# Patient Record
Sex: Female | Born: 1940
Health system: Southern US, Community
[De-identification: ages and names within clinical notes are randomized; demographics above are authoritative.]

## PROBLEM LIST (undated history)

## (undated) DIAGNOSIS — K219 Gastro-esophageal reflux disease without esophagitis: Secondary | ICD-10-CM

## (undated) DIAGNOSIS — E785 Hyperlipidemia, unspecified: Secondary | ICD-10-CM

## (undated) DIAGNOSIS — R7611 Nonspecific reaction to tuberculin skin test without active tuberculosis: Secondary | ICD-10-CM

## (undated) DIAGNOSIS — I10 Essential (primary) hypertension: Secondary | ICD-10-CM

## (undated) DIAGNOSIS — M1990 Unspecified osteoarthritis, unspecified site: Secondary | ICD-10-CM

## (undated) DIAGNOSIS — M7989 Other specified soft tissue disorders: Secondary | ICD-10-CM

## (undated) DIAGNOSIS — E7800 Pure hypercholesterolemia, unspecified: Secondary | ICD-10-CM

## (undated) DIAGNOSIS — H269 Unspecified cataract: Secondary | ICD-10-CM

## (undated) DIAGNOSIS — Z87442 Personal history of urinary calculi: Secondary | ICD-10-CM

## (undated) DIAGNOSIS — Z9289 Personal history of other medical treatment: Secondary | ICD-10-CM

## (undated) DIAGNOSIS — J449 Chronic obstructive pulmonary disease, unspecified: Secondary | ICD-10-CM

## (undated) DIAGNOSIS — J4489 Other specified chronic obstructive pulmonary disease: Secondary | ICD-10-CM

## (undated) DIAGNOSIS — N189 Chronic kidney disease, unspecified: Secondary | ICD-10-CM

## (undated) DIAGNOSIS — T7840XA Allergy, unspecified, initial encounter: Secondary | ICD-10-CM

## (undated) DIAGNOSIS — R7301 Impaired fasting glucose: Secondary | ICD-10-CM

## (undated) DIAGNOSIS — K529 Noninfective gastroenteritis and colitis, unspecified: Secondary | ICD-10-CM

## (undated) DIAGNOSIS — K2270 Barrett's esophagus without dysplasia: Secondary | ICD-10-CM

## (undated) DIAGNOSIS — K635 Polyp of colon: Secondary | ICD-10-CM

## (undated) DIAGNOSIS — M549 Dorsalgia, unspecified: Secondary | ICD-10-CM

## (undated) DIAGNOSIS — Z8619 Personal history of other infectious and parasitic diseases: Secondary | ICD-10-CM

## (undated) HISTORY — DX: Dorsalgia, unspecified: M54.9

## (undated) HISTORY — DX: Personal history of other infectious and parasitic diseases: Z86.19

## (undated) HISTORY — DX: Gastro-esophageal reflux disease without esophagitis: K21.9

## (undated) HISTORY — DX: Other specified soft tissue disorders: M79.89

## (undated) HISTORY — DX: Chronic kidney disease, unspecified: N18.9

## (undated) HISTORY — DX: Polyp of colon: K63.5

## (undated) HISTORY — DX: Essential (primary) hypertension: I10

## (undated) HISTORY — DX: Impaired fasting glucose: R73.01

## (undated) HISTORY — DX: Nonspecific reaction to tuberculin skin test without active tuberculosis: R76.11

## (undated) HISTORY — DX: Personal history of urinary calculi: Z87.442

## (undated) HISTORY — DX: Unspecified osteoarthritis, unspecified site: M19.90

## (undated) HISTORY — DX: Unspecified cataract: H26.9

## (undated) HISTORY — DX: Hyperlipidemia, unspecified: E78.5

## (undated) HISTORY — DX: Allergy, unspecified, initial encounter: T78.40XA

## (undated) HISTORY — PX: FRACTURE SURGERY: SHX138

## (undated) HISTORY — DX: Other specified chronic obstructive pulmonary disease: J44.89

## (undated) HISTORY — PX: CHOLECYSTECTOMY: SHX55

## (undated) HISTORY — DX: Barrett's esophagus without dysplasia: K22.70

## (undated) HISTORY — DX: Personal history of other medical treatment: Z92.89

## (undated) HISTORY — DX: Chronic obstructive pulmonary disease, unspecified: J44.9

## (undated) HISTORY — PX: JOINT REPLACEMENT: SHX530

## (undated) HISTORY — PX: APPENDECTOMY: SHX54

## (undated) HISTORY — PX: EYE SURGERY: SHX253

## (undated) HISTORY — DX: Pure hypercholesterolemia, unspecified: E78.00

---

## 1968-05-14 HISTORY — PX: ABDOMINAL HYSTERECTOMY: SHX81

## 1998-05-10 ENCOUNTER — Other Ambulatory Visit: Admission: RE | Admit: 1998-05-10 | Discharge: 1998-05-10 | Payer: Self-pay | Admitting: Gastroenterology

## 2001-11-11 ENCOUNTER — Ambulatory Visit (HOSPITAL_COMMUNITY): Admission: RE | Admit: 2001-11-11 | Discharge: 2001-11-11 | Payer: Self-pay | Admitting: Family Medicine

## 2001-11-11 ENCOUNTER — Encounter: Payer: Self-pay | Admitting: Family Medicine

## 2004-03-24 ENCOUNTER — Ambulatory Visit: Payer: Self-pay | Admitting: Family Medicine

## 2004-04-28 ENCOUNTER — Encounter (INDEPENDENT_AMBULATORY_CARE_PROVIDER_SITE_OTHER): Payer: Self-pay | Admitting: *Deleted

## 2005-02-19 ENCOUNTER — Ambulatory Visit: Payer: Self-pay | Admitting: Family Medicine

## 2005-06-08 ENCOUNTER — Ambulatory Visit: Payer: Self-pay | Admitting: Family Medicine

## 2005-07-05 ENCOUNTER — Ambulatory Visit: Payer: Self-pay | Admitting: Family Medicine

## 2005-07-26 ENCOUNTER — Ambulatory Visit: Payer: Self-pay | Admitting: Family Medicine

## 2005-08-01 ENCOUNTER — Ambulatory Visit: Payer: Self-pay

## 2005-08-01 ENCOUNTER — Encounter (INDEPENDENT_AMBULATORY_CARE_PROVIDER_SITE_OTHER): Payer: Self-pay | Admitting: *Deleted

## 2005-08-06 ENCOUNTER — Ambulatory Visit: Payer: Self-pay | Admitting: Family Medicine

## 2005-09-18 ENCOUNTER — Ambulatory Visit: Payer: Self-pay | Admitting: Family Medicine

## 2006-01-21 ENCOUNTER — Ambulatory Visit: Payer: Self-pay | Admitting: Family Medicine

## 2006-02-05 ENCOUNTER — Ambulatory Visit: Payer: Self-pay | Admitting: Family Medicine

## 2006-02-06 ENCOUNTER — Ambulatory Visit: Payer: Self-pay | Admitting: Family Medicine

## 2006-02-19 ENCOUNTER — Ambulatory Visit: Payer: Self-pay | Admitting: Family Medicine

## 2006-03-07 ENCOUNTER — Ambulatory Visit: Payer: Self-pay | Admitting: Family Medicine

## 2006-03-19 ENCOUNTER — Ambulatory Visit: Payer: Self-pay | Admitting: Family Medicine

## 2006-04-10 LAB — CONVERTED CEMR LAB: Pap Smear: NORMAL

## 2006-04-13 HISTORY — PX: TOTAL KNEE ARTHROPLASTY: SHX125

## 2006-04-17 ENCOUNTER — Inpatient Hospital Stay (HOSPITAL_COMMUNITY): Admission: RE | Admit: 2006-04-17 | Discharge: 2006-04-20 | Payer: Self-pay | Admitting: Orthopedic Surgery

## 2006-05-10 ENCOUNTER — Ambulatory Visit: Payer: Self-pay | Admitting: Internal Medicine

## 2006-05-14 HISTORY — PX: LEG SURGERY: SHX1003

## 2006-05-31 DIAGNOSIS — Z860100 Personal history of colon polyps, unspecified: Secondary | ICD-10-CM | POA: Insufficient documentation

## 2006-05-31 DIAGNOSIS — M810 Age-related osteoporosis without current pathological fracture: Secondary | ICD-10-CM | POA: Insufficient documentation

## 2006-05-31 DIAGNOSIS — Z87442 Personal history of urinary calculi: Secondary | ICD-10-CM | POA: Insufficient documentation

## 2006-07-04 ENCOUNTER — Ambulatory Visit: Payer: Self-pay | Admitting: Family Medicine

## 2006-07-12 ENCOUNTER — Ambulatory Visit: Payer: Self-pay | Admitting: Family Medicine

## 2006-07-20 ENCOUNTER — Inpatient Hospital Stay (HOSPITAL_COMMUNITY): Admission: EM | Admit: 2006-07-20 | Discharge: 2006-07-24 | Payer: Self-pay | Admitting: Emergency Medicine

## 2006-07-25 ENCOUNTER — Inpatient Hospital Stay (HOSPITAL_COMMUNITY): Admission: EM | Admit: 2006-07-25 | Discharge: 2006-07-29 | Payer: Self-pay | Admitting: Emergency Medicine

## 2006-07-26 ENCOUNTER — Ambulatory Visit: Payer: Self-pay | Admitting: Physical Medicine & Rehabilitation

## 2006-07-29 ENCOUNTER — Inpatient Hospital Stay (HOSPITAL_COMMUNITY)
Admission: RE | Admit: 2006-07-29 | Discharge: 2006-08-07 | Payer: Self-pay | Admitting: Physical Medicine & Rehabilitation

## 2006-08-15 ENCOUNTER — Ambulatory Visit: Payer: Self-pay | Admitting: Family Medicine

## 2006-10-08 ENCOUNTER — Encounter: Payer: Self-pay | Admitting: Family Medicine

## 2006-11-21 ENCOUNTER — Ambulatory Visit: Payer: Self-pay | Admitting: Family Medicine

## 2006-12-10 ENCOUNTER — Inpatient Hospital Stay (HOSPITAL_COMMUNITY): Admission: RE | Admit: 2006-12-10 | Discharge: 2006-12-13 | Payer: Self-pay | Admitting: Orthopedic Surgery

## 2007-01-03 ENCOUNTER — Ambulatory Visit: Payer: Self-pay | Admitting: Family Medicine

## 2007-01-03 LAB — CONVERTED CEMR LAB
Bilirubin Urine: NEGATIVE
Bilirubin Urine: NEGATIVE
Ketones, ur: NEGATIVE mg/dL
Nitrite: NEGATIVE
Urine Glucose: NEGATIVE mg/dL
pH: 6 (ref 5.0–8.0)

## 2007-01-04 ENCOUNTER — Encounter (INDEPENDENT_AMBULATORY_CARE_PROVIDER_SITE_OTHER): Payer: Self-pay | Admitting: Family Medicine

## 2007-03-06 ENCOUNTER — Ambulatory Visit: Payer: Self-pay | Admitting: Internal Medicine

## 2007-03-21 ENCOUNTER — Ambulatory Visit: Payer: Self-pay | Admitting: Internal Medicine

## 2007-03-21 ENCOUNTER — Encounter: Payer: Self-pay | Admitting: Family Medicine

## 2007-04-01 ENCOUNTER — Other Ambulatory Visit: Admission: RE | Admit: 2007-04-01 | Discharge: 2007-04-01 | Payer: Self-pay | Admitting: Family Medicine

## 2007-04-01 ENCOUNTER — Ambulatory Visit: Payer: Self-pay | Admitting: Family Medicine

## 2007-04-01 ENCOUNTER — Encounter: Payer: Self-pay | Admitting: Family Medicine

## 2007-04-01 DIAGNOSIS — I10 Essential (primary) hypertension: Secondary | ICD-10-CM | POA: Insufficient documentation

## 2007-04-07 ENCOUNTER — Encounter (INDEPENDENT_AMBULATORY_CARE_PROVIDER_SITE_OTHER): Payer: Self-pay | Admitting: *Deleted

## 2007-04-14 ENCOUNTER — Telehealth (INDEPENDENT_AMBULATORY_CARE_PROVIDER_SITE_OTHER): Payer: Self-pay | Admitting: *Deleted

## 2007-04-17 ENCOUNTER — Encounter: Payer: Self-pay | Admitting: Family Medicine

## 2007-05-05 ENCOUNTER — Encounter (INDEPENDENT_AMBULATORY_CARE_PROVIDER_SITE_OTHER): Payer: Self-pay | Admitting: *Deleted

## 2007-06-12 ENCOUNTER — Telehealth (INDEPENDENT_AMBULATORY_CARE_PROVIDER_SITE_OTHER): Payer: Self-pay | Admitting: *Deleted

## 2007-06-12 ENCOUNTER — Ambulatory Visit: Payer: Self-pay | Admitting: Family Medicine

## 2007-07-09 ENCOUNTER — Ambulatory Visit: Payer: Self-pay | Admitting: Family Medicine

## 2007-07-22 ENCOUNTER — Telehealth (INDEPENDENT_AMBULATORY_CARE_PROVIDER_SITE_OTHER): Payer: Self-pay | Admitting: *Deleted

## 2007-07-22 LAB — CONVERTED CEMR LAB
AST: 22 units/L (ref 0–37)
Albumin: 3.8 g/dL (ref 3.5–5.2)
Direct LDL: 151.4 mg/dL
HDL: 46.8 mg/dL (ref >39.0)
Total Protein: 6.6 g/dL (ref 6.0–8.3)

## 2007-08-25 ENCOUNTER — Telehealth (INDEPENDENT_AMBULATORY_CARE_PROVIDER_SITE_OTHER): Payer: Self-pay | Admitting: *Deleted

## 2007-08-26 ENCOUNTER — Ambulatory Visit: Payer: Self-pay | Admitting: Family Medicine

## 2007-08-26 LAB — CONVERTED CEMR LAB: Rapid Strep: NEGATIVE

## 2007-08-27 ENCOUNTER — Encounter: Payer: Self-pay | Admitting: Family Medicine

## 2007-09-01 ENCOUNTER — Encounter (INDEPENDENT_AMBULATORY_CARE_PROVIDER_SITE_OTHER): Payer: Self-pay | Admitting: *Deleted

## 2007-09-24 ENCOUNTER — Ambulatory Visit: Payer: Self-pay | Admitting: Internal Medicine

## 2007-09-24 ENCOUNTER — Telehealth (INDEPENDENT_AMBULATORY_CARE_PROVIDER_SITE_OTHER): Payer: Self-pay | Admitting: *Deleted

## 2007-09-24 DIAGNOSIS — K219 Gastro-esophageal reflux disease without esophagitis: Secondary | ICD-10-CM | POA: Insufficient documentation

## 2007-10-09 ENCOUNTER — Ambulatory Visit: Payer: Self-pay | Admitting: Internal Medicine

## 2007-10-09 ENCOUNTER — Encounter: Payer: Self-pay | Admitting: Internal Medicine

## 2007-10-13 ENCOUNTER — Encounter: Payer: Self-pay | Admitting: Internal Medicine

## 2007-10-22 ENCOUNTER — Encounter (INDEPENDENT_AMBULATORY_CARE_PROVIDER_SITE_OTHER): Payer: Self-pay | Admitting: *Deleted

## 2007-10-22 ENCOUNTER — Ambulatory Visit: Payer: Self-pay | Admitting: Family Medicine

## 2007-10-23 ENCOUNTER — Encounter: Payer: Self-pay | Admitting: Family Medicine

## 2007-10-31 ENCOUNTER — Encounter (INDEPENDENT_AMBULATORY_CARE_PROVIDER_SITE_OTHER): Payer: Self-pay | Admitting: *Deleted

## 2008-02-02 ENCOUNTER — Ambulatory Visit: Payer: Self-pay | Admitting: Family Medicine

## 2008-02-05 ENCOUNTER — Telehealth (INDEPENDENT_AMBULATORY_CARE_PROVIDER_SITE_OTHER): Payer: Self-pay | Admitting: *Deleted

## 2008-04-01 ENCOUNTER — Ambulatory Visit: Payer: Self-pay | Admitting: *Deleted

## 2008-04-01 ENCOUNTER — Ambulatory Visit (HOSPITAL_BASED_OUTPATIENT_CLINIC_OR_DEPARTMENT_OTHER): Admission: RE | Admit: 2008-04-01 | Discharge: 2008-04-01 | Payer: Self-pay | Admitting: *Deleted

## 2008-04-01 DIAGNOSIS — E7800 Pure hypercholesterolemia, unspecified: Secondary | ICD-10-CM | POA: Insufficient documentation

## 2008-04-01 DIAGNOSIS — Z87891 Personal history of nicotine dependence: Secondary | ICD-10-CM | POA: Insufficient documentation

## 2008-04-01 DIAGNOSIS — E782 Mixed hyperlipidemia: Secondary | ICD-10-CM | POA: Insufficient documentation

## 2008-04-01 DIAGNOSIS — R32 Unspecified urinary incontinence: Secondary | ICD-10-CM | POA: Insufficient documentation

## 2008-04-01 DIAGNOSIS — K2270 Barrett's esophagus without dysplasia: Secondary | ICD-10-CM | POA: Insufficient documentation

## 2008-04-01 DIAGNOSIS — M1990 Unspecified osteoarthritis, unspecified site: Secondary | ICD-10-CM | POA: Insufficient documentation

## 2008-04-15 ENCOUNTER — Ambulatory Visit: Payer: Self-pay | Admitting: *Deleted

## 2008-04-19 ENCOUNTER — Ambulatory Visit: Payer: Self-pay | Admitting: *Deleted

## 2008-05-06 ENCOUNTER — Ambulatory Visit: Payer: Self-pay | Admitting: Internal Medicine

## 2008-05-19 ENCOUNTER — Ambulatory Visit: Payer: Self-pay | Admitting: *Deleted

## 2008-05-20 LAB — CONVERTED CEMR LAB
BUN: 12 mg/dL (ref 6–23)
CO2: 28 meq/L (ref 19–32)
Calcium: 9.6 mg/dL (ref 8.4–10.5)
Creatinine, Ser: 0.9 mg/dL (ref 0.4–1.2)
GFR calc non Af Amer: 66 mL/min
Glucose, Bld: 114 mg/dL — ABNORMAL HIGH (ref 70–99)
Sodium: 138 meq/L (ref 135–145)

## 2008-05-27 ENCOUNTER — Ambulatory Visit: Payer: Self-pay | Admitting: Critical Care Medicine

## 2008-05-27 ENCOUNTER — Encounter: Payer: Self-pay | Admitting: Critical Care Medicine

## 2008-06-10 ENCOUNTER — Ambulatory Visit (HOSPITAL_BASED_OUTPATIENT_CLINIC_OR_DEPARTMENT_OTHER): Admission: RE | Admit: 2008-06-10 | Discharge: 2008-06-10 | Payer: Self-pay | Admitting: *Deleted

## 2008-06-10 ENCOUNTER — Ambulatory Visit: Payer: Self-pay | Admitting: *Deleted

## 2008-06-10 ENCOUNTER — Ambulatory Visit: Payer: Self-pay | Admitting: Diagnostic Radiology

## 2008-06-10 LAB — CONVERTED CEMR LAB
AST: 32 units/L (ref 0–37)
BUN: 15 mg/dL (ref 6–23)
Basophils Absolute: 0.1 10*3/uL (ref 0.0–0.1)
Basophils Relative: 0.7 % (ref 0.0–3.0)
Calcium: 9.7 mg/dL (ref 8.4–10.5)
Eosinophils Absolute: 0.4 10*3/uL (ref 0.0–0.7)
GFR calc Af Amer: 64 mL/min
HDL: 60.3 mg/dL (ref >39.0)
Hemoglobin: 12.1 g/dL (ref 12.0–15.0)
MCHC: 33.4 g/dL (ref 30.0–36.0)
MCV: 86.4 fL (ref 78.0–100.0)
Monocytes Absolute: 0.6 10*3/uL (ref 0.1–1.0)
Neutro Abs: 5 10*3/uL (ref 1.4–7.7)
Neutrophils Relative %: 58.1 % (ref 43.0–77.0)
Potassium: 4.4 meq/L (ref 3.5–5.1)
Sodium: 139 meq/L (ref 135–145)
TSH: 2.25 microintl units/mL (ref 0.35–5.50)
Total Bilirubin: 0.6 mg/dL (ref 0.3–1.2)
VLDL: 11 mg/dL (ref 0–40)

## 2008-06-24 ENCOUNTER — Ambulatory Visit: Payer: Self-pay | Admitting: Critical Care Medicine

## 2008-08-05 ENCOUNTER — Ambulatory Visit: Payer: Self-pay | Admitting: Critical Care Medicine

## 2008-09-14 ENCOUNTER — Ambulatory Visit: Payer: Self-pay | Admitting: Family Medicine

## 2008-10-28 ENCOUNTER — Ambulatory Visit: Payer: Self-pay | Admitting: Critical Care Medicine

## 2008-12-14 ENCOUNTER — Ambulatory Visit: Payer: Self-pay | Admitting: Family Medicine

## 2008-12-15 ENCOUNTER — Encounter: Payer: Self-pay | Admitting: Family Medicine

## 2009-03-28 ENCOUNTER — Ambulatory Visit: Payer: Self-pay | Admitting: Family

## 2009-03-30 ENCOUNTER — Telehealth: Payer: Self-pay | Admitting: Family Medicine

## 2009-03-31 ENCOUNTER — Encounter (INDEPENDENT_AMBULATORY_CARE_PROVIDER_SITE_OTHER): Payer: Self-pay | Admitting: *Deleted

## 2009-06-21 ENCOUNTER — Ambulatory Visit: Payer: Self-pay | Admitting: Family

## 2009-06-21 ENCOUNTER — Ambulatory Visit (HOSPITAL_BASED_OUTPATIENT_CLINIC_OR_DEPARTMENT_OTHER): Admission: RE | Admit: 2009-06-21 | Discharge: 2009-06-21 | Payer: Self-pay | Admitting: Internal Medicine

## 2009-06-21 ENCOUNTER — Ambulatory Visit: Payer: Self-pay | Admitting: Diagnostic Radiology

## 2009-06-22 ENCOUNTER — Telehealth (INDEPENDENT_AMBULATORY_CARE_PROVIDER_SITE_OTHER): Payer: Self-pay | Admitting: *Deleted

## 2009-06-23 ENCOUNTER — Ambulatory Visit: Payer: Self-pay | Admitting: Family

## 2009-06-23 LAB — CONVERTED CEMR LAB
Basophils Relative: 0 % (ref 0–1)
CO2: 25 meq/L (ref 19–32)
Eosinophils Relative: 7 % — ABNORMAL HIGH (ref 0–5)
Glucose, Bld: 103 mg/dL — ABNORMAL HIGH (ref 70–99)
Hgb A1c MFr Bld: 6.8 % — ABNORMAL HIGH (ref 4.6–6.1)
LDL Cholesterol: 201 mg/dL — ABNORMAL HIGH (ref 0–99)
Lymphs Abs: 3 10*3/uL (ref 0.7–4.0)
MCV: 83.5 fL (ref 78.0–100.0)
Monocytes Relative: 7 % (ref 3–12)
Neutro Abs: 6.4 10*3/uL (ref 1.7–7.7)
Neutrophils Relative %: 59 % (ref 43–77)
Platelets: 542 10*3/uL — ABNORMAL HIGH (ref 150–400)
Potassium: 4.4 meq/L (ref 3.5–5.3)
RDW: 14.1 % (ref 11.5–15.5)
Sodium: 138 meq/L (ref 135–145)
Total CHOL/HDL Ratio: 5.7
VLDL: 33 mg/dL (ref 0–40)
WBC: 10.9 10*3/uL — ABNORMAL HIGH (ref 4.0–10.5)

## 2009-06-25 ENCOUNTER — Encounter: Payer: Self-pay | Admitting: Family

## 2009-07-05 ENCOUNTER — Ambulatory Visit: Payer: Self-pay | Admitting: Family

## 2009-07-07 ENCOUNTER — Ambulatory Visit (HOSPITAL_BASED_OUTPATIENT_CLINIC_OR_DEPARTMENT_OTHER): Admission: RE | Admit: 2009-07-07 | Discharge: 2009-07-07 | Payer: Self-pay | Admitting: Internal Medicine

## 2009-07-07 ENCOUNTER — Ambulatory Visit: Payer: Self-pay | Admitting: Diagnostic Radiology

## 2009-07-07 ENCOUNTER — Telehealth: Payer: Self-pay | Admitting: Family

## 2009-07-18 ENCOUNTER — Encounter: Payer: Self-pay | Admitting: Internal Medicine

## 2009-07-18 ENCOUNTER — Ambulatory Visit: Payer: Self-pay | Admitting: Internal Medicine

## 2009-08-25 ENCOUNTER — Telehealth (INDEPENDENT_AMBULATORY_CARE_PROVIDER_SITE_OTHER): Payer: Self-pay | Admitting: *Deleted

## 2009-10-17 ENCOUNTER — Ambulatory Visit: Payer: Self-pay | Admitting: Family

## 2009-10-18 ENCOUNTER — Encounter: Payer: Self-pay | Admitting: Family

## 2009-10-18 LAB — CONVERTED CEMR LAB
CO2: 23 meq/L (ref 19–32)
Calcium: 9 mg/dL (ref 8.4–10.5)
Creatinine, Ser: 0.74 mg/dL (ref 0.40–1.20)
Glucose, Bld: 101 mg/dL — ABNORMAL HIGH (ref 70–99)
Sodium: 136 meq/L (ref 135–145)
Triglycerides: 188 mg/dL — ABNORMAL HIGH (ref <150)

## 2009-10-19 ENCOUNTER — Telehealth: Payer: Self-pay | Admitting: Family

## 2009-10-20 ENCOUNTER — Telehealth: Payer: Self-pay | Admitting: Family

## 2010-02-28 ENCOUNTER — Encounter: Payer: Self-pay | Admitting: Family

## 2010-03-27 ENCOUNTER — Ambulatory Visit: Payer: Self-pay | Admitting: Family

## 2010-03-27 LAB — CONVERTED CEMR LAB
BUN: 10 mg/dL (ref 6–23)
Sodium: 135 meq/L (ref 135–145)

## 2010-03-28 ENCOUNTER — Encounter: Payer: Self-pay | Admitting: Family

## 2010-04-05 ENCOUNTER — Telehealth: Payer: Self-pay | Admitting: Family

## 2010-04-05 LAB — CONVERTED CEMR LAB
Cholesterol: 331 mg/dL — ABNORMAL HIGH (ref 0–200)
HDL: 62 mg/dL (ref >39)

## 2010-04-10 ENCOUNTER — Telehealth: Payer: Self-pay | Admitting: Family

## 2010-05-16 ENCOUNTER — Telehealth (INDEPENDENT_AMBULATORY_CARE_PROVIDER_SITE_OTHER): Payer: Self-pay | Admitting: *Deleted

## 2010-05-23 ENCOUNTER — Encounter: Payer: Self-pay | Admitting: Internal Medicine

## 2010-05-31 ENCOUNTER — Telehealth: Payer: Self-pay | Admitting: Family

## 2010-06-02 ENCOUNTER — Encounter: Payer: Self-pay | Admitting: Family

## 2010-06-02 ENCOUNTER — Encounter: Payer: Self-pay | Admitting: Internal Medicine

## 2010-06-11 LAB — CONVERTED CEMR LAB
ALT: 18 units/L (ref 0–35)
Albumin: 3.9 g/dL (ref 3.5–5.2)
Albumin: 4.2 g/dL (ref 3.5–5.2)
Alkaline Phosphatase: 75 units/L (ref 39–117)
BUN: 7 mg/dL (ref 6–23)
Basophils Absolute: 0.1 10*3/uL (ref 0.0–0.1)
Bilirubin Urine: NEGATIVE
Blood in Urine, dipstick: NEGATIVE
CO2: 27 meq/L (ref 19–32)
Calcium: 9.3 mg/dL (ref 8.4–10.5)
Chloride: 100 meq/L (ref 96–112)
Direct LDL: 225.7 mg/dL
Eosinophils Absolute: 0.2 10*3/uL (ref 0.0–0.6)
Eosinophils Relative: 3.1 % (ref 0.0–5.0)
GFR calc Af Amer: 129 mL/min
Glucose, Bld: 102 mg/dL — ABNORMAL HIGH (ref 70–99)
HDL: 56.5 mg/dL (ref >39.0)
Lymphocytes Relative: 31.8 % (ref 12.0–46.0)
MCHC: 34 g/dL (ref 30.0–36.0)
Monocytes Absolute: 0.4 10*3/uL (ref 0.2–0.7)
Neutrophils Relative %: 59.3 % (ref 43.0–77.0)
Protein, U semiquant: NEGATIVE
RBC: 4.12 M/uL (ref 3.87–5.11)
Total Bilirubin: 0.6 mg/dL (ref 0.3–1.2)
Total Bilirubin: 0.6 mg/dL (ref 0.3–1.2)
WBC: 7.6 10*3/uL (ref 4.5–10.5)

## 2010-06-13 NOTE — Assessment & Plan Note (Signed)
Summary: HURT FOOT/MHF   Vital Signs:  Patient profile:   70 year old female Weight:      172 pounds BMI:     31.57 O2 Sat:      97 % on Room air Temp:     98.0 degrees F oral Pulse rate:   102 / minute Pulse rhythm:   regular Resp:     18 per minute BP sitting:   122 / 80  (right arm) Cuff size:   large  Vitals Entered By: Glendell Docker CMA (June 21, 2009 1:32 PM)  O2 Flow:  Room air  Primary Care Provider:  Seymour Bars DO  CC:  Right Foot Pain.  History of Present Illness: c/o right foot pain for the past 2 weeks, she tripped and fell in her garage. She notes that she stubbed her toe during this fall but denies knowingly injuring her right foot,.She states it is a sharp pain when she walks, and when pressure is relieved there is a throbbing sensation.  Notes that pain has worsened each day.  She notes that her foot swelled over the weekend, but not now.  Also has used some redness on the top of her foot.  She used ibuprofen which has helped her pain.  Contraindications/Deferment of Procedures/Staging:    Test/Procedure: PAP Smear    Reason for deferment: hysterectomy   Allergies: 1)  ! Crestor (Rosuvastatin Calcium) 2)  ! Lipitor (Atorvastatin Calcium) 3)  ! * Tb Test 4)  ! Arthrotec 50 (Diclofenac-Misoprostol) 5)  ! Sulfa 6)  ! * Statins 7)  ! Tricor (Fenofibrate) 8)  Augmentin (Amoxicillin-Pot Clavulanate) 9)  Penicillin G Potassium (Penicillin G Potassium)  Physical Exam  General:  Well-developed,well-nourished,in no acute distress; alert,appropriate and cooperative throughout examination Extremities:  right foot tender to the touch at base of 2nd and 3rd toes.  mild associated erythema, no significant swelling noted.  No increased pain with rom of ankle or right great toe   Impression & Recommendations:  Problem # 1:  FOOT PAIN, RIGHT (ICD-729.5) x-ray negative for acute fracture, but dose note old 4th metatarsal fracture,  plan NSAIDS and will give  empiric trial of colchicine for possible gout.   Left message on patient's machine with her permission RE: x-ray results and colchicine plans. Orders: T-Foot Right ([id number]) Prescription Created Electronically [phone])  Complete Medication List: 1)  Quinapril-hydrochlorothiazide 20-25 Mg Tabs (Quinapril-hydrochlorothiazide) .... Take 1 tablet by mouth once a day 2)  Potassium Chloride Cr 10 Meq Cpcr (Potassium chloride) .... Take 1 tablet by mouth once a day 3)  Omeprazole 20 Mg Tbec (Omeprazole) .[name].. 1 by mouth two times a day 4)  Bayer Aspirin 325 Mg Tabs (Aspirin) .[name].. 1 by mouth once daily 5)  Ibuprofen 200 Mg Caps (Ibuprofen) .... Take 1 tab as needed for pain 6)  Vitamin E 400 Unit Caps (Vitamin e) .... Take 1 capsule by mouth once a day 7)  Calcium 600 Mg Tabs (Calcium) .... Take 1 tablet by mouth two times a day 8)  Multivitamins Tabs (Multiple vitamin) .... Take 1 tablet by mouth once a day 9)  Ra Beta Carotene 15 Mg Caps (Beta carotene) .... Take 1 capsule by mouth once a day 10)  Vitamin C 1000 Mg Tabs (Ascorbic acid) .... Take 1 tablet by mouth once a day 11)  Co Q-10 200 Mg Caps (Coenzyme q10) .... Take 1 tablet by mouth once a day 12)  Fish Oil Oil (Fish oil) .... Take  1 tablet by mouth once a day 13)  Calcitonin (salmon) 200 Unit/act Soln (Calcitonin (salmon)) .[name].. 1 spray once daily - alternate nostrils 14)  Fluticasone Propionate 50 Mcg/act Susp (Fluticasone propionate) .... Two sprays each nostril once daily 15)  Colchicine 0.6 Mg Tabs (Colchicine) .... One tab by mouth two times a day for 1-2 weeks or until pain is improved  Patient Instructions: 1)  Complete your x-ray downstairs today 2)  Take 400-600mg  of Ibuprofen (Advil, Motrin) with food every 4-6 hours as needed for relief of pain or comfort of fever. 3)  Please make an appointment for a complete physical. 4)  Please return fasting for the following labs prior to your physical: 5)  CBC v70 6)  BMET 790.21 7)  a1c  790.21 8)  FLP 272 Prescriptions: COLCHICINE 0.6 MG TABS (COLCHICINE) one tab by mouth two times a day for 1-2 weeks or until pain is improved  #24 x 0   Entered and Authorized by:   Lemont Fillers FNP   Signed by:   Lemont Fillers FNP on 06/21/2009   Method used:   Electronically to        [facility] [phone]* (retail)       [address]       [place], [place]  [address]       Ph: [phone]       Fax: [phone]   RxID:   [id number]   Current Allergies (reviewed today): ! CRESTOR (ROSUVASTATIN CALCIUM) ! LIPITOR (ATORVASTATIN CALCIUM) ! * TB TEST ! ARTHROTEC 50 (DICLOFENAC-MISOPROSTOL) ! SULFA ! * STATINS ! TRICOR (FENOFIBRATE) AUGMENTIN (AMOXICILLIN-POT CLAVULANATE) PENICILLIN G POTASSIUM (PENICILLIN G POTASSIUM)   Immunization History:  Influenza Immunization History:    Influenza:  historical (01/31/2009)    Preventive Care Screening  Pap Smear:    Date:  06/21/2009    Results:  Declined  Last Flu Shot:    Date:  01/31/2009    Results:  Historical

## 2010-06-13 NOTE — Letter (Signed)
   [place] at [facility] [address] [place], [place]  [address]  [place] Phone: [phone]      June 25, 2009   [facility] [address] [place], [place] [address]  RE:  LAB RESULTS  Dear  Ms. Hudon,  The following is an interpretation of your most recent lab tests.  Please take note of any instructions provided or changes to medications that have resulted from your lab work.  ELECTROLYTES:  Good - no changes needed  KIDNEY FUNCTION TESTS:  Good - no changes needed  LIVER FUNCTION TESTS:  Good - no changes needed  LIPID PANEL:  Abnormal - schedule a follow-up appointment Triglyceride: 166   Cholesterol: 284   LDL: 201   HDL: 50   Chol/HDL%:  5.7 Ratio  THYROID STUDIES:  Thyroid studies normal TSH: 2.25     DIABETIC STUDIES:  Fair - schedule a follow-up appointment Blood Glucose: 103   HgbA1C: 6.8     CBC:  Fair - review at your next visit  Please keep your upcoming appointment- we can discuss the above results at your visit.   Sincerely Yours,    Lemont Fillers FNP

## 2010-06-13 NOTE — Letter (Signed)
   [place] at [facility] [address] [place], [place]  [address]  [place] Phone: [phone]      March 28, 2010   [facility] [address] [place], [place] [address]  RE:  LAB RESULTS  Dear  Ms. Barnette,  The following is an interpretation of your most recent lab tests.  Please take note of any instructions provided or changes to medications that have resulted from your lab work.  ELECTROLYTES:  Good - no changes needed  KIDNEY FUNCTION TESTS:  Good - no changes needed    DIABETIC STUDIES:  Good - no changes needed Blood Glucose: 99   HgbA1C: 6.1      Sincerely Yours,    Lemont Fillers FNP  Appended Document:  Mailed.

## 2010-06-13 NOTE — Progress Notes (Signed)
Summary: Niacin  Phone Note Outgoing Call   Summary of Call: Pls let patient know that I have sent Rx to pharmacy. Initial call taken by: Lemont Fillers FNP,  October 20, 2009 8:06 AM  Follow-up for Phone Call        Left detailed message on home phone re: rx per HIPPA auth. Advised pt. to call if any questions.  Nicki Guadalajara Fergerson CMA  October 20, 2009 10:21 AM     New/Updated Medications: NIACIN 500 MG TABS (NIACIN) one tablet by mouth at bedtime x 1 week, then 2 tabs at bedtime second week, then 3 tabs at bedtime starting on third week. Prescriptions: NIACIN 500 MG TABS (NIACIN) one tablet by mouth at bedtime x 1 week, then 2 tabs at bedtime second week, then 3 tabs at bedtime starting on third week.  #90 x 1   Entered and Authorized by:   Lemont Fillers FNP   Signed by:   Lemont Fillers FNP on 10/20/2009   Method used:   [facility]ronically to        [facility] [phone]* (retail)       [address]       [place], [place]  [address]       Ph: [phone]       Fax: [phone]   RxID:   [id number]

## 2010-06-13 NOTE — Miscellaneous (Signed)
Summary: BONE DENSITY  Clinical Lists Changes  Orders: Added new Test order of T-Bone Densitometry (77080) - Signed Added new Test order of T-Lumbar Vertebral Assessment (77082) - Signed 

## 2010-06-13 NOTE — Progress Notes (Signed)
  Phone Note Other Incoming   Caller: Patient Details for Reason: Return call. Summary of Call: Spoke with patient, reviewed elevated cholesterol.  She reports that she is now taking second dose of welchol and continues niacin.  Will give trial of zetia.  Pt in donut hole, will leave samples for pt to pick up.  She is aware. lot [id number] exp 06/13 Initial call taken by: Lemont Fillers FNP,  April 10, 2010 3:42 PM    New/Updated Medications: ZETIA 10 MG TABS (EZETIMIBE) one tablet by mouth once daily Prescriptions: ZETIA 10 MG TABS (EZETIMIBE) one tablet by mouth once daily  #42 x 0   Entered and Authorized by:   Lemont Fillers FNP   Signed by:   Lemont Fillers FNP on 04/10/2010   Method used:   Samples Given   RxID:   [id number]

## 2010-06-13 NOTE — Assessment & Plan Note (Signed)
Summary: 3 MONTH FU RX REFILLS/DT--rm  5   Vital Signs:  Patient profile:   70 year old female Height:      62 inches Weight:      167.50 pounds BMI:     30.75 Temp:     98.1 degrees F oral Pulse rate:   90 / minute Pulse rhythm:   regular Resp:     16 per minute BP sitting:   142 / 84  (right arm) Cuff size:   regular  Vitals Entered By: Mervin Kung CMA (October 17, 2009 1:24 PM) CC: room 5  3 month follow up. Pt states she only takes Omeprazole once a day. Needs refills sent to mail order for Fluticasone, Quinapril-hctz, Potassium and Welchol.  Would like local rx for Calcitonin. Is Patient Diabetic? No   Primary Care Provider:  Seymour Bars DO  CC:  room 5  3 month follow up. Pt states she only takes Omeprazole once a day. Needs refills sent to mail order for Fluticasone, Quinapril-hctz, and Potassium and Welchol.  Would like local rx for Calcitonin.[name]  History of Present Illness: [name] is a 70 year old female who presents today for follow up.  She has no specific complaints although  she does note some + weight gain.    Hyperlipidemia- she reports that she is tolerating Wellbutrin without difficulty.  HTN- reports + compliance with meds.  She denies swelling  Allergies: 1)  ! Crestor (Rosuvastatin Calcium) 2)  ! Lipitor (Atorvastatin Calcium) 3)  ! * Tb Test 4)  ! Arthrotec 50 (Diclofenac-Misoprostol) 5)  ! Sulfa 6)  ! * Statins 7)  ! Tricor (Fenofibrate) 8)  ! * Pcn 9)  Augmentin (Amoxicillin-Pot Clavulanate) 10)  Penicillin G Potassium (Penicillin G Potassium)  Past History:  Past Medical History: Last updated: 09/14/2008 Colonic polyps, hx of Nephrolithiasis, hx of Osteoporosis - patient cannot tolerate fosamax Hypertension (03/2003) Hyperlipidemia (03/2003) chicken pox postivie TB skin test   -exposure to 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  Pos PPD only  neg CXR blood transfusion Urinary incontinence Osteoarthritis barret's esophagus with GERD chronic  obstructive bronchitis (Dr Delford Field) IFG  Past Surgical History: Last updated: 05/06/2008 Hysterectomy (1970) Appendectomy Cholecystectomy Total left knee replacement 04/2006 right leg 2008   Family History: Last updated: 05/27/2008 Family History of CAD Female 1st degree relative <60 Family History of CAD Female 1st degree relative <50 Family History High cholesterol Family History Hypertension Family History of  arthritis Family History Asthma grandmother and mother Family History Tuberculosis grandmother only   Social History: Last updated: 05/06/2008 Retired--[employer] widow Former Smoker quit in 1969 Alcohol use-no Drug use-no Regular exercise-yes   Risk Factors: Caffeine Use: 0 (04/01/2007) Exercise: yes (04/01/2007)  Risk Factors: Smoking Status: quit (07/05/2009) Passive Smoke Exposure: yes (05/27/2008)  Physical Exam  General:  Well-developed,well-nourished,in no acute distress; alert,appropriate and cooperative throughout examination Head:  Normocephalic and atraumatic without obvious abnormalities. No apparent alopecia or balding. Lungs:  Normal respiratory effort, chest expands symmetrically. Lungs are clear to auscultation, no crackles or wheezes. Heart:  Normal rate and regular rhythm. S1 and S2 normal without gallop, murmur, click, rub or other extra sounds. Extremities:  No clubbing, cyanosis, edema, or deformity noted with normal full range of motion of all joints.     Impression & Recommendations:  Problem # 1:  IMPAIRED FASTING GLUCOSE (ICD-790.21) Assessment Comment Only Will check A1C to r/o DM Future Orders: T- Hemoglobin A1C ([id number]) ... 10/18/2009  Problem # 2:  OSTEOPOROSIS (ICD-733.00) Assessment: Improved Patient's Dexa noted only osteopenia- which according to patient is improved.  Old records are unavailable for review.  Plan to continue calcitonin and will also check a vitamin D level. Her updated medication list  for this problem includes:    Calcitonin (salmon) 200 Unit/act Soln (Calcitonin (salmon)) .[name]... 1 spray once daily - alternate nostrils  Orders: T-Assay of Vitamin D ([id number])  Problem # 3:  HYPERCHOLESTEROLEMIA (ICD-272.0) Patient to return fasting for FLP Her updated medication list for this problem includes:    Welchol 625 Mg Tabs (Colesevelam hcl) .Marland KitchenMarland KitchenMarland KitchenMarland Kitchen 3 tabs by mouth two times a day  Future Orders: T-Lipid Profile ([id number]) ... 10/18/2009  Complete Medication List: 1)  Quinapril-hydrochlorothiazide 20-25 Mg Tabs (Quinapril-hydrochlorothiazide) .... Take 1 tablet by mouth once a day 2)  Potassium Chloride Cr 10 Meq Cpcr (Potassium chloride) .... Take 1 tablet by mouth once a day 3)  Omeprazole 20 Mg Tbec (Omeprazole) .[name].. 1 by mouth two times a day 4)  Bayer Aspirin 325 Mg Tabs (Aspirin) .[name].. 1 by mouth once daily 5)  Ibuprofen 200 Mg Caps (Ibuprofen) .... Take 1 tab as needed for pain 6)  Vitamin E 400 Unit Caps (Vitamin e) .... Take 1 capsule by mouth once a day 7)  Calcium 600 Mg Tabs (Calcium) .... Take 1 tablet by mouth two times a day 8)  Multivitamins Tabs (Multiple vitamin) .... Take 1 tablet by mouth once a day 9)  Ra Beta Carotene 15 Mg Caps (Beta carotene) .... Take 1 capsule by mouth once a day 10)  Vitamin C 1000 Mg Tabs (Ascorbic acid) .... Take 1 tablet by mouth once a day 11)  Co Q-10 200 Mg Caps (Coenzyme q10) .... Take 1 tablet by mouth once a day 12)  Fish Oil Oil (Fish oil) .... Take 1 tablet by mouth once a day 13)  Calcitonin (salmon) 200 Unit/act Soln (Calcitonin (salmon)) .[name].. 1 spray once daily - alternate nostrils 14)  Fluticasone Propionate 50 Mcg/act Susp (Fluticasone propionate) .... Two sprays each nostril once daily 15)  Welchol 625 Mg Tabs (Colesevelam hcl) .... 3 tabs by mouth two times a day 16)  Magnesium 500 Mg Tabs (Magnesium) .... One tablet by mouth daily 17)  Align Caps (Probiotic product) .... One cap by mouth daily  Other  Orders: Future Orders: T-Basic Metabolic Panel [id number]) ... 10/18/2009  Patient Instructions: 1)  Please return fasting for the following labs: BMET (401.9),  Fasting lipid profile (272.0) and A1C (790.21).   2)  Follow up in 3 months. 3)  Have a nice Summer! Prescriptions: CALCITONIN (SALMON) 200 UNIT/ACT SOLN (CALCITONIN (SALMON)) 1 spray once daily - alternate nostrils  #1 x 5   Entered and Authorized by:   Lemont Fillers FNP   Signed by:   Lemont Fillers FNP on 10/17/2009   Method used:   [facility] to        [facility] [phone]* (retail)       [address]       [place], [place]  [address]       Ph: [phone]       Fax: [phone]   RxID:   [id number] WELCHOL 625 MG TABS (COLESEVELAM HCL) 3 tabs by mouth two times a day  #180 Tablet x 1   Entered and Authorized by:   Lemont Fillers FNP   Signed by:   Lemont Fillers FNP on 10/17/2009  Method used:   Print then Give to Patient   RxID:   [id number] FLUTICASONE PROPIONATE 50 MCG/ACT SUSP (FLUTICASONE PROPIONATE) two sprays each nostril once daily  #3 x 1   Entered and Authorized by:   Lemont Fillers FNP   Signed by:   Lemont Fillers FNP on 10/17/2009   Method used:   Print then Give to Patient   RxID:   [id number] OMEPRAZOLE 20 MG  TBEC (OMEPRAZOLE) 1 by mouth two times a day  #180 x 1   Entered and Authorized by:   Lemont Fillers FNP   Signed by:   Lemont Fillers FNP on 10/17/2009   Method used:   Print then Give to Patient   RxID:   [id number] POTASSIUM CHLORIDE CR 10 MEQ  CPCR (POTASSIUM CHLORIDE) Take 1 tablet by mouth once a day  #90 x 1   Entered and Authorized by:   Lemont Fillers FNP   Signed by:   Lemont Fillers FNP on 10/17/2009   Method used:   Print then Give to Patient   RxID:   [id number] QUINAPRIL-HYDROCHLOROTHIAZIDE 20-25 MG TABS (QUINAPRIL-HYDROCHLOROTHIAZIDE) Take 1 tablet by  mouth once a day  #90 x 1   Entered and Authorized by:   Lemont Fillers FNP   Signed by:   Lemont Fillers FNP on 10/17/2009   Method used:   Print then Give to Patient   RxID:   [id number]   Current Allergies (reviewed today): ! CRESTOR (ROSUVASTATIN CALCIUM) ! LIPITOR (ATORVASTATIN CALCIUM) ! * TB TEST ! ARTHROTEC 50 (DICLOFENAC-MISOPROSTOL) ! SULFA ! * STATINS ! TRICOR (FENOFIBRATE) ! * PCN AUGMENTIN (AMOXICILLIN-POT CLAVULANATE) PENICILLIN G POTASSIUM (PENICILLIN G POTASSIUM)

## 2010-06-13 NOTE — Progress Notes (Signed)
Summary: HEDIS records request  Phone Note Other Incoming   Request: Send information Summary of Call: Request for records recieved from [facility]. Request forwarded to [employer].    Received HEDIS request. Processed and mailed 08/31/09. Rene Kocher Flowers  August 31, 2009 11:05 AM

## 2010-06-13 NOTE — Letter (Signed)
Summary: Generic Letter  [place] at [facility]  [address]   [place], [place] [address]   Phone: [phone]  Fax: [phone]    02/28/2010  [facility] [address] [place], [place]  [address]  Dear Ms. Morad,   I recently reviewed your chart and see that you are due for a follow up appointment.  Please call our office at your earliest convenience to schedule this appointment.  I hope that you are well.     Sincerely,   Sandford Craze FNP  Appended Document: Generic Letter Mailed.

## 2010-06-13 NOTE — Assessment & Plan Note (Signed)
Summary: cpx/mhf   Vital Signs:  Patient profile:   70 year old female Weight:      176 pounds BMI:     32.31 Temp:     97.8 degrees F oral Pulse rate:   108 / minute Pulse rhythm:   regular Resp:     16 per minute BP sitting:   134 / 78  (right arm) Cuff size:   large  Vitals Entered By: Pearletha Furl CMA (July 05, 2009 3:00 PM) CC: room 4 Annual physical Is Patient Diabetic? No   Primary Care Provider:  Seymour Bars DO  CC:  room 4 Annual physical.  History of Present Illness: [name] is a 70 year old female who presents today for her yearly exam.  Notes since her last visit here she has seen orthopedics for her right foot and was diagnosed with a stress fracture  Preventatitve- notes last dexa was two years ago.  declines Pap, due for mammogram.  Notes she exercises regulary does resistance training.  walks. Offered zostavax- declines.  HTN- does not watch sodium very carefully.    Osteoporosis- new stress fracture, due for dexa.     GERD/Barrett's esophagus- notes that her symptoms are diet related, feels like weight gain worsenes her symptoms.      Preventive Screening-Counseling & Management  Alcohol-Tobacco     Smoking Status: quit  Allergies: 1)  ! Crestor (Rosuvastatin Calcium) 2)  ! Lipitor (Atorvastatin Calcium) 3)  ! * Tb Test 4)  ! Arthrotec 50 (Diclofenac-Misoprostol) 5)  ! Sulfa 6)  ! * Statins 7)  ! Tricor (Fenofibrate) 8)  ! * Pcn 9)  Augmentin (Amoxicillin-Pot Clavulanate) 10)  Penicillin G Potassium (Penicillin G Potassium)  Past History:  Past Medical History: Last updated: 09/14/2008 Colonic polyps, hx of Nephrolithiasis, hx of Osteoporosis - patient cannot tolerate fosamax Hypertension (03/2003) Hyperlipidemia (03/2003) chicken pox postivie TB skin test   -exposure to 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  Pos PPD only  neg CXR blood transfusion Urinary incontinence Osteoarthritis barret's esophagus with GERD chronic obstructive  bronchitis (Dr Delford Field) [facility]  Past Surgical History: Last updated: 05/06/2008 Hysterectomy (1970) Appendectomy Cholecystectomy Total left knee replacement 04/2006 right leg 2008   Family History: Last updated: 05/27/2008 Family History of CAD Female 1st degree relative <60 Family History of CAD Female 1st degree relative <50 Family History High cholesterol Family History Hypertension Family History of  arthritis Family History Asthma grandmother and mother Family History Tuberculosis grandmother only   Social History: Last updated: 05/06/2008 Retired--[employer] widow Former Smoker quit in 1969 Alcohol use-no Drug use-no Regular exercise-yes   Risk Factors: Caffeine Use: 0 (04/01/2007) Exercise: yes (04/01/2007)  Risk Factors: Smoking Status: quit (07/05/2009) Passive Smoke Exposure: yes (05/27/2008)  Review of Systems       Constitutional: Denies Fever ENT:  Denies nasal congestion or sore throat. Resp: Denies cough CV:  Denies Chest Pain or SOB GI:  Denies nausea or vomitting GU: Denies dysuria Lymphatic: Denies lymphadenopathy Musculoskeletal:  Denies muscle/joint pain, + right foot pain Skin:  Denies Rashes Psychiatric: Denies depression Neuro: Denies numbness     Physical Exam  General:  Well-developed,well-nourished,in no acute distress; alert,appropriate and cooperative throughout examination Head:  Normocephalic and atraumatic without obvious abnormalities. No apparent alopecia or balding. Eyes:  Perrla Ears:  External ear exam shows no significant lesions or deformities.  Otoscopic examination reveals clear canals, tympanic membranes are intact bilaterally without bulging, retraction, inflammation or discharge. Hearing is grossly normal bilaterally. Mouth:  Oral mucosa and oropharynx without lesions or exudates.  Teeth in good repair. Neck:  No deformities, masses, or tenderness noted. Breasts:  declined Lungs:  Normal respiratory  effort, chest expands symmetrically. Lungs are clear to auscultation, no crackles or wheezes. Heart:  Normal rate and regular rhythm. S1 and S2 normal without gallop, murmur, click, rub or other extra sounds. Abdomen:  Bowel sounds positive,abdomen soft and non-tender without masses, organomegaly or hernias noted. Genitalia:  declined Msk:  No deformity or scoliosis noted of thoracic or lumbar spine.   Neurologic:  alert & oriented X3 and strength normal in all extremities.   Skin:  small area of hyperpigmentation on left thigh Cervical Nodes:  No lymphadenopathy noted Psych:  Cognition and judgment appear intact. Alert and cooperative with normal attention span and concentration. No apparent delusions, illusions, hallucinations    Impression & Recommendations:  Problem # 1:  IMPAIRED FASTING GLUCOSE (ICD-790.21) glucose slightly elevated,  declines A1C.  Patient advised to work hard on diet and exercise.    Problem # 2:  HYPERCHOLESTEROLEMIA (ICD-272.0) Assessment: Comment Only  intolerant to statins and fibrates.  Will try Welchol  Her updated medication list for this problem includes:    Welchol 625 Mg Tabs (Colesevelam hcl) .Marland KitchenMarland KitchenMarland KitchenMarland Kitchen 3 tabs by mouth two times a day  Problem # 3:  BARRETTS ESOPHAGUS (ICD-530.85) Assessment: Unchanged Stable continue PPI  Problem # 4:  OSTEOPOROSIS (ICD-733.00) Assessment: Comment Only Plan f/u dexa.  Patient had recent stress fracture in her right foot.   Her updated medication list for this problem includes:    Calcitonin (salmon) 200 Unit/act Soln (Calcitonin (salmon)) .[name]... 1 spray once daily - alternate nostrils  Complete Medication List: 1)  Quinapril-hydrochlorothiazide 20-25 Mg Tabs (Quinapril-hydrochlorothiazide) .... Take 1 tablet by mouth once a day 2)  Potassium Chloride Cr 10 Meq Cpcr (Potassium chloride) .... Take 1 tablet by mouth once a day 3)  Omeprazole 20 Mg Tbec (Omeprazole) .[name].. 1 by mouth two times a day 4)  Bayer Aspirin  325 Mg Tabs (Aspirin) .[name].. 1 by mouth once daily 5)  Ibuprofen 200 Mg Caps (Ibuprofen) .... Take 1 tab as needed for pain 6)  Vitamin E 400 Unit Caps (Vitamin e) .... Take 1 capsule by mouth once a day 7)  Calcium 600 Mg Tabs (Calcium) .... Take 1 tablet by mouth two times a day 8)  Multivitamins Tabs (Multiple vitamin) .... Take 1 tablet by mouth once a day 9)  Ra Beta Carotene 15 Mg Caps (Beta carotene) .... Take 1 capsule by mouth once a day 10)  Vitamin C 1000 Mg Tabs (Ascorbic acid) .... Take 1 tablet by mouth once a day 11)  Co Q-10 200 Mg Caps (Coenzyme q10) .... Take 1 tablet by mouth once a day 12)  Fish Oil Oil (Fish oil) .... Take 1 tablet by mouth once a day 13)  Calcitonin (salmon) 200 Unit/act Soln (Calcitonin (salmon)) .[name].. 1 spray once daily - alternate nostrils 14)  Fluticasone Propionate 50 Mcg/act Susp (Fluticasone propionate) .... Two sprays each nostril once daily 15)  Welchol 625 Mg Tabs (Colesevelam hcl) .... 3 tabs by mouth two times a day  Other Orders: Mammogram (Screening) (Mammo)  Patient Instructions: 1)  Please schedule your bone scan prior to leaving-(ICD-9 733.00) 2)  It is important that you exercise regularly at least 20 minutes 5 times a week. If you develop chest pain, have severe difficulty breathing, or feel very tired , stop exercising immediately and seek medical attention.  3)  You need to lose weight. Consider a lower calorie diet and regular exercise.  4)  Please follow up in 6 months. 5)  Complete your mammogram. Prescriptions: WELCHOL 625 MG TABS (COLESEVELAM HCL) 3 tabs by mouth two times a day  #180 x 2   Entered and Authorized by:   Lemont Fillers FNP   Signed by:   Lemont Fillers FNP on 07/07/2009   Method used:   [facility]        [facility] [phone][facility] (retail)       [address]       [place], [place]  [address]       Ph: [phone]       Fax: [phone]   RxID:    [id number] CALCITONIN (SALMON) 200 UNIT/ACT SOLN (CALCITONIN (SALMON)) 1 spray once daily - alternate nostrils  #1 x 5   Entered and Authorized by:   Lemont Fillers FNP   Signed by:   Lemont Fillers FNP on 07/05/2009   Method used:   [facility]ronically to        [facility] [phone]* (retail)       [address]       [place], [place]  [address]       Ph: [phone]       Fax: [phone]   RxID:   [id number] OMEPRAZOLE 20 MG  TBEC (OMEPRAZOLE) 1 by mouth two times a day  #180 x 1   Entered and Authorized by:   Lemont Fillers FNP   Signed by:   Lemont Fillers FNP on 07/05/2009   Method used:   Faxed to ...       [facility] (mail-order)       [address]       [place], [place]  [id number]       Ph: [phone]       Fax: [phone]   RxID:   [id number] POTASSIUM CHLORIDE CR 10 MEQ  CPCR (POTASSIUM CHLORIDE) Take 1 tablet by mouth once a day  #90 x 1   Entered and Authorized by:   Lemont Fillers FNP   Signed by:   Lemont Fillers FNP on 07/05/2009   Method used:   Faxed to ...       [facility] (mail-order)       [address]       [place], [place]  [id number]       Ph: [phone]       Fax: [phone]   RxID:   [id number] QUINAPRIL-HYDROCHLOROTHIAZIDE 20-25 MG TABS (QUINAPRIL-HYDROCHLOROTHIAZIDE) Take 1 tablet by mouth once a day  #90 x 1   Entered and Authorized by:   Lemont Fillers FNP   Signed by:   Lemont Fillers FNP on 07/05/2009   Method used:   Faxed to ...       [facility] (mail-order)       [address]       [place], [place]  [id number]       Ph: [phone]       Fax: [phone]   RxID:   [id number]   Current Allergies (reviewed today): ! CRESTOR (ROSUVASTATIN CALCIUM) ! LIPITOR (ATORVASTATIN CALCIUM) ! * TB TEST ! ARTHROTEC 50 (DICLOFENAC-MISOPROSTOL) ! SULFA ! * STATINS ! TRICOR (FENOFIBRATE) ! *  PCN AUGMENTIN (  AMOXICILLIN-POT CLAVULANATE) PENICILLIN G POTASSIUM (PENICILLIN G POTASSIUM)

## 2010-06-13 NOTE — Progress Notes (Signed)
Summary: xray results-lmom  Phone Note Outgoing Call   Summary of Call: Could you please have Ms Reder return for a uric acid level.  Also please let her know that I would like to refer her to orthopedics so that they can review her x-ray which shows old fracture and let us know if they feel that this is contributing to her pain in any way.  Thanks Initial call taken by: Lemont Fillers FNP,  June 22, 2009 5:28 PM  Follow-up for Phone Call        will call lab to have test added on.Marland KitchenMarland KitchenMarland KitchenDoristine Devoid  June 23, 2009 1:23 PM   left message on machine ..........Marland KitchenDoristine Devoid  June 23, 2009 1:24 PM

## 2010-06-13 NOTE — Progress Notes (Signed)
Summary: Lab Results  Phone Note Outgoing Call   Summary of Call: Please call patient and let her know that her cholesterol is still very high.  I would like her to start niacin- I have sent this to her pharmacy. She should return in 6-8 weeks for LFT's please diagnosis (hyperlipidemia) Initial call taken by: Lemont Fillers FNP,  October 19, 2009 8:19 AM  Follow-up for Phone Call        patient advised per [facility] instructions. Patient was instructed to return the last weel in July for blood work Follow-up by: Glendell Docker CMA,  October 19, 2009 1:38 PM    New/Updated Medications: NIACIN 500 MG TABS (NIACIN) half tab by mouth at bedtime x 1 week, then1/2 tab twice daily x 1 week, then 1 tab AM 1/2 tab PM x 1week, then 1tab twice daily x 1 week, then 1.5 twice daily Prescriptions: NIACIN 500 MG TABS (NIACIN) half tab by mouth at bedtime x 1 week, then1/2 tab twice daily x 1 week, then 1 tab AM 1/2 tab PM x 1week, then 1tab twice daily x 1 week, then 1.5 twice daily  #90 x 1   Entered and Authorized by:   Lemont Fillers FNP   Signed by:   Lemont Fillers FNP on 10/19/2009   Method used:   Printed then faxed to ...       [facility] [phone]* (retail)       [address]       [place], [place]  [address]       Ph: [phone]       Fax: [phone]   RxID:   [id number]

## 2010-06-13 NOTE — Progress Notes (Signed)
Summary: ABN--LM 11/23,11/28  Phone Note Other Incoming   Caller: Katrina @ [facility] Ext [phone] Summary of Call: Received call from Katrina stating she has an ABN for the lipid panel as test has been determined as too frequent. Notified Sandford Craze, NP and test has been cancelled. Will call pt and let her know that test will be rechecked at her f/u in 3 months (needs to schedule).  Nicki Guadalajara Fergerson CMA Duncan Dull)  April 05, 2010 10:34 AM   Follow-up for Phone Call        Left message on machine to return my call. Nicki Guadalajara Fergerson CMA Duncan Dull)  April 05, 2010 2:24 PM  Spoke to [employer]. She remembers cancelling the test and states to let pt know that when she gets a bill for the lab test to take it to her and they will take care of it. Left messag on machine for pt to return my call. Nicki Guadalajara Fergerson CMA Duncan Dull)  April 10, 2010 12:23 PM    Additional Follow-up for Phone Call Additional follow up Details #1::        Pt notified. Nicki Guadalajara Fergerson CMA Duncan Dull)  April 10, 2010 3:37 PM

## 2010-06-13 NOTE — Assessment & Plan Note (Signed)
Summary: fu meds/dt   Vital Signs:  Patient profile:   70 year old female Height:      62 inches Weight:      171 pounds BMI:     31.39 O2 Sat:      99 % on Room air Temp:     98.1 degrees F oral Pulse rate:   93 / minute Resp:     18 per minute BP sitting:   140 / 80  (right arm) Cuff size:   large  Vitals Entered By: Glendell Docker CMA (March 27, 2010 3:16 PM)  O2 Flow:  Room air CC: follow-up visit Is Patient Diabetic? No Pain Assessment Patient in pain? no      Comments medication follow up for cholesterol, did not have blood work needs [facility]   Primary Care Provider:  Lemont Fillers FNP  CC:  follow-up visit.  History of Present Illness: The patient is a 70 year old female, who presents today for followup.  1) HTN-she reports that she has not been compliant with a low sodium or low-cholesterol diet. Tolerating BP med without  complaints.  Denies edema.  2)Hyperlipidemia- not taking PM dose  of welchol.  Able to tolerate niacin.  Intolerant of statins.  Preventive Screening-Counseling & Management  Alcohol-Tobacco     Smoking Status: quit  Allergies: 1)  ! Crestor (Rosuvastatin Calcium) 2)  ! Lipitor (Atorvastatin Calcium) 3)  ! * Tb Test 4)  ! Arthrotec 50 (Diclofenac-Misoprostol) 5)  ! Sulfa 6)  ! * Statins 7)  ! Tricor (Fenofibrate) 8)  ! * Pcn 9)  Augmentin (Amoxicillin-Pot Clavulanate) 10)  Penicillin G Potassium (Penicillin G Potassium)  Past History:  Past Medical History: Reviewed history from 09/14/2008 and no changes required. Colonic polyps, hx of Nephrolithiasis, hx of Osteoporosis - patient cannot tolerate fosamax Hypertension (03/2003) Hyperlipidemia (03/2003) chicken pox postivie TB skin test   -exposure to 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  Pos PPD only  neg CXR blood transfusion Urinary incontinence Osteoarthritis barret's esophagus with GERD chronic obstructive bronchitis (Dr Delford Field) [facility]  Family  History: Reviewed history from 05/27/2008 and no changes required. Family History of CAD Female 1st degree relative <60 Family History of CAD Female 1st degree relative <50 Family History High cholesterol Family History Hypertension Family History of  arthritis Family History Asthma grandmother and mother Family History Tuberculosis grandmother only   Social History: Reviewed history from 05/06/2008 and no changes required. Retired--Mrs [employer] widow Former Smoker quit in 1969 Alcohol use-no Drug use-no Regular exercise-yes   Review of Systems       see HPI  Physical Exam  General:  Well-developed,well-nourished,in no acute distress; alert,appropriate and cooperative throughout examination Lungs:  Normal respiratory effort, chest expands symmetrically. Lungs are clear to auscultation, no crackles or wheezes. Heart:  Normal rate and regular rhythm. S1 and S2 normal without gallop, murmur, click, rub or other extra sounds. Extremities:  No clubbing, cyanosis, edema, or deformity noted  Psych:  Cognition and judgment appear intact. Alert and cooperative with normal attention span and concentration. No apparent delusions, illusions, hallucinations   Impression & Recommendations:  Problem # 1:  HYPERCHOLESTEROLEMIA (ICD-272.0) Assessment Comment Only patient was instructed to return  for fasting lipid profile Also instructed patient to take the p.m. dose of WelChol, and to work on a low-cholesterol diet,  exercise, and weight loss    Welchol 625 Mg Tabs (Colesevelam hcl) .Marland KitchenMarland KitchenMarland KitchenMarland Kitchen 3 tabs by mouth two times a day  Niacin 500 Mg Tabs (Niacin) ..... One tablet by mouth at bedtime x 1 week, then 2 tabs at bedtime second week, then 3 tabs at bedtime starting on third week.  Future Orders: T-Lipid Profile [id number]) ... 03/29/2010  Complete Medication List: 1)  Quinapril-hydrochlorothiazide 20-25 Mg Tabs (Quinapril-hydrochlorothiazide) .... Take 1 tablet by mouth once a  day 2)  Potassium Chloride Cr 10 Meq Cpcr (Potassium chloride) .... Take 1 tablet by mouth once a day 3)  Omeprazole 20 Mg Tbec (Omeprazole) .[name].. 1 by mouth two times a day 4)  Bayer Aspirin 325 Mg Tabs (Aspirin) .[name].. 1 by mouth once daily 5)  Ibuprofen 200 Mg Caps (Ibuprofen) .... Take 1 tab as needed for pain 6)  Vitamin E 400 Unit Caps (Vitamin e) .... Take 1 capsule by mouth once a day 7)  Calcium 600 Mg Tabs (Calcium) .... Take 1 tablet by mouth two times a day 8)  Multivitamins Tabs (Multiple vitamin) .... Take 1 tablet by mouth once a day 9)  Ra Beta Carotene 15 Mg Caps (Beta carotene) .... Take 1 capsule by mouth once a day 10)  Vitamin C 1000 Mg Tabs (Ascorbic acid) .... Take 1 tablet by mouth once a day 11)  Co Q-10 200 Mg Caps (Coenzyme q10) .... Take 1 tablet by mouth once a day 12)  Fish Oil Oil (Fish oil) .... Take 1 tablet by mouth once a day 13)  Calcitonin (salmon) 200 Unit/act Soln (Calcitonin (salmon)) .[name].. 1 spray once daily - alternate nostrils 14)  Fluticasone Propionate 50 Mcg/act Susp (Fluticasone propionate) .... Two sprays each nostril once daily 15)  Welchol 625 Mg Tabs (Colesevelam hcl) .... 3 tabs by mouth two times a day 16)  Magnesium 500 Mg Tabs (Magnesium) .... One tablet by mouth daily 17)  Align Caps (Probiotic product) .... One cap by mouth daily 18)  Niacin 500 Mg Tabs (Niacin) .... One tablet by mouth at bedtime x 1 week, then 2 tabs at bedtime second week, then 3 tabs at bedtime starting on third week.  Other Orders: TLB-BMP (Basic Metabolic Panel-BMET) (80048-METABOL) T-Hgb A1C ([id number])  Patient Instructions: 1)  Please complete your lab work on the first floor today. 2)  Work hard on diet, exercise, weight loss. 3)  Return fasting for a fasting cholesterol at your earliest convenience.  4)  Follow up in 3 months. Prescriptions: QUINAPRIL-HYDROCHLOROTHIAZIDE 20-25 MG TABS (QUINAPRIL-HYDROCHLOROTHIAZIDE) Take 1 tablet by mouth once a day  #90 x  1   Entered and Authorized by:   Lemont Fillers FNP   Signed by:   Lemont Fillers FNP on 03/27/2010   Method used:   Faxed to ...       [facility] (mail-order)       [address]       [place], [place]  [id number]       Ph: [phone]       Fax: [phone]   RxID:   [id number] POTASSIUM CHLORIDE CR 10 MEQ  CPCR (POTASSIUM CHLORIDE) Take 1 tablet by mouth once a day  #90 x 1   Entered and Authorized by:   Lemont Fillers FNP   Signed by:   Lemont Fillers FNP on 03/27/2010   Method used:   [facility]        [facility] [phone][facility] (retail)       [address]       [place]  [place], [place]  [address]       Ph: [phone]       Fax: [phone]   RxID:   [id number] QUINAPRIL-HYDROCHLOROTHIAZIDE 20-25 MG TABS (QUINAPRIL-HYDROCHLOROTHIAZIDE) Take 1 tablet by mouth once a day  #90 x 1   Entered and Authorized by:   Lemont Fillers FNP   Signed by:   Lemont Fillers FNP on 03/27/2010   Method used:   [facility]ronically to        [facility] [phone]* (retail)       [address]       [place], [place]  [address]       Ph: [phone]       Fax: [phone]   RxID:   [id number]    Orders Added: 1)  TLB-BMP (Basic Metabolic Panel-BMET) [80048-METABOL] 2)  T-Hgb A1C [83036-23375] 3)  T-Lipid Profile [80061-22930] 4)  Est. Patient Level III [[address]01]   Immunization History:  Influenza Immunization History:    Influenza:  historical (02/20/2010)   Immunization History:  Influenza Immunization History:    Influenza:  Historical (02/20/2010)

## 2010-06-13 NOTE — Progress Notes (Signed)
Summary: cholesterol med--appts  Phone Note Outgoing Call   Summary of Call: Please call patient and let her know that I have sent a prescription to her pharmacy for a new cholesterol medicine that won't cause muscle pain.  She should follow up in 3 months and complete a fasting lipid panel 1 week prior to apt.  (ICD-9 272) Initial call taken by: Lemont Fillers FNP,  July 07, 2009 9:46 PM  Follow-up for Phone Call        left message on machine to return call. Follow-up by: Mervin Kung CMA,  July 11, 2009 9:35 AM  Additional Follow-up for Phone Call Additional follow up Details #1::        Notified pt. of med change. Scheduled f/u with Kieran Arreguin for 10/07/09 @ 3:15. Order faxed to [facility] for fasting lipid panel one week prior to f/u appt. Additional Follow-up by: Mervin Kung CMA,  July 11, 2009 4:24 PM

## 2010-06-15 NOTE — Medication Information (Signed)
Summary: Prior Authorization for Zetia/[employer]  Prior Authorization for Zetia[employer]   Imported By: Lanelle Bal 05/30/2010 10:39:03  _____________________________________________________________________  External Attachment:    Type:   Image     Comment:   External Document

## 2010-06-15 NOTE — Progress Notes (Signed)
Summary: PA approval-- zetia  Phone Note Call from Patient Call back at Home Phone [phone]   Caller: Patient Call For: Lemont Fillers FNP Summary of Call: Pt left voice message requesting the status on Zetia Prior Auth. Attempted to reach pt to confirm cholesterol meds she has tried in the past. Will need to add info to the PA form and refax as we have not received a determination from ins. co. yet.  Left message on machine to return my call.  Nicki Guadalajara Fergerson CMA Duncan Dull)  May 31, 2010 8:48 AM   Follow-up for Phone Call        she has tried lipitor made her hurt crestor (it did not work well for her) All the statins make her hurt  Roselle Locus  June 01, 2010 2:18 PM  Additional Follow-up for Phone Call Additional follow up Details #1::        Form completed and faxed to [phone] at 10am today. Awaiting approval/denial. Left msg. for pt to return my call. We have samples if she needs them until approval can be obtained.  Nicki Guadalajara Fergerson CMA Duncan Dull)  June 02, 2010 10:26 AM     Additional Follow-up for Phone Call Additional follow up Details #2::    Pt is currently out of med. Will come by and pick up samples until decision is made by ins. company. Nicki Guadalajara Fergerson CMA Duncan Dull)  June 02, 2010 3:13 PM   Additional Follow-up for Phone Call Additional follow up Details #3:: Details for Additional Follow-up Action Taken: Received approval  from [facility] for Zetia.  Notified Joni Reining at [facility] and she states med shipped out yesterday. Left message for pt to return my call. Nicki Guadalajara Fergerson CMA Duncan Dull)  June 05, 2010 9:23 AM  Pt notified. Nicki Guadalajara Fergerson CMA Duncan Dull)  June 05, 2010 3:58 PM    Prescriptions: ZETIA 10 MG TABS (EZETIMIBE) one tablet by mouth once daily  #21 x 0   Entered by:   Mervin Kung CMA (AAMA)   Authorized by:   Lemont Fillers FNP   Signed by:   Mervin Kung CMA (AAMA) on 06/02/2010   Method used:   Samples Given  RxID:   [id number]  Lot# [id number]    Exp 10/2012.  Nicki Guadalajara Fergerson CMA Duncan Dull)  June 02, 2010 3:15 PM

## 2010-06-15 NOTE — Progress Notes (Signed)
Summary: refill--Zetia  Phone Note Call from Patient Call back at Home Phone [phone]   Caller: Patient Call For: Lemont Fillers FNP Summary of Call: Received message from pt stating she would like Refill for Zetia sent to [facility] mail order. States that she has not had any side effects from the samples that she tried. Requested samples if available.  Attempted to reach pt to let her know that we are currently out of samples; will send Rx. Pt needs to schedule f/u in February. Nicki Guadalajara Fergerson CMA Duncan Dull)  May 16, 2010 2:46 PM   Follow-up for Phone Call        Pt notified rx will be sent to [facility]. F/u scheduled for 07/04/09 @ 3:30pm. Mervin Kung CMA (AAMA)  May 16, 2010 4:57 PM     Prescriptions: ZETIA 10 MG TABS (EZETIMIBE) one tablet by mouth once daily  #90 x 0   Entered by:   Mervin Kung CMA (AAMA)   Authorized by:   Lemont Fillers FNP   Signed by:   Mervin Kung CMA (AAMA) on 05/16/2010   Method used:   Faxed to ...       [facility] (mail-order)       [address]       [place], [place]  [id number]       Ph: [phone]       Fax: [phone]   RxID:   [id number]

## 2010-06-21 NOTE — Medication Information (Signed)
Summary: Refaxed Prior autho for Zetia/[employer]  Refaxed Prior autho for Zetia/[employer]   Imported By: Sherian Rein 06/13/2010 12:45:56  _____________________________________________________________________  External Attachment:    Type:   Image     Comment:   External Document

## 2010-06-29 NOTE — Medication Information (Signed)
Summary: Approval for Zetia/[employer]  Approval for Zetia/[employer]   Imported By: Lanelle Bal 06/20/2010 14:03:55  _____________________________________________________________________  External Attachment:    Type:   Image     Comment:   External Document

## 2010-06-29 NOTE — Medication Information (Signed)
Summary: Revised Prior Authorization for Zetia/[employer]  Revised Prior Authorization for Zetia/[employer]   Imported By: Lanelle Bal 06/20/2010 12:41:10  _____________________________________________________________________  External Attachment:    Type:   Image     Comment:   External Document

## 2010-07-04 ENCOUNTER — Ambulatory Visit (INDEPENDENT_AMBULATORY_CARE_PROVIDER_SITE_OTHER): Payer: Medicare PPO | Admitting: Family

## 2010-07-04 ENCOUNTER — Encounter: Payer: Self-pay | Admitting: Family

## 2010-07-04 DIAGNOSIS — E7800 Pure hypercholesterolemia, unspecified: Secondary | ICD-10-CM

## 2010-07-04 DIAGNOSIS — I10 Essential (primary) hypertension: Secondary | ICD-10-CM

## 2010-07-05 ENCOUNTER — Encounter: Payer: Self-pay | Admitting: Family

## 2010-07-11 NOTE — Miscellaneous (Signed)
Summary: lab order  Clinical Lists Changes  Orders: Added new Test order of T-Basic Metabolic Panel [phone]) - Signed Added new Test order of T-Lipid Profile [phone]) - Signed

## 2010-07-11 NOTE — Assessment & Plan Note (Signed)
Summary: fu of htn, lipid mgmt/tf,cma--rm 4   Vital Signs:  Patient profile:   70 year old female Height:      62 inches Weight:      176.25 pounds BMI:     32.35 Temp:     98.3 degrees F oral Pulse rate:   102 / minute Pulse rhythm:   regular Resp:     18 per minute BP sitting:   140 / 84  (right arm) Cuff size:   large  Vitals Entered By: Mervin Kung CMA Duncan Dull) (July 04, 2010 3:32 PM) CC: Pt here for follow up of hypertension and lipid management Is Patient Diabetic? No Comments Pt needs refills on Quinapril and Potassium sent to [facility]. Nicki Guadalajara Fergerson CMA Duncan Dull)  July 04, 2010 3:39 PM    Primary Care Provider:  Lemont Fillers FNP  CC:  Pt here for follow up of hypertension and lipid management.  History of Present Illness: [name] is a 70 year old female who presents today for follow up.  1. Hyperlipidemia- tolerating Zetia.  Was unable to tolerate statins previously due to muscle pain and weakness.  She reports that she stopped Welchol because she did not realize she was to take together with zetia.  Continues fish oil.  Not being compliant with diet.    2.   Allergies: 1)  ! Crestor (Rosuvastatin Calcium) 2)  ! Lipitor (Atorvastatin Calcium) 3)  ! * Tb Test 4)  ! Arthrotec 50 (Diclofenac-Misoprostol) 5)  ! Sulfa 6)  ! * Statins 7)  ! Tricor (Fenofibrate) 8)  ! * Pcn 9)  Augmentin (Amoxicillin-Pot Clavulanate) 10)  Penicillin G Potassium (Penicillin G Potassium)  Past History:  Past Medical History: Last updated: 09/14/2008 Colonic polyps, hx of Nephrolithiasis, hx of Osteoporosis - patient cannot tolerate fosamax Hypertension (03/2003) Hyperlipidemia (03/2003) chicken pox postivie TB skin test   -exposure to 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  Pos PPD only  neg CXR blood transfusion Urinary incontinence Osteoarthritis barret's esophagus with GERD chronic obstructive bronchitis (Dr Delford Field) IFG  Past Surgical History: Last updated:  05/06/2008 Hysterectomy (1970) Appendectomy Cholecystectomy Total left knee replacement 04/2006 right leg 2008   Review of Systems       see HPI  Physical Exam  General:  Well-developed,well-nourished,in no acute distress; alert,appropriate and cooperative throughout examination Head:  Normocephalic and atraumatic without obvious abnormalities. No apparent alopecia or balding. Lungs:  Normal respiratory effort, chest expands symmetrically. Lungs are clear to auscultation, no crackles or wheezes. Heart:  Normal rate and regular rhythm. S1 and S2 normal without gallop, murmur, click, rub or other extra sounds. Psych:  Cognition and judgment appear intact. Alert and cooperative with normal attention span and concentration. No apparent delusions, illusions, hallucinations   Impression & Recommendations:  Problem # 1:  HYPERCHOLESTEROLEMIA (ICD-272.0) Assessment Comment Only Pt instructed to continue current regimen.  She was only taking 1000mg  of fish oil.  Will increase to 4000mg  daily.  Pt to resume welchol.  Counseled on diet.  Plan to repeat FLP in 3 months. Her updated medication list for this problem includes:    Welchol 625 Mg Tabs (Colesevelam hcl) .Marland KitchenMarland KitchenMarland KitchenMarland Kitchen 3 tabs by mouth two times a day    Niacin 500 Mg Tabs (Niacin) ..... One tablet by mouth at bedtime x 1 week, then 2 tabs at bedtime second week, then 3 tabs at bedtime starting on third week.    Zetia 10 Mg Tabs (Ezetimibe) ..... One tablet by mouth once daily  Problem # 2:  HYPERTENSION (ICD-401.9) Assessment: Unchanged Given her history of borderline DM,  she is not at goal.  Will add norvasc.  F/u in 1 month. Her updated medication list for this problem includes:    Quinapril-hydrochlorothiazide 20-25 Mg Tabs (Quinapril-hydrochlorothiazide) .[name]... Take 1 tablet by mouth once a day    Norvasc 5 Mg Tabs (Amlodipine besylate) ..... One tablet by mouth daily  BP today: 140/84 Prior BP: 140/80 (03/27/2010)  Labs  Reviewed: K+: 4.1 (03/27/2010) Creat: : 0.73 (03/27/2010)   Chol: 331 (04/05/2010)   HDL: 62 (04/05/2010)   LDL: 232 (04/05/2010)   TG: 187 (04/05/2010)  Complete Medication List: 1)  Quinapril-hydrochlorothiazide 20-25 Mg Tabs (Quinapril-hydrochlorothiazide) .... Take 1 tablet by mouth once a day 2)  Potassium Chloride Cr 10 Meq Cpcr (Potassium chloride) .... Take 1 tablet by mouth once a day 3)  Omeprazole 20 Mg Tbec (Omeprazole) .[name].. 1 by mouth two times a day 4)  Bayer Aspirin 325 Mg Tabs (Aspirin) .[name].. 1 by mouth once daily 5)  Ibuprofen 200 Mg Caps (Ibuprofen) .... Take 1 tab as needed for pain 6)  Vitamin E 400 Unit Caps (Vitamin e) .... Take 1 capsule by mouth once a day 7)  Calcium 600 Mg Tabs (Calcium) .... Take 1 tablet by mouth two times a day 8)  Multivitamins Tabs (Multiple vitamin) .... Take 1 tablet by mouth once a day 9)  Ra Beta Carotene 15 Mg Caps (Beta carotene) .... Take 1 capsule by mouth once a day 10)  Vitamin C 1000 Mg Tabs (Ascorbic acid) .... Take 1 tablet by mouth once a day 11)  Co Q-10 200 Mg Caps (Coenzyme q10) .... Take 1 tablet by mouth once a day 12)  Fish Oil 1000 Mg Caps (Omega-3 fatty acids) .... 2 caps by mouth two times a day 13)  Calcitonin (salmon) 200 Unit/act Soln (Calcitonin (salmon)) .[name].. 1 spray once daily - alternate nostrils 14)  Fluticasone Propionate 50 Mcg/act Susp (Fluticasone propionate) .... Two sprays each nostril once daily 15)  Welchol 625 Mg Tabs (Colesevelam hcl) .... 3 tabs by mouth two times a day 16)  Magnesium 500 Mg Tabs (Magnesium) .... One tablet by mouth daily 17)  Align Caps (Probiotic product) .... One cap by mouth daily 18)  Niacin 500 Mg Tabs (Niacin) .... One tablet by mouth at bedtime x 1 week, then 2 tabs at bedtime second week, then 3 tabs at bedtime starting on third week. 19)  Zetia 10 Mg Tabs (Ezetimibe) .... One tablet by mouth once daily 20)  Norvasc 5 Mg Tabs (Amlodipine besylate) .... One tablet by mouth  daily  Patient Instructions: 1)  Please work hard on diet, exercise and weight loss. 2)  Follow up in 1 month for a BP check. Prescriptions: POTASSIUM CHLORIDE CR 10 MEQ  CPCR (POTASSIUM CHLORIDE) Take 1 tablet by mouth once a day  #90 x 1   Entered and Authorized by:   Lemont Fillers FNP   Signed by:   Lemont Fillers FNP on 07/04/2010   Method used:   Print then Give to Patient   RxID:   [id number] QUINAPRIL-HYDROCHLOROTHIAZIDE 20-25 MG TABS (QUINAPRIL-HYDROCHLOROTHIAZIDE) Take 1 tablet by mouth once a day  #90 x 1   Entered and Authorized by:   Lemont Fillers FNP   Signed by:   Lemont Fillers FNP on 07/04/2010   Method used:   Print then Give to Patient   RxID:   [id number] NORVASC 5  MG TABS (AMLODIPINE BESYLATE) one tablet by mouth daily  #30 x 0   Entered and Authorized by:   Lemont Fillers FNP   Signed by:   Lemont Fillers FNP on 07/04/2010   Method used:   Electronically to        [facility] [phone]* (retail)       [address]       [place], [place]  [address]       Ph: [phone]       Fax: [phone]   RxID:   [id number]    Orders Added: 1)  Est. Patient Level III [[address]]     Current Allergies (reviewed today): ! CRESTOR (ROSUVASTATIN CALCIUM) ! LIPITOR (ATORVASTATIN CALCIUM) ! * TB TEST ! ARTHROTEC 50 (DICLOFENAC-MISOPROSTOL) ! SULFA ! * STATINS ! TRICOR (FENOFIBRATE) ! * PCN AUGMENTIN (AMOXICILLIN-POT CLAVULANATE) PENICILLIN G POTASSIUM (PENICILLIN G POTASSIUM)

## 2010-07-14 ENCOUNTER — Telehealth: Payer: Self-pay | Admitting: Family

## 2010-07-17 ENCOUNTER — Encounter: Payer: Self-pay | Admitting: Family

## 2010-07-25 NOTE — Progress Notes (Signed)
Summary: quinapril, potassium refill status  Phone Note Call from Patient Call back at Home Phone [phone]   Caller: Patient Call For: Lemont Fillers FNP Summary of Call: Received message from pt stating that she needed refills on Quinapril and Potassium to go to [facility]. Called [facility] and Kathlene November stated that pt still had active refills remaining on these prescriptions. Pt will need to contact them re: payment method and he can ship them out. Attempted to reach pt. Left message on voicemail re: active refills and to call me back with status of written rxs given to her at last office visit. Nicki Guadalajara Fergerson CMA Duncan Dull)  July 14, 2010 4:36 PM   Follow-up for Phone Call        Pt called and left voicemail stating the name of the meds that she needs filled which is quinapril and potassium. Called pt back and got voicemail. Left detailed message that she needs to call [facility] for refills before they can ship meds to her. Also requested pt call me back re: status of printed RXs from 07/04/10. Nicki Guadalajara Fergerson CMA Duncan Dull)  July 17, 2010 2:17 PM   Additional Follow-up for Phone Call Additional follow up Details #1::        Pt returned call and left voice message that she did not get written rxs the day she was in the office. Nicki Guadalajara Fergerson CMA (AAMA)  July 19, 2010 10:08 AM

## 2010-08-01 ENCOUNTER — Encounter: Payer: Self-pay | Admitting: Family

## 2010-08-01 ENCOUNTER — Ambulatory Visit (INDEPENDENT_AMBULATORY_CARE_PROVIDER_SITE_OTHER): Payer: Medicare PPO | Admitting: Family

## 2010-08-01 DIAGNOSIS — I10 Essential (primary) hypertension: Secondary | ICD-10-CM

## 2010-08-01 DIAGNOSIS — R7309 Other abnormal glucose: Secondary | ICD-10-CM

## 2010-08-01 DIAGNOSIS — E785 Hyperlipidemia, unspecified: Secondary | ICD-10-CM

## 2010-08-01 DIAGNOSIS — R7303 Prediabetes: Secondary | ICD-10-CM

## 2010-08-01 MED ORDER — AMLODIPINE BESYLATE 5 MG PO TABS: 5.0000 mg | ORAL_TABLET | Freq: Every day | ORAL | Status: DC

## 2010-08-01 NOTE — Assessment & Plan Note (Signed)
>>  ASSESSMENT AND PLAN FOR ESSENTIAL HYPERTENSION WRITTEN ON 08/01/2010  4:53 PM BY O'SULLIVAN, Katerin Negrete, NP  BP is improved today. Pt will check her BP as listed in sign out sheet and will plan to resume her amlodipine if her BP rises above 140/90.

## 2010-08-01 NOTE — Assessment & Plan Note (Signed)
>>  ASSESSMENT AND PLAN FOR HYPERCHOLESTEROLEMIA WRITTEN ON 08/01/2010  4:47 PM BY O'SULLIVAN, Remee Charley, NP  Intolerant of statins and welchol.  She is taking Zetia  and has lost 10 pounds. She plans to continue her weight loss.  She will follow up in May for a fasting lipid panel.

## 2010-08-01 NOTE — Assessment & Plan Note (Signed)
BP is improved today. Pt will check her BP as listed in sign out sheet and will plan to resume her amlodipine if her BP rises above 140/90.

## 2010-08-01 NOTE — Patient Instructions (Signed)
Please check blood pressure once daily for the next 1 week.  Resume amlodipine (norvasc) if blood pressure goes above 140/90. Keep up the good work with the diet and exercise. Follow up after May 23rd. Complete your fasting blood work 1 week prior.

## 2010-08-01 NOTE — Progress Notes (Signed)
  Subjective:    Patient ID: [name], female    DOB: [birth year], 70 y.o.   MRN: [id number]  HPI  Ms.  [name] presents today for routine follow up.  HTN- ran out of norvasc 2 days ago - tolerated without difficulty.    Hyperlipidemia- stopped welchol due to nausea.  She continues niacin and fish oil.  Overweight- Pt reports that she working hard on her diet and exercise.  She lost 10 pounds since her last visit.   Review of Systems Denies LE edma, MS- Denies muscle pain    Objective:   Physical Exam  Gen: awake, alert, NAD Neck: supple, no JVD, no thyroid enlargement. CV: s1/s2, RRR, no LE Edema Resp: BS CTA bilaterally, no wheezes, rales or rhonchi.  Psych: A and O x 3 pleasant affect.    Assessment & Plan:

## 2010-08-01 NOTE — Assessment & Plan Note (Signed)
Intolerant of statins and welchol.  She is taking Zetia and has lost 10 pounds. She plans to continue her weight loss.  She will follow up in May for a fasting lipid panel.

## 2010-09-26 NOTE — Discharge Summary (Signed)
NAMEJACQUEL, REDDITT              ACCOUNT NO.:  [id number]   MEDICAL RECORD NO.:  [id number]          PATIENT TYPE:  INP   LOCATION:  1528                         [facility]ACILITY[facility]  [facility]   PHYSICIAN:  Georges Lynch. Gioffre, M.D.DATE OF BIRTH:  March 24, 1941   DATE OF ADMISSION:  12/10/2006  DATE OF DISCHARGE:  12/13/2006                               DISCHARGE SUMMARY   ADMISSION DIAGNOSES:  1. 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  2. Status post intramedullary nailing of subtrochanteric,      intertrochanteric fracture, with grafting and Dall-Miles cables.  3. Hypertension.  4. History of hiatal hernia.  5. History of kidney stones.  6. History of hypercholesterolemia.  7. History of osteoporosis.  8. Positive tuberculosis test but negative disease.   DISCHARGE DIAGNOSIS:  1. Revision of femoral neck screw for intertrochanteric,      subtrochanteric fracture with intramedullary nailing with repeat      bone-grafting of delayed union.  2. Postoperative blood loss anemia, allowed to self-correct with p.o.      supplements, no transfusions.  3. Hypertension.  4. History of hiatal hernia.  5. History of kidney stones.  6. History of cholesterolemia.  7. History of osteoporosis.  8. History of positive for tuberculosis test with negative disease.   HISTORY OF PRESENT ILLNESS:  Ms. Poynor is a 70 year old female well-  known to Dr. Darrelyn Hillock, who had a subtrochanteric, intertrochanteric  fracture earlier this year.  It was repaired with an IM nail, Dall-Miles  cables and grafting.  The patient has noted over her postoperative  course that the screw has been backing out and finally significantly  progressed where it is completely loose and migrated out of the femoral  neck and causing increased pain.  There were also significant signs of  delayed union.  Dr. Darrelyn Hillock and the patient discussed revising the screw  and evaluating nonunion with possible grafting.  The patient  was  admitted to the hospital for this procedure.   MEDICATIONS ON ADMISSION:  1. Potassium 10 mEq a day.  2. Lisinopril 25 mg a day.  3. Hydrochlorothiazide 25 mg a day.  4. Percocet one to two every 4-6 hours.  5. Prilosec 20 mg a day.   SURGICAL PROCEDURES:  On December 10, 2006, the patient was taken the OR by  Dr. Worthy Rancher, assisted by Oneida Alar, PA-C.  Under general anesthesia,  underwent a revision of the  migrating femoral neck screw from the  previous intertrochanteric IM nailing system.  This screw was removed.  It was re-drilled.  The screw was replaced with excellent fixation and  positioning.  The fracture site was then reevaluated and found to be a  probable nonunion.  It was debrided back to bleeding cortical bone and  then repacked with bone graft at the fracture site.  The patient  tolerated the procedure well.  There were no complications.  Estimated  blood loss was 300 mL.  The patient was transferred to the recovery room  then to the orthopedic floor in good condition.   CONSULTS:  The following routine  consults were requested:  Physical  therapy, case management.   HOSPITAL COURSE:  On December 10, 2006, the patient was admitted to [facility] under the care of Dr. Worthy Rancher.  The patient was taken  to the OR, where the revision of her femoral neck screw and her right  hip was performed with bone-grafting of the nonunion fracture site.  The  patient tolerated this procedure well.  There were no complications.  The patient was transferred to the recovery room and then to the  orthopedic floor in good condition, where the patient then spent 3 days'  postoperative course without any significant untoward events.  The  patient's wound remained benign for any evidence of infection.  The leg  remained neuromotor and vascularly intact.  Her pain was well-controlled  with initially IV pain medicines.  She was able to transition to p.o.  medications well without  any issues.  The patient did develop some  postoperative blood loss anemia.  At admission her hemoglobin was 12.5.  It dropped to 9.0 but she tolerated it well, did not require  transfusion, and was allowed to self-correct with just p.o. supplements.  The patient was also placed on Coumadin for DVT prophylaxis.  The  patient was discharged to home in good condition with routine follow-up.   LABS:  CBC on admission found WBC at 9.7, hemoglobin 12.5, hematocrit  36.1, platelets 458.  Date of discharge, her hemoglobin was 9.0 with a  hematocrit of 25.7.  INR was at 1.6.  date of discharge, routine  chemistries found sodium of 141, potassium of 4.1, glucose 118, BUN 2,  creatinine 0.51, with an estimated GFR of greater than 60.   DISCHARGE INSTRUCTIONS:  1. Activity:  The patient is to be partial weightbearing on her right      lower extremity with use of a walker.  2. The patient may resume a regular diet.  3. Wound care:  The patient should change her dressing on her right      hip daily.  4. Home health physical therapy through [facility] with Coumadin      monitoring.   MEDICATIONS:  1. Coumadin 5 mg a day as directed by Genevieve Norlander pharmacist.  2. Percocet 10/650 mg one tablet every 4-6 hours for pain if needed.  3. Potassium 10 mEq a day.  4. Lisinopril 25 mg a day.  5. Hydrochlorothiazide 25 mg a day.  6. Prilosec 20 mg a day.   The patient's condition upon discharge to home is listed improved and  good.      Jamelle Rushing, P.A.    ______________________________  Georges Lynch Darrelyn Hillock, M.D.    RWK/MEDQ  D:  12/25/2006  T:  12/26/2006  Job:  [id number]

## 2010-09-26 NOTE — Op Note (Signed)
[name], Miranda              ACCOUNT NO.:  [id number]   MEDICAL RECORD NO.:  [id number]          PATIENT TYPE:  INP   LOCATION:  1528                         FACILITY:  [facility]   PHYSICIAN:  Georges Lynch. Gioffre, M.D.DATE OF BIRTH:  02/20/1941   DATE OF PROCEDURE:  12/10/2006  DATE OF DISCHARGE:                               OPERATIVE REPORT   SURGEON:  Georges Lynch. Darrelyn Hillock, M.D.   ASSISTANT:  Arlyn Leak, PA   PREOPERATIVE DIAGNOSES:  1. 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  2. Migrating compression screw right hip.   POSTOPERATIVE DIAGNOSES:  1. 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  2. Migrating compression screw right hip.   OPERATION:  1. Removal of a migrating compression screw right hip and replacement      with another compression hip screw.  2. Curetting and bone grafting of the delayed union site of the      intertrochanteric fracture, subtrochanteric fracture combined.   PROCEDURE:  Under general anesthesia the patient on the fracture table.  A sterile prep and draping carried out.  The C-arm was brought in to  locate the migrating hip nail.  An incision was made over the nail.  A  guide pin was inserted up through the nail into the femoral head up into  the acetabulum to lock the femoral head in place.  Following that I then  removed the migrating nail, drilled the femoral neck up into the  subchondral bone, tapped the area and then inserted the compression  screw 100 mm the length across the fracture site up into the femoral  head.  We had a solid purchase.  Following that we brought the C-arm in  again to inspect the fracture site.  We curetted out a large amount of  fibrous tissue.  There was a partial union.  We curetted out the scar  tissue, then replaced that with bone graft material utilizing  croutons  from the bone bank.  We then closed the wound layers usual fashion.  Sterile dressings were applied.  She had clindamycin preop.  Postop she  will be maintained on Coumadin and clindamycin.           ______________________________  Georges Lynch. Darrelyn Hillock, M.D.     RAG/MEDQ  D:  12/10/2006  T:  12/11/2006  Job:  [id number]

## 2010-09-26 NOTE — H&P (Signed)
NAMEITZIA, CUNLIFFE              ACCOUNT NO.:  [id number]   MEDICAL RECORD NO.:  [id number]          PATIENT TYPE:  INP   LOCATION:  NA                           FACILITY:  [facility]   PHYSICIAN:  Georges Lynch. Gioffre, M.D.DATE OF BIRTH:  10-04-1940   DATE OF ADMISSION:  DATE OF DISCHARGE:                              HISTORY & PHYSICAL   CHIEF COMPLAINT:  Painful right hip.   HISTORY OF PRESENT ILLNESS:  Ms. Lai is a 70 year old female, well  known to Dr. Darrelyn Hillock and myself with increased right hip pain.  She had  a previous subtrochanteric-intertrochanteric fracture earlier this year,  was repaired with an antegrade trochanteric nail with femoral neck screw  with Dall-Miles wires and grafting.  She did very well up until recently  when she noticed significant increased discomfort and prominence over  the lateral aspect of her right hip.  X-rays revealed that the femoral  neck screw has backed out completely and is prominent laterally and  there is questionable healing around the fracture site.  Dr. Darrelyn Hillock  would like to readmit her, take her back to the OR to remove the  previous screw, redrill, and refixate with a new screw of possible  methylmethacrylate and possible grafting around the fracture site.   CURRENT MEDICATIONS:  1. Potassium 10 mEq a day.  2. Lisinopril 25 mg a day.  3. Hydrochlorothiazide 25 mg a day.  4. Percocet one or two tablets every 4-6 hours for pain.  5. Prilosec 20 mg once a day.   PAST MEDICAL HISTORY:  1. Well-controlled hypertension.  2. History of hiatal hernia.  3. History of kidney stones.  4. History of positive testing for tuberculosis but negative sun      films.  5. Osteoporosis.  6. Hypercholesterolemia.   PAST SURGICAL HISTORY:  1. Left total knee arthroplasty.  2. Hysterectomy.  3. Cholecystectomy.  4. Right hip intertrochanteric-subtrochanteric fracture repair with IM      nail.   REVIEW OF SYSTEMS:  Negative at this time for  any cardiac, respiratory,  GI, GU, hematologic symptoms not related to her right hip pain.   FAMILY HISTORY:  Mother is deceased from heart disease.  Father has  cardiac disease but no other issues.   SOCIAL HISTORY:  The patient is a widow, lives alone.  She previously  worked as a Naval architect.  She is totally disabled at this point.  She does not smoke or drink.  She has got two grown children.  She is  assisted frequently by her daughter.   PHYSICAL EXAMINATION:  VITAL SIGNS:  Height 5 feet 2 inches, weight is  170 pounds, blood pressure is 132/60, pulse is 72 and regular,  respirations 12.  The patient is afebrile.  GENERAL:  This is a healthy-appearing female, conscious, alert,  appropriate, appears to be in no extreme distress.  She is currently  following our recommendations, nonweight-bearing, and she is in a  wheelchair today.  HEENT:  Head is normocephalic.  Pupils equal, round, and reactive.  Gross hearing is intact.  NECK:  Good range of motion, no  pain.  CHEST:  Lung sounds clear and equal.  HEART:  Regular rate and rhythm.  ABDOMEN:  Soft, nontender.  EXTREMITIES:  Upper extremities with good range of motion without any  deformities.  Right hip:  Surgical incision is healed nicely.  She does have a  moderate amount of swelling in that area with ecchymosis just inferior  to the incision.  She has got good motion of the hip.  She has got good  range of motion of her right knee.  Her right calf is soft and  nontender.  Left leg has good motion of the hip.  Her left total knee  arthroplasty incision is healed nicely.  No limitations.  Ankles are  symmetrical with good dorsiplantar flexion.   Peripheral vascular:  She has got no peripheral vascular compromise at  this time.  No lower extremity lesions.  NEUROLOGIC:  The patient was conscious, alert, and appropriate.  She  appears to be a good historian.  No neurologic defects.   IMPRESSION:  1. Failed right hip  subtrochanteric-intertrochanteric fracture repair      with backing out of femoral neck screw, possible delayed union.  2. Well-controlled hypertension.  3. History of hiatal hernia.  4. History of kidney stones.  5. History of hypercholesterolemia.  6. History of osteoporosis.  7. Positive tuberculosis with negative disease.   PLAN:  At this time Dr. Darrelyn Hillock would like to admit Ms. Landfair on the  date of surgical procedure for a removal of previous femoral neck screw  with possible replacement of the screw with either redrilling or  methylmethacrylate with possible grafting around the previous fracture  site.  The patient will undergo all routine labs and tests prior to the  surgical procedure.      Jamelle Rushing, P.A.    ______________________________  Georges Lynch Darrelyn Hillock, M.D.    RWK/MEDQ  D:  12/09/2006  T:  12/10/2006  Job:  [id number]   cc:   Windy Fast A. Darrelyn Hillock, M.D.  Fax: [phone]

## 2010-09-29 NOTE — Discharge Summary (Signed)
[name], [name]              ACCOUNT NO.:  [id number]   MEDICAL RECORD NO.:  [id number]          PATIENT TYPE:  INP   LOCATION:  1620                         FACILITY:  [facility]   PHYSICIAN:  [name]. [name], M.D.DATE OF BIRTH:  11-02-1940   DATE OF ADMISSION:  07/25/2006  DATE OF DISCHARGE:  07/29/2006                               DISCHARGE SUMMARY   ADMISSION DIAGNOSES:  1. Recent right hip subtrochaneteric fracture repaired with      intramedullary nailing cabling.  2. Poor pain control.  3. Hypertension.  4. Coumadin for DVT prophylaxis.  5. Gastroesophageal reflux disease.   DISCHARGE DIAGNOSES:  1. Right hip ORIF with intramedullary nailing cabling  2. Improved pain control.  3. Hypertension.  4. Coumadin dosing for DVT prophylaxis.  5. Gastroesophageal reflux disease.[name]   HISTORY OF PRESENT ILLNESS:  The patient is a 70 year old female here  today, was readmitted back to the hospital after 6 days previously  undergoing a major hip reconstruction with intramedullary nailing cables  due to a subtroch intertrochanteric fracture.  The patient was  discharged to [facility].  She had poor pain control.  She was not  comfortable with the skilled nursing facilities.  The patient signed  herself out and took herself to the emergency room and we are admitting  her today for better pain control and reevaluation and consideration of  other skilled nursing versus rehab with other facilities.[name]   MEDICATIONS ON THIS ADMISSION:  1. Benadryl  2. Potassium chloride  3. Lisinopril  4. Vitamin C  5. Multivitamins.  6. Robaxin  7. Hydrochlorothiazide.  8. Colace.  9. Coumadin.  10.Duragesic patch  11.OxyContin.  12.Prilosec  13.Reglan.   SURGICAL PROCEDURES:  None. .   CONSULTS:  Physical therapy, case management.   HOSPITAL COURSE:  On July 25, 2006 the patient was readmitted back into  [facility] for pain control issues.  The patient was placed  back on  IV pain medicines with PCA,  continued with p.o. supplements.  The patient's hip was reevaluated with x-rays.  No changes were noted in  the fracture alignment with the repair.  The patient's vital signs  remained stable.  Her INR was  2.8 on Coumadin.  The patient spent  several days on the orthopedic floor.  She was gradually able to  transition from the IV pain medicines onto p.o. medicines for better  pain control.  She continued to work with therapy for transitioning from  bed to chair.  She was maintained on nonweightbearing status on the  right lower extremity.  Her wound remained for any signs of infection.  On 07/29/2006 it was felt that the patient was stable.  Her pain was  well-controlled on p.o. pain meds.  Her leg was neuromotor and  vascularly intact.  Her wound was benign for any signs of infection.  The patient was accepted on rehab services.  The patient was transferred  to rehab services in good condition.   LABORATORY DATA:  The patient's INR on date of discharge to rehab  services was 1.2.  No other labs were done.  DISCHARGE INSTRUCTIONS:  The patient is to continue meds as dispensed on  orthopedic floor and will be dictated below.  The patient is to maintain nonweightbearing status of her right lower  extremity.  The patient is to have this staples removed postop day #14.  Today is day #10.  Steri-Strips will be applied, otherwise the wound is  to be checked on a daily basis for any signs of infection.   The patient is to continue with a routine normal no restriction diet.   FOLLOW UP:  The patient needs a follow-up appointment with Dr. Darrelyn [name] 1  week after discharge from skilled or rehab services.   MEDICATIONS:  1. Benadryl 25 mg a day p.r.n.  2. Potassium chloride 10 mEq once a day.  3. Lisinopril 25 mg once a day.  4. Vitamin C 500 units once a day.  5. Multivitamins 1 tablet once a day.  6. Robaxin 500 mg q.6 h p.r.n.  7. Hydrochlorothiazide 25 mg  once a day.  8. Colace 100 mg twice a day.  9. Coumadin 5 mg once a day.  10.Duragesic 25 mcg patch once every 3 days.  11.OxyContin 10 mg tablet every 12 hours.  12.Percocet 5 mg one or 2 tablets every 4-6 hours p.r.n. pain.  13.Prilosec 20 mg 1 tablet a day.  14.Reglan 10 mg every 8 hours p.r.n.   The patient's condition upon discharge to rehab services is listed  improved.      [name], P.A.    ______________________________  [name] [name], M.D.    RWK/MEDQ  D:  08/20/2006  T:  08/20/2006  Job:  [id number]

## 2010-09-29 NOTE — H&P (Signed)
[name], [name]              ACCOUNT NO.:  [id number]   MEDICAL RECORD NO.:  [id number]          PATIENT TYPE:  INP   LOCATION:  1620                         FACILITY:  [facility]   PHYSICIAN:  Georges Lynch. Gioffre, M.D.DATE OF BIRTH:  1940-11-21   DATE OF ADMISSION:  07/25/2006  DATE OF DISCHARGE:                              HISTORY & PHYSICAL   [name] underwent major hip reconstruction by me approximately 6  days ago.  She had a complex intertrochanteric subtrochanteric fracture  that required an intramedullary rod and wire fixation and bone grafting.  Because of the nature of her fracture, she was kept at bedrest.  She was  finally transferred to [facility] rehab section on July 24, 2006.  I  received a call from her son this morning and he was quite upset because  he did not feel that his mother was getting the proper care, and also  they did not have the appropriate automatic bed for her to elevate and  let her head of her bed down.  I spoke to the hospital administrator  there and also spoke to Dr. Jacky Kindle, who is in charge.  Following that I  was in the operating room today and I received a call from the emergency  room that the patient was in the emergency room because the family  really did not want her in the facility.  I went down to the emergency  room after surgery and discussed the case with the son and his mother.  Basically her problem is this:  She had a significant fracture of her  hip.  She was in significant amount of pain and the family felt that  they could not get the proper care there that she received in the  hospital.  The patient had no new injuries.   PAST HISTORY:  Basically is the same as it was here at the time of her  discharge.  She had, under surgery, a previous total knee arthroplasty  by me on the left side in the past.  The allergies to medications were  PENICILLIN, SULFA and MORPHINE.  She had a history of  hypercholesterolemia,  osteoporosis, hiatal hernia, kidney stone, and  hypertension.   PHYSICAL EXAMINATION:  VITAL SIGNS:  Her blood pressure was 138/77,  pulse 114.  Respirations were 20.  HEAD AND NECK:  Negative.  LUNGS:  Clear.  HEART:  Normal sinus rhythm with no murmur.  ABDOMEN:  Negative.  EXTREMITIES:  Left lower extremity:  She had a healed incision over the  anterior aspect of the left knee with good knee motion.  Right hip:  Her  right thigh was quite swollen and that was because of the blood loss she  had prior to and during surgery.  Wound site was fine.  There were no  signs of any infection.  Pain:  She had painful motion of her right hip  which was really limited.   X-ray of her hip was taken by me in the emergency room, showed the rod  in good position.  The Dall-Miles wires were in good position.  But I  think what happened is she may have slightly collapsed the fracture site  around the compression screw and I think that is probably why she had  the pain, but she certainly does not need any further surgery at this  point.   So she is going to be admitted for pain control and also observation of  that fracture, and we will see if we cannot find another facility for  her.           ______________________________  Georges Lynch. Darrelyn Hillock, M.D.     RAG/MEDQ  D:  07/25/2006  T:  07/27/2006  Job:  [id number]

## 2010-09-29 NOTE — H&P (Signed)
NAMEHOLLYANN, PABLO              ACCOUNT NO.:  [id number]   MEDICAL RECORD NO.:  [id number]          PATIENT TYPE:  IPS   LOCATION:  4033                         FACILITY:  [facility]   PHYSICIAN:  Ranelle Oyster, M.D.DATE OF BIRTH:  20-Nov-1940   DATE OF ADMISSION:  07/29/2006  DATE OF DISCHARGE:                              HISTORY & PHYSICAL   CHIEF COMPLAINT:  Right hip pain.   HISTORY OF PRESENT ILLNESS:  This is a pleasant 70 year old white female  with a history of left total knee replacement in December 2007, with a  recent intertrochanteric/subtrochanteric hip fracture on the right, who  underwent an ORIF with IM nailing on July 20, 2006.  The patient was  placed on Coumadin for DVT prophylaxis and was made nonweightbearing.  She went to [facility] on July 24, 2006.  She was  readmitted on July 25, 2006 with increased hip pain.  Workup was  negative for new fracture or malalignment of hardware.  No further  intervention was recommended.  She remained nonweightbearing right lower  extremity.  Duragesic patch was placed for pain control and increased  ultimately to 50 mcg daily.  The patient continues to struggle with  mobility and self care and thus was brought to the rehab unit today.   REVIEW OF SYSTEMS:  Positive for reflux, joint ,swelling and hip pain.  She has had improving sleep overall.  Denies any shortness of breath.  She has had some gas and abdominal distention, but has been moving her  bowels.  Foley catheter is in place.   PAST MEDICAL HISTORY:  1. Hypertension.  2. Reflux disease.  3. Kidney stones.  4. Hyperlipidemia.  5. Left total knee replacement in December 2007.  6. Right intertrochanteric/subtrochanteric hip fracture on July 20, 2006.  7. Hysterectomy.  8. Cholecystectomy.  9. Appendectomy.  10.Remote tobacco history.  11.Negative alcohol history.   FAMILY HISTORY:  Positive for coronary artery disease.   SOCIAL  HISTORY:  The patient lives in [place] alone.  She has a  daughter in the area who can assist her.  She has a one-level house with  one step to enter.   FUNCTIONAL HISTORY:  Notable for the patient being independent prior to  July 20, 2006.  Currently, she needs moderate assist for basic mobility  and self care.   ALLERGIES:  1. PENICILLIN.  2. SULFA.  3. MORPHINE.  4. LIPITOR.   MEDICATIONS AT HOME:  1. Coumadin 5 mg a day.  2. Robaxin 500 mg q.6 h.  3. Benadryl p.r.n.  4. Vitamin C 500 mg daily.  5. Roxicet 5/325 p.r.n.  6. Hydrochlorothiazide 25 mg daily.  7. KCl 20 mEq daily.  8. Prilosec 20 mg daily.  9. Multivitamin daily.  10.Duragesic patch 25 mcg q.72 h.  11.Oxycodone IR p.r.n.  12.Reglan p.r.n.  13.Colace b.i.d.  14.Lisinopril 20 mg daily.   LABORATORIES:  Hemoglobin 10.7, hematocrit 31.6.  Sodium 136, potassium  3.5, BUN and creatinine 4 and 0.5.   PHYSICAL EXAMINATION:  VITAL SIGNS:  Blood pressure 164/68, pulse 102,  respiratory rate 18, temperature 97.3.  GENERAL:  Pleasant, in no acute stress.  She is alert and oriented x3.  HEENT:  Her nose and throat exam is unremarkable, with good dentition.  She did have some sores which were not visible behind her dentures.  NECK:  Supple, without JVD or lymphadenopathy.  CHEST: Clear to auscultation bilaterally, without wheezes, rales, or  rhonchi.  HEART:  Regular rate and rhythm, without murmurs, rubs, gallops.  EXTREMITIES:  Showed no clubbing, cyanosis, or edema.  ABDOMEN: Soft, nontender.  Bowel sounds are positive.  MUSCULOSKELETAL: Notable left knee replacement wound.  The patient has  some limitations in left knee flexion.  Right hip wound is clean and  intact with staples.  Wound was well proximal well-approximated.  No  drainage was seen.  She did have some areas associated with tape burns  and one denuded area posteriorly.  NEUROLOGIC:  Cranial nerves II-XII  are intact.  Reflexes are 2+.  Sensation  is normal.  Judgment,  orientation, memory, nd mood were all within normal limits.   ASSESSMENT AND PLAN:  1. Functional deficit secondary to right      intertrochanteric/subtrochanteric hip fracture, status post open      reduction and internal fixation and intramedullary nail on July 20, 2006.  Begin comprehensive inpatient rehab with PT to assess for      range of motion both at the left knee and right hip as well as      strengthening, pre-gait training, and short distance gait training.      OT will assess for range of motion, upper extremity use, and ADLs.      Rehab nurse will follow on a 24-hour basis for skin care, pain, and      bowel and bladder issues.  Discontinued Foley catheter in the      morning.  Case management/social work will assess for psychosocial      needs and discharge planning.  Estimated length of stay is 7-10      days.  Prognosis is good.  Goals:  Modified independent      supervision.  2. Postoperative anemia.  Follow up CBC.  Add iron supplement.  3. Left total knee replacement, with tightness.  Add CPM.  4. Hypertension.  Continue hydrochlorothiazide and lisinopril.  5. Pain management.  Continue Duragesic patch at 50 mcg q.72 h.      Oxycodone will be used for breakthrough pain.  Also utilize ice and      heat.  6. Reflux disease.  Protonix.  7. Deep venous thrombosis prophylaxis.  Continue subcutaneous Lovenox      until INR is greater than 2, at which time she will be on Coumadin      alone.      Ranelle Oyster, M.D.  Electronically Signed     ZTS/MEDQ  D:  07/29/2006  T:  07/30/2006  Job:  [id number]

## 2010-09-29 NOTE — Op Note (Signed)
NAMESYBOL, MORRE              ACCOUNT NO.:  [id number]   MEDICAL RECORD NO.:  [id number]          PATIENT TYPE:  INP   LOCATION:  1621                         FACILITY:  [facility]   PHYSICIAN:  Georges Lynch. Gioffre, M.D.DATE OF BIRTH:  Jan 11, 1941   DATE OF PROCEDURE:  07/20/2006  DATE OF DISCHARGE:                               OPERATIVE REPORT   SURGEON:  Georges Lynch. Darrelyn Hillock, M.D.   ASSISTANT:  Jamelle Rushing, P.A.   PREOP DIAGNOSIS:  1. A complicated intertrochanteric-subtrochanteric-type fracture right      hip.  2. Previous left total knee arthroplasty.   POSTOPERATIVE DIAGNOSES:  1. A complicated intertrochanteric-subtrochanteric-type fracture right      hip.  2. Previous left total knee arthroplasty.   OPERATION:  1. Reduction in trochanteric nail of the right hip.  2. Two Dall-Miles wire fixation right hip.  3. Bone graft right hip.  We utilized Grafton and cancellus bone      chips.   PROCEDURE:  Under general anesthesia, a gentle reduction was attempted  on the fracture table by way of use of the C-arm.  Note she had a very  complicated fracture.  She had a comminuted intertrochanteric-  subtrochanteric-type fracture.  This was noted mainly when the fracture  site was exposed.  Her proximal femur was flexed.  We initially made a  small incision over the greater trochanter and utilized a guide pin and  an awl and made a small entrance hole; and then utilized our reamer for  a further entrance hole to the greater trochanter.  We then inserted a  guide pin and noted that we could not get the hip fracture reduced  because of the acute flexion, so I elected to open the fracture  distally.  Even after opening the fracture it was very difficult,  because the fracture site was buttonholed through the soft tissue; and  it was quite tense and flexed.  I then let the traction off, reduced the  fracture as best I could, and then put a [place] claw clamp around the  fracture and  held it in position.   We then proceeded with our reaming.  We reamed up to a size 11 nail with  the femur.  We inserted our trochanteric nail in the usual fashion, but  when we did we noticed that the unrecognized fracture on x-ray,  literally, was split.  It was split longitudinally.  So we did have a  splint; and so we elected to put two dull Miles wires around the  fracture site for fixation and we inserted our compression screw in the  usual fashion through the trochanteric nail, up into the femoral head.  We then inserted a screw distally for distal fixation.   Note, this was a very complicated fracture, that is why we elected to  utilize two dull Miles wires and bone grafting in the proximal femoral  fracture site.  She may eventually need a reconstructive-type total hip  prosthesis in the future.  We thoroughly irrigated out the area.  We  utilized some thrombin spray, as well as thrombin-soaked  Gelfoam, closed  the wound in layers in the usual fashion over a Hemovac drain.           ______________________________  Georges Lynch. Darrelyn Hillock, M.D.     RAG/MEDQ  D:  07/20/2006  T:  07/21/2006  Job:  [id number]

## 2010-09-29 NOTE — Letter (Signed)
March 19, 2006    Windy Fast A. Darrelyn Hillock, M.D.  [facility]  [address], [address]  [place], [place] [address]   RE:  MARLOWE, CINQUEMANI  MRN:  [id number]  /  DOB:  12-28-1940   Dear Dr. Darrelyn Hillock:   Ms. Sweeten came in to see me on March 19, 2006, for surgical clearance  for a left knee replacement.  As you know, she is a 70 year old white female  with a past medical history of hypertension, hyperlipidemia and  osteoporosis.  She has a past surgical history of a hysterectomy in 1970,  gallbladder surgery and appendectomy, and states she has never had  perioperative complications.  She has no personal history or family history  for pulmonary embolus or other clotting disorders.   SOCIAL HISTORY:  She is a former smoker that quit in 1967.   She has had a stress test in March of 2007 which was negative.   PHYSICAL EXAMINATION:  Her blood pressure was slightly elevated today at  148/92.  The patient states she is in pain.  Her pulse was 88, weight was  160.6.  Physical exam was essentially normal with the exception of slightly elevated  blood pressure secondary to pain, and pain in her left knee with  weightbearing and palpation.   EKG was done, showed no changes from previous EKGs.  Labs have been done  previous to this appointment.   Ms. Morabito is a 70 year old with history of hypertension, hyperlipidemia,  who is a reasonable risk for surgery, okay to proceed with a total knee  replacement of the left knee.  If any help is needed while the patient is in  the hospital, please feel free to contact our Hospital Service.    Sincerely,      Lelon Perla, DO  Electronically Signed    Shawnie Dapper  DD: 03/20/2006  DT: 03/20/2006  Job #: [id number]

## 2010-09-29 NOTE — H&P (Signed)
[name], [name]              ACCOUNT NO.:  [id number]   MEDICAL RECORD NO.:  [id number]           PATIENT TYPE:   LOCATION:                                 FACILITY:   PHYSICIAN:  Georges Lynch. Gioffre, M.D.DATE OF BIRTH:  06-Jan-1941   DATE OF ADMISSION:  04/17/2006  DATE OF DISCHARGE:                                HISTORY & PHYSICAL   CHIEF COMPLAINT:  Painful left knee.   HISTORY OF PRESENT ILLNESS:  The patient is a 70 year old female here today  for her prehospital evaluation for the surgery.  The patient has been having  some issues for quite awhile in her left knee with painful range of motion.  She initially had a large meniscus tear with arthritic changes and  arthroscopy was performed.  She did have some very minimal improvement but  her knee pain progressively worsened over time and is now present constantly  with difficulty with range of motion.  X-rays reveal complete bone on bone  with cystic changes of the medial compartment of her left knee.  The patient  would like to proceed with a total knee replacement.   ALLERGIES:  SULFA and PENICILLIN.   CURRENT MEDICATIONS:  1. Vicodin 5 mg 1 or 2 tablets every 4-6 hours p.r.n.  2. Crestor 2.5 mg every other day.  3. Apotex capsule 20 mg once a day.  4. Lisinopril/hydrochlorothiazide 20/25 mg once a day.  5. Potassium chloride ER tablets 10 mg once a day.  6. One A Day multivitamins and vitamin C 1000 mg time release capsules      once a day.  7. Multivitamin Senior capsules once a day.  8. Fosamax 70 mg once a week.   PAST MEDICAL HISTORY:  1. Includes well-controlled hypertension.  2. Small hiatal hernia.  3. History of a kidney stone in 1960.  4. She has been tested positive for tuberculosis but is negative.  Contact      was her grandmother.  5. Osteoporosis.  6. Hypercholesterolemia.   PAST SURGICAL HISTORY:  Includes hysterectomy in 1970, gallbladder surgery  in 1984 without any complications.   REVIEW  OF SYSTEMS:  Is negative for any cardiac, respiratory, GI, GU,  hematologic issues at this time.   FAMILY MEDICAL HISTORY:  Mother is deceased from complications of heart  disease.  Father has known some cardiac disease but no other significant  issues.   SOCIAL HISTORY:  The patient is a widow.  She works as a Naval architect.  She does not smoke or drink.  She has two grown children. She will be taken  care of by her daughter after surgery. She lives in a [place] house.   PHYSICAL EXAMINATION:  VITALS:  Height is 5 feet 2 inches, weight is 160  pounds, blood pressure is 132/88, pulse of 76, respirations 12, patient is  afebrile.  GENERAL: This is a healthy-appearing, well-developed female conscious,  alert, and appropriate.  She does have obvious varus deformity of her right  knee with ambulation and she does walk with a limp and she is able to get  herself  on and off the exam table.  HEENT: Head was normocephalic.  Pupils equal, round and reactive.  Extraocular movements intact.  Sclerae anicteric.  Oral buccal mucosa is  pink and moist.  Gross hearing is intact.  NECK:  Supple.  No palpable lymphadenopathy.  Thyroid region was nontender.  She had excellent range of motion.  CHEST:  Lung sounds were clear and equal bilaterally.  No wheezes, rales,  rhonchi.  HEART: Regular rate and rhythm.  No murmurs, rubs or gallops.  EXTREMITIES: Upper extremities were symmetrically sized and shape.  She had  good range of motion of shoulders, elbows and wrists.  Motor strength 5/5.  LOWER EXTREMITIES:  Bilateral hips have full extension and flexion to 130  with 20 degrees internal-external rotation without any difficulty. The lower  bilateral knees were round, boggy appearing, no signs of erythema or  ecchymosis. Left knee was lacking about 5 degrees extension, flexion only  back to 100 degrees. Right knee had full extension, flexion back to 115  degrees. The left knee did have some  valgus varus laxity. Right knee was  stable. Calves were soft and nontender.  Ankles were symmetrical with good  dorsi plantar flexion.  PERIPHERAL VASCULAR:  Carotid pulses were 2+ no bruits.  Radial pulses were  2+, dorsalis pedis pulses were 1+. She had no lower extremity edema.  NEURO: The patient was conscious, alert and appropriate, good historian.  No  gross neurologic defects noted.  RECTAL/GU: Exams were deferred at this time.   IMPRESSION:  1. End-stage osteoarthritis, bone on bone medial compartment with varus      deformity.  2. Hypertension.  3. History of hiatal hernia.  4. History of kidney stone.  5. Hypercholesterolemia.  6. Osteoporosis.  7. History of positive TB test due to contact, negative disease.   PLAN:  The patient will undergo all routine labs and tests prior to having a  left total knee arthroplasty by Georges Lynch. Darrelyn Hillock, M.D. at [facility] on December 5.  The patient has been to her primary care physician  and ultimately did have a cardiac stress test which was normal with an  estimated ejection fraction at 76%.  The patient has been cleared by Dr.  Myrtis Ser for this upcoming surgical procedure.      Jamelle Rushing, P.A.    ______________________________  Georges Lynch Darrelyn Hillock, M.D.    RWK/MEDQ  D:  04/01/2006  T:  04/01/2006  Job:  [id number]

## 2010-09-29 NOTE — Discharge Summary (Signed)
NAMEASMARA, BACKS              ACCOUNT NO.:  [id number]   MEDICAL RECORD NO.:  [id number]          PATIENT TYPE:  INP   LOCATION:  1621                         FACILITY:  [facility]   PHYSICIAN:  Georges Lynch. Gioffre, M.D.DATE OF BIRTH:  1940/05/29   DATE OF ADMISSION:  07/20/2006  DATE OF DISCHARGE:  07/24/2006                               DISCHARGE SUMMARY   ADMISSION DIAGNOSES:  1. Complex fracture, intertrochanteric/subtrochanteric, right hip.  2. Recent left total knee arthroplasty.  3. Hypertension.  4. History of hiatal hernia.  5. History of kidney stones.  6. History of positive testing and tuberculosis with negative      symptoms.  7. Osteoporosis.  8. Hypercholesterolemia.   DISCHARGE DIAGNOSES:  1. Open reduction/internal fixation with intramedullary nailing,      abdominal caval grafting with cancellous bone of right      intertrochanteric/subtrochanteric fracture.  2. Acute postoperative blood loss anemia.  Hemoglobin went from 12.1      to 6.5 intraoperatively.  Recovered with 4 units of packed red      blood cells, tolerating well without any complications.  3. Hypertension.  4. Hypercholesterolemia.  5. Osteoporosis.  6. History of hiatal hernia.  7. History of kidney stones.  8. History of positive tuberculosis test with negative symptoms.   HISTORY OF PRESENT ILLNESS:  The patient is a 70 year old female  familiar to the practice, recently had a left total knee arthroplasty by  Dr. Darrelyn Hillock.  Been having some right-sided hip pain over the last  several months, grossly worsened over the last weeks.  Initial  evaluations in the office were negative for any obvious fractures or  problems.  The patient was out ambulating on the date of admission when  she stepped off the curb and twisted her leg.  She felt a pop before she  hit the ground.  She had severe pain with deformity of the hip.  Was  unable to ambulate and was brought to the emergency room, found to  have  a complicated fracture of her right intertrochanteric/subtrochanteric  region.  The patient was taken to the OR for an ORIF.   MEDICATIONS ON ADMISSION:  1. Lisinopril/hydrochlorothiazide 20 mg/25 mg daily.  2. Celebrex 200 a day.  3. Darvocet-N 100 p.r.n.  4. Potassium 10 mEq p.o. daily.  5. __________ 1 tablet a day.  6. Multivitamins 1 tablet a day.  7. Vitamin C 1 tablet a day.   ALLERGIES:  PENICILLIN, SULFA, MORPHINE.   SURGICAL PROCEDURE:  On July 20, 2006, patient was taken to the OR by  Dr. Worthy Rancher, assisted by Oneida Alar, PA-C.  Under general anesthesia,  the patient underwent an open reduction/intertrochanteric nail of the  right hip with repair of trochanteric fracture with two Dall-Miles wire  fixation cables with supplementing fracture regions with bone graft and  cancellous bone chips.  Patient tolerated the procedure well.  She had  an estimated 900 ml of blood loss.  Was transferred to the recovery room  in good condition and then to the orthopedic floor for postop care.   CONSULTS:  The  following routine consults were requested:  Physical  therapy and case management.   HOSPITAL COURSE:  On July 20, 2006, patient was evaluated in the  emergency room at [facility].  Admitted to Dr. Jeannetta Ellis  service.  Patient was found to have a complicated fracture on the  sub/subtroch on the right hip.  She was taken to the OR, where ORIF was  performed with an intramedullary trochanteric nail, Dall-Miles wires  were grafted in bone graft, and cancellous bone.  The patient tolerated  the procedure well.  She had an estimated 800-900 cc of blood loss.  Preoperative, her blood hemoglobin was 12.1.  Postoperatively in the  recovery room, she was 6.5.  She was slightly tachycardic with a little  bit of low pressure, so she was typed, crossed, and transfused, and she  received a total of 4 units of packed red blood cells with her  hemoglobin improving to 11.4.   Over the next couple of days, it drifted  down to 10.9 at a low point, then with fluid balancing, she stabilized  and was discharged at 11.3.  Patient's vital signs improved.  She  stabilized.   Patient was placed on DVT prophylaxis with Coumadin.  Her INR on the  date of discharge is 2.5 with a dose of 5 mg a day of Coumadin.  The  patient's right hip is moderately swollen, which was to be expected.  No  signs of infection.  Her wound was well approximated with staples.  She  had some chronic serosanguineous discharge.  The patient was maintained  on a total nonweightbearing status on her right lower extremity.  The  majority of the time, she was bed-bound and with positioning for comfort  and then slowly gradually will be increasing her to bed-to-chair, still  nonweightbearing.   On postop day #5, she was felt to be orthopedically and medically  stable, ready for discharge.  We feel that she is going to need a  significant amount of medical assistance while she goes through the  recovery phase, while we let the bone start healing prior to increasing  ambulation.  So arrangements were made for skilled nursing facility.  She was accepted on this date, and she will be discharged on that  facility in good condition.   LABS:  CBC on admission:  WBC 9.8, hemoglobin 12.1, hematocrit 36.2,  platelets 364.  Postoperative, her hemoglobin dropped to 6.5.  After 4  units of packed red blood cells, her H&H improved to 11.4 and 33.  Her  H&H on the date of discharge was 11.3/32.9.  Her INR is 2.5.  Urinalysis  on discharge was normal.  She received a total of 4 units of packed red  blood cells.   EKG on March 8th at 6:00 p.m. found a normal sinus rhythm at 100 beats  per minute.   DISCHARGE INSTRUCTIONS:  1. Diet:  No restrictions.  2. Wound care:  Patient should have wound checked daily.  Dressing     will be changed several times a day due to serosanguineous      discharge.  Staples are to  be left in place until our followup in      the office in two weeks.  3. Activity:  TOTAL NONWEIGHTBEARING of right lower extremity.  No      physical therapy activities of the right hip other than to support      the hip when transferring from bed to chair  or positioning when in      bed.  Patient must have 2-3 people assisting during this process to      support the lower extremity.  4. Followup:  Patient needs a follow-up appointment in Dr. Jeannetta Ellis      office two weeks from the date of the surgical procedure, so that      will be on or about March 24th.  Please call [phone] for this      appointment.   MEDICATIONS:  1. Duragesic patch 25 mg an hour, 1 patch q.72h.  2. OxyIR 5 mg tablets 1-3 tablets p.o. q.4-6h. p.r.n. pain.  3. Robaxin 500 mg p.o. q.6h. p.r.n.  4. Coumadin 5 mg daily to maintain an INR of 2 to 2.5 for a total of      30 days or until Dr. Darrelyn Hillock DC's.  5. Trinsicon 1 capsule p.o. t.i.d.  6. Lisinopril 20 mg a day.  7. Hydrochlorothiazide 25 mg a day.  8. Potassium chloride 10 mEq p.o. daily.  9. Protonix 40 mg p.o. daily.  10.Multivitamins with mineral 1 tablet daily.  11.Ascorbic acid 500 mg p.o. daily.  12.Reglan 10 mg p.o. q.8h. p.r.n.  13.Phenergan 25 mg p.o. q.6h. p.r.n.  14.Benadryl 25 mg p.o. q.6h. p.r.n.  15.Tylenol 650 mg p.o. q.4h. p.r.n.  16.Milk of magnesia p.r.n.   Foley catheter may be left in as long as close monitoring for any  infection due to patient's current immobile state.   Patient's condition upon discharge to skilled nursing facility is listed  as improved and good.      Jamelle Rushing, P.A.    ______________________________  Georges Lynch Darrelyn Hillock, M.D.    RWK/MEDQ  D:  07/24/2006  T:  07/25/2006  Job:  [id number]

## 2010-09-29 NOTE — Discharge Summary (Signed)
[name], [name]              ACCOUNT NO.:  [id number]   MEDICAL RECORD NO.:  [id number]          PATIENT TYPE:  INP   LOCATION:  1509                         FACILITY:  [facility]   PHYSICIAN:  Georges Lynch. Gioffre, M.D.DATE OF BIRTH:  [birth year]   DATE OF ADMISSION:  04/17/2006  DATE OF DISCHARGE:  04/20/2006                               DISCHARGE SUMMARY   ADMISSION DIAGNOSIS:  1. End-stage osteoarthritis left knee.  2. Hypertension.  3. History of hiatal hernia.  4. History of kidney stones.  5. History of a positive test for tuberculosis, asymptomatic.  6. History of osteoporosis.  7. History of hypercholesterolemia.   DISCHARGE DIAGNOSIS:  1. Left total knee arthroplasty.  2. Postoperative hypokalemia.  3. History of hypertension.  4. History of hiatal hernia.  5. History of kidney stones.  6. History of positive tuberculosis test with negative symptoms.  7. History of osteoporosis.  8. History of hypercholesterolemia.   HISTORY OF PRESENT ILLNESS:  The patient is a 70 year old female with a  history of painful left knee with an evaluation with an arthroscopy  which __________ arthritic changes, meniscal  tear, __________.  The  patient did not significantly improve; her pain progressively worsened,  some worsening loss of joint space and became bone on bone with some  changes on the medial compartment on x-rays.  The patient's pain was  under control with several treatments so she would like to proceed with  a total knee arthroplasty.   ALLERGIES TO MEDICATIONS:  Sulfa and penicillin.   MEDICATIONS ON ADMISSION:  1. Vicodin p.r.n.  2. Crestor 2.5 milligrams every other day.  3. Apotex capsule 20 mg a day.  4. Lisinopril/hydrochlorothiazide 20/25 milligrams a day.  5. Potassium 10 milliequivalents a day.  6. Once a day multivitamins with vitamin C.  7. Senior multivitamins once a day.  8. Fosamax 70 mg a week.   SURGICAL PROCEDURE:  On April 17, 2006, the  patient was taken to the  operating room by Dr. Georges Lynch. Gioffre, assisted by __________PAC,  under general anesthesia.  The patient underwent a left total knee  arthroplasty __________.  The patient tolerated the procedure well.  There were no complications.  The patient was transferred to the  recovery room, orthopedic floor, in good condition.  The patient had the  following components implanted:  A size 2.5 left femoral component, a  size 38 millimeter 3 peg patella, size 2.5 medial  tibial tray, a size  2.5, 12.5 millimeter __________.  Components were implanted with  polymethyl methacrylate  with vancomycin impregnation.   CONSULTATIONS:  The following routine consults requested:  1. Physical therapy.  2. Case management.  3. Pharmacy for Coumadin dosing.   HOSPITAL COURSE:  On April 17, 2006, the patient was admitted to  [facility] in the care of Dr. Ranee Gosselin.  The patient was  taken to the operating room where a left total knee arthroplasty was  performed.  The patient tolerated the procedure well and was transferred  to the recovery room and then to the orthopedic floor for a routine  total knee protocol on intravenous antibiotics, pain medications, and  Coumadin and heparin for deep venous thrombosis prophylaxis.  The  patient had a total of 3 days of postoperative care which the patient  did develop some slight hypokalemia.  This was corrected with per mouth  replacements.  The patient tolerated routine postoperative blood loss  without any significant complications and no transfusions required.  The  patient's vital signs otherwise remained stable.  The patient remained  afebrile.  The patient's wounds remained benign for any signs of  infection.  Her leg remained neuromotor and vascularly intact.  The  patient worked well with physical therapy and was seen again and it was  felt on postoperative day #3, she was orthopedically stable and  medically  stable and ready for discharge home ___________ physical  therapy protocol, and she was discharged in good condition.   LABORATORY:  H&H on December 8:  Hemoglobin 9.3, hematocrit 26.7.  INR  on December 8 was 1.8.  Routine chemistries on December 8 found sodium  136; potassium 3.6 with low of 3.2 the day previous with per mouth  replacement; glucose 11, BUN 5; creatinine 0.5.  Routine urinalysis on  admission was normal.   MEDICATIONS ON DISCHARGE:  Remarkable for:  1. Heparin 5000 units subcutaneous every 12 hours.  2. Ferrous sulfate 325 milligrams by mouth three times a day.  3. Colace 100 mg p.o. b.i.d.  4. Percocet 1 or 2 tablets every 4-6 hours as needed.  5. Reglan 10 milligrams by mouth every 8 hours as needed.  6. __________ 25 milligrams by mouth every 6 hours as needed.  7. Robaxin 500 milligrams by mouth every 6 hours as needed.  8. __________ 40 milligrams by mouth daily.  9. Lisinopril 20 milligrams a day.  10.Hydrochlorothiazide 25 milligrams a day.  11.Potassium chloride 10 milliequivalents a day with an extra 20      milliequivalents while in the hospital to bump up potassium.  It      was resumed to 10 milliequivalents a day upon discharge.  12.Crestor 2.5 milligrams a day.   DISCHARGE INSTRUCTIONS:  1. Diet:  No restrictions.  2. Activity:  Patient can walk with assistance of a walker.  3. Wound care:  The patient is to change dressings daily.  4. Medications:  The patient is to resume home medications with the      addition of Percocet 1 or 2 tablets every 4-6 hours for pain.  5. Robaxin 500 milligrams 1 tablet every 6 hours for muscle spasms.  6. Coumadin 5 milligrams a day until changed by pharmacy.  7. The patient is to follow up with Dr. Darrelyn Hillock in 2 weeks from date      of surgery.  The patient needs to call for an appointment [phone].  8. _________ health care is by [facility], physical therapy, and     registered nurse for Coumadin monitoring and  management.   CONDITION ON DISCHARGE:  The patient's condition on discharge _________.      Jamelle Rushing, P.A.    ______________________________  Georges Lynch Darrelyn Hillock, M.D.    RWK/MEDQ  D:  05/15/2006  T:  05/15/2006  Job:  [id number]   cc:   Windy Fast A. Darrelyn Hillock, M.D.  Fax: [phone]

## 2010-09-29 NOTE — Discharge Summary (Signed)
[name], [name]              ACCOUNT NO.:  [id number]   MEDICAL RECORD NO.:  [id number]          PATIENT TYPE:  IPS   LOCATION:  4033                         FACILITY:  [name]   PHYSICIAN:  [name], M.D.   DATE OF BIRTH:  08/09/1940   DATE OF ADMISSION:  07/29/2006  DATE OF DISCHARGE:  08/07/2006                               DISCHARGE SUMMARY   DISCHARGE DIAGNOSES:  1. Right intertrochanteric/subtrochanteric hip fracture, open      reduction and internal fixation with intramedullary nail, July 20, 2006.  2. Pain control.  3. Postoperative anemia.  4. Left total knee arthroplasty 04/2006.  5. Hypertension.  6. Gastroesophageal reflux disease.  7. Coumadin for deep vein thrombosis prophylaxis.   This is a 70 year old female with history of a left total knee  replacement 04/2006.  Noted recent intertrochanteric subtrochanteric  right hip fracture.  Underwent open reduction and internal fixation with  intramedullary nailing on July 20, 2006.  Placed on Coumadin for deep  vein thrombosis prophylaxis nonweightbearing.  She was [facility] to  [name] 07/24/2006 and readmitted 03/13 with  increased right hip pain.  Followup x-rays 3 views with good position  and alignment.  No further surgical intervention required.  She remain  nonweightbearing.   PAST MEDICAL HISTORY:  See discharge diagnoses.  No alcohol.  Remote  smoker.   ALLERGIES:  PENICILLIN, SULFA, MORPHINE AND LIPITOR.   SOCIAL HISTORY:  Lives alone in [name].  Daughter in area can  assist.  One-level home, one step to entry.   MEDICATIONS PRIOR TO ADMISSION:  1. Coumadin 5 mg daily.  2. Robaxin 500 mg every 6 hours.  3. Benadryl as needed.  4. Vitamin C daily.  5. Roxicet 5/535 as needed.  6. Hydrochlorothiazide 25 mg daily.  7. Potassium chloride 10 mEq daily.  8. Prilosec 20 mg daily.  9. Multivitamin daily.  10.Duragesic patch 25 mcg, change every 72 hours.  11.Oxycodone as needed for breakthrough pain.  12.Colace twice daily.  13.Lisinopril 20 mg daily.   REHABILITATION HOSPITAL COURSE:  The patient was admitted to inpatient  rehab services with therapies initiated on a 3-hour daily basis  consisting of physical therapy, occupational therapy and rehabilitation  nursing.  The following issues were addressed during the patient's  rehabilitation stay.   Pertaining to [name]'s right intertrochanteric/subtrochanteric hip  fracture, surgical site healing nicely.  She remained nonweightbearing.  Recent followup x-rays showed good position and alignment.  She would  follow up with [name].  Pain control ongoing with the use of a  Duragesic patch which had been increased to 50 mcg and recently  decreased back to 25 as prior to admission.  She was also using  oxycodone for breakthrough pain.  She was weaned off sustained-release  OxyContin.  She remained on Coumadin for deep vein thrombosis  prophylaxis with latest INR of 2.4.  No bleeding episodes.  She will  complete Coumadin protocol.  Postoperative anemia stable with hemoglobin  12.1.  Her iron supplement was discontinued.   Blood pressures monitored with no  orthostatic changes.  She remained on  hydrochlorothiazide and lisinopril and would follow up with medical  management per her primary MD, [name].  She had a recent left total  knee arthroplasty December 2007.  It was at her request that a CPM  machine was added.  Again she would follow up with orthopedic services.  Overall for her functional status she was ambulating minimal assist with  a rolling walker, nonweightbearing to the right lower extremity, minimal  assist for transfers, supervision for bathing and dressing, toileting  and shower transfers.  Overall her strength and endurance had greatly  improved.  She was encouraged with her overall progress and discharged  to home.   Latest labs showed an INR of 2.4,  hemoglobin 12.1, hematocrit 35.3,  platelet 707,000.  Sodium 137, potassium 3.4, BUN 90, creatinine 0.6.   DISCHARGE MEDICATIONS AT THE TIME OF DICTATION:  1. Coumadin 5 mg daily.  2. Duragesic patch 25 mcg, change every 72 hours.  3. Hydrochlorothiazide 25 mg daily.  4. Lisinopril 20 mg daily.  5. Prilosec 20 mg daily.  6. Potassium chloride 10 mEq daily.  7. OxyContin sustained release 10 mg every 12 hours times 1 week then      stop.  8. Robaxin 500 mg every 6 hours as needed for spasms.  9. Oxycodone immediate release 5 mg one or two tablets every 4 hours      as needed for pain.  10.Vitamin C 500 mg daily.  11.Multivitamin daily.   Non-weightbearing right lower extremity.  Followup with [name] of  orthopedic service, call for appointment, and [name].      [name], P.A.    ______________________________  [name], M.D.    DA/MEDQ  D:  08/06/2006  T:  08/06/2006  Job:  [id number]   cc:   [name], M.D.  Miranda Perla, DO  [name], M.D.

## 2010-09-29 NOTE — Op Note (Signed)
NAMEBRIANN, [name]              ACCOUNT NO.:  [id number]   MEDICAL RECORD NO.:  [id number]          PATIENT TYPE:  INP   LOCATION:  0005                         FACILITY:  [facility]   PHYSICIAN:  Georges Lynch. Gioffre, M.D.DATE OF BIRTH:  1940-12-24   DATE OF PROCEDURE:  04/17/2006  DATE OF DISCHARGE:                               OPERATIVE REPORT   SURGEON:  Georges Lynch. Darrelyn Hillock, M.D.   ASSISTANT:  Arlyn Leak, PA   PREOPERATIVE DIAGNOSIS:  Severe degenerative arthritis of the left knee  with a genu varus deformity.   POSTOPERATIVE DIAGNOSIS:  Severe degenerative arthritis of the left knee  with a genu varus deformity.   OPERATION:  Left total knee arthroplasty utilizing the DePuy system.  All three components were cemented.  I utilized the rotating platform  tibial insert.  Sizes used was femur size 2.5 left posterior cruciate  sacrificing femoral component.  The tibial tray was a size 2.5 tibial  tray.  The patella was a size 38 mm patella.  The tibial insert was a  2.5 mm 12 mm thickness.  Vancomycin was used in the cement.   PROCEDURE:  Under general anesthesia, routine orthopedic prep and  draping of the left lower extremity carried out.  Leg exsanguinated with  Esmarch, tourniquet was elevated 350 mmHg.  At this time the knee was  flexed.  An incision was made over the anterior aspect of left knee.  Preop the patient had 1 gram of vancomycin IV since she is allergic to  penicillin.  Two flaps were created.  Self-retaining retractors were  inserted.  Knee was flexed.  I then carried out a median parapatellar  incision reflecting patella laterally and flexed the knee, did medial  and lateral meniscectomies and excised the anterior posterior cruciate  ligament and did a thorough synovectomy.  Initial drill holes made the  intercondylar notch. Intramedullary guide rod was inserted and we  removed 10 mm thickness off the distal femur.  Following that the #2 jig  was inserted and we  then took the appropriate measurements and decided  to use a 2.5 mm femoral cut.  The next jig was inserted.  We carried out  anterior posterior chamfer cuts for a size 2.5 mm distal femur.  Following that we then prepared the tibia.  We measured the tibial  plateau to be a size 2.5.  We then made the appropriate drill holes in  the tibia plateau.  We utilized the intramedullary guide and removed 6  mm thickness off the affected medial side.  We did cut our keel cut as  well in the tibial plateau.  Following this we and inserted our trial  components after we made our notch cut out of the distal femur in the  usual fashion.  We had excellent fit with a 12.5 mm thickness tibial  insert, the 10 mm thickness insert was a little loose, so we went to  12.5.  We did utilize a rotating platform prosthesis.  Following that we  took the appropriate measurements of the patella for a size 38 mm  patella.  We  did a routine resurfacing procedure on the patella.  Three  drill holes were made in the patella.  Thoroughly irrigated the knee  after all trial components removed, dried the knee out made sure we had  good posterior debridement as well of the knee.  There were no other  loose fragments noted.  We then cemented all three components in  simultaneously utilizing vancomycin and cement.  Following that we  removed all loose pieces of cement.  We removed our trial 10 mm  thickness insert and then decided to use a 12.5 mm thickness insert.  We  made sure we had no other loose pieces of cement present.  We thoroughly  irrigated out the knee, once again inspected for loose pieces cement.  We then inserted our permanent 12-mm thickness rotating platform size  2.5.  We reduced the knee, took the knee through motion had excellent  stability.  Patella tracked well in the midline as well.  We had good  medial and lateral stability, good flexion and extension.  Thoroughly  irrigated out the knee again and  prior to inserting our permanent tibial  insert, we did inject the knee with 30 mL after Marcaine epinephrine 30  mg of  Toradol.  After that we then utilized a thrombin spray to decrease  bleeding.  A drain was inserted and the knee was closed in layers usual  fashion.  Sterile Neosporin dressing was applied.  The patient left the  operative satisfactory position.           ______________________________  Georges Lynch Darrelyn Hillock, M.D.     RAG/MEDQ  D:  04/17/2006  T:  04/17/2006  Job:  [id number]   cc:   Dr. Myrtis Ser

## 2010-10-04 ENCOUNTER — Other Ambulatory Visit: Payer: Self-pay | Admitting: Family

## 2010-10-05 ENCOUNTER — Encounter: Payer: Self-pay | Admitting: Family

## 2010-10-05 LAB — BASIC METABOLIC PANEL
CO2: 23 mEq/L (ref 19–32)
Calcium: 9.3 mg/dL (ref 8.4–10.5)
Chloride: 100 mEq/L (ref 96–112)
Sodium: 133 mEq/L — ABNORMAL LOW (ref 135–145)

## 2010-10-05 LAB — LIPID PANEL
Cholesterol: 203 mg/dL — ABNORMAL HIGH (ref 0–200)
HDL: 52 mg/dL (ref >39)
LDL Cholesterol: 131 mg/dL — ABNORMAL HIGH (ref 0–99)
Total CHOL/HDL Ratio: 3.9 Ratio
Triglycerides: 98 mg/dL (ref <150)
VLDL: 20 mg/dL (ref 0–40)

## 2010-10-10 ENCOUNTER — Encounter: Payer: Self-pay | Admitting: Family

## 2010-10-10 ENCOUNTER — Ambulatory Visit (INDEPENDENT_AMBULATORY_CARE_PROVIDER_SITE_OTHER): Payer: Medicare PPO | Admitting: Family

## 2010-10-10 DIAGNOSIS — R7301 Impaired fasting glucose: Secondary | ICD-10-CM

## 2010-10-10 DIAGNOSIS — I10 Essential (primary) hypertension: Secondary | ICD-10-CM

## 2010-10-10 DIAGNOSIS — E7800 Pure hypercholesterolemia, unspecified: Secondary | ICD-10-CM

## 2010-10-10 MED ORDER — POTASSIUM CHLORIDE 10 MEQ PO TBCR: 10.0000 meq | EXTENDED_RELEASE_TABLET | Freq: Every day | ORAL | Status: DC

## 2010-10-10 MED ORDER — CALCITONIN (SALMON) 200 UNIT/ACT NA SOLN: 1.0000 | Freq: Every day | NASAL | Status: DC

## 2010-10-10 MED ORDER — AMLODIPINE BESYLATE 5 MG PO TABS: 5.0000 mg | ORAL_TABLET | Freq: Every day | ORAL | Status: DC

## 2010-10-10 MED ORDER — EZETIMIBE 10 MG PO TABS: 10.0000 mg | ORAL_TABLET | Freq: Every day | ORAL | Status: DC

## 2010-10-10 MED ORDER — QUINAPRIL-HYDROCHLOROTHIAZIDE 20-25 MG PO TABS: 1.0000 | ORAL_TABLET | Freq: Every day | ORAL | Status: DC

## 2010-10-10 NOTE — Patient Instructions (Signed)
Keep up the good work    Follow up in 6 months

## 2010-10-10 NOTE — Assessment & Plan Note (Signed)
A1C is improved at 6.0.  Continue diet/exercise.

## 2010-10-10 NOTE — Assessment & Plan Note (Signed)
>>  ASSESSMENT AND PLAN FOR HYPERCHOLESTEROLEMIA WRITTEN ON 10/10/2010  8:54 AM BY O'SULLIVAN, Genea Rheaume, NP  Cholesterol looks fantastic. Continue zetia , and dietary modification.

## 2010-10-10 NOTE — Assessment & Plan Note (Signed)
>>  ASSESSMENT AND PLAN FOR ESSENTIAL HYPERTENSION WRITTEN ON 10/10/2010  8:53 AM BY O'SULLIVAN, Kush Farabee, NP  BP Readings from Last 3 Encounters:  10/10/10 128/80  08/01/10 136/76  07/04/10 140/84   BP looks great, continue current meds.

## 2010-10-10 NOTE — Assessment & Plan Note (Signed)
Cholesterol looks fantastic. Continue zetia, and dietary modification.

## 2010-10-10 NOTE — Assessment & Plan Note (Addendum)
BP Readings from Last 3 Encounters:  10/10/10 128/80  08/01/10 136/76  07/04/10 140/84   BP looks great, continue current meds.

## 2010-10-10 NOTE — Progress Notes (Signed)
Subjective:    Patient ID: [name], female    DOB: 1941/02/22, 70 y.o.   MRN: [id number]  HPI  [name] is a 70 yr old female who presents today for follow up.  1. HTN- reports + compliance with a low sodium diet.   2. DM2 (borderline)- has been working hard at cutting back on her carbs/sugars  3. Overweight-  Has lost a total of 30 lbs with diet, and exercise.    Review of Systems  Respiratory: Negative for shortness of breath.   Cardiovascular: Negative for chest pain and leg swelling.      see hpi  Past Medical History  Diagnosis Date  . Hyperlipidemia   . Hypertension   . Osteoporosis     pt cannot tolerate fosamax  . Arthritis     osteoarthritis  . Barrett's esophagus   . GERD (gastroesophageal reflux disease)   . Chronic obstructive bronchitis     Dr Delford Field  . IFG (impaired fasting glucose)   . Colon polyps   . History of nephrolithiasis   . History of chicken pox   . Positive PPD     exposure to 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 Positive PPD only, negative CXR.  [name] History of transfusion of packed red blood cells   . Urinary incontinence     History   Social History  . Marital Status: Widowed    Spouse Name: N/A    Number of Children: N/A  . Years of Education: N/A   Occupational History  . Retired    Social History Main Topics  . Smoking status: Former Smoker    Quit date: 05/15/1967  . Smokeless tobacco: Never Used  . Alcohol Use: No  . Drug Use: No  . Sexually Active: Not on file   Other Topics Concern  . Not on file   Social History Narrative   Regular exercise: yes    Past Surgical History  Procedure Date  . Abdominal hysterectomy 1970  . Appendectomy   . Cholecystectomy   . Total knee arthroplasty 04/2006    total left knee replacement  . Leg surgery 2008    right    Family History  Problem Relation Age of Onset  . Asthma Mother   . Coronary artery disease    . Hyperlipidemia    . Hypertension    . Arthritis    .  Tuberculosis      Allergies  Allergen Reactions  . Atorvastatin     REACTION: MUSCLE PAINS  . Diclofenac-Misoprostol     REACTION: NVD  . Fenofibrate     REACTION: carpopedal spasms  . KGM:WNUUVOZDGUY+QIHKVQQVZ+DGLOVFIEPP Acid+Aspartame     REACTION: unspecified  . Penicillins     REACTION: welps  . Rosuvastatin     REACTION: MUSCLE PAINS  . Statins   . Sulfonamide Derivatives   . Tuberculin Ppd     Current Outpatient Prescriptions on File Prior to Visit  Medication Sig Dispense Refill  . amLODipine (NORVASC) 5 MG tablet Take 1 tablet (5 mg total) by mouth daily.  30 tablet  3  . Ascorbic Acid (VITAMIN C) 100 MG tablet Take 100 mg by mouth daily.        [name] aspirin 325 MG tablet Take 325 mg by mouth daily.        . beta carotene 15 MG capsule Take 15 mg by mouth daily.        . bifidobacterium infantis (ALIGN) capsule Take 1 capsule by mouth daily.        [name]  calcitonin, salmon, (MIACALCIN) 200 UNIT/ACT nasal spray 1 spray by Nasal route daily. Alternate nostrils.       . Coenzyme Q10 200 MG capsule Take 200 mg by mouth daily.        . colesevelam (WELCHOL) 625 MG tablet Take 1,875 mg by mouth. Take 3 tablets by mouth twice a day.       . ezetimibe (ZETIA) 10 MG tablet Take 10 mg by mouth daily.        . fluticasone (FLONASE) 50 MCG/ACT nasal spray 2 sprays by Nasal route daily. Each nostril.       [name] ibuprofen (ADVIL,MOTRIN) 200 MG tablet Take 200 mg by mouth. Take 1 tablet as needed for pain.       . Magnesium 500 MG TABS Take 1 tablet by mouth daily.        . Multiple Vitamin (MULTIVITAMIN) tablet Take 1 tablet by mouth daily.        . niacin 500 MG tablet Take 500 mg by mouth. Take 1 tablet by mouth at bedtime x 1 week, then 2 tablets at bedtime second week, then 3 tablets at bedtime starting on third week.       . Omega-3 Fatty Acids (FISH OIL) 1000 MG CAPS Take 2 capsules by mouth 2 (two) times daily.        [name] omeprazole (PRILOSEC) 20 MG capsule Take 20 mg by mouth 2 (two)  times daily.        . potassium chloride (KLOR-CON) 10 MEQ CR tablet Take 10 mEq by mouth daily.        . quinapril-hydrochlorothiazide (ACCURETIC) 20-25 MG per tablet Take 1 tablet by mouth daily.        . vitamin E (VITAMIN E) 400 UNIT capsule Take 400 Units by mouth daily.          BP 128/80  Pulse 90  Temp(Src) 98.3 F (36.8 C) (Oral)  Resp 16  Ht 5\' 2"  (1.575 m)  Wt 147 lb 1.3 oz (66.715 kg)  BMI 26.90 kg/m2    Objective:   Physical Exam  Constitutional: She appears well-developed and well-nourished.  Cardiovascular: Normal rate and regular rhythm.   Pulmonary/Chest: Effort normal and breath sounds normal.  Musculoskeletal: She exhibits no edema.  Psychiatric: She has a normal mood and affect. Her behavior is normal. Thought content normal.          Assessment & Plan:

## 2010-11-30 ENCOUNTER — Other Ambulatory Visit: Payer: Self-pay | Admitting: Family

## 2011-01-23 ENCOUNTER — Telehealth: Payer: Self-pay | Admitting: *Deleted

## 2011-01-23 NOTE — Telephone Encounter (Signed)
Pt returned my call and was advised that form is ready for pick up.

## 2011-01-23 NOTE — Telephone Encounter (Signed)
Received paper from pt for medication assistance for Zetia. Section 2 and 3 completed and signed. Form left at front desk for pt to pick up. Left message on machine for pt to return my call.

## 2011-02-12 ENCOUNTER — Other Ambulatory Visit: Payer: Self-pay | Admitting: Family

## 2011-02-14 ENCOUNTER — Other Ambulatory Visit: Payer: Self-pay | Admitting: Family

## 2011-02-26 LAB — BASIC METABOLIC PANEL
BUN: 2 — ABNORMAL LOW
BUN: 8
CO2: 26
CO2: 32
Calcium: 8.2 — ABNORMAL LOW
Calcium: 8.4
Chloride: 106
Chloride: 101
Creatinine, Ser: 0.51
Creatinine, Ser: 0.59
GFR calc Af Amer: >60
GFR calc Af Amer: >60
Glucose, Bld: 114 — ABNORMAL HIGH
Glucose, Bld: 131 — ABNORMAL HIGH
Potassium: 4.3
Sodium: 140

## 2011-02-26 LAB — TYPE AND SCREEN
ABO/RH(D): A POS
Antibody Screen: NEGATIVE

## 2011-02-26 LAB — CBC
HCT: 36.1
MCHC: 34.5
MCV: 88.2
Platelets: 458 — ABNORMAL HIGH
RBC: 4.1
WBC: 9.7

## 2011-02-26 LAB — URINALYSIS, ROUTINE W REFLEX MICROSCOPIC
Ketones, ur: NEGATIVE
Nitrite: NEGATIVE
Protein, ur: NEGATIVE
Urobilinogen, UA: 0.2
pH: 5.5

## 2011-02-26 LAB — PROTIME-INR
INR: 1
INR: 1.4
Prothrombin Time: 17.7 — ABNORMAL HIGH

## 2011-02-26 LAB — DIFFERENTIAL
Eosinophils Absolute: 1.1 — ABNORMAL HIGH
Lymphocytes Relative: 29
Lymphs Abs: 2.8
Monocytes Relative: 5
Neutrophils Relative %: 54

## 2011-02-26 LAB — HEMOGLOBIN AND HEMATOCRIT, BLOOD
HCT: 25.7 — ABNORMAL LOW
HCT: 26.2 — ABNORMAL LOW
Hemoglobin: 9 — ABNORMAL LOW

## 2011-02-26 LAB — COMPREHENSIVE METABOLIC PANEL
ALT: 21
Calcium: 9.8
GFR calc Af Amer: >60
Glucose, Bld: 132 — ABNORMAL HIGH
Sodium: 139
Total Protein: 7.3

## 2011-03-26 ENCOUNTER — Other Ambulatory Visit: Payer: Self-pay | Admitting: Family

## 2011-04-04 ENCOUNTER — Other Ambulatory Visit: Payer: Self-pay | Admitting: Family

## 2011-04-04 DIAGNOSIS — R7301 Impaired fasting glucose: Secondary | ICD-10-CM

## 2011-04-04 DIAGNOSIS — I10 Essential (primary) hypertension: Secondary | ICD-10-CM

## 2011-04-04 DIAGNOSIS — E7800 Pure hypercholesterolemia, unspecified: Secondary | ICD-10-CM

## 2011-04-04 LAB — LIPID PANEL
Cholesterol: 237 mg/dL — ABNORMAL HIGH (ref 0–200)
HDL: 63 mg/dL (ref >39)
LDL Cholesterol: 157 mg/dL — ABNORMAL HIGH (ref 0–99)
Triglycerides: 84 mg/dL (ref <150)
VLDL: 17 mg/dL (ref 0–40)

## 2011-04-04 LAB — BASIC METABOLIC PANEL
Chloride: 103 mEq/L (ref 96–112)
Creat: 0.64 mg/dL (ref 0.50–1.10)
Potassium: 4.7 mEq/L (ref 3.5–5.3)

## 2011-04-13 ENCOUNTER — Ambulatory Visit: Payer: Medicare PPO | Admitting: Family

## 2011-04-18 ENCOUNTER — Ambulatory Visit (INDEPENDENT_AMBULATORY_CARE_PROVIDER_SITE_OTHER): Payer: Medicare PPO | Admitting: Family

## 2011-04-18 ENCOUNTER — Encounter: Payer: Self-pay | Admitting: Family

## 2011-04-18 VITALS — BP 142/70 | HR 68 | Temp 97.8°F | Resp 16 | Ht 62.0 in | Wt 150.0 lb

## 2011-04-18 DIAGNOSIS — K219 Gastro-esophageal reflux disease without esophagitis: Secondary | ICD-10-CM

## 2011-04-18 DIAGNOSIS — R7301 Impaired fasting glucose: Secondary | ICD-10-CM

## 2011-04-18 DIAGNOSIS — E7800 Pure hypercholesterolemia, unspecified: Secondary | ICD-10-CM

## 2011-04-18 DIAGNOSIS — Z23 Encounter for immunization: Secondary | ICD-10-CM

## 2011-04-18 DIAGNOSIS — I10 Essential (primary) hypertension: Secondary | ICD-10-CM

## 2011-04-18 NOTE — Progress Notes (Signed)
Subjective:    Patient ID: [name], female    DOB: 09/05/1940, 70 y.o.   MRN: [id number]  HPI  HTN-  Pt reports that she is taking quinapril HCTZ and amlodipine without any problems.   Hyperlipidemia- Notes that she continues the Zetia.  Has not been that compliant with her diet.   IFG-  Her A1C is 6.0.  Has been doing water aerobics.  GERD/Barretts esophagus- takes the OTC prilosec. Reports that this is well controlle.d.      Review of Systems  Respiratory: Negative for shortness of breath.   Cardiovascular: Negative for chest pain.  Neurological: Negative for headaches.   See HPI  Past Medical History  Diagnosis Date  . Hyperlipidemia   . Hypertension   . Osteoporosis     pt cannot tolerate fosamax  . Arthritis     osteoarthritis  . Barrett's esophagus   . GERD (gastroesophageal reflux disease)   . Chronic obstructive bronchitis     Dr Delford Field  . IFG (impaired fasting glucose)   . Colon polyps   . History of nephrolithiasis   . History of chicken pox   . Positive PPD     exposure to 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 Positive PPD only, negative CXR.  [name] History of transfusion of packed red blood cells   . Urinary incontinence     History   Social History  . Marital Status: Widowed    Spouse Name: N/A    Number of Children: N/A  . Years of Education: N/A   Occupational History  . Retired    Social History Main Topics  . Smoking status: Former Smoker    Quit date: 05/15/1967  . Smokeless tobacco: Never Used  . Alcohol Use: No  . Drug Use: No  . Sexually Active: Not on file   Other Topics Concern  . Not on file   Social History Narrative   Regular exercise: yes    Past Surgical History  Procedure Date  . Abdominal hysterectomy 1970  . Appendectomy   . Cholecystectomy   . Total knee arthroplasty 04/2006    total left knee replacement  . Leg surgery 2008    right    Family History  Problem Relation Age of Onset  . Asthma Mother   . Coronary  artery disease    . Hyperlipidemia    . Hypertension    . Arthritis    . Tuberculosis      Allergies  Allergen Reactions  . Atorvastatin     REACTION: MUSCLE PAINS  . Diclofenac-Misoprostol     REACTION: NVD  . Fenofibrate     REACTION: carpopedal spasms  . ZHY:QMVHQIONGEX+BMWUXLKGM+WNUUVOZDGU Acid+Aspartame     REACTION: unspecified  . Penicillins     REACTION: welps  . Rosuvastatin     REACTION: MUSCLE PAINS  . Statins   . Sulfonamide Derivatives   . Tuberculin Ppd     Current Outpatient Prescriptions on File Prior to Visit  Medication Sig Dispense Refill  . amLODipine (NORVASC) 5 MG tablet TAKE 1 TABLET BY MOUTH EVERY DAY  30 tablet  1  . Ascorbic Acid (VITAMIN C) 100 MG tablet Take 100 mg by mouth daily.        [name] aspirin 325 MG tablet Take 325 mg by mouth daily.        . beta carotene 15 MG capsule Take 15 mg by mouth daily.        . bifidobacterium infantis (ALIGN) capsule Take 1  capsule by mouth daily.        . calcitonin, salmon, (MIACALCIN/FORTICAL) 200 UNIT/ACT nasal spray Place 1 spray into the nose daily. Alternate nostrils.  3.7 mL  5  . Coenzyme Q10 200 MG capsule Take 200 mg by mouth daily.        . fluticasone (FLONASE) 50 MCG/ACT nasal spray 2 sprays by Nasal route daily. Each nostril.       [name] ibuprofen (ADVIL,MOTRIN) 200 MG tablet Take 200 mg by mouth. Take 1 tablet as needed for pain.       . Magnesium 500 MG TABS Take 1 tablet by mouth daily.        . Multiple Vitamin (MULTIVITAMIN) tablet Take 1 tablet by mouth daily.        . niacin 500 MG tablet Take 500 mg by mouth. Take 1 tablet by mouth at bedtime x 1 week, then 2 tablets at bedtime second week, then 3 tablets at bedtime starting on third week.       . Omega-3 Fatty Acids (FISH OIL) 1000 MG CAPS Take 2 capsules by mouth 2 (two) times daily.        [name] omeprazole (PRILOSEC) 20 MG capsule TAKE 1 CAPSULE TWICE DAILY  180 capsule  1  . potassium chloride (MICRO-K) 10 MEQ CR capsule TAKE 1 CAPSULE DAILY  90  capsule  1  . quinapril-hydrochlorothiazide (ACCURETIC) 20-25 MG per tablet TAKE 1 TABLET DAILY  90 tablet  1  . vitamin E (VITAMIN E) 400 UNIT capsule Take 400 Units by mouth daily.        [name] ZETIA 10 MG tablet TAKE 1 TABLET DAILY  90 each  1    BP 142/70  Pulse 68  Temp(Src) 97.8 F (36.6 C) (Oral)  Resp 16  Ht 5\' 2"  (1.575 m)  Wt 150 lb (68.04 kg)  BMI 27.44 kg/m2  LMP 05/14/1965       Objective:   Physical Exam  Constitutional: She appears well-developed and well-nourished. No distress.  Cardiovascular: Normal rate and regular rhythm.   No murmur heard. Pulmonary/Chest: Effort normal and breath sounds normal. No respiratory distress. She has no wheezes. She has no rales. She exhibits no tenderness.  Musculoskeletal: She exhibits no edema.  Psychiatric: She has a normal mood and affect. Her behavior is normal. Judgment and thought content normal.          Assessment & Plan:   BP Readings from Last 3 Encounters:  04/18/11 142/70  10/10/10 128/80  08/01/10 136/76

## 2011-04-18 NOTE — Assessment & Plan Note (Signed)
Hx of barretts, clinically stable on PPI, continue same.

## 2011-04-18 NOTE — Assessment & Plan Note (Signed)
>>  ASSESSMENT AND PLAN FOR HYPERCHOLESTEROLEMIA WRITTEN ON 04/18/2011  3:03 PM BY O'SULLIVAN, Ashtyn Freilich, NP  Up a bit since last visit, she will work on diet, continue zetia .

## 2011-04-18 NOTE — Assessment & Plan Note (Signed)
>>  ASSESSMENT AND PLAN FOR ESSENTIAL HYPERTENSION WRITTEN ON 04/18/2011  3:02 PM BY O'SULLIVAN, Savian Mazon, NP  DBP is slightly higher this visit.  Pt will continue current meds, work on diet/exercise, and follow up in 3 months.

## 2011-04-18 NOTE — Assessment & Plan Note (Signed)
DBP is slightly higher this visit.  Pt will continue current meds, work on diet/exercise, and follow up in 3 months.

## 2011-04-18 NOTE — Patient Instructions (Signed)
Please follow up in 3 months. Sooner if problems or concerns.  

## 2011-04-18 NOTE — Assessment & Plan Note (Signed)
Up a bit since last visit, she will work on diet, continue zetia.

## 2011-04-18 NOTE — Assessment & Plan Note (Signed)
A1C is stable at 6, continue diabetic diet.

## 2011-05-10 ENCOUNTER — Encounter: Payer: Self-pay | Admitting: Internal Medicine

## 2011-05-10 ENCOUNTER — Ambulatory Visit (INDEPENDENT_AMBULATORY_CARE_PROVIDER_SITE_OTHER): Payer: Medicare PPO | Admitting: Internal Medicine

## 2011-05-10 VITALS — BP 126/70 | HR 85 | Temp 98.3°F | Resp 18 | Wt 156.0 lb

## 2011-05-10 DIAGNOSIS — N390 Urinary tract infection, site not specified: Secondary | ICD-10-CM | POA: Insufficient documentation

## 2011-05-10 DIAGNOSIS — R300 Dysuria: Secondary | ICD-10-CM

## 2011-05-10 LAB — POCT URINALYSIS DIPSTICK
Bilirubin, UA: NEGATIVE
Ketones, UA: NEGATIVE
Leukocytes, UA: NEGATIVE
Protein, UA: NEGATIVE
Spec Grav, UA: 1.01

## 2011-05-10 MED ORDER — CIPROFLOXACIN HCL 500 MG PO TABS: 500.0000 mg | ORAL_TABLET | Freq: Two times a day (BID) | ORAL | Status: AC

## 2011-05-10 NOTE — Assessment & Plan Note (Signed)
UA unremarkable but sx's c/w uti. Begin cipro x 5 days. Obtain urine cx. Followup if no improvement or worsening.

## 2011-05-10 NOTE — Progress Notes (Signed)
  Subjective:    Patient ID: [name], female    DOB: 1941/02/09, 70 y.o.   MRN: [id number]  HPI Pt presents to clinic for evaluation of urinary sx's. Notes 2d h/o dysuria and urinary frequency without hematuria, fever or chills. Has mild suprapubic discomfort and mild lbp. No other alleviating or exacerbating factors. No other complaints.  Past Medical History  Diagnosis Date  . Hyperlipidemia   . Hypertension   . Osteoporosis     pt cannot tolerate fosamax  . Arthritis     osteoarthritis  . Barrett's esophagus   . GERD (gastroesophageal reflux disease)   . Chronic obstructive bronchitis     Dr Delford Field  . IFG (impaired fasting glucose)   . Colon polyps   . History of nephrolithiasis   . History of chicken pox   . Positive PPD     exposure to 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 Positive PPD only, negative CXR.  [name] History of transfusion of packed red blood cells   . Urinary incontinence    Past Surgical History  Procedure Date  . Abdominal hysterectomy 1970  . Appendectomy   . Cholecystectomy   . Total knee arthroplasty 04/2006    total left knee replacement  . Leg surgery 2008    right    reports that she quit smoking about 44 years ago. She has never used smokeless tobacco. She reports that she does not drink alcohol or use illicit drugs. family history includes Arthritis in an unspecified family member; Asthma in her mother; Coronary artery disease in an unspecified family member; Hyperlipidemia in an unspecified family member; Hypertension in an unspecified family member; and Tuberculosis in an unspecified family member. Allergies  Allergen Reactions  . Atorvastatin     REACTION: MUSCLE PAINS  . Diclofenac-Misoprostol     REACTION: NVD  . Fenofibrate     REACTION: carpopedal spasms  . WUJ:WJXBJYNWGNF+AOZHYQMVH+QIONGEXBMW Acid+Aspartame     REACTION: unspecified  . Penicillins     REACTION: welps  . Rosuvastatin     REACTION: MUSCLE PAINS  . Statins   . Sulfonamide  Derivatives   . Tuberculin Ppd      Review of Systems see hpi     Objective:   Physical Exam  Nursing note and vitals reviewed. Constitutional: She appears well-developed and well-nourished. No distress.  HENT:  Head: Normocephalic and atraumatic.  Eyes: Conjunctivae are normal. No scleral icterus.  Abdominal: Soft.       Slight discomfort to palpation suprapubic area.  Musculoskeletal:       No cva tenderness  Skin: Skin is warm and dry. She is not diaphoretic.  Psychiatric: She has a normal mood and affect.          Assessment & Plan:

## 2011-05-11 LAB — URINE CULTURE
Colony Count: NO GROWTH
Organism ID, Bacteria: NO GROWTH

## 2011-05-26 ENCOUNTER — Other Ambulatory Visit: Payer: Self-pay | Admitting: Family

## 2011-06-19 ENCOUNTER — Telehealth: Payer: Self-pay | Admitting: Family

## 2011-06-19 NOTE — Telephone Encounter (Signed)
Quinapril-hydrochlorothiazide 20-25 mg qty 90  Potassium chloride cr 10 meq  Qty 90  Omeprazole 20 mg 180 qty  levaquin 500 tab qty 90  ezetimibe 10mg  qty 90  [facility] [id number] [place] [address] fax [phone]

## 2011-06-20 NOTE — Telephone Encounter (Signed)
Medications pended. Left message for pt to return my call. Will verify with pt that Levaquin is abx and will not be sent to mail order, did she mean to request something else?

## 2011-06-21 MED ORDER — EZETIMIBE 10 MG PO TABS: 10.0000 mg | ORAL_TABLET | Freq: Every day | ORAL | Status: DC

## 2011-06-21 MED ORDER — OMEPRAZOLE 20 MG PO CPDR: 20.0000 mg | DELAYED_RELEASE_CAPSULE | Freq: Two times a day (BID) | ORAL | Status: DC

## 2011-06-21 MED ORDER — QUINAPRIL-HYDROCHLOROTHIAZIDE 20-25 MG PO TABS: 1.0000 | ORAL_TABLET | Freq: Every day | ORAL | Status: DC

## 2011-06-21 MED ORDER — POTASSIUM CHLORIDE ER 10 MEQ PO CPCR: 10.0000 meq | ORAL_CAPSULE | Freq: Every day | ORAL | Status: DC

## 2011-06-21 MED ORDER — AMLODIPINE BESYLATE 5 MG PO TABS: 5.0000 mg | ORAL_TABLET | Freq: Every day | ORAL | Status: DC

## 2011-06-21 NOTE — Telephone Encounter (Signed)
Pt returned my call and left message on voicemail to call her for clarification. Attempted to reach pt and left detailed message for pt to call me back re: levaquin request and need for abx?

## 2011-06-21 NOTE — Telephone Encounter (Signed)
Pt called and left message that she did not mean to request Levaquin. Needs amlodipine refilled. Refills sent to [facility] for 3 month supply x 1 refill. Pt notified of completion.

## 2011-07-17 ENCOUNTER — Ambulatory Visit: Payer: Medicare PPO | Admitting: Family

## 2011-10-15 ENCOUNTER — Ambulatory Visit (INDEPENDENT_AMBULATORY_CARE_PROVIDER_SITE_OTHER): Payer: Medicare Other | Admitting: Family

## 2011-10-15 ENCOUNTER — Telehealth: Payer: Self-pay | Admitting: Family

## 2011-10-15 ENCOUNTER — Encounter: Payer: Self-pay | Admitting: Family

## 2011-10-15 ENCOUNTER — Ambulatory Visit (HOSPITAL_BASED_OUTPATIENT_CLINIC_OR_DEPARTMENT_OTHER)
Admission: RE | Admit: 2011-10-15 | Discharge: 2011-10-15 | Disposition: A | Discharge status: Home / Self Care | Payer: Medicare Other | Source: Ambulatory Visit | Attending: Family | Admitting: Family

## 2011-10-15 VITALS — BP 116/68 | HR 86 | Temp 97.8°F | Resp 16 | Wt 175.0 lb

## 2011-10-15 DIAGNOSIS — I2510 Atherosclerotic heart disease of native coronary artery without angina pectoris: Secondary | ICD-10-CM | POA: Insufficient documentation

## 2011-10-15 DIAGNOSIS — R109 Unspecified abdominal pain: Secondary | ICD-10-CM

## 2011-10-15 DIAGNOSIS — R3129 Other microscopic hematuria: Secondary | ICD-10-CM | POA: Insufficient documentation

## 2011-10-15 DIAGNOSIS — Z87442 Personal history of urinary calculi: Secondary | ICD-10-CM | POA: Insufficient documentation

## 2011-10-15 DIAGNOSIS — R300 Dysuria: Secondary | ICD-10-CM

## 2011-10-15 DIAGNOSIS — N390 Urinary tract infection, site not specified: Secondary | ICD-10-CM

## 2011-10-15 LAB — POCT URINALYSIS DIPSTICK
Glucose, UA: NEGATIVE
Nitrite, UA: NEGATIVE
Spec Grav, UA: 1.01
Urobilinogen, UA: 0.2

## 2011-10-15 MED ORDER — CIPROFLOXACIN HCL 250 MG PO TABS: 250.0000 mg | ORAL_TABLET | Freq: Two times a day (BID) | ORAL | Status: AC

## 2011-10-15 NOTE — Telephone Encounter (Signed)
Reviewed CT scan- neg for kidney stone.  Reviewed incidental finding of atherosclerosis including CAD.  Recommended that she continue aspirin and work hard on diet, exercise and weight loss.

## 2011-10-15 NOTE — Patient Instructions (Signed)
Please complete your  CT on the first floor.  Follow up in 1 week. Call if fever >101, bright red blood in urine, weakness/nausea/vomiting, or if no improvement in symptoms in 2-3 days.

## 2011-10-15 NOTE — Assessment & Plan Note (Signed)
CT abd/pelvis obtained to exclude kidney stone.  CT is neg for kidney stone.  UA suggests UTI. Will send urine for culture and plan to treat with cipro.

## 2011-10-15 NOTE — Progress Notes (Signed)
Subjective:    Patient ID: [name], female    DOB: [birth year], 71 y.o.   MRN: [id number]  HPI  Ms.  Fabry is a 71 yr old female who presents today with chief complaint of dysuria.  She reports that dysuria started 3 days ago.  She denies associated fever. She does report  Mild associated left sided low back pain,  frequency and incontinence.  She has not tried any otc meds.    Review of Systems See HPI  Past Medical History  Diagnosis Date  . Hyperlipidemia   . Hypertension   . Osteoporosis     pt cannot tolerate fosamax  . Arthritis     osteoarthritis  . Barrett's esophagus   . GERD (gastroesophageal reflux disease)   . Chronic obstructive bronchitis     Dr Delford Field  . IFG (impaired fasting glucose)   . Colon polyps   . History of nephrolithiasis   . History of chicken pox   . Positive PPD     exposure to 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 Positive PPD only, negative CXR.  [name] History of transfusion of packed red blood cells   . Urinary incontinence     History   Social History  . Marital Status: Widowed    Spouse Name: N/A    Number of Children: N/A  . Years of Education: N/A   Occupational History  . Retired    Social History Main Topics  . Smoking status: Former Smoker    Quit date: 05/15/1967  . Smokeless tobacco: Never Used  . Alcohol Use: No  . Drug Use: No  . Sexually Active: Not on file   Other Topics Concern  . Not on file   Social History Narrative   Regular exercise: yes    Past Surgical History  Procedure Date  . Abdominal hysterectomy 1970  . Appendectomy   . Cholecystectomy   . Total knee arthroplasty 04/2006    total left knee replacement  . Leg surgery 2008    right    Family History  Problem Relation Age of Onset  . Asthma Mother   . Coronary artery disease    . Hyperlipidemia    . Hypertension    . Arthritis    . Tuberculosis      Allergies  Allergen Reactions  . Amoxicillin-Pot Clavulanate     REACTION: unspecified  .  Atorvastatin     REACTION: MUSCLE PAINS  . Diclofenac-Misoprostol     REACTION: NVD  . Fenofibrate     REACTION: carpopedal spasms  . Penicillins     REACTION: welps  . Rosuvastatin     REACTION: MUSCLE PAINS  . Statins   . Sulfonamide Derivatives   . Tuberculin Ppd     Current Outpatient Prescriptions on File Prior to Visit  Medication Sig Dispense Refill  . amLODipine (NORVASC) 5 MG tablet Take 1 tablet (5 mg total) by mouth daily.  90 tablet  1  . Ascorbic Acid (VITAMIN C) 100 MG tablet Take 100 mg by mouth daily.        [name] aspirin 325 MG tablet Take 325 mg by mouth daily.        . beta carotene 15 MG capsule Take 15 mg by mouth daily.        . bifidobacterium infantis (ALIGN) capsule Take 1 capsule by mouth daily.        . Coenzyme Q10 200 MG capsule Take 200 mg by mouth daily.        [name]  ezetimibe (ZETIA) 10 MG tablet Take 1 tablet (10 mg total) by mouth daily.  90 tablet  1  . fluticasone (FLONASE) 50 MCG/ACT nasal spray Place 2 sprays into the nose daily as needed. Each nostril.      [name] ibuprofen (ADVIL,MOTRIN) 200 MG tablet Take 200 mg by mouth. Take 1 tablet as needed for pain.       . Magnesium 500 MG TABS Take 1 tablet by mouth daily.        . Multiple Vitamin (MULTIVITAMIN) tablet Take 1 tablet by mouth daily.        . niacin 500 MG tablet Take 500 mg by mouth. Take 1 tablet by mouth at bedtime x 1 week, then 2 tablets at bedtime second week, then 3 tablets at bedtime starting on third week.       . Omega-3 Fatty Acids (FISH OIL) 1000 MG CAPS Take 2 capsules by mouth 2 (two) times daily.        [name] omeprazole (PRILOSEC) 20 MG capsule Take 1 capsule (20 mg total) by mouth 2 (two) times daily.  180 capsule  1  . potassium chloride (MICRO-K) 10 MEQ CR capsule Take 1 capsule (10 mEq total) by mouth daily.  90 capsule  1  . quinapril-hydrochlorothiazide (ACCURETIC) 20-25 MG per tablet Take 1 tablet by mouth daily.  90 tablet  1  . vitamin E (VITAMIN E) 400 UNIT capsule Take 400  Units by mouth daily.        . calcitonin, salmon, (MIACALCIN/FORTICAL) 200 UNIT/ACT nasal spray Place 1 spray into the nose daily. Alternate nostrils.  3.7 mL  5    BP 116/68  Pulse 86  Temp(Src) 97.8 F (36.6 C) (Oral)  Resp 16  Wt 175 lb 0.6 oz (79.398 kg)  SpO2 95%  LMP 05/14/1965       Objective:   Physical Exam  Constitutional: She appears well-developed and well-nourished. No distress.  Cardiovascular: Normal rate and regular rhythm.   No murmur heard. Pulmonary/Chest: Effort normal and breath sounds normal. No respiratory distress. She has no wheezes. She has no rales. She exhibits no tenderness.  Abdominal: Soft.       RLQ and LLQ tenderness to palpation without guarding  Genitourinary:       L CVAT is noted.   Musculoskeletal: She exhibits no edema.  Psychiatric: She has a normal mood and affect. Her behavior is normal. Judgment and thought content normal.          Assessment & Plan:

## 2011-10-17 LAB — URINE CULTURE: Colony Count: >=100000

## 2011-10-18 ENCOUNTER — Encounter: Payer: Self-pay | Admitting: Family

## 2011-10-22 ENCOUNTER — Ambulatory Visit: Payer: Medicare Other | Admitting: Family

## 2011-12-07 ENCOUNTER — Other Ambulatory Visit: Payer: Self-pay | Admitting: *Deleted

## 2011-12-07 MED ORDER — AMLODIPINE BESYLATE 5 MG PO TABS: 5.0000 mg | ORAL_TABLET | Freq: Every day | ORAL | Status: DC

## 2011-12-07 NOTE — Telephone Encounter (Signed)
Received message from pt requesting refill of amlodipine be sent to [facility] mail order. Refill sent to [facility] #90 x no refills. Pt is due for follow up of BP and cholesterol. Left detailed message on pt's home # re: Rx completion and to call and schedule a fasting follow up in the next 2 months.

## 2012-01-03 ENCOUNTER — Other Ambulatory Visit: Payer: Self-pay | Admitting: *Deleted

## 2012-01-03 MED ORDER — QUINAPRIL-HYDROCHLOROTHIAZIDE 20-25 MG PO TABS: 1.0000 | ORAL_TABLET | Freq: Every day | ORAL | Status: DC

## 2012-01-03 NOTE — Telephone Encounter (Signed)
Request Accuretic to [facility]; done/SLS

## 2012-02-08 ENCOUNTER — Encounter: Payer: Self-pay | Admitting: Family

## 2012-02-08 ENCOUNTER — Ambulatory Visit (INDEPENDENT_AMBULATORY_CARE_PROVIDER_SITE_OTHER): Payer: Medicare Other | Admitting: Family

## 2012-02-08 VITALS — BP 116/78 | HR 75 | Temp 98.2°F | Resp 14 | Wt 174.8 lb

## 2012-02-08 DIAGNOSIS — R7301 Impaired fasting glucose: Secondary | ICD-10-CM

## 2012-02-08 DIAGNOSIS — K2270 Barrett's esophagus without dysplasia: Secondary | ICD-10-CM

## 2012-02-08 DIAGNOSIS — E7800 Pure hypercholesterolemia, unspecified: Secondary | ICD-10-CM

## 2012-02-08 DIAGNOSIS — Z23 Encounter for immunization: Secondary | ICD-10-CM | POA: Insufficient documentation

## 2012-02-08 DIAGNOSIS — E785 Hyperlipidemia, unspecified: Secondary | ICD-10-CM

## 2012-02-08 DIAGNOSIS — M810 Age-related osteoporosis without current pathological fracture: Secondary | ICD-10-CM

## 2012-02-08 DIAGNOSIS — I10 Essential (primary) hypertension: Secondary | ICD-10-CM

## 2012-02-08 DIAGNOSIS — R739 Hyperglycemia, unspecified: Secondary | ICD-10-CM

## 2012-02-08 DIAGNOSIS — R7309 Other abnormal glucose: Secondary | ICD-10-CM

## 2012-02-08 LAB — BASIC METABOLIC PANEL WITH GFR
Calcium: 10 mg/dL (ref 8.4–10.5)
GFR, Est African American: >89 mL/min
Sodium: 139 mEq/L (ref 135–145)

## 2012-02-08 LAB — LIPID PANEL
Cholesterol: 265 mg/dL — ABNORMAL HIGH (ref 0–200)
HDL: 61 mg/dL (ref >39)
Total CHOL/HDL Ratio: 4.3 Ratio
Triglycerides: 221 mg/dL — ABNORMAL HIGH (ref <150)
VLDL: 44 mg/dL — ABNORMAL HIGH (ref 0–40)

## 2012-02-08 LAB — HEPATIC FUNCTION PANEL
Bilirubin, Direct: 0.1 mg/dL (ref 0.0–0.3)
Total Bilirubin: 0.4 mg/dL (ref 0.3–1.2)

## 2012-02-08 MED ORDER — AMLODIPINE BESYLATE 5 MG PO TABS: 5.0000 mg | ORAL_TABLET | Freq: Every day | ORAL | Status: DC

## 2012-02-08 MED ORDER — EZETIMIBE 10 MG PO TABS: 10.0000 mg | ORAL_TABLET | Freq: Every day | ORAL | Status: DC

## 2012-02-08 NOTE — Patient Instructions (Addendum)
Please complete lab work prior to leaving.   Please schedule a follow up appointment in 3 months.  

## 2012-02-08 NOTE — Assessment & Plan Note (Signed)
Obtain A1C, flu shot today.  

## 2012-02-08 NOTE — Assessment & Plan Note (Signed)
>>  ASSESSMENT AND PLAN FOR HYPERCHOLESTEROLEMIA WRITTEN ON 02/08/2012  8:32 AM BY O'SULLIVAN, Chasyn Cinque, NP  Obtain FLP, lft.  Discussed improving diet and increasing exercise/weight loss.

## 2012-02-08 NOTE — Assessment & Plan Note (Signed)
>>  ASSESSMENT AND PLAN FOR ESSENTIAL HYPERTENSION WRITTEN ON 02/08/2012  8:31 AM BY O'SULLIVAN, Kniyah Khun, NP  BP looks good on amlodipine and accuretic.  Obtain bmet, continue same.

## 2012-02-08 NOTE — Progress Notes (Signed)
Subjective:    Patient ID: [name], female    DOB: 31-Jul-1940, 71 y.o.   MRN: [id number]  HPI  Ms.  [name] is a 71 yr old female who presents today for follow up.  1) Hyperlipidemia-On zetia.  Diet has been poor- lots of fast food.   2) HTN- on amlodipine.  Denies CP or SOB or edema.  3) GERD-continues omeprazole. Sometimes take 2 tabs a day due to symptoms.  Review of Systems See HPI  Past Medical History  Diagnosis Date  . Hyperlipidemia   . Hypertension   . Osteoporosis     pt cannot tolerate fosamax  . Arthritis     osteoarthritis  . Barrett's esophagus   . GERD (gastroesophageal reflux disease)   . Chronic obstructive bronchitis     Dr Delford Field  . IFG (impaired fasting glucose)   . Colon polyps   . History of nephrolithiasis   . History of chicken pox   . Positive PPD     exposure to 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 Positive PPD only, negative CXR.  [name] History of transfusion of packed red blood cells   . Urinary incontinence     History   Social History  . Marital Status: Widowed    Spouse Name: N/A    Number of Children: N/A  . Years of Education: N/A   Occupational History  . Retired    Social History Main Topics  . Smoking status: Former Smoker    Quit date: 05/15/1967  . Smokeless tobacco: Never Used  . Alcohol Use: No  . Drug Use: No  . Sexually Active: Not on file   Other Topics Concern  . Not on file   Social History Narrative   Regular exercise: yes    Past Surgical History  Procedure Date  . Abdominal hysterectomy 1970  . Appendectomy   . Cholecystectomy   . Total knee arthroplasty 04/2006    total left knee replacement  . Leg surgery 2008    right    Family History  Problem Relation Age of Onset  . Asthma Mother   . Coronary artery disease    . Hyperlipidemia    . Hypertension    . Arthritis    . Tuberculosis      Allergies  Allergen Reactions  . Amoxicillin-Pot Clavulanate     REACTION: unspecified  . Atorvastatin       REACTION: MUSCLE PAINS  . Diclofenac-Misoprostol     REACTION: NVD  . Fenofibrate     REACTION: carpopedal spasms  . Penicillins     REACTION: welps  . Rosuvastatin     REACTION: MUSCLE PAINS  . Statins   . Sulfonamide Derivatives   . Tuberculin Ppd     Current Outpatient Prescriptions on File Prior to Visit  Medication Sig Dispense Refill  . Ascorbic Acid (VITAMIN C) 100 MG tablet Take 100 mg by mouth daily.        [name] aspirin 325 MG tablet Take 325 mg by mouth daily.        . beta carotene 15 MG capsule Take 15 mg by mouth daily.        . bifidobacterium infantis (ALIGN) capsule Take 1 capsule by mouth daily.        . calcitonin, salmon, (MIACALCIN/FORTICAL) 200 UNIT/ACT nasal spray Place 1 spray into the nose daily. Alternate nostrils.  3.7 mL  5  . Coenzyme Q10 200 MG capsule Take 200 mg by mouth daily.        [name]  fluticasone (FLONASE) 50 MCG/ACT nasal spray Place 2 sprays into the nose daily as needed. Each nostril.      [name] ibuprofen (ADVIL,MOTRIN) 200 MG tablet Take 200 mg by mouth. Take 1 tablet as needed for pain.       . Magnesium 500 MG TABS Take 1 tablet by mouth daily.        . Multiple Vitamin (MULTIVITAMIN) tablet Take 1 tablet by mouth daily.        . niacin 500 MG tablet Take 500 mg by mouth. Take 1 tablet by mouth at bedtime x 1 week, then 2 tablets at bedtime second week, then 3 tablets at bedtime starting on third week.       . Omega-3 Fatty Acids (FISH OIL) 1000 MG CAPS Take 2 capsules by mouth 2 (two) times daily.        [name] omeprazole (PRILOSEC) 20 MG capsule Take 1 capsule (20 mg total) by mouth 2 (two) times daily.  180 capsule  1  . potassium chloride (MICRO-K) 10 MEQ CR capsule Take 1 capsule (10 mEq total) by mouth daily.  90 capsule  1  . quinapril-hydrochlorothiazide (ACCURETIC) 20-25 MG per tablet Take 1 tablet by mouth daily.  90 tablet  1  . vitamin E (VITAMIN E) 400 UNIT capsule Take 400 Units by mouth daily.        [name] DISCONTD: amLODipine (NORVASC) 5 MG  tablet Take 1 tablet (5 mg total) by mouth daily.  90 tablet  0  . DISCONTD: ezetimibe (ZETIA) 10 MG tablet Take 1 tablet (10 mg total) by mouth daily.  90 tablet  1    BP 116/78  Pulse 75  Temp 98.2 F (36.8 C) (Oral)  Resp 14  Wt 174 lb 12 oz (79.266 kg)  SpO2 97%  LMP 05/14/1965       Objective:   Physical Exam  Constitutional: She is oriented to person, place, and time. She appears well-developed and well-nourished. No distress.  HENT:  Head: Normocephalic and atraumatic.  Cardiovascular: Normal rate and regular rhythm.   No murmur heard. Pulmonary/Chest: Effort normal and breath sounds normal. No respiratory distress. She has no wheezes. She has no rales. She exhibits no tenderness.  Musculoskeletal: She exhibits no edema.  Neurological: She is alert and oriented to person, place, and time.  Skin: Skin is warm and dry.  Psychiatric: She has a normal mood and affect. Her behavior is normal. Judgment and thought content normal.          Assessment & Plan:

## 2012-02-08 NOTE — Assessment & Plan Note (Signed)
Due for Dexa- will order.

## 2012-02-08 NOTE — Assessment & Plan Note (Signed)
BP looks good on amlodipine and accuretic.  Obtain bmet, continue same.

## 2012-02-08 NOTE — Assessment & Plan Note (Signed)
Obtain FLP, lft.  Discussed improving diet and increasing exercise/weight loss.

## 2012-02-08 NOTE — Assessment & Plan Note (Signed)
Stable on omeprazole. 

## 2012-02-11 ENCOUNTER — Ambulatory Visit (INDEPENDENT_AMBULATORY_CARE_PROVIDER_SITE_OTHER)
Admission: RE | Admit: 2012-02-11 | Discharge: 2012-02-11 | Disposition: A | Discharge status: Home / Self Care | Payer: Medicare Other | Source: Ambulatory Visit

## 2012-02-11 DIAGNOSIS — M810 Age-related osteoporosis without current pathological fracture: Secondary | ICD-10-CM

## 2012-02-12 ENCOUNTER — Telehealth: Payer: Self-pay | Admitting: Family

## 2012-02-12 NOTE — Telephone Encounter (Signed)
Left detailed message on home# and to call if any questions. 

## 2012-02-12 NOTE — Telephone Encounter (Signed)
Pls call pt and let her know that her cholesterol is significantly higher than it was 1 year ago.  Was 203, now 265.  Please continue zetia, niacin, fish oil and work hard on diet, exercise and weight loss. Sugar is well controlled with A1C 5.9.

## 2012-03-05 ENCOUNTER — Telehealth: Payer: Self-pay | Admitting: *Deleted

## 2012-03-05 NOTE — Telephone Encounter (Signed)
Pt called requesting rx refill for amlodipine 5mg  #90 to her mail order [facility]. Per chart on 02/08/12 rx sent to [facility] for amlodipine 5mg  #90 x 1rf. Called pt and left a detailed message that the rx was sent on 02/08/12. Asked pt to call [facility] and speak to a representative not the automated system and ask them to check and see if there is a rx on file dated 02/08/12. If they do not, please call us back.

## 2012-03-18 ENCOUNTER — Encounter: Payer: Self-pay | Admitting: Family

## 2012-03-18 ENCOUNTER — Telehealth: Payer: Self-pay | Admitting: Family

## 2012-03-18 NOTE — Telephone Encounter (Signed)
Pls call pt and let her know that I have reviewed  bone density test.  It shows osteopenia (bone thinning) but appears stable compared to bone density performed 07/18/09.  She should continue the calcitonin nasal spray (ok to send refills if necessary) and  She should add caltrate 600mg  + D bid.  Regular weight bearing exercise such as walking is also important for her bones.

## 2012-03-19 MED ORDER — CALCITONIN (SALMON) 200 UNIT/ACT NA SOLN: 1.0000 | Freq: Every day | NASAL | Status: DC

## 2012-03-19 NOTE — Telephone Encounter (Signed)
Notified pt and she requests refill of calcitonin. Reports that she currently takes caltrate d bid and is doing water aerobics as she had to stop walking due to her arthritis. Refill sent to [facility] per pt's request.

## 2012-03-19 NOTE — Telephone Encounter (Signed)
Left message on home # to return my call. 

## 2012-05-05 ENCOUNTER — Encounter: Payer: Self-pay | Admitting: Family

## 2012-05-05 ENCOUNTER — Ambulatory Visit (INDEPENDENT_AMBULATORY_CARE_PROVIDER_SITE_OTHER): Payer: Medicare Other | Admitting: Family

## 2012-05-05 VITALS — BP 142/74 | HR 77 | Temp 98.2°F | Resp 16 | Ht 62.0 in | Wt 174.0 lb

## 2012-05-05 DIAGNOSIS — E7800 Pure hypercholesterolemia, unspecified: Secondary | ICD-10-CM

## 2012-05-05 DIAGNOSIS — R7301 Impaired fasting glucose: Secondary | ICD-10-CM

## 2012-05-05 DIAGNOSIS — M810 Age-related osteoporosis without current pathological fracture: Secondary | ICD-10-CM

## 2012-05-05 DIAGNOSIS — K219 Gastro-esophageal reflux disease without esophagitis: Secondary | ICD-10-CM

## 2012-05-05 NOTE — Assessment & Plan Note (Signed)
Clinically stable on OTC prilosec.

## 2012-05-05 NOTE — Assessment & Plan Note (Signed)
Will plan to repeat flp next visit.  Continue Zetia and fish oil.

## 2012-05-05 NOTE — Patient Instructions (Addendum)
Please follow up in 3 months.  Merry Christmas!

## 2012-05-05 NOTE — Progress Notes (Signed)
Subjective:    Patient ID: [name], female    DOB: 1941/01/18, 71 y.o.   MRN: [id number]  HPI  [name] is a 71 yr old female who presents today for follow up.  1) HTN- she continues amlodipine and accuretic.  Denies CP, Swelling, SOB  2) GERD/Barrett's esophagus- she continues prilosec.  Reports that this is well controlled.   3) Osteoporosis- she continues calcitonin, caltrate and water aerobics.    4) Hyperglycemia- Denies urinary frequency or polydipsia. A1C was stable last visit at 5.9.     Review of Systems    see HPI  Past Medical History  Diagnosis Date  . Hyperlipidemia   . Hypertension   . Osteoporosis     pt cannot tolerate fosamax  . Arthritis     osteoarthritis  . Barrett's esophagus   . GERD (gastroesophageal reflux disease)   . Chronic obstructive bronchitis     Dr Delford Field  . IFG (impaired fasting glucose)   . Colon polyps   . History of nephrolithiasis   . History of chicken pox   . Positive PPD     exposure to 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 Positive PPD only, negative CXR.  [name] History of transfusion of packed red blood cells   . Urinary incontinence     History   Social History  . Marital Status: Widowed    Spouse Name: N/A    Number of Children: N/A  . Years of Education: N/A   Occupational History  . Retired    Social History Main Topics  . Smoking status: Former Smoker    Quit date: 05/15/1967  . Smokeless tobacco: Never Used  . Alcohol Use: No  . Drug Use: No  . Sexually Active: Not on file   Other Topics Concern  . Not on file   Social History Narrative   Regular exercise: yes    Past Surgical History  Procedure Date  . Abdominal hysterectomy 1970  . Appendectomy   . Cholecystectomy   . Total knee arthroplasty 04/2006    total left knee replacement  . Leg surgery 2008    right    Family History  Problem Relation Age of Onset  . Asthma Mother   . Coronary artery disease    . Hyperlipidemia    . Hypertension     . Arthritis    . Tuberculosis      Allergies  Allergen Reactions  . Amoxicillin-Pot Clavulanate     REACTION: unspecified  . Atorvastatin     REACTION: MUSCLE PAINS  . Diclofenac-Misoprostol     REACTION: NVD  . Fenofibrate     REACTION: carpopedal spasms  . Penicillins     REACTION: welps  . Rosuvastatin     REACTION: MUSCLE PAINS  . Statins   . Sulfonamide Derivatives   . Tuberculin Ppd     Current Outpatient Prescriptions on File Prior to Visit  Medication Sig Dispense Refill  . amLODipine (NORVASC) 5 MG tablet Take 1 tablet (5 mg total) by mouth daily.  90 tablet  1  . Ascorbic Acid (VITAMIN C) 100 MG tablet Take 100 mg by mouth daily.        [name] aspirin 325 MG tablet Take 325 mg by mouth daily.        . beta carotene 15 MG capsule Take 15 mg by mouth daily.        . bifidobacterium infantis (ALIGN) capsule Take 1 capsule by mouth daily.        [name]  calcitonin, salmon, (MIACALCIN/FORTICAL) 200 UNIT/ACT nasal spray Place 1 spray into the nose daily. Alternate nostrils.  3.7 mL  5  . Calcium Carbonate-Vitamin D (CALTRATE 600+D) 600-400 MG-UNIT per tablet Take 1 tablet by mouth 2 (two) times daily.      . Coenzyme Q10 200 MG capsule Take 200 mg by mouth daily.        [name] ezetimibe (ZETIA) 10 MG tablet Take 1 tablet (10 mg total) by mouth daily.  90 tablet  1  . fluticasone (FLONASE) 50 MCG/ACT nasal spray Place 2 sprays into the nose daily as needed. Each nostril.      [name] ibuprofen (ADVIL,MOTRIN) 200 MG tablet Take 200 mg by mouth. Take 1 tablet as needed for pain.       . Magnesium 500 MG TABS Take 1 tablet by mouth daily.        . Multiple Vitamin (MULTIVITAMIN) tablet Take 1 tablet by mouth daily.        . niacin 500 MG tablet Take 500 mg by mouth. Take 1 tablet by mouth at bedtime x 1 week, then 2 tablets at bedtime second week, then 3 tablets at bedtime starting on third week.       . Omega-3 Fatty Acids (FISH OIL) 1000 MG CAPS Take 2 capsules by mouth 2 (two) times daily.         [name] omeprazole (PRILOSEC) 20 MG capsule Take 1 capsule (20 mg total) by mouth 2 (two) times daily.  180 capsule  1  . potassium chloride (MICRO-K) 10 MEQ CR capsule Take 1 capsule (10 mEq total) by mouth daily.  90 capsule  1  . quinapril-hydrochlorothiazide (ACCURETIC) 20-25 MG per tablet Take 1 tablet by mouth daily.  90 tablet  1  . vitamin E (VITAMIN E) 400 UNIT capsule Take 400 Units by mouth daily.          BP 142/74  Pulse 77  Temp 98.2 F (36.8 C) (Oral)  Resp 16  Ht 5\' 2"  (1.575 m)  Wt 174 lb 0.6 oz (78.944 kg)  BMI 31.83 kg/m2  SpO2 96%  LMP 05/14/1968    Objective:   Physical Exam  Constitutional: She is oriented to person, place, and time. She appears well-developed and well-nourished. No distress.  HENT:  Head: Normocephalic and atraumatic.  Cardiovascular: Normal rate and regular rhythm.   No murmur heard. Pulmonary/Chest: Effort normal and breath sounds normal. No respiratory distress. She has no wheezes. She has no rales. She exhibits no tenderness.  Musculoskeletal: She exhibits no edema.  Neurological: She is alert and oriented to person, place, and time.  Skin: Skin is warm and dry.  Psychiatric: She has a normal mood and affect. Her behavior is normal. Judgment and thought content normal.          Assessment & Plan:

## 2012-05-05 NOTE — Assessment & Plan Note (Signed)
>>  ASSESSMENT AND PLAN FOR HYPERCHOLESTEROLEMIA WRITTEN ON 05/05/2012  8:21 AM BY O'SULLIVAN, Abbegayle Denault, NP  Will plan to repeat flp next visit.  Continue Zetia  and fish oil.

## 2012-05-05 NOTE — Assessment & Plan Note (Signed)
Clinically stable, check FLP next visit.

## 2012-05-05 NOTE — Assessment & Plan Note (Signed)
Continue calcitonin, caltrate, water aerobics.

## 2012-06-04 ENCOUNTER — Telehealth: Payer: Self-pay | Admitting: Family

## 2012-06-04 NOTE — Telephone Encounter (Signed)
Patient left message on nurse voicemail stating that she would like refills of potassium chloride, omeprazole, and amlodipine to be sent to [facility]. She would like a 90 days supply of each medication

## 2012-06-06 MED ORDER — OMEPRAZOLE 20 MG PO CPDR: 20.0000 mg | DELAYED_RELEASE_CAPSULE | Freq: Two times a day (BID) | ORAL | Status: DC

## 2012-06-06 MED ORDER — POTASSIUM CHLORIDE ER 10 MEQ PO CPCR: 10.0000 meq | ORAL_CAPSULE | Freq: Every day | ORAL | Status: DC

## 2012-06-06 MED ORDER — AMLODIPINE BESYLATE 5 MG PO TABS: 5.0000 mg | ORAL_TABLET | Freq: Every day | ORAL | Status: DC

## 2012-06-06 NOTE — Telephone Encounter (Signed)
Notified pt rx sent to [facility].../lmb 

## 2012-07-14 ENCOUNTER — Telehealth: Payer: Self-pay | Admitting: *Deleted

## 2012-07-14 MED ORDER — QUINAPRIL-HYDROCHLOROTHIAZIDE 20-25 MG PO TABS: 1.0000 | ORAL_TABLET | Freq: Every day | ORAL | Status: DC

## 2012-07-14 NOTE — Telephone Encounter (Signed)
Informed patient of medication refill. She states that she will have to call back to schedule an appointment.

## 2012-07-14 NOTE — Telephone Encounter (Signed)
Received message from pt requesting refill of Quinapril HCT be sent to [facility]. Rx sent. Pt is due for fasting follow up of her blood pressure and cholesterol. Please call pt to arrange appt.

## 2012-08-27 ENCOUNTER — Ambulatory Visit (INDEPENDENT_AMBULATORY_CARE_PROVIDER_SITE_OTHER): Payer: Medicare Other | Admitting: Family

## 2012-08-27 ENCOUNTER — Encounter: Payer: Self-pay | Admitting: Family

## 2012-08-27 VITALS — BP 114/80 | HR 74 | Temp 97.6°F | Resp 16 | Ht 62.0 in | Wt 173.0 lb

## 2012-08-27 DIAGNOSIS — M899 Disorder of bone, unspecified: Secondary | ICD-10-CM

## 2012-08-27 DIAGNOSIS — E7800 Pure hypercholesterolemia, unspecified: Secondary | ICD-10-CM

## 2012-08-27 DIAGNOSIS — I10 Essential (primary) hypertension: Secondary | ICD-10-CM

## 2012-08-27 DIAGNOSIS — R7309 Other abnormal glucose: Secondary | ICD-10-CM

## 2012-08-27 DIAGNOSIS — M8580 Other specified disorders of bone density and structure, unspecified site: Secondary | ICD-10-CM

## 2012-08-27 DIAGNOSIS — M810 Age-related osteoporosis without current pathological fracture: Secondary | ICD-10-CM

## 2012-08-27 DIAGNOSIS — R7301 Impaired fasting glucose: Secondary | ICD-10-CM

## 2012-08-27 DIAGNOSIS — E785 Hyperlipidemia, unspecified: Secondary | ICD-10-CM

## 2012-08-27 DIAGNOSIS — R739 Hyperglycemia, unspecified: Secondary | ICD-10-CM

## 2012-08-27 LAB — HEMOGLOBIN A1C
Hgb A1c MFr Bld: 5.7 % — ABNORMAL HIGH (ref <5.7)
Mean Plasma Glucose: 117 mg/dL — ABNORMAL HIGH (ref <117)

## 2012-08-27 LAB — BASIC METABOLIC PANEL WITH GFR
BUN: 15 mg/dL (ref 6–23)
GFR, Est African American: >89 mL/min
GFR, Est Non African American: >89 mL/min
Potassium: 4.7 mEq/L (ref 3.5–5.3)
Sodium: 134 mEq/L — ABNORMAL LOW (ref 135–145)

## 2012-08-27 LAB — LIPID PANEL: Cholesterol: 293 mg/dL — ABNORMAL HIGH (ref 0–200)

## 2012-08-27 MED ORDER — POTASSIUM CHLORIDE ER 10 MEQ PO CPCR: 10.0000 meq | ORAL_CAPSULE | Freq: Every day | ORAL | Status: DC

## 2012-08-27 MED ORDER — AMLODIPINE BESYLATE 5 MG PO TABS: 5.0000 mg | ORAL_TABLET | Freq: Every day | ORAL | Status: DC

## 2012-08-27 MED ORDER — OMEPRAZOLE 20 MG PO CPDR: 20.0000 mg | DELAYED_RELEASE_CAPSULE | Freq: Two times a day (BID) | ORAL | Status: DC

## 2012-08-27 MED ORDER — FLUTICASONE PROPIONATE 50 MCG/ACT NA SUSP: 2.0000 | Freq: Every day | NASAL | Status: DC | PRN

## 2012-08-27 MED ORDER — QUINAPRIL-HYDROCHLOROTHIAZIDE 20-25 MG PO TABS: 1.0000 | ORAL_TABLET | Freq: Every day | ORAL | Status: DC

## 2012-08-27 MED ORDER — NIACIN 500 MG PO TABS: ORAL_TABLET | ORAL | Status: DC

## 2012-08-27 NOTE — Assessment & Plan Note (Signed)
>>  ASSESSMENT AND PLAN FOR HYPERCHOLESTEROLEMIA WRITTEN ON 08/27/2012  8:35 AM BY O'SULLIVAN, Jeffery Gammell, NP  Check FLP.

## 2012-08-27 NOTE — Assessment & Plan Note (Signed)
Check A1C 

## 2012-08-27 NOTE — Progress Notes (Signed)
Subjective:    Patient ID: [name], female    DOB: 10-02-1940, 72 y.o.   MRN: [id number]  HPI  Hypertension Hyperlipidemia Pt stopped zetia x 2 months as she didn't feel it was working well. impaired fasting glucose Osteoporosis Pt stopped calcitonin spray due to cost. She is on accuretic and denies ace cough.   Osteopenia- stopped calcitonin.  Has taken fosamax and believes it caused her femur fracture.  Hyperlipidemia-  Stopped zetia.  Reports that it was too expensive.   Review of Systems See HPI  Past Medical History  Diagnosis Date  . Hyperlipidemia   . Hypertension   . Osteoporosis     pt cannot tolerate fosamax  . Arthritis     osteoarthritis  . Barrett's esophagus   . GERD (gastroesophageal reflux disease)   . Chronic obstructive bronchitis     Dr Delford Field  . IFG (impaired fasting glucose)   . Colon polyps   . History of nephrolithiasis   . History of chicken pox   . Positive PPD     exposure to 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 Positive PPD only, negative CXR.  [name] History of transfusion of packed red blood cells   . Urinary incontinence     History   Social History  . Marital Status: Widowed    Spouse Name: N/A    Number of Children: N/A  . Years of Education: N/A   Occupational History  . Retired    Social History Main Topics  . Smoking status: Former Smoker    Quit date: 05/15/1967  . Smokeless tobacco: Never Used  . Alcohol Use: No  . Drug Use: No  . Sexually Active: Not on file   Other Topics Concern  . Not on file   Social History Narrative   Regular exercise: yes    Past Surgical History  Procedure Laterality Date  . Abdominal hysterectomy  1970  . Appendectomy    . Cholecystectomy    . Total knee arthroplasty  04/2006    total left knee replacement  . Leg surgery  2008    right    Family History  Problem Relation Age of Onset  . Asthma Mother   . Coronary artery disease    . Hyperlipidemia    . Hypertension    . Arthritis     . Tuberculosis      Allergies  Allergen Reactions  . Amoxicillin-Pot Clavulanate     REACTION: unspecified  . Atorvastatin     REACTION: MUSCLE PAINS  . Diclofenac-Misoprostol     REACTION: NVD  . Fenofibrate     REACTION: carpopedal spasms  . Penicillins     REACTION: welps  . Rosuvastatin     REACTION: MUSCLE PAINS  . Statins   . Sulfonamide Derivatives   . Tuberculin Ppd     Current Outpatient Prescriptions on File Prior to Visit  Medication Sig Dispense Refill  . amLODipine (NORVASC) 5 MG tablet Take 1 tablet (5 mg total) by mouth daily.  90 tablet  1  . Ascorbic Acid (VITAMIN C) 100 MG tablet Take 100 mg by mouth daily.        [name] aspirin 325 MG tablet Take 325 mg by mouth daily.        . beta carotene 15 MG capsule Take 15 mg by mouth daily.        . bifidobacterium infantis (ALIGN) capsule Take 1 capsule by mouth daily.        . Calcium  Carbonate-Vitamin D (CALTRATE 600+D) 600-400 MG-UNIT per tablet Take 1 tablet by mouth 2 (two) times daily.      . Coenzyme Q10 200 MG capsule Take 200 mg by mouth daily.        . fluticasone (FLONASE) 50 MCG/ACT nasal spray Place 2 sprays into the nose daily as needed. Each nostril.      [name] ibuprofen (ADVIL,MOTRIN) 200 MG tablet Take 200 mg by mouth. Take 1 tablet as needed for pain.       . Magnesium 500 MG TABS Take 1 tablet by mouth daily.        . Multiple Vitamin (MULTIVITAMIN) tablet Take 1 tablet by mouth daily.        . niacin 500 MG tablet Take 500 mg by mouth. Take 1 tablet by mouth at bedtime x 1 week, then 2 tablets at bedtime second week, then 3 tablets at bedtime starting on third week.       . Omega-3 Fatty Acids (FISH OIL) 1000 MG CAPS Take 2 capsules by mouth 2 (two) times daily.        [name] omeprazole (PRILOSEC) 20 MG capsule Take 1 capsule (20 mg total) by mouth 2 (two) times daily.  180 capsule  1  . potassium chloride (MICRO-K) 10 MEQ CR capsule Take 1 capsule (10 mEq total) by mouth daily.  90 capsule  1  .  quinapril-hydrochlorothiazide (ACCURETIC) 20-25 MG per tablet Take 1 tablet by mouth daily.  90 tablet  1  . vitamin E (VITAMIN E) 400 UNIT capsule Take 400 Units by mouth daily.        . calcitonin, salmon, (MIACALCIN/FORTICAL) 200 UNIT/ACT nasal spray Place 1 spray into the nose daily. Alternate nostrils.  3.7 mL  5  . ezetimibe (ZETIA) 10 MG tablet Take 1 tablet (10 mg total) by mouth daily.  90 tablet  1   No current facility-administered medications on file prior to visit.    BP 114/80  Pulse 74  Temp(Src) 97.6 F (36.4 C) (Oral)  Resp 16  Ht 5\' 2"  (1.575 m)  Wt 173 lb (78.472 kg)  BMI 31.63 kg/m2  SpO2 97%  LMP 05/14/1968       Objective:   Physical Exam  Constitutional: She is oriented to person, place, and time. She appears well-developed and well-nourished. No distress.  HENT:  Head: Normocephalic and atraumatic.  Cardiovascular: Normal rate and regular rhythm.   No murmur heard. Pulmonary/Chest: Effort normal and breath sounds normal. No respiratory distress. She has no wheezes. She has no rales. She exhibits no tenderness.  Lymphadenopathy:    She has no cervical adenopathy.  Neurological: She is alert and oriented to person, place, and time.  Psychiatric: She has a normal mood and affect. Her behavior is normal. Judgment and thought content normal.          Assessment & Plan:

## 2012-08-27 NOTE — Assessment & Plan Note (Signed)
>>  ASSESSMENT AND PLAN FOR ESSENTIAL HYPERTENSION WRITTEN ON 08/27/2012  8:34 AM BY O'SULLIVAN, Indra Wolters, NP  BP stable on amlodipine and accuretic.  Continue same, check bmet.

## 2012-08-27 NOTE — Patient Instructions (Addendum)
Please follow up in 3 months for medicare wellness visit.

## 2012-08-27 NOTE — Assessment & Plan Note (Signed)
Check FLP 

## 2012-08-27 NOTE — Assessment & Plan Note (Signed)
BP stable on amlodipine and accuretic.  Continue same, check bmet.

## 2012-08-27 NOTE — Assessment & Plan Note (Signed)
Check vitamin D level.  Continue caltrate.

## 2012-08-28 LAB — VITAMIN D 25 HYDROXY (VIT D DEFICIENCY, FRACTURES): Vit D, 25-Hydroxy: 48 ng/mL (ref 30–89)

## 2012-08-31 ENCOUNTER — Telehealth: Payer: Self-pay | Admitting: Family

## 2012-08-31 NOTE — Telephone Encounter (Signed)
Sugar is well controlled but cholesterol is much higher. It has gone up since she stopped Zetia.  I would recommend that she resume zetia.  If she is agreeable, I will send refills.

## 2012-09-02 NOTE — Telephone Encounter (Signed)
Left detailed message on home # and to call and let us know if she wants to resume Zetia.

## 2012-09-03 MED ORDER — EZETIMIBE 10 MG PO TABS: 10.0000 mg | ORAL_TABLET | Freq: Every day | ORAL | Status: DC

## 2012-09-03 NOTE — Telephone Encounter (Signed)
Pt left message stating she is agreeable to restart zetia.

## 2012-09-03 NOTE — Telephone Encounter (Signed)
90 day supply sent to [facility].

## 2012-09-03 NOTE — Telephone Encounter (Signed)
Patient informed/SLS  

## 2012-11-24 ENCOUNTER — Ambulatory Visit: Payer: Medicare Other | Admitting: Family

## 2012-12-10 ENCOUNTER — Ambulatory Visit (INDEPENDENT_AMBULATORY_CARE_PROVIDER_SITE_OTHER): Payer: BC Managed Care – HMO | Admitting: Family

## 2012-12-10 ENCOUNTER — Ambulatory Visit (HOSPITAL_BASED_OUTPATIENT_CLINIC_OR_DEPARTMENT_OTHER)
Admission: RE | Admit: 2012-12-10 | Discharge: 2012-12-10 | Disposition: A | Discharge status: Home / Self Care | Payer: Medicare Other | Source: Ambulatory Visit | Attending: Family | Admitting: Family

## 2012-12-10 ENCOUNTER — Encounter: Payer: Self-pay | Admitting: Cardiology

## 2012-12-10 ENCOUNTER — Encounter: Payer: Self-pay | Admitting: Family

## 2012-12-10 ENCOUNTER — Other Ambulatory Visit: Payer: Self-pay | Admitting: Family

## 2012-12-10 VITALS — BP 126/74 | HR 73 | Temp 97.6°F | Resp 16

## 2012-12-10 DIAGNOSIS — E7800 Pure hypercholesterolemia, unspecified: Secondary | ICD-10-CM

## 2012-12-10 DIAGNOSIS — Z860100 Personal history of colon polyps, unspecified: Secondary | ICD-10-CM

## 2012-12-10 DIAGNOSIS — Z1231 Encounter for screening mammogram for malignant neoplasm of breast: Secondary | ICD-10-CM

## 2012-12-10 DIAGNOSIS — R32 Unspecified urinary incontinence: Secondary | ICD-10-CM

## 2012-12-10 DIAGNOSIS — I10 Essential (primary) hypertension: Secondary | ICD-10-CM

## 2012-12-10 DIAGNOSIS — R7301 Impaired fasting glucose: Secondary | ICD-10-CM

## 2012-12-10 DIAGNOSIS — K2270 Barrett's esophagus without dysplasia: Secondary | ICD-10-CM

## 2012-12-10 DIAGNOSIS — Z1239 Encounter for other screening for malignant neoplasm of breast: Secondary | ICD-10-CM

## 2012-12-10 DIAGNOSIS — Z0000 Encounter for general adult medical examination without abnormal findings: Secondary | ICD-10-CM

## 2012-12-10 NOTE — Assessment & Plan Note (Signed)
a1c 5.7 last visit.  Discussed diet/exercise.

## 2012-12-10 NOTE — Assessment & Plan Note (Signed)
Last colo neg 2008- plan IFOB next visit.

## 2012-12-10 NOTE — Assessment & Plan Note (Signed)
BP stable on amlodipine/accuretic- continue same.

## 2012-12-10 NOTE — Progress Notes (Signed)
Subjective:    Patient ID: [name], female    DOB: 1940-10-29, 72 y.o.   MRN: [id number]  HPI    Review of Systems Past Medical History  Diagnosis Date  . Hyperlipidemia   . Hypertension   . Osteoporosis     pt cannot tolerate fosamax  . Arthritis     osteoarthritis  . Barrett's esophagus   . GERD (gastroesophageal reflux disease)   . Chronic obstructive bronchitis     Dr Delford Field  . IFG (impaired fasting glucose)   . Colon polyps   . History of nephrolithiasis   . History of chicken pox   . Positive PPD     exposure to 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 Positive PPD only, negative CXR.  [name] History of transfusion of packed red blood cells   . Urinary incontinence     History   Social History  . Marital Status: Widowed    Spouse Name: N/A    Number of Children: N/A  . Years of Education: N/A   Occupational History  . Retired    Social History Main Topics  . Smoking status: Former Smoker    Quit date: 05/15/1967  . Smokeless tobacco: Never Used  . Alcohol Use: No  . Drug Use: No  . Sexually Active: Not on file   Other Topics Concern  . Not on file   Social History Narrative   Regular exercise: yes    Past Surgical History  Procedure Laterality Date  . Abdominal hysterectomy  1970  . Appendectomy    . Cholecystectomy    . Total knee arthroplasty  04/2006    total left knee replacement  . Leg surgery  2008    right    Family History  Problem Relation Age of Onset  . Asthma Mother   . Coronary artery disease    . Hyperlipidemia    . Hypertension    . Arthritis    . Tuberculosis      Allergies  Allergen Reactions  . Amoxicillin-Pot Clavulanate     REACTION: unspecified  . Atorvastatin     REACTION: MUSCLE PAINS  . Diclofenac-Misoprostol     REACTION: NVD  . Fenofibrate     REACTION: carpopedal spasms  . Penicillins     REACTION: welps  . Rosuvastatin     REACTION: MUSCLE PAINS  . Statins   . Sulfonamide Derivatives   . Tuberculin Ppd      Current Outpatient Prescriptions on File Prior to Visit  Medication Sig Dispense Refill  . amLODipine (NORVASC) 5 MG tablet Take 1 tablet (5 mg total) by mouth daily.  90 tablet  1  . aspirin 325 MG tablet Take 325 mg by mouth daily.        . beta carotene 15 MG capsule Take 15 mg by mouth daily.        . bifidobacterium infantis (ALIGN) capsule Take 1 capsule by mouth daily.        . Calcium Carbonate-Vitamin D (CALTRATE 600+D) 600-400 MG-UNIT per tablet Take 1 tablet by mouth 2 (two) times daily.      . Coenzyme Q10 200 MG capsule Take 200 mg by mouth daily.        [name] ezetimibe (ZETIA) 10 MG tablet Take 1 tablet (10 mg total) by mouth daily.  90 tablet  1  . fluticasone (FLONASE) 50 MCG/ACT nasal spray Place 2 sprays into the nose daily as needed. Each nostril.  1 g  5  .  ibuprofen (ADVIL,MOTRIN) 200 MG tablet Take 200 mg by mouth. Take 1 tablet as needed for pain.       . Magnesium 500 MG TABS Take 1 tablet by mouth daily.        . Multiple Vitamin (MULTIVITAMIN) tablet Take 1 tablet by mouth daily.        . niacin 500 MG tablet 3 tabs by mouth at bedtime      . Omega-3 Fatty Acids (FISH OIL) 1000 MG CAPS Take 2 capsules by mouth 2 (two) times daily.        [name] omeprazole (PRILOSEC) 20 MG capsule Take 1 capsule (20 mg total) by mouth 2 (two) times daily.  180 capsule  1  . potassium chloride (MICRO-K) 10 MEQ CR capsule Take 1 capsule (10 mEq total) by mouth daily.  90 capsule  1  . quinapril-hydrochlorothiazide (ACCURETIC) 20-25 MG per tablet Take 1 tablet by mouth daily.  90 tablet  1  . vitamin E (VITAMIN E) 400 UNIT capsule Take 400 Units by mouth daily.         No current facility-administered medications on file prior to visit.    BP 126/74  Pulse 73  Temp(Src) 97.6 F (36.4 C) (Oral)  Resp 16  SpO2 97%  LMP 05/14/1968       Objective:   Physical Exam        Assessment & Plan:

## 2012-12-10 NOTE — Assessment & Plan Note (Signed)
Lipids high last visit.  She has not changed diet so I would not expect much change in her lipids at this time.  Recommended that she work hard on diet. Continue zetia, fish oil and niacin.

## 2012-12-10 NOTE — Assessment & Plan Note (Signed)
At baseline, she does not wish to pursue medical rx or surgical intervention. Monitor.

## 2012-12-10 NOTE — Assessment & Plan Note (Signed)
Clinically stable on PPI, continue same.  

## 2012-12-10 NOTE — Patient Instructions (Addendum)
Please schedule your mammogram on the first floor. Keep up the good work with exercise. Work on low fat/low cholesterol diet.  Follow up in 6 months, sooner if problems/concerns.

## 2012-12-10 NOTE — Progress Notes (Signed)
Subjective:    Patient ID: [name], female    DOB: 09-09-1940, 72 y.o.   MRN: [id number]  HPI  [name] is a 72 yr old female who presents today for her medicare wellness visit.  Subjective:   Patient here for Medicare annual wellness visit and management of other chronic and acute problems.  Immunizations: tetanus is due, pneumovax complete, zostavax due- declines tetanus, declines zostavax Diet: reports that she is "not being good." Exercise: she has started exerising 30 minutes a day Colonoscopy: due 2018 Dexa: up to date Pap Smear: declines bimanual Mammogram: due  HTN- stable on amlodipine  Hyperlipidemia- on zetia, fish oil, niacin  Barrett's Esophagus- she continue prilosec. Denies reflux symptoms.  Impaired fasting glucose- A1C 5.7.  Osteopenia- she is currently on caltrate and exercising  Risk factors: at risk for CAD due to hx of HTN and Hyperlipidemia  Roster of Physicians Providing Medical Care to Patient: None  Activities of Daily Living  In your present state of health, do you have any difficulty performing the following activities? Preparing food and eating?: No  Bathing yourself: No  Getting dressed: No  Using the toilet:No  Moving around from place to place: No  In the past year have you fallen or had a near fall?:No    Home Safety: Has smoke detector and wears seat belts. Has firearms in a safe. No excess sun exposure.  Diet and Exercise  Current exercise habits: 30 min a day Dietary issues discussed: healthy diet   Depression Screen  (Note: if answer to either of the following is "Yes", then a more complete depression screening is indicated)  Q1: Over the past two weeks, have you felt down, depressed or hopeless?no  Q2: Over the past two weeks, have you felt little interest or pleasure in doing things? no   The following portions of the patient's history were reviewed and updated as appropriate: allergies, current medications, past  family history, past medical history, past social history, past surgical history and problem list.   Objective:   Vision: see nursing Hearing: able to hear forced whisper at 6 feet Body mass index: see nursing Cognitive Impairment Assessment: cognition, memory and judgment appear normal.   Assessment:   Medicare wellness utd on preventive parameters except for mammogram  Plan:   During the course of the visit the patient was educated and counseled about appropriate screening and preventive services including:       Fall prevention   Screening mammography  Bone densitometry screening  Diabetes screening  Nutrition counseling   Vaccines / LABS  Labs up to date Patient Instructions (the written plan) was given to the patient.      Review of Systems  Constitutional: Negative for unexpected weight change.  HENT: Negative for congestion.   Eyes:       Cataracts- following with opthalmology  Respiratory: Negative for cough and shortness of breath.   Cardiovascular: Negative for chest pain.  Gastrointestinal: Negative for nausea, vomiting, diarrhea and blood in stool.  Genitourinary:       Reports + stress incontinence.  Musculoskeletal:       Reports some low back arthritis  Skin: Negative for rash.  Neurological: Negative for headaches.  Hematological: Negative for adenopathy.  Psychiatric/Behavioral:       Denies depression       Objective:   Physical Exam  Constitutional: She is oriented to person, place, and time. She appears well-developed and well-nourished. No distress.  HENT:  Head:  Normocephalic and atraumatic.  Right Ear: Tympanic membrane and ear canal normal.  Left Ear: Tympanic membrane and ear canal normal.  Mouth/Throat: No oropharyngeal exudate, posterior oropharyngeal edema or posterior oropharyngeal erythema.  Cardiovascular: Normal rate and regular rhythm.   No murmur heard. Pulmonary/Chest: Effort normal and breath sounds normal. No respiratory  distress. She has no wheezes. She has no rales. She exhibits no tenderness.  Abdominal: Soft.  Genitourinary:  Breast exam- no masses retractions, thickening noted  Musculoskeletal: She exhibits no edema.  Neurological: She is alert and oriented to person, place, and time.  Psychiatric: She has a normal mood and affect. Her behavior is normal. Judgment and thought content normal.          Assessment & Plan:

## 2012-12-10 NOTE — Assessment & Plan Note (Signed)
>>  ASSESSMENT AND PLAN FOR HYPERCHOLESTEROLEMIA WRITTEN ON 12/10/2012  9:33 AM BY O'SULLIVAN, Wheeler Incorvaia, NP  Lipids high last visit.  She has not changed diet so I would not expect much change in her lipids at this time.  Recommended that she work hard on diet. Continue zetia , fish oil and niacin .

## 2012-12-10 NOTE — Assessment & Plan Note (Signed)
>>  ASSESSMENT AND PLAN FOR ESSENTIAL HYPERTENSION WRITTEN ON 12/10/2012  9:31 AM BY O'SULLIVAN, Amani Nodarse, NP  BP stable on amlodipine/accuretic- continue same.

## 2013-03-11 ENCOUNTER — Telehealth: Payer: Self-pay | Admitting: *Deleted

## 2013-03-11 MED ORDER — QUINAPRIL-HYDROCHLOROTHIAZIDE 20-25 MG PO TABS: 1.0000 | ORAL_TABLET | Freq: Every day | ORAL | Status: DC

## 2013-03-11 MED ORDER — AMLODIPINE BESYLATE 5 MG PO TABS: 5.0000 mg | ORAL_TABLET | Freq: Every day | ORAL | Status: DC

## 2013-03-11 MED ORDER — EZETIMIBE 10 MG PO TABS: 10.0000 mg | ORAL_TABLET | Freq: Every day | ORAL | Status: DC

## 2013-03-11 NOTE — Telephone Encounter (Signed)
Pt called back and left message that she would like [place] to send them electronically. Refills sent.

## 2013-03-11 NOTE — Telephone Encounter (Signed)
Received message from pt requesting refills of:  Zetia, quinipril hctz and amlodipine to [facility] and then requested rxs be mailed to her.  Left detailed message for pt to return my call and clarify if she is wanting [place] to send Rxs to her to mail or can we send them electronically?[name]

## 2013-04-20 ENCOUNTER — Other Ambulatory Visit: Payer: Self-pay

## 2013-04-20 MED ORDER — POTASSIUM CHLORIDE ER 10 MEQ PO CPCR: 10.0000 meq | ORAL_CAPSULE | Freq: Every day | ORAL | Status: DC

## 2013-04-20 NOTE — Telephone Encounter (Signed)
Pt left a message that she needs her Potassium sent to mail order.  RX sent

## 2013-06-08 ENCOUNTER — Ambulatory Visit (INDEPENDENT_AMBULATORY_CARE_PROVIDER_SITE_OTHER): Payer: Medicare Other | Admitting: Family

## 2013-06-08 ENCOUNTER — Encounter: Payer: Self-pay | Admitting: Family

## 2013-06-08 VITALS — BP 122/78 | HR 98 | Temp 98.5°F | Resp 18 | Ht 62.0 in | Wt 181.0 lb

## 2013-06-08 DIAGNOSIS — R739 Hyperglycemia, unspecified: Secondary | ICD-10-CM

## 2013-06-08 DIAGNOSIS — N952 Postmenopausal atrophic vaginitis: Secondary | ICD-10-CM

## 2013-06-08 DIAGNOSIS — I10 Essential (primary) hypertension: Secondary | ICD-10-CM

## 2013-06-08 DIAGNOSIS — E7800 Pure hypercholesterolemia, unspecified: Secondary | ICD-10-CM

## 2013-06-08 DIAGNOSIS — L293 Anogenital pruritus, unspecified: Secondary | ICD-10-CM

## 2013-06-08 DIAGNOSIS — N898 Other specified noninflammatory disorders of vagina: Secondary | ICD-10-CM

## 2013-06-08 DIAGNOSIS — R7309 Other abnormal glucose: Secondary | ICD-10-CM

## 2013-06-08 DIAGNOSIS — E785 Hyperlipidemia, unspecified: Secondary | ICD-10-CM

## 2013-06-08 LAB — WET PREP BY MOLECULAR PROBE
CANDIDA SPECIES: NEGATIVE
Gardnerella vaginalis: NEGATIVE
Trichomonas vaginosis: NEGATIVE

## 2013-06-08 MED ORDER — EZETIMIBE 10 MG PO TABS: 10.0000 mg | ORAL_TABLET | Freq: Every day | ORAL | Status: DC

## 2013-06-08 MED ORDER — OMEPRAZOLE 20 MG PO CPDR: 20.0000 mg | DELAYED_RELEASE_CAPSULE | Freq: Two times a day (BID) | ORAL | Status: DC

## 2013-06-08 MED ORDER — AMLODIPINE BESYLATE 5 MG PO TABS: 5.0000 mg | ORAL_TABLET | Freq: Every day | ORAL | Status: DC

## 2013-06-08 MED ORDER — QUINAPRIL-HYDROCHLOROTHIAZIDE 20-25 MG PO TABS: 1.0000 | ORAL_TABLET | Freq: Every day | ORAL | Status: DC

## 2013-06-08 MED ORDER — FLUTICASONE PROPIONATE 50 MCG/ACT NA SUSP: 2.0000 | Freq: Every day | NASAL | Status: DC | PRN

## 2013-06-08 MED ORDER — POTASSIUM CHLORIDE ER 10 MEQ PO CPCR: 10.0000 meq | ORAL_CAPSULE | Freq: Every day | ORAL | Status: DC

## 2013-06-08 NOTE — Patient Instructions (Signed)
Please complete your lab work prior to leaving. Follow up in 6 months.  

## 2013-06-08 NOTE — Progress Notes (Signed)
Pre visit review using our clinic review tool, if applicable. No additional management support is needed unless otherwise documented below in the visit note. 

## 2013-06-08 NOTE — Progress Notes (Signed)
Subjective:    Patient ID: Miranda DickSylvia W [name], female    DOB: [birth year], 73 y.o.   MRN: [id number]  HPI  HTN-  Currently maintained on amlodipine 5mg .  Accuretic,   Hyperlipidemia-  On zetia and fish oil and intolerant to statins.  Declines prevnar vaccine  Vaginal discharge-  Off/on x 3 months.  Use OTC monistat which helped briefly. + itching, white discharge.  Review of Systems See HPI  Past Medical History  Diagnosis Date  . Hyperlipidemia   . Hypertension   . Osteoporosis     pt cannot tolerate fosamax  . Arthritis     osteoarthritis  . Barrett's esophagus   . GERD (gastroesophageal reflux disease)   . Chronic obstructive bronchitis     Dr Delford FieldWright  . IFG (impaired fasting glucose)   . Colon polyps   . History of nephrolithiasis   . History of chicken pox   . Positive PPD     exposure to 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 Positive PPD only, negative CXR.  [name]. History of transfusion of packed red blood cells   . Urinary incontinence     History   Social History  . Marital Status: Widowed    Spouse Name: N/A    Number of Children: N/A  . Years of Education: N/A   Occupational History  . Retired    Social History Main Topics  . Smoking status: Former Smoker -- 15 years    Quit date: 05/15/1967  . Smokeless tobacco: Never Used  . Alcohol Use: No  . Drug Use: No  . Sexual Activity: Not on file   Other Topics Concern  . Not on file   Social History Narrative   Regular exercise: yes    Past Surgical History  Procedure Laterality Date  . Abdominal hysterectomy  1970  . Appendectomy    . Cholecystectomy    . Total knee arthroplasty  04/2006    total left knee replacement  . Leg surgery  2008    right    Family History  Problem Relation Age of Onset  . Asthma Mother   . Coronary artery disease    . Hyperlipidemia    . Hypertension    . Arthritis    . Tuberculosis      Allergies  Allergen Reactions  . Amoxicillin-Pot Clavulanate     REACTION:  unspecified  . Atorvastatin     REACTION: MUSCLE PAINS  . Diclofenac-Misoprostol     REACTION: NVD  . Fenofibrate     REACTION: carpopedal spasms  . Penicillins     REACTION: welps  . Rosuvastatin     REACTION: MUSCLE PAINS  . Statins   . Sulfonamide Derivatives   . Tuberculin Ppd     Current Outpatient Prescriptions on File Prior to Visit  Medication Sig Dispense Refill  . amLODipine (NORVASC) 5 MG tablet Take 1 tablet (5 mg total) by mouth daily.  90 tablet  1  . Ascorbic Acid (VITAMIN C) 1000 MG tablet Take 1,000 mg by mouth daily.      [name]. aspirin 325 MG tablet Take 325 mg by mouth daily.        . beta carotene 15 MG capsule Take 15 mg by mouth daily.        . bifidobacterium infantis (ALIGN) capsule Take 1 capsule by mouth daily.        . Calcium Carbonate-Vitamin D (CALTRATE 600+D) 600-400 MG-UNIT per tablet Take 1 tablet by mouth 2 (two) times daily.      .[name]  Coenzyme Q10 200 MG capsule Take 200 mg by mouth daily.        [name] ezetimibe (ZETIA) 10 MG tablet Take 1 tablet (10 mg total) by mouth daily.  90 tablet  1  . fluticasone (FLONASE) 50 MCG/ACT nasal spray Place 2 sprays into the nose daily as needed. Each nostril.  1 g  5  . ibuprofen (ADVIL,MOTRIN) 200 MG tablet Take 200 mg by mouth. Take 1 tablet as needed for pain.       . Magnesium 500 MG TABS Take 1 tablet by mouth daily.        . Multiple Vitamin (MULTIVITAMIN) tablet Take 1 tablet by mouth daily.        . niacin 500 MG tablet 3 tabs by mouth at bedtime      . Omega-3 Fatty Acids (FISH OIL) 1000 MG CAPS Take 2 capsules by mouth 2 (two) times daily.        [name] omeprazole (PRILOSEC) 20 MG capsule Take 1 capsule (20 mg total) by mouth 2 (two) times daily.  180 capsule  1  . potassium chloride (MICRO-K) 10 MEQ CR capsule Take 1 capsule (10 mEq total) by mouth daily.  90 capsule  0  . quinapril-hydrochlorothiazide (ACCURETIC) 20-25 MG per tablet Take 1 tablet by mouth daily.  90 tablet  1  . vitamin E (VITAMIN E) 400 UNIT  capsule Take 400 Units by mouth daily.         No current facility-administered medications on file prior to visit.    BP 122/78  Pulse 98  Temp(Src) 98.5 F (36.9 C) (Oral)  Resp 18  Ht 5\' 2"  (1.575 m)  Wt 181 lb (82.101 kg)  BMI 33.10 kg/m2  SpO2 99%  LMP 05/14/1968       Objective:   Physical Exam  Constitutional: She is oriented to person, place, and time. She appears well-developed and well-nourished. No distress.  HENT:  Head: Normocephalic and atraumatic.  Cardiovascular: Normal rate and regular rhythm.   No murmur heard. Pulmonary/Chest: Effort normal and breath sounds normal. No respiratory distress. She has no wheezes. She has no rales. She exhibits no tenderness.  Genitourinary:  Vaginal tissues- atrophic, some slight scabbing noted on labia minora from excoration  Musculoskeletal: She exhibits no edema.  Neurological: She is alert and oriented to person, place, and time.  Skin: Skin is warm and dry.  Psychiatric: She has a normal mood and affect. Her behavior is normal. Judgment and thought content normal.          Assessment & Plan:

## 2013-06-09 LAB — HEPATIC FUNCTION PANEL
ALK PHOS: 67 U/L (ref 39–117)
ALT: 22 U/L (ref 0–35)
AST: 20 U/L (ref 0–37)
Albumin: 4.6 g/dL (ref 3.5–5.2)
BILIRUBIN DIRECT: 0.1 mg/dL (ref 0.0–0.3)
Indirect Bilirubin: 0.2 mg/dL (ref 0.0–0.9)
Total Bilirubin: 0.3 mg/dL (ref 0.3–1.2)
Total Protein: 7.1 g/dL (ref 6.0–8.3)

## 2013-06-09 LAB — BASIC METABOLIC PANEL
BUN: 19 mg/dL (ref 6–23)
CHLORIDE: 99 meq/L (ref 96–112)
CO2: 25 meq/L (ref 19–32)
Calcium: 10.1 mg/dL (ref 8.4–10.5)
Creat: 0.69 mg/dL (ref 0.50–1.10)
GLUCOSE: 102 mg/dL — AB (ref 70–99)
Potassium: 4.4 mEq/L (ref 3.5–5.3)
SODIUM: 135 meq/L (ref 135–145)

## 2013-06-09 LAB — HEMOGLOBIN A1C
Hgb A1c MFr Bld: 6 % — ABNORMAL HIGH (ref <5.7)
Mean Plasma Glucose: 126 mg/dL — ABNORMAL HIGH (ref <117)

## 2013-06-11 DIAGNOSIS — N952 Postmenopausal atrophic vaginitis: Secondary | ICD-10-CM | POA: Insufficient documentation

## 2013-06-11 NOTE — Assessment & Plan Note (Signed)
Wet prep negative.  She is not sexually active and not interested in vaginal estroen.  Recommend reples vaginal moisturizer.

## 2013-06-11 NOTE — Assessment & Plan Note (Signed)
Continue zetia, fish oil, low cholesterol diet.

## 2013-06-11 NOTE — Assessment & Plan Note (Signed)
>>  ASSESSMENT AND PLAN FOR HYPERCHOLESTEROLEMIA WRITTEN ON 06/11/2013  8:21 AM BY O'SULLIVAN, Gleen Ripberger, NP  Continue zetia , fish oil, low cholesterol diet.

## 2013-06-11 NOTE — Assessment & Plan Note (Signed)
>>  ASSESSMENT AND PLAN FOR ESSENTIAL HYPERTENSION WRITTEN ON 06/11/2013  8:20 AM BY O'SULLIVAN, Hildegard Hlavac, NP  Blood pressure stable on current meds.  Continue same. Obtain bmet

## 2013-06-11 NOTE — Assessment & Plan Note (Signed)
Blood pressure stable on current meds.  Continue same. Obtain bmet

## 2013-06-22 ENCOUNTER — Telehealth: Payer: Self-pay | Admitting: *Deleted

## 2013-06-22 NOTE — Telephone Encounter (Signed)
Received message from pt stating her insurance only allowed her to get #60 of her omeprazole and said we need to provide them with additional information. We have not received fax from any pharmacy requesting additional info. Left message for pt to return my call and tell us which pharmacy rx was filled at. Will then follow up with pharmacy to clarify information.

## 2013-06-23 NOTE — Telephone Encounter (Signed)
Pt called back stating [facility] was who told her we would need to contact her insurance company re: omeprazole rx.

## 2013-06-26 NOTE — Telephone Encounter (Signed)
Please start a PA.

## 2013-07-03 NOTE — Telephone Encounter (Signed)
Form received, forward to nurse °

## 2013-07-03 NOTE — Telephone Encounter (Signed)
Does not need PA, just needs from filled out for quantity, awaiting fax

## 2013-07-06 NOTE — Telephone Encounter (Signed)
Form faxed to [phone], awaiting approval / denial status.

## 2013-07-06 NOTE — Telephone Encounter (Signed)
Form completed and forwarded to Provider for signature. 

## 2013-07-14 NOTE — Telephone Encounter (Signed)
[facility] [phone] and was advised authorization has been obtained from 07/06/13 through 07/06/14.  Notified Joni ReiningNicole at [employer] and pt. Advised pt she will have to contact mail order to request the remaining rx.

## 2013-11-17 ENCOUNTER — Encounter: Payer: Self-pay | Admitting: Family

## 2013-11-17 ENCOUNTER — Ambulatory Visit (INDEPENDENT_AMBULATORY_CARE_PROVIDER_SITE_OTHER): Payer: Medicare Other | Admitting: Family

## 2013-11-17 ENCOUNTER — Telehealth: Payer: Self-pay | Admitting: Family

## 2013-11-17 VITALS — BP 124/80 | HR 79 | Temp 98.5°F | Resp 16 | Ht 62.0 in | Wt 182.1 lb

## 2013-11-17 DIAGNOSIS — R7309 Other abnormal glucose: Secondary | ICD-10-CM

## 2013-11-17 DIAGNOSIS — R7303 Prediabetes: Secondary | ICD-10-CM | POA: Insufficient documentation

## 2013-11-17 DIAGNOSIS — R739 Hyperglycemia, unspecified: Secondary | ICD-10-CM

## 2013-11-17 DIAGNOSIS — R609 Edema, unspecified: Secondary | ICD-10-CM

## 2013-11-17 DIAGNOSIS — I10 Essential (primary) hypertension: Secondary | ICD-10-CM

## 2013-11-17 DIAGNOSIS — E785 Hyperlipidemia, unspecified: Secondary | ICD-10-CM

## 2013-11-17 DIAGNOSIS — E7800 Pure hypercholesterolemia, unspecified: Secondary | ICD-10-CM

## 2013-11-17 LAB — HEPATIC FUNCTION PANEL
ALK PHOS: 73 U/L (ref 39–117)
ALT: 31 U/L (ref 0–35)
AST: 21 U/L (ref 0–37)
Albumin: 4.2 g/dL (ref 3.5–5.2)
BILIRUBIN DIRECT: 0.1 mg/dL (ref 0.0–0.3)
Indirect Bilirubin: 0.3 mg/dL (ref 0.2–1.2)
Total Bilirubin: 0.4 mg/dL (ref 0.2–1.2)
Total Protein: 6.6 g/dL (ref 6.0–8.3)

## 2013-11-17 LAB — BASIC METABOLIC PANEL WITH GFR
BUN: 13 mg/dL (ref 6–23)
CHLORIDE: 102 meq/L (ref 96–112)
CO2: 26 meq/L (ref 19–32)
Calcium: 9.4 mg/dL (ref 8.4–10.5)
Creat: 0.72 mg/dL (ref 0.50–1.10)
GFR, Est African American: >89 mL/min
GFR, Est Non African American: 84 mL/min
GLUCOSE: 92 mg/dL (ref 70–99)
Potassium: 4.5 mEq/L (ref 3.5–5.3)
SODIUM: 139 meq/L (ref 135–145)

## 2013-11-17 LAB — LIPID PANEL
CHOL/HDL RATIO: 4.4 ratio
Cholesterol: 244 mg/dL — ABNORMAL HIGH (ref 0–200)
HDL: 55 mg/dL (ref >39)
LDL CALC: 159 mg/dL — AB (ref 0–99)
Triglycerides: 150 mg/dL — ABNORMAL HIGH (ref <150)
VLDL: 30 mg/dL (ref 0–40)

## 2013-11-17 LAB — HEMOGLOBIN A1C
Hgb A1c MFr Bld: 5.9 % — ABNORMAL HIGH (ref <5.7)
MEAN PLASMA GLUCOSE: 123 mg/dL — AB (ref <117)

## 2013-11-17 MED ORDER — OMEPRAZOLE 20 MG PO CPDR: 20.0000 mg | DELAYED_RELEASE_CAPSULE | Freq: Two times a day (BID) | ORAL | Status: DC

## 2013-11-17 MED ORDER — AMLODIPINE BESYLATE 5 MG PO TABS: 5.0000 mg | ORAL_TABLET | Freq: Every day | ORAL | Status: DC

## 2013-11-17 MED ORDER — POTASSIUM CHLORIDE ER 10 MEQ PO CPCR: 10.0000 meq | ORAL_CAPSULE | Freq: Every day | ORAL | Status: DC

## 2013-11-17 MED ORDER — EZETIMIBE 10 MG PO TABS: 10.0000 mg | ORAL_TABLET | Freq: Every day | ORAL | Status: DC

## 2013-11-17 MED ORDER — QUINAPRIL-HYDROCHLOROTHIAZIDE 20-25 MG PO TABS: 1.0000 | ORAL_TABLET | Freq: Every day | ORAL | Status: DC

## 2013-11-17 NOTE — Progress Notes (Signed)
Subjective:    Patient ID: [name], female    DOB: December 29, 1940, 73 y.o.   MRN: [id number]  HPI  [name] is a 73 yr old female who presents today for follow up of multiple medical problems:  1) Hyperlipidemia-she is intolerant to statins.  Tolerating zetia and fish oil.    2) HTN- Current blood pressure meds include. Accuretic and amlodipine. Edema is improved with elevation. Worse at night.  Denies CP or SOB.  BP Readings from Last 3 Encounters:  11/17/13 124/80  06/08/13 122/78  12/10/12 126/74   3) Hyperglycemia- denies polyuria/polydipsia.   Lab Results  Component Value Date   HGBA1C 6.0* 06/08/2013   4) Edema- pt notes intermittent swelling of both feet.   Notes mild sore throat for 1 week.  Reports it was painful to swallow at first, mild cough.  Sore throat is improving but not resolved.    Review of Systems    see HPI  Past Medical History  Diagnosis Date  . Hyperlipidemia   . Hypertension   . Osteoporosis     pt cannot tolerate fosamax  . Arthritis     osteoarthritis  . Barrett's esophagus   . GERD (gastroesophageal reflux disease)   . Chronic obstructive bronchitis     Dr Delford FieldWright  . IFG (impaired fasting glucose)   . Colon polyps   . History of nephrolithiasis   . History of chicken pox   . Positive PPD     exposure to 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 Positive PPD only, negative CXR.  [name]. History of transfusion of packed red blood cells   . Urinary incontinence     History   Social History  . Marital Status: Widowed    Spouse Name: N/A    Number of Children: N/A  . Years of Education: N/A   Occupational History  . Retired    Social History Main Topics  . Smoking status: Former Smoker -- 15 years    Quit date: 05/15/1967  . Smokeless tobacco: Never Used  . Alcohol Use: No  . Drug Use: No  . Sexual Activity: Not on file   Other Topics Concern  . Not on file   Social History Narrative   Regular exercise: yes    Past Surgical History    Procedure Laterality Date  . Abdominal hysterectomy  1970  . Appendectomy    . Cholecystectomy    . Total knee arthroplasty  04/2006    total left knee replacement  . Leg surgery  2008    right    Family History  Problem Relation Age of Onset  . Asthma Mother   . Coronary artery disease    . Hyperlipidemia    . Hypertension    . Arthritis    . Tuberculosis      Allergies  Allergen Reactions  . Amoxicillin-Pot Clavulanate     REACTION: unspecified  . Atorvastatin     REACTION: MUSCLE PAINS  . Diclofenac-Misoprostol     REACTION: NVD  . Fenofibrate     REACTION: carpopedal spasms  . Penicillins     REACTION: welps  . Rosuvastatin     REACTION: MUSCLE PAINS  . Statins   . Sulfonamide Derivatives   . Tuberculin Ppd     Current Outpatient Prescriptions on File Prior to Visit  Medication Sig Dispense Refill  . amLODipine (NORVASC) 5 MG tablet Take 1 tablet (5 mg total) by mouth daily.  90 tablet  1  .  Ascorbic Acid (VITAMIN C) 1000 MG tablet Take 1,000 mg by mouth daily.      [name]. aspirin 325 MG tablet Take 325 mg by mouth daily.        . beta carotene 15 MG capsule Take 15 mg by mouth daily.        . bifidobacterium infantis (ALIGN) capsule Take 1 capsule by mouth daily.        . Calcium Carbonate-Vitamin D (CALTRATE 600+D) 600-400 MG-UNIT per tablet Take 1 tablet by mouth 2 (two) times daily.      . Coenzyme Q10 200 MG capsule Take 200 mg by mouth daily.        [name]. ezetimibe (ZETIA) 10 MG tablet Take 1 tablet (10 mg total) by mouth daily.  90 tablet  1  . fluticasone (FLONASE) 50 MCG/ACT nasal spray Place 2 sprays into both nostrils daily as needed. Each nostril.  1 g  5  . ibuprofen (ADVIL,MOTRIN) 200 MG tablet Take 200 mg by mouth. Take 1 tablet as needed for pain.       . Magnesium 500 MG TABS Take 1 tablet by mouth daily.        . Multiple Vitamin (MULTIVITAMIN) tablet Take 1 tablet by mouth daily.        . niacin 500 MG tablet 3 tabs by mouth at bedtime      .  Omega-3 Fatty Acids (FISH OIL) 1000 MG CAPS Take 2 capsules by mouth 2 (two) times daily.        [name]. omeprazole (PRILOSEC) 20 MG capsule Take 1 capsule (20 mg total) by mouth 2 (two) times daily.  180 capsule  1  . potassium chloride (MICRO-K) 10 MEQ CR capsule Take 1 capsule (10 mEq total) by mouth daily.  90 capsule  1  . quinapril-hydrochlorothiazide (ACCURETIC) 20-25 MG per tablet Take 1 tablet by mouth daily.  90 tablet  1  . vitamin E (VITAMIN E) 400 UNIT capsule Take 400 Units by mouth daily.         No current facility-administered medications on file prior to visit.    BP 124/80  Pulse 79  Temp(Src) 98.5 F (36.9 C) (Oral)  Resp 16  Ht 5\' 2"  (1.575 m)  Wt 182 lb 1.3 oz (82.591 kg)  BMI 33.29 kg/m2  SpO2 99%  LMP 05/14/1968    Objective:   Physical Exam  Constitutional: She is oriented to person, place, and time. She appears well-developed and well-nourished.  HENT:  Head: Normocephalic and atraumatic.  Cardiovascular: Normal rate and regular rhythm.   No murmur heard. Pulmonary/Chest: Effort normal and breath sounds normal. No respiratory distress. She has no wheezes. She has no rales. She exhibits no tenderness.  Musculoskeletal:  Trace bilateral LE edema noted  Neurological: She is alert and oriented to person, place, and time.  Psychiatric: She has a normal mood and affect. Her behavior is normal. Judgment and thought content normal.          Assessment & Plan:  Initially throat was not examined. Offered to bring pt back from lab for exam, she declined exam and notes that if symptoms worsen she will let us know.

## 2013-11-17 NOTE — Assessment & Plan Note (Signed)
>>  ASSESSMENT AND PLAN FOR HYPERCHOLESTEROLEMIA WRITTEN ON 11/17/2013  9:00 AM BY O'SULLIVAN, Damyiah Moxley, NP  Obtain flp/lft.  Pt to continue zetia . Advised pt on low fat/low cholesterol diet.

## 2013-11-17 NOTE — Assessment & Plan Note (Signed)
>>  ASSESSMENT AND PLAN FOR ESSENTIAL HYPERTENSION WRITTEN ON 11/17/2013  8:58 AM BY O'SULLIVAN, Lucas Winograd, NP  BP stable on current meds.  Continue same.

## 2013-11-17 NOTE — Progress Notes (Signed)
Pre visit review using our clinic review tool, if applicable. No additional management support is needed unless otherwise documented below in the visit note. 

## 2013-11-17 NOTE — Assessment & Plan Note (Signed)
Obtain flp/lft.  Pt to continue zetia. Advised pt on low fat/low cholesterol diet.

## 2013-11-17 NOTE — Assessment & Plan Note (Signed)
Declines addition of lasix. Will obtain 2D echo. Suspect some diastolic dysfunction. Clinically stable.

## 2013-11-17 NOTE — Patient Instructions (Addendum)
Check with your insurance re: Zostavax coverage (shingles vaccine). Then call us for nurse visit to schedule shot.   Please complete lab work prior to leaving. You will be contacted RE: echocardiogram.   Keep working on low fat/low cholesterol diet/exercise. Follow up in 6 months.

## 2013-11-17 NOTE — Telephone Encounter (Signed)
Relevant patient education assigned to patient using Emmi. ° °

## 2013-11-17 NOTE — Assessment & Plan Note (Signed)
BP stable on current meds. Continue same.  

## 2013-11-17 NOTE — Assessment & Plan Note (Signed)
Obtain follow up A1C.   

## 2013-11-17 NOTE — Assessment & Plan Note (Signed)
>>  ASSESSMENT AND PLAN FOR HYPERGLYCEMIA WRITTEN ON 11/17/2013  8:59 AM BY O'SULLIVAN, Laelah Siravo, NP  Obtain follow up A1C.

## 2013-11-18 ENCOUNTER — Encounter: Payer: Self-pay | Admitting: Family

## 2013-11-18 ENCOUNTER — Ambulatory Visit (HOSPITAL_BASED_OUTPATIENT_CLINIC_OR_DEPARTMENT_OTHER)
Admission: RE | Admit: 2013-11-18 | Discharge: 2013-11-18 | Disposition: A | Discharge status: Home / Self Care | Payer: Medicare Other | Source: Ambulatory Visit | Attending: Family | Admitting: Family

## 2013-11-18 DIAGNOSIS — R609 Edema, unspecified: Secondary | ICD-10-CM

## 2013-11-18 DIAGNOSIS — I517 Cardiomegaly: Secondary | ICD-10-CM

## 2013-11-18 NOTE — Progress Notes (Signed)
  Echocardiogram 2D Echocardiogram has been performed.  Arvil ChacoFoster, Angelle Isais 11/18/2013, 10:51 AM

## 2013-12-09 ENCOUNTER — Telehealth: Payer: Self-pay | Admitting: *Deleted

## 2013-12-09 NOTE — Telephone Encounter (Signed)
Message copied by Kathi SimpersFERGERSON, Haider Hornaday A on Wed Dec 09, 2013  9:44 AM ------      Message from: O'SULLIVAN, [name]      Created: Tue Nov 17, 2013  8:50 AM       Declines tetanus ------

## 2013-12-09 NOTE — Telephone Encounter (Signed)
Health Maintenance updated / postponed. 

## 2013-12-22 ENCOUNTER — Encounter: Payer: Self-pay | Admitting: Family

## 2013-12-22 ENCOUNTER — Ambulatory Visit (INDEPENDENT_AMBULATORY_CARE_PROVIDER_SITE_OTHER): Payer: Medicare Other | Admitting: Family

## 2013-12-22 VITALS — BP 120/80 | HR 71 | Temp 97.0°F | Resp 16 | Ht 62.0 in | Wt 179.0 lb

## 2013-12-22 DIAGNOSIS — N3001 Acute cystitis with hematuria: Secondary | ICD-10-CM

## 2013-12-22 DIAGNOSIS — N390 Urinary tract infection, site not specified: Secondary | ICD-10-CM

## 2013-12-22 DIAGNOSIS — N3000 Acute cystitis without hematuria: Secondary | ICD-10-CM

## 2013-12-22 LAB — POCT URINALYSIS DIPSTICK
BILIRUBIN UA: NEGATIVE
GLUCOSE UA: NEGATIVE
Ketones, UA: NEGATIVE
Nitrite, UA: NEGATIVE
Protein, UA: NEGATIVE
Urobilinogen, UA: 0.2
pH, UA: 6

## 2013-12-22 MED ORDER — CIPROFLOXACIN HCL 250 MG PO TABS: 250.0000 mg | ORAL_TABLET | Freq: Two times a day (BID) | ORAL | Status: DC

## 2013-12-22 NOTE — Progress Notes (Signed)
Pre visit review using our clinic review tool, if applicable. No additional management support is needed unless otherwise documented below in the visit note. 

## 2013-12-22 NOTE — Progress Notes (Signed)
Subjective:    Patient ID: [name], female    DOB: Jul 04, 1940, 73 y.o.   MRN: [id number]  HPI  [name] is a 73 yr old female who presents today with chief complaint of dysuria. Symptoms started 3 days ago and are associated with low back pain and frequency.  Reports overall malaise since yesterday, has had some urinary incontinence.  Denies associated fever.   Review of Systems See HPI  Past Medical History  Diagnosis Date  . Hyperlipidemia   . Hypertension   . Osteoporosis     pt cannot tolerate fosamax  . Arthritis     osteoarthritis  . Barrett's esophagus   . GERD (gastroesophageal reflux disease)   . Chronic obstructive bronchitis     Dr Delford Field  . IFG (impaired fasting glucose)   . Colon polyps   . History of nephrolithiasis   . History of chicken pox   . Positive PPD     exposure to 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 Positive PPD only, negative CXR.  [name] History of transfusion of packed red blood cells   . Urinary incontinence     History   Social History  . Marital Status: Widowed    Spouse Name: N/A    Number of Children: N/A  . Years of Education: N/A   Occupational History  . Retired    Social History Main Topics  . Smoking status: Former Smoker -- 15 years    Quit date: 05/15/1967  . Smokeless tobacco: Never Used  . Alcohol Use: No  . Drug Use: No  . Sexual Activity: Not on file   Other Topics Concern  . Not on file   Social History Narrative   Regular exercise: yes    Past Surgical History  Procedure Laterality Date  . Abdominal hysterectomy  1970  . Appendectomy    . Cholecystectomy    . Total knee arthroplasty  04/2006    total left knee replacement  . Leg surgery  2008    right    Family History  Problem Relation Age of Onset  . Asthma Mother   . Coronary artery disease    . Hyperlipidemia    . Hypertension    . Arthritis    . Tuberculosis      Allergies  Allergen Reactions  . Amoxicillin-Pot Clavulanate     REACTION:  unspecified  . Atorvastatin     REACTION: MUSCLE PAINS  . Diclofenac-Misoprostol     REACTION: NVD  . Fenofibrate     REACTION: carpopedal spasms  . Penicillins     REACTION: welps  . Rosuvastatin     REACTION: MUSCLE PAINS  . Statins   . Sulfonamide Derivatives   . Tuberculin Ppd     Current Outpatient Prescriptions on File Prior to Visit  Medication Sig Dispense Refill  . amLODipine (NORVASC) 5 MG tablet Take 1 tablet (5 mg total) by mouth daily.  90 tablet  1  . Ascorbic Acid (VITAMIN C) 1000 MG tablet Take 1,000 mg by mouth daily.      [name] aspirin 325 MG tablet Take 325 mg by mouth daily.        . beta carotene 15 MG capsule Take 15 mg by mouth daily.        . bifidobacterium infantis (ALIGN) capsule Take 1 capsule by mouth daily.        . Calcium Carbonate-Vitamin D (CALTRATE 600+D) 600-400 MG-UNIT per tablet Take 1 tablet by mouth 2 (two) times  daily.      . Coenzyme Q10 200 MG capsule Take 200 mg by mouth daily.        [name]. ezetimibe (ZETIA) 10 MG tablet Take 1 tablet (10 mg total) by mouth daily.  90 tablet  1  . fluticasone (FLONASE) 50 MCG/ACT nasal spray Place 2 sprays into both nostrils daily as needed. Each nostril.  1 g  5  . ibuprofen (ADVIL,MOTRIN) 200 MG tablet Take 200 mg by mouth. Take 1 tablet as needed for pain.       . Magnesium 500 MG TABS Take 1 tablet by mouth daily.        . Multiple Vitamin (MULTIVITAMIN) tablet Take 1 tablet by mouth daily.        . niacin 500 MG tablet 3 tabs by mouth at bedtime      . Omega-3 Fatty Acids (FISH OIL) 1000 MG CAPS Take 2 capsules by mouth 2 (two) times daily.        [name]. omeprazole (PRILOSEC) 20 MG capsule Take 1 capsule (20 mg total) by mouth 2 (two) times daily.  180 capsule  1  . potassium chloride (MICRO-K) 10 MEQ CR capsule Take 1 capsule (10 mEq total) by mouth daily.  90 capsule  1  . quinapril-hydrochlorothiazide (ACCURETIC) 20-25 MG per tablet Take 1 tablet by mouth daily.  90 tablet  1  . vitamin E (VITAMIN E) 400 UNIT  capsule Take 400 Units by mouth daily.         No current facility-administered medications on file prior to visit.    BP 120/80  Pulse 71  Temp(Src) 97 F (36.1 C) (Oral)  Resp 16  Ht 5\' 2"  (1.575 m)  Wt 179 lb (81.194 kg)  BMI 32.73 kg/m2  SpO2 99%  LMP 05/14/1968       Objective:   Physical Exam  Constitutional: She is oriented to person, place, and time. She appears well-developed and well-nourished. No distress.  Cardiovascular: Normal rate and regular rhythm.   No murmur heard. Pulmonary/Chest: Effort normal and breath sounds normal. No respiratory distress. She has no wheezes. She has no rales. She exhibits no tenderness.  Abdominal: Soft. Bowel sounds are normal. She exhibits no distension. There is no rebound.    Mild suprapubic tenderness Mild CVAT bilateral R>L  Neurological: She is alert and oriented to person, place, and time.  Psychiatric: She has a normal mood and affect. Her behavior is normal. Judgment and thought content normal.          Assessment & Plan:

## 2013-12-22 NOTE — Assessment & Plan Note (Signed)
UA today notes mod blood, mod leuks.  Due to mild CVAT will plan full 7 days of cipro. She is non-toxic appearing.  Will send urine for culture.

## 2013-12-22 NOTE — Patient Instructions (Signed)

## 2013-12-25 LAB — URINE CULTURE

## 2014-02-01 ENCOUNTER — Telehealth: Payer: Self-pay | Admitting: Family Medicine

## 2014-02-01 NOTE — Telephone Encounter (Signed)
Medication Detail      Disp Refills Start End     potassium chloride (MICRO-K) 10 MEQ CR capsule 90 capsule 1 11/17/2013     Sig - Route: Take 1 capsule (10 mEq total) by mouth daily. - Oral    E-Prescribing Status: Receipt confirmed by pharmacy (11/17/2013 8:54 AM EDT)    Pharmacy    [facility] - [place], [place] - [address]   Patient informed to call mail order pharmacy for available refill on file from 07.07.15 authorization, understood & agreed/SLS

## 2014-02-01 NOTE — Telephone Encounter (Signed)
Caller name: Donyea  Relation to pt: self  Call back number: [phone] Pharmacy: [facility] - [place], [place] - [address]   Reason for call:   pt requesting a refill of potassium chloride (MICRO-K) 10 MEQ CR capsule please send thru mail order.

## 2014-02-10 ENCOUNTER — Emergency Department (HOSPITAL_BASED_OUTPATIENT_CLINIC_OR_DEPARTMENT_OTHER): Payer: Medicare Other

## 2014-02-10 ENCOUNTER — Encounter (HOSPITAL_BASED_OUTPATIENT_CLINIC_OR_DEPARTMENT_OTHER): Payer: Self-pay | Admitting: Radiology

## 2014-02-10 ENCOUNTER — Inpatient Hospital Stay (HOSPITAL_BASED_OUTPATIENT_CLINIC_OR_DEPARTMENT_OTHER)
Admission: EM | Admit: 2014-02-10 | Discharge: 2014-02-17 | DRG: 372 | Disposition: A | Discharge status: Home / Self Care | Payer: Medicare Other | Attending: Internal Medicine | Admitting: Internal Medicine

## 2014-02-10 ENCOUNTER — Ambulatory Visit (INDEPENDENT_AMBULATORY_CARE_PROVIDER_SITE_OTHER): Payer: Medicare Other | Admitting: Family

## 2014-02-10 ENCOUNTER — Encounter: Payer: Self-pay | Admitting: Family

## 2014-02-10 VITALS — BP 150/72 | HR 114 | Temp 98.5°F | Resp 18 | Ht 62.0 in | Wt 173.2 lb

## 2014-02-10 DIAGNOSIS — M1990 Unspecified osteoarthritis, unspecified site: Secondary | ICD-10-CM | POA: Diagnosis present

## 2014-02-10 DIAGNOSIS — J449 Chronic obstructive pulmonary disease, unspecified: Secondary | ICD-10-CM | POA: Diagnosis present

## 2014-02-10 DIAGNOSIS — Z79899 Other long term (current) drug therapy: Secondary | ICD-10-CM

## 2014-02-10 DIAGNOSIS — R7301 Impaired fasting glucose: Secondary | ICD-10-CM | POA: Diagnosis present

## 2014-02-10 DIAGNOSIS — E785 Hyperlipidemia, unspecified: Secondary | ICD-10-CM | POA: Diagnosis present

## 2014-02-10 DIAGNOSIS — Z7982 Long term (current) use of aspirin: Secondary | ICD-10-CM

## 2014-02-10 DIAGNOSIS — M810 Age-related osteoporosis without current pathological fracture: Secondary | ICD-10-CM | POA: Diagnosis present

## 2014-02-10 DIAGNOSIS — E7800 Pure hypercholesterolemia, unspecified: Secondary | ICD-10-CM | POA: Diagnosis present

## 2014-02-10 DIAGNOSIS — R7611 Nonspecific reaction to tuberculin skin test without active tuberculosis: Secondary | ICD-10-CM | POA: Diagnosis present

## 2014-02-10 DIAGNOSIS — A044 Other intestinal Escherichia coli infections: Principal | ICD-10-CM | POA: Diagnosis present

## 2014-02-10 DIAGNOSIS — K567 Ileus, unspecified: Secondary | ICD-10-CM | POA: Diagnosis not present

## 2014-02-10 DIAGNOSIS — Z8249 Family history of ischemic heart disease and other diseases of the circulatory system: Secondary | ICD-10-CM

## 2014-02-10 DIAGNOSIS — K219 Gastro-esophageal reflux disease without esophagitis: Secondary | ICD-10-CM | POA: Diagnosis present

## 2014-02-10 DIAGNOSIS — Z9049 Acquired absence of other specified parts of digestive tract: Secondary | ICD-10-CM | POA: Diagnosis present

## 2014-02-10 DIAGNOSIS — Z882 Allergy status to sulfonamides status: Secondary | ICD-10-CM

## 2014-02-10 DIAGNOSIS — E871 Hypo-osmolality and hyponatremia: Secondary | ICD-10-CM | POA: Diagnosis not present

## 2014-02-10 DIAGNOSIS — B370 Candidal stomatitis: Secondary | ICD-10-CM | POA: Diagnosis not present

## 2014-02-10 DIAGNOSIS — K2270 Barrett's esophagus without dysplasia: Secondary | ICD-10-CM | POA: Diagnosis present

## 2014-02-10 DIAGNOSIS — I10 Essential (primary) hypertension: Secondary | ICD-10-CM | POA: Diagnosis present

## 2014-02-10 DIAGNOSIS — Z86010 Personal history of colonic polyps: Secondary | ICD-10-CM | POA: Diagnosis not present

## 2014-02-10 DIAGNOSIS — Z90711 Acquired absence of uterus with remaining cervical stump: Secondary | ICD-10-CM | POA: Diagnosis present

## 2014-02-10 DIAGNOSIS — A09 Infectious gastroenteritis and colitis, unspecified: Secondary | ICD-10-CM

## 2014-02-10 DIAGNOSIS — Z87442 Personal history of urinary calculi: Secondary | ICD-10-CM | POA: Diagnosis not present

## 2014-02-10 DIAGNOSIS — R32 Unspecified urinary incontinence: Secondary | ICD-10-CM | POA: Diagnosis present

## 2014-02-10 DIAGNOSIS — E876 Hypokalemia: Secondary | ICD-10-CM | POA: Diagnosis not present

## 2014-02-10 DIAGNOSIS — Z888 Allergy status to other drugs, medicaments and biological substances status: Secondary | ICD-10-CM

## 2014-02-10 DIAGNOSIS — K529 Noninfective gastroenteritis and colitis, unspecified: Secondary | ICD-10-CM | POA: Diagnosis present

## 2014-02-10 DIAGNOSIS — Z880 Allergy status to penicillin: Secondary | ICD-10-CM

## 2014-02-10 DIAGNOSIS — E782 Mixed hyperlipidemia: Secondary | ICD-10-CM | POA: Diagnosis present

## 2014-02-10 DIAGNOSIS — R197 Diarrhea, unspecified: Secondary | ICD-10-CM | POA: Insufficient documentation

## 2014-02-10 HISTORY — DX: Noninfective gastroenteritis and colitis, unspecified: K52.9

## 2014-02-10 LAB — CBC WITH DIFFERENTIAL/PLATELET
Band Neutrophils: 2 % (ref 0–10)
Basophils Absolute: 0 10*3/uL (ref 0.0–0.1)
Basophils Relative: 0 % (ref 0–1)
Blasts: 0 %
EOS PCT: 0 % (ref 0–5)
Eosinophils Absolute: 0 10*3/uL (ref 0.0–0.7)
HCT: 44.3 % (ref 36.0–46.0)
HEMOGLOBIN: 15.6 g/dL — AB (ref 12.0–15.0)
LYMPHS PCT: 9 % — AB (ref 12–46)
Lymphs Abs: 2.5 10*3/uL (ref 0.7–4.0)
MCH: 30.8 pg (ref 26.0–34.0)
MCHC: 35.2 g/dL (ref 30.0–36.0)
MCV: 87.4 fL (ref 78.0–100.0)
Metamyelocytes Relative: 1 %
Monocytes Absolute: 0.8 10*3/uL (ref 0.1–1.0)
Monocytes Relative: 3 % (ref 3–12)
Myelocytes: 0 %
NEUTROS ABS: 24.4 10*3/uL — AB (ref 1.7–7.7)
NEUTROS PCT: 85 % — AB (ref 43–77)
PROMYELOCYTES ABS: 0 %
Platelets: 384 10*3/uL (ref 150–400)
RBC: 5.07 MIL/uL (ref 3.87–5.11)
RDW: 14 % (ref 11.5–15.5)
WBC: 27.7 10*3/uL — ABNORMAL HIGH (ref 4.0–10.5)
nRBC: 0 /100 WBC

## 2014-02-10 LAB — COMPREHENSIVE METABOLIC PANEL
ALK PHOS: 105 U/L (ref 39–117)
ALT: 46 U/L — ABNORMAL HIGH (ref 0–35)
ANION GAP: 18 — AB (ref 5–15)
AST: 34 U/L (ref 0–37)
Albumin: 3.5 g/dL (ref 3.5–5.2)
BILIRUBIN TOTAL: 0.5 mg/dL (ref 0.3–1.2)
BUN: 12 mg/dL (ref 6–23)
CHLORIDE: 98 meq/L (ref 96–112)
CO2: 22 mEq/L (ref 19–32)
Calcium: 9.4 mg/dL (ref 8.4–10.5)
Creatinine, Ser: 0.7 mg/dL (ref 0.50–1.10)
GFR calc non Af Amer: 85 mL/min — ABNORMAL LOW (ref >90)
GLUCOSE: 187 mg/dL — AB (ref 70–99)
Potassium: 3.6 mEq/L — ABNORMAL LOW (ref 3.7–5.3)
Sodium: 138 mEq/L (ref 137–147)
Total Protein: 7.7 g/dL (ref 6.0–8.3)

## 2014-02-10 LAB — CLOSTRIDIUM DIFFICILE BY PCR: Toxigenic C. Difficile by PCR: NEGATIVE

## 2014-02-10 LAB — OCCULT BLOOD X 1 CARD TO LAB, STOOL: FECAL OCCULT BLD: POSITIVE — AB

## 2014-02-10 MED ORDER — IOHEXOL 300 MG/ML  SOLN
100.0000 mL | Freq: Once | INTRAMUSCULAR | Status: AC | PRN
  Administered 2014-02-10: 100 mL via INTRAVENOUS

## 2014-02-10 MED ORDER — METRONIDAZOLE IN NACL 5-0.79 MG/ML-% IV SOLN: 500.0000 mg | Freq: Four times a day (QID) | INTRAVENOUS | Status: DC

## 2014-02-10 MED ORDER — METRONIDAZOLE IN NACL 5-0.79 MG/ML-% IV SOLN
500.0000 mg | Freq: Once | INTRAVENOUS | Status: AC
  Administered 2014-02-10: 500 mg via INTRAVENOUS
  Filled 2014-02-10: qty 100

## 2014-02-10 MED ORDER — INFLUENZA VAC SPLIT QUAD 0.5 ML IM SUSY
0.5000 mL | PREFILLED_SYRINGE | INTRAMUSCULAR | Status: AC
  Administered 2014-02-11: 0.5 mL via INTRAMUSCULAR
  Filled 2014-02-10: qty 0.5

## 2014-02-10 MED ORDER — HYDROMORPHONE HCL 1 MG/ML IJ SOLN
1.0000 mg | INTRAMUSCULAR | Status: DC | PRN
  Administered 2014-02-10 (×3): 1 mg via INTRAVENOUS
  Administered 2014-02-11: 2 mg via INTRAVENOUS
  Administered 2014-02-11: 1 mg via INTRAVENOUS
  Administered 2014-02-11: 2 mg via INTRAVENOUS
  Administered 2014-02-11 (×4): 1 mg via INTRAVENOUS
  Administered 2014-02-11 – 2014-02-12 (×2): 2 mg via INTRAVENOUS
  Administered 2014-02-12 (×3): 1 mg via INTRAVENOUS
  Filled 2014-02-10 (×2): qty 1
  Filled 2014-02-10: qty 2
  Filled 2014-02-10 (×2): qty 1
  Filled 2014-02-10: qty 2
  Filled 2014-02-10: qty 1
  Filled 2014-02-10: qty 2
  Filled 2014-02-10 (×2): qty 1
  Filled 2014-02-10: qty 2
  Filled 2014-02-10 (×3): qty 1
  Filled 2014-02-10: qty 2

## 2014-02-10 MED ORDER — SODIUM CHLORIDE 0.9 % IV BOLUS (SEPSIS)
500.0000 mL | Freq: Once | INTRAVENOUS | Status: AC
  Administered 2014-02-10: 500 mL via INTRAVENOUS

## 2014-02-10 MED ORDER — IOHEXOL 300 MG/ML  SOLN
50.0000 mL | Freq: Once | INTRAMUSCULAR | Status: AC | PRN
  Administered 2014-02-10: 50 mL via ORAL

## 2014-02-10 MED ORDER — ONDANSETRON HCL 4 MG/2ML IJ SOLN
4.0000 mg | Freq: Once | INTRAMUSCULAR | Status: AC
  Administered 2014-02-10: 4 mg via INTRAVENOUS
  Filled 2014-02-10: qty 2

## 2014-02-10 MED ORDER — MORPHINE SULFATE 4 MG/ML IJ SOLN
4.0000 mg | Freq: Once | INTRAMUSCULAR | Status: AC
  Administered 2014-02-10: 4 mg via INTRAVENOUS
  Filled 2014-02-10: qty 1

## 2014-02-10 MED ORDER — ONDANSETRON HCL 4 MG/2ML IJ SOLN
4.0000 mg | Freq: Once | INTRAMUSCULAR | Status: AC
Start: 2014-02-10 — End: 2014-02-10
  Administered 2014-02-10: 4 mg via INTRAVENOUS
  Filled 2014-02-10: qty 2

## 2014-02-10 MED ORDER — CIPROFLOXACIN IN D5W 400 MG/200ML IV SOLN: 400.0000 mg | Freq: Once | INTRAVENOUS | Status: DC

## 2014-02-10 MED ORDER — PROMETHAZINE HCL 25 MG/ML IJ SOLN
12.5000 mg | Freq: Four times a day (QID) | INTRAMUSCULAR | Status: DC | PRN
  Administered 2014-02-10 – 2014-02-13 (×4): 12.5 mg via INTRAVENOUS
  Filled 2014-02-10 (×7): qty 1

## 2014-02-10 MED ORDER — AMLODIPINE BESYLATE 5 MG PO TABS
5.0000 mg | ORAL_TABLET | Freq: Every day | ORAL | Status: DC
  Administered 2014-02-12 – 2014-02-17 (×6): 5 mg via ORAL
  Filled 2014-02-10 (×9): qty 1

## 2014-02-10 NOTE — ED Notes (Signed)
[facility] rectal bleed for 4 days. Pt had 3 bloody BM. Pt presents with weak, pallor, and abdominal pain. Pt alert and oriented.

## 2014-02-10 NOTE — ED Notes (Signed)
Report Given to RobbinsJeff with [facility]

## 2014-02-10 NOTE — Progress Notes (Signed)
[facility] Dr[facility] Bebe ShaggyWickline. PCP Sandford CrazeMelissa O'Hendry Speas, NP. Bloody diarrhea and abd pain. Got cipro for UTI about a month ago. HD stable. WBC 27K. CT shows ascending and transverse colitis. Given flagyl in ED. [facility]  CALL FLOW MANAGER ON ARRIVAL TO UNIT [phone].  HOSPITALIST WILL BE PAGED BY FLOW MANAGER.  Crista Curborinna Alexandria Shiflett, MD

## 2014-02-10 NOTE — ED Notes (Signed)
MD at bedside. 

## 2014-02-10 NOTE — Patient Instructions (Signed)
Please proceed directly to the ED on the first floor. They are expecting you. Hope you feel better soon!

## 2014-02-10 NOTE — Assessment & Plan Note (Signed)
Pt is now having pure bloody BM's, + tachycardia, + abdominal tenderness.  Will refer to ED for further evaluation. Will need IV fluids, CT abd/pelvis, stat cbc to further evaluate. Suspect diverticulitis.  She has had diverticulitis in the past.   Report [facility]

## 2014-02-10 NOTE — Progress Notes (Signed)
Miranda HoughSylvia W Lake [name] [id number] Admission Data: 02/10/2014 4:45 PM Attending Provider: Rhetta MuraJai-Gurmukh Samtani, MD  PCP:O'SULLIVAN,[name] S., NP Consults/ Treatment Team:    Sabino DickSylvia W [name] is a 73 y.o. female [facility], alert  & orientated  X 3,  Full Code, VSS - Blood pressure 134/55, pulse 93, temperature 98.1 F (36.7 C), temperature source Oral, resp. rate 20, height 5\' 2"  (1.575 m), weight 80.151 kg (176 lb 11.2 oz), last menstrual period 05/14/1968, SpO2 95.00%., no c/o shortness of breath, no c/o chest pain, no distress noted.   IV site WDL: Left a/c.   Allergies:   Allergies  Allergen Reactions  . Atorvastatin Other (See Comments)    REACTION: MUSCLE PAINS  . Diclofenac-Misoprostol Diarrhea and Nausea And Vomiting  . Fenofibrate Other (See Comments)    REACTION: carpopedal spasms  . Rosuvastatin Other (See Comments)    REACTION: MUSCLE PAINS  . Statins Other (See Comments)    myalgias  . Amoxicillin-Pot Clavulanate Other (See Comments)    REACTION: unspecified  . Penicillins Hives  . Sulfonamide Derivatives Hives  . Tuberculin Ppd Other (See Comments)    unknown     Past Medical History  Diagnosis Date  . Hyperlipidemia   . Hypertension   . Osteoporosis     pt cannot tolerate fosamax  . Arthritis     osteoarthritis  . Barrett's esophagus   . GERD (gastroesophageal reflux disease)   . Chronic obstructive bronchitis     Dr Delford FieldWright  . IFG (impaired fasting glucose)   . Colon polyps   . History of nephrolithiasis   . History of chicken pox   . Positive PPD     exposure to 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 Positive PPD only, negative CXR.  [name]. History of transfusion of packed red blood cells   . Urinary incontinence   . Colitis       Pt orientation to unit, room and routine. Information packet given to patient/family and safety video watched.  Admission INP armband ID verified with patient/family, and in place. SR up x 2, fall risk assessment complete  with Patient and family verbalizing understanding of risks associated with falls. Pt verbalizes an understanding of how to use the call bell and to call for help before getting out of bed.  Skin, clean-dry- intact without evidence of bruising, or skin tears.   No evidence of skin break down noted on exam.     Will cont to monitor and assist as needed.  Kern ReapBrumagin, Miranda Glasser L, RN 02/10/2014 4:45 PM

## 2014-02-10 NOTE — ED Provider Notes (Signed)
CSN: [id number]     Arrival date & time 02/10/14  0915 History   First MD Initiated Contact with Patient 02/10/14 1008     Chief Complaint  Patient presents with  . GI Bleeding      Patient is a 73 y.o. female presenting with abdominal pain. The history is provided by the patient.  Abdominal Pain Pain location:  LLQ and RLQ Pain severity:  Moderate Onset quality:  Gradual Duration:  2 days Timing:  Constant Progression:  Worsening Chronicity:  New Relieved by:  Nothing Worsened by:  Movement and palpation Associated symptoms: chills, hematochezia and nausea   Associated symptoms: no chest pain   pt reports abdominal pain and bloody diarrhea She was seen by PCP and sent for further evaluation   Past Medical History  Diagnosis Date  . Hyperlipidemia   . Hypertension   . Osteoporosis     pt cannot tolerate fosamax  . Arthritis     osteoarthritis  . Barrett's esophagus   . GERD (gastroesophageal reflux disease)   . Chronic obstructive bronchitis     Dr Delford Field  . IFG (impaired fasting glucose)   . Colon polyps   . History of nephrolithiasis   . History of chicken pox   . Positive PPD     exposure to 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 Positive PPD only, negative CXR.  [name] History of transfusion of packed red blood cells   . Urinary incontinence    Past Surgical History  Procedure Laterality Date  . Abdominal hysterectomy  1970  . Appendectomy    . Cholecystectomy    . Total knee arthroplasty  04/2006    total left knee replacement  . Leg surgery  2008    right   Family History  Problem Relation Age of Onset  . Asthma Mother   . Coronary artery disease    . Hyperlipidemia    . Hypertension    . Arthritis    . Tuberculosis     History  Substance Use Topics  . Smoking status: Former Smoker -- 15 years    Quit date: 05/15/1967  . Smokeless tobacco: Never Used  . Alcohol Use: No   OB History   Grav Para Term Preterm Abortions TAB SAB Ect Mult Living                  Review of Systems  Constitutional: Positive for chills.  Cardiovascular: Negative for chest pain.  Gastrointestinal: Positive for nausea, abdominal pain, blood in stool and hematochezia.  Neurological: Positive for weakness.  All other systems reviewed and are negative.     Allergies  Amoxicillin-pot clavulanate; Atorvastatin; Diclofenac-misoprostol; Fenofibrate; Penicillins; Rosuvastatin; Statins; Sulfonamide derivatives; and Tuberculin ppd  Home Medications   Prior to Admission medications   Medication Sig Start Date End Date Taking? Authorizing Provider  amLODipine (NORVASC) 5 MG tablet Take 1 tablet (5 mg total) by mouth daily. 11/17/13   Sandford Craze, NP  Ascorbic Acid (VITAMIN C) 1000 MG tablet Take 1,000 mg by mouth daily.    Historical Provider, MD  aspirin 325 MG tablet Take 325 mg by mouth daily.      Historical Provider, MD  beta carotene 15 MG capsule Take 15 mg by mouth daily.      Historical Provider, MD  bifidobacterium infantis (ALIGN) capsule Take 1 capsule by mouth daily.      Historical Provider, MD  Calcium Carbonate-Vitamin D (CALTRATE 600+D) 600-400 MG-UNIT per tablet Take 1 tablet by mouth 2 (two)  times daily.    Historical Provider, MD  Coenzyme Q10 200 MG capsule Take 200 mg by mouth daily.      Historical Provider, MD  ezetimibe (ZETIA) 10 MG tablet Take 1 tablet (10 mg total) by mouth daily. 11/17/13   Sandford CrazeMelissa O'Sullivan, NP  fluticasone (FLONASE) 50 MCG/ACT nasal spray Place 2 sprays into both nostrils daily as needed. Each nostril. 06/08/13   Sandford CrazeMelissa O'Sullivan, NP  ibuprofen (ADVIL,MOTRIN) 200 MG tablet Take 200 mg by mouth. Take 1 tablet as needed for pain.     Historical Provider, MD  Magnesium 500 MG TABS Take 1 tablet by mouth daily.      Historical Provider, MD  Multiple Vitamin (MULTIVITAMIN) tablet Take 1 tablet by mouth daily.      Historical Provider, MD  niacin 500 MG tablet 3 tabs by mouth at bedtime 08/27/12   Sandford CrazeMelissa O'Sullivan, NP  Omega-3  Fatty Acids (FISH OIL) 1000 MG CAPS Take 2 capsules by mouth 2 (two) times daily.      Historical Provider, MD  omeprazole (PRILOSEC) 20 MG capsule Take 1 capsule (20 mg total) by mouth 2 (two) times daily. 11/17/13   Sandford CrazeMelissa O'Sullivan, NP  potassium chloride (MICRO-K) 10 MEQ CR capsule Take 1 capsule (10 mEq total) by mouth daily. 11/17/13   Sandford CrazeMelissa O'Sullivan, NP  quinapril-hydrochlorothiazide (ACCURETIC) 20-25 MG per tablet Take 1 tablet by mouth daily. 11/17/13   Sandford CrazeMelissa O'Sullivan, NP  vitamin E (VITAMIN E) 400 UNIT capsule Take 400 Units by mouth daily.      Historical Provider, MD   BP 154/76  Pulse 101  Temp(Src) 98.6 F (37 C) (Oral)  Resp 20  SpO2 95%  LMP 05/14/1968 Physical Exam CONSTITUTIONAL: Well developed/well nourished HEAD: Normocephalic/atraumatic EYES: EOMI/PERRL ENMT: Mucous membranes moist NECK: supple no meningeal signs SPINE:entire spine nontender CV: S1/S2 noted, no murmurs/rubs/gallops noted LUNGS: Lungs are clear to auscultation bilaterally, no apparent distress ABDOMEN: soft, moderate RLQ/LLQ tenderness noted, no rebound or guarding GU:no cva tenderness NEURO: Pt is awake/alert, moves all extremitiesx4 EXTREMITIES: pulses normal, full ROM SKIN: warm, color normal PSYCH: no abnormalities of mood noted   ED Course  Procedures  10:44 AM Pt will require CT imaging Concern for acute diverticulitis Pt agreeable with plan Rectal exam per nursing - pt with bloody stool/hemorrhoids 12:18 PM CT imaging c/w infectious colitis Pt clinically stable at this time D/w dr Halford Chessmansulivan, will admit Requests only IV flagyl at this time D/w patient, agreeable with transfer Labs Review Labs Reviewed  CBC WITH DIFFERENTIAL - Abnormal; Notable for the following:    WBC 27.7 (*)    Hemoglobin 15.6 (*)    Neutrophils Relative % 85 (*)    Lymphocytes Relative 9 (*)    Neutro Abs 24.4 (*)    All other components within normal limits  COMPREHENSIVE METABOLIC PANEL - Abnormal;  Notable for the following:    Potassium 3.6 (*)    Glucose, Bld 187 (*)    ALT 46 (*)    GFR calc non Af Amer 85 (*)    Anion gap 18 (*)    All other components within normal limits  OCCULT BLOOD X 1 CARD TO LAB, STOOL - Abnormal; Notable for the following:    Fecal Occult Bld POSITIVE (*)    All other components within normal limits  CLOSTRIDIUM DIFFICILE BY PCR    Imaging Review Ct Abdomen Pelvis W Contrast  02/10/2014   CLINICAL DATA:  Abdominal pain with right lower quadrant tenderness. Nausea.  Diarrhea for 3 days. Hysterectomy. Appendectomy. Cholecystectomy.  EXAM: CT ABDOMEN AND PELVIS WITH CONTRAST  TECHNIQUE: Multidetector CT imaging of the abdomen and pelvis was performed using the standard protocol following bolus administration of intravenous contrast.  CONTRAST:  50mL OMNIPAQUE IOHEXOL 300 MG/ML SOLN, OMNIPAQUE IOHEXOL 300 MG/ML SOLN  COMPARISON:  10/15/2011  FINDINGS: Lower chest: Clear lung bases. Mild cardiomegaly with coronary artery atherosclerosis. A small hiatal hernia with contrast filled lower thoracic esophagus.  Hepatobiliary: Moderate hepatic steatosis, without focal liver lesion. Cholecystectomy, without biliary ductal dilatation.  Pancreas: Low-density in the pancreatic body on image 27 is secondary to interdigitation of peripancreatic fat.  Spleen: Normal  Adrenals/Urinary Tract: Scarring or a too small to characterize lesion in the interpolar left kidney. Normal right kidney. Normal adrenal glands. Normal urinary bladder.  Stomach/Bowel: Normal distal stomach. Sigmoid diverticulosis. Moderate wall thickening of and edema surrounding the ascending and transverse colon. No pneumatosis or free intraperitoneal air. Normal terminal ileum. Appendectomy.  Normal small bowel loops.  Vascular/Lymphatic: Moderate aortic and branch vessel atherosclerosis. No evidence of mesenteric embolism. The inferior mesenteric artery is patent proximally. No retroperitoneal or retrocrural  adenopathy. Small ileocolic mesenteric nodes are likely reactive. No pelvic adenopathy.  Reproductive: Hysterectomy. Mild pelvic floor laxity. No adnexal mass. Trace abdominal and small volume pelvic cul-de-sac fluid. No collection to suggest abscess.  Other:  None  Musculoskeletal: Right hip arthroplasty. Degenerative disc disease, including at L3 through S1.  IMPRESSION: 1. Extensive colitis. Distribution favors infection. Recommend clinical exclusion of C difficile colitis. Ischemia and inflammatory bowel disease felt less likely, given distribution. 2. Small volume abdominal pelvic ascites, presumably secondary. 3.  Atherosclerosis, including within the coronary arteries. 4. Small hiatal hernia. Esophageal air fluid level suggests dysmotility or gastroesophageal reflux. 5. Mild hepatic steatosis.   Electronically Signed   By: Jeronimo Greaves M.D.   On: 02/10/2014 11:52     MDM   Final diagnoses:  Infectious colitis    Nursing notes including past medical history and social history reviewed and considered in documentation Labs/vital reviewed and considered Previous records reviewed and considered     Joya Gaskins, MD 02/10/14 1219

## 2014-02-10 NOTE — ED Notes (Signed)
Report given to Orpha BurKaty, Charity fundraiserN [facility] [place]

## 2014-02-10 NOTE — Progress Notes (Signed)
Subjective:    Patient ID: [name], female    DOB: 04/29/1941, 73 y.o.   MRN: [id number]  HPI  [name] is a 73 yr old female who presents today with chief complaint of diarrhea.  Diarrhea started 4 days ago. Yesterday symptoms "really got bad and stomach was killing me." She reports that she still has abdominal pain.  Today she has been to the bathroom 4x an hour, passing liquid blood only.  She denies associated fever.  She reports + nausea without vomiting.  Has not taken any solids since yesterday AM.  She is not tolerating liquids due to nausea.    Review of Systems See HPI  Past Medical History  Diagnosis Date  . Hyperlipidemia   . Hypertension   . Osteoporosis     pt cannot tolerate fosamax  . Arthritis     osteoarthritis  . Barrett's esophagus   . GERD (gastroesophageal reflux disease)   . Chronic obstructive bronchitis     Dr Delford Field  . IFG (impaired fasting glucose)   . Colon polyps   . History of nephrolithiasis   . History of chicken pox   . Positive PPD     exposure to 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 Positive PPD only, negative CXR.  [name] History of transfusion of packed red blood cells   . Urinary incontinence     History   Social History  . Marital Status: Widowed    Spouse Name: N/A    Number of Children: N/A  . Years of Education: N/A   Occupational History  . Retired    Social History Main Topics  . Smoking status: Former Smoker -- 15 years    Quit date: 05/15/1967  . Smokeless tobacco: Never Used  . Alcohol Use: No  . Drug Use: No  . Sexual Activity: Not on file   Other Topics Concern  . Not on file   Social History Narrative   Regular exercise: yes    Past Surgical History  Procedure Laterality Date  . Abdominal hysterectomy  1970  . Appendectomy    . Cholecystectomy    . Total knee arthroplasty  04/2006    total left knee replacement  . Leg surgery  2008    right    Family History  Problem Relation Age of Onset  . Asthma  Mother   . Coronary artery disease    . Hyperlipidemia    . Hypertension    . Arthritis    . Tuberculosis      Allergies  Allergen Reactions  . Amoxicillin-Pot Clavulanate     REACTION: unspecified  . Atorvastatin     REACTION: MUSCLE PAINS  . Diclofenac-Misoprostol     REACTION: NVD  . Fenofibrate     REACTION: carpopedal spasms  . Penicillins     REACTION: welps  . Rosuvastatin     REACTION: MUSCLE PAINS  . Statins   . Sulfonamide Derivatives   . Tuberculin Ppd     Current Outpatient Prescriptions on File Prior to Visit  Medication Sig Dispense Refill  . amLODipine (NORVASC) 5 MG tablet Take 1 tablet (5 mg total) by mouth daily.  90 tablet  1  . Ascorbic Acid (VITAMIN C) 1000 MG tablet Take 1,000 mg by mouth daily.      [name] aspirin 325 MG tablet Take 325 mg by mouth daily.        . beta carotene 15 MG capsule Take 15 mg by mouth daily.        [name]  bifidobacterium infantis (ALIGN) capsule Take 1 capsule by mouth daily.        . Calcium Carbonate-Vitamin D (CALTRATE 600+D) 600-400 MG-UNIT per tablet Take 1 tablet by mouth 2 (two) times daily.      . ciprofloxacin (CIPRO) 250 MG tablet Take 1 tablet (250 mg total) by mouth 2 (two) times daily.  14 tablet  0  . Coenzyme Q10 200 MG capsule Take 200 mg by mouth daily.        [name]. ezetimibe (ZETIA) 10 MG tablet Take 1 tablet (10 mg total) by mouth daily.  90 tablet  1  . fluticasone (FLONASE) 50 MCG/ACT nasal spray Place 2 sprays into both nostrils daily as needed. Each nostril.  1 g  5  . ibuprofen (ADVIL,MOTRIN) 200 MG tablet Take 200 mg by mouth. Take 1 tablet as needed for pain.       . Magnesium 500 MG TABS Take 1 tablet by mouth daily.        . Multiple Vitamin (MULTIVITAMIN) tablet Take 1 tablet by mouth daily.        . niacin 500 MG tablet 3 tabs by mouth at bedtime      . Omega-3 Fatty Acids (FISH OIL) 1000 MG CAPS Take 2 capsules by mouth 2 (two) times daily.        [name]. omeprazole (PRILOSEC) 20 MG capsule Take 1 capsule (20 mg  total) by mouth 2 (two) times daily.  180 capsule  1  . potassium chloride (MICRO-K) 10 MEQ CR capsule Take 1 capsule (10 mEq total) by mouth daily.  90 capsule  1  . quinapril-hydrochlorothiazide (ACCURETIC) 20-25 MG per tablet Take 1 tablet by mouth daily.  90 tablet  1  . vitamin E (VITAMIN E) 400 UNIT capsule Take 400 Units by mouth daily.         No current facility-administered medications on file prior to visit.    BP 150/72  Pulse 114  Temp(Src) 98.5 F (36.9 C) (Oral)  Resp 18  Ht 5\' 2"  (1.575 m)  Wt 173 lb 3.2 oz (78.563 kg)  BMI 31.67 kg/m2  SpO2 97%  LMP 05/14/1968       Objective:   Physical Exam  Constitutional: She is oriented to person, place, and time. She appears well-developed and well-nourished.  Pale, weak appearing  Cardiovascular: Regular rhythm.  Tachycardia present.   No murmur heard. Abdominal: Soft. There is tenderness in the right upper quadrant. There is guarding.  Musculoskeletal: She exhibits no edema.  Neurological: She is alert and oriented to person, place, and time.  Psychiatric: She has a normal mood and affect. Her behavior is normal. Judgment and thought content normal.          Assessment & Plan:

## 2014-02-10 NOTE — H&P (Signed)
[facility] History and Physical  [name] VWU:[id number] DOB: 06/24/1940 DOA: 02/10/2014  Referring physician: ED PCP: Lemont Fillers., NP  Specialists: none  Chief Complaint: bloody stool  HPI:  73 y/o h/o R subtroch Hip #, L knee arthroplasty,  htn, gerd, osteoporosis, HLd, prior diverticulitis with a known history possible cystitis depression atrophy treated with 7 days of ciprofloxacin. Present to her primary care physician's office today with three-day history of abdominal pain diarrhea. Diarrhea started on this (02/07/14 and was formed stool initially and then subsequently became more and more watery loose progressed until this morning been frank blood. Patient became concerned and went care physician's office and probably sent to the emergency room. She has 9/10 abdominal pain crampy in nature lower quadrant right >left. It was relieved by some morphine but that does make her uncomfortable and nauseous. She has no vomiting but has had nausea over the past day. Her last meal was yesterday morning breakfast and she has no appetite at present time but she has been drinking liquids . Emergency room workup revealed fecal occult positive, CT abdomen pelvis = extensive colitis distally she favors infection small hiatal hernia mild hepatic steatosis Sodium 138 potassium 3.6 BUN 12 creatinine 0.7 WBC 27 hemoglobin 15.6 hematocrit 44.3 C. difficile is pending    Review of Systems: The patient denies  Chest pain Blurred vision Cough Cold Fever Unilateral weakness Diplopia Fall Ataxia Seizure Rash Dysuria   Past Medical History  Diagnosis Date  . Hyperlipidemia   . Hypertension   . Osteoporosis     pt cannot tolerate fosamax  . Arthritis     osteoarthritis  . Barrett's esophagus   . GERD (gastroesophageal reflux disease)   . Chronic obstructive bronchitis     Dr Delford Field  . IFG (impaired fasting glucose)   . Colon polyps   . History of nephrolithiasis    . History of chicken pox   . Positive PPD     exposure to 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 Positive PPD only, negative CXR.  [name] History of transfusion of packed red blood cells   . Urinary incontinence    Past Surgical History  Procedure Laterality Date  . Abdominal hysterectomy  1970  . Appendectomy    . Cholecystectomy    . Total knee arthroplasty  04/2006    total left knee replacement  . Leg surgery  2008    right   Social History:  History   Social History Narrative   Regular exercise: yes    Allergies  Allergen Reactions  . Atorvastatin Other (See Comments)    REACTION: MUSCLE PAINS  . Diclofenac-Misoprostol     REACTION: NVD  . Fenofibrate     REACTION: carpopedal spasms  . Penicillins     REACTION: welps  . Rosuvastatin     REACTION: MUSCLE PAINS  . Statins   . Sulfonamide Derivatives   . Tuberculin Ppd   . Amoxicillin-Pot Clavulanate Other (See Comments)    REACTION: unspecified    Family History  Problem Relation Age of Onset  . Asthma Mother   . Coronary artery disease    . Hyperlipidemia    . Hypertension    . Arthritis    . Tuberculosis       Prior to Admission medications   Medication Sig Start Date End Date Taking? Authorizing Provider  amLODipine (NORVASC) 5 MG tablet Take 1 tablet (5 mg total) by mouth daily. 11/17/13   Sandford Craze, NP  Ascorbic Acid (VITAMIN C)  1000 MG tablet Take 1,000 mg by mouth daily.    Historical Provider, MD  aspirin 325 MG tablet Take 325 mg by mouth daily.      Historical Provider, MD  beta carotene 15 MG capsule Take 15 mg by mouth daily.      Historical Provider, MD  bifidobacterium infantis (ALIGN) capsule Take 1 capsule by mouth daily.      Historical Provider, MD  Calcium Carbonate-Vitamin D (CALTRATE 600+D) 600-400 MG-UNIT per tablet Take 1 tablet by mouth 2 (two) times daily.    Historical Provider, MD  Coenzyme Q10 200 MG capsule Take 200 mg by mouth daily.      Historical Provider, MD  ezetimibe (ZETIA) 10 MG  tablet Take 1 tablet (10 mg total) by mouth daily. 11/17/13   Sandford Craze, NP  fluticasone (FLONASE) 50 MCG/ACT nasal spray Place 2 sprays into both nostrils daily as needed. Each nostril. 06/08/13   Sandford Craze, NP  ibuprofen (ADVIL,MOTRIN) 200 MG tablet Take 200 mg by mouth. Take 1 tablet as needed for pain.     Historical Provider, MD  Magnesium 500 MG TABS Take 1 tablet by mouth daily.      Historical Provider, MD  Multiple Vitamin (MULTIVITAMIN) tablet Take 1 tablet by mouth daily.      Historical Provider, MD  niacin 500 MG tablet 3 tabs by mouth at bedtime 08/27/12   Sandford Craze, NP  Omega-3 Fatty Acids (FISH OIL) 1000 MG CAPS Take 2 capsules by mouth 2 (two) times daily.      Historical Provider, MD  omeprazole (PRILOSEC) 20 MG capsule Take 1 capsule (20 mg total) by mouth 2 (two) times daily. 11/17/13   Sandford Craze, NP  potassium chloride (MICRO-K) 10 MEQ CR capsule Take 1 capsule (10 mEq total) by mouth daily. 11/17/13   Sandford Craze, NP  quinapril-hydrochlorothiazide (ACCURETIC) 20-25 MG per tablet Take 1 tablet by mouth daily. 11/17/13   Sandford Craze, NP  vitamin E (VITAMIN E) 400 UNIT capsule Take 400 Units by mouth daily.      Historical Provider, MD   Physical Exam: Filed Vitals:   02/10/14 1330 02/10/14 1350 02/10/14 1404 02/10/14 1527  BP: 147/74 147/74 141/69 134/55  Pulse: 99  95 93  Temp: 98.9 F (37.2 C) 98.9 F (37.2 C) 98.2 F (36.8 C) 98.1 F (36.7 C)  TempSrc: Oral  Oral Oral  Resp: 17  18 20   Height:    5\' 2"  (1.575 m)  Weight:    80.151 kg (176 lb 11.2 oz)  SpO2: 96% 96% 94% 95%     General:  Alert doesn't uncomfortable appearing Caucasian female  Eyes:  EOMI NCAT PERRLA  ENT:  Dry mucous membranes  Neck:  Soft supple  Cardiovascular:  S1-S2 slightly tachycardic  Respiratory:  Clinically clear no added sound  Abdomen:  Soft tender in lower quadrant no rebound bowel sounds heard no guarding  Skin:  No lower extremity  edema  Musculoskeletal:  Range of motion intact  Psychiatric:  Pleasant euthymic  Neurologic:  Grossly intact 5/5 power reflexes 2/3, finger-nose-finger normal  Labs on Admission:  Basic Metabolic Panel:  Recent Labs Lab 02/10/14 0935  NA 138  K 3.6*  CL 98  CO2 22  GLUCOSE 187*  BUN 12  CREATININE 0.70  CALCIUM 9.4   Liver Function Tests:  Recent Labs Lab 02/10/14 0935  AST 34  ALT 46*  ALKPHOS 105  BILITOT 0.5  PROT 7.7  ALBUMIN 3.5  No results found for this basename: LIPASE, AMYLASE,  in the last 168 hours No results found for this basename: AMMONIA,  in the last 168 hours CBC:  Recent Labs Lab 02/10/14 0935  WBC 27.7*  NEUTROABS 24.4*  HGB 15.6*  HCT 44.3  MCV 87.4  PLT 384   Cardiac Enzymes: No results found for this basename: CKTOTAL, CKMB, CKMBINDEX, TROPONINI,  in the last 168 hours  BNP (last 3 results) No results found for this basename: PROBNP,  in the last 8760 hours CBG: No results found for this basename: GLUCAP,  in the last 168 hours  Radiological Exams on Admission: Ct Abdomen Pelvis W Contrast  02/10/2014   CLINICAL DATA:  Abdominal pain with right lower quadrant tenderness. Nausea. Diarrhea for 3 days. Hysterectomy. Appendectomy. Cholecystectomy.  EXAM: CT ABDOMEN AND PELVIS WITH CONTRAST  TECHNIQUE: Multidetector CT imaging of the abdomen and pelvis was performed using the standard protocol following bolus administration of intravenous contrast.  CONTRAST:  50mL OMNIPAQUE IOHEXOL 300 MG/ML SOLN, OMNIPAQUE IOHEXOL 300 MG/ML SOLN  COMPARISON:  10/15/2011  FINDINGS: Lower chest: Clear lung bases. Mild cardiomegaly with coronary artery atherosclerosis. A small hiatal hernia with contrast filled lower thoracic esophagus.  Hepatobiliary: Moderate hepatic steatosis, without focal liver lesion. Cholecystectomy, without biliary ductal dilatation.  Pancreas: Low-density in the pancreatic body on image 27 is secondary to interdigitation of  peripancreatic fat.  Spleen: Normal  Adrenals/Urinary Tract: Scarring or a too small to characterize lesion in the interpolar left kidney. Normal right kidney. Normal adrenal glands. Normal urinary bladder.  Stomach/Bowel: Normal distal stomach. Sigmoid diverticulosis. Moderate wall thickening of and edema surrounding the ascending and transverse colon. No pneumatosis or free intraperitoneal air. Normal terminal ileum. Appendectomy.  Normal small bowel loops.  Vascular/Lymphatic: Moderate aortic and branch vessel atherosclerosis. No evidence of mesenteric embolism. The inferior mesenteric artery is patent proximally. No retroperitoneal or retrocrural adenopathy. Small ileocolic mesenteric nodes are likely reactive. No pelvic adenopathy.  Reproductive: Hysterectomy. Mild pelvic floor laxity. No adnexal mass. Trace abdominal and small volume pelvic cul-de-sac fluid. No collection to suggest abscess.  Other:  None  Musculoskeletal: Right hip arthroplasty. Degenerative disc disease, including at L3 through S1.  IMPRESSION: 1. Extensive colitis. Distribution favors infection. Recommend clinical exclusion of C difficile colitis. Ischemia and inflammatory bowel disease felt less likely, given distribution. 2. Small volume abdominal pelvic ascites, presumably secondary. 3.  Atherosclerosis, including within the coronary arteries. 4. Small hiatal hernia. Esophageal air fluid level suggests dysmotility or gastroesophageal reflux. 5. Mild hepatic steatosis.   Electronically Signed   By: Jeronimo Greaves M.D.   On: 02/10/2014 11:52    EKG: Independently reviewed.  None  Assessment/Plan Principal Problem:   Colitis-DDX C. difficile colitis given risk factors PPI/recent antibiotics vs. diverticulitis. Treatment would be the same in terms of antibiotics such as Flagyl vs. Vancomycin.  If C. difficile panel comes back positive will transition from Flagyl 500 3 times a day to vancomycin. Her presentation is very unlikely being  congruent with upper GI bleed although she does take NSAIDs Continue IV fluid 100 cc per hour. Clear liquid diet. Antiemetic. Pain control with Dilaudid IV. Active Problems:   HYPERCHOLESTEROLEMIA-in acute sickness is hold off of Zetia niacin   HYPERTENSION-continue amlodipine 5 daily, hold Accuretic for now   GERD-discontinue omeprazole and calcium carbonate   BARRETTS ESOPHAGUS-will need screening endoscopy unclear if this is ever been done.   OSTEOPOROSIS-hold off on calcium supplementation at present   URINARY INCONTINENCE-stable  Impaired fasting glucose-last A1c was 5.9 so does not need any insulin   Full code  discuss with daughter at bedside 35 minutes   Mahala MenghiniSAMTANI, [facility] Le[facility] SummitJAI-GURMUKH [facility] Pager [phone]  If 7PM-7AM, please contact night-coverage [email or web address] Password [email or web address] 02/10/2014, 3:49 PM

## 2014-02-11 DIAGNOSIS — A09 Infectious gastroenteritis and colitis, unspecified: Secondary | ICD-10-CM

## 2014-02-11 DIAGNOSIS — A044 Other intestinal Escherichia coli infections: Secondary | ICD-10-CM | POA: Diagnosis not present

## 2014-02-11 LAB — COMPREHENSIVE METABOLIC PANEL
ALBUMIN: 2.7 g/dL — AB (ref 3.5–5.2)
ALT: 45 U/L — ABNORMAL HIGH (ref 0–35)
ANION GAP: 13 (ref 5–15)
AST: 32 U/L (ref 0–37)
Alkaline Phosphatase: 124 U/L — ABNORMAL HIGH (ref 39–117)
BILIRUBIN TOTAL: 0.4 mg/dL (ref 0.3–1.2)
BUN: 15 mg/dL (ref 6–23)
CHLORIDE: 100 meq/L (ref 96–112)
CO2: 24 meq/L (ref 19–32)
CREATININE: 0.71 mg/dL (ref 0.50–1.10)
Calcium: 8.5 mg/dL (ref 8.4–10.5)
GFR, EST NON AFRICAN AMERICAN: 84 mL/min — AB (ref >90)
Glucose, Bld: 126 mg/dL — ABNORMAL HIGH (ref 70–99)
Potassium: 3.5 mEq/L — ABNORMAL LOW (ref 3.7–5.3)
Sodium: 137 mEq/L (ref 137–147)
Total Protein: 6.3 g/dL (ref 6.0–8.3)

## 2014-02-11 LAB — PROTIME-INR
INR: 1.07 (ref 0.00–1.49)
PROTHROMBIN TIME: 13.9 s (ref 11.6–15.2)

## 2014-02-11 LAB — CBC
HCT: 44.6 % (ref 36.0–46.0)
Hemoglobin: 15.1 g/dL — ABNORMAL HIGH (ref 12.0–15.0)
MCH: 30.4 pg (ref 26.0–34.0)
MCHC: 33.9 g/dL (ref 30.0–36.0)
MCV: 89.9 fL (ref 78.0–100.0)
PLATELETS: 368 10*3/uL (ref 150–400)
RBC: 4.96 MIL/uL (ref 3.87–5.11)
RDW: 14.2 % (ref 11.5–15.5)
WBC: 29 10*3/uL — AB (ref 4.0–10.5)

## 2014-02-11 LAB — CLOSTRIDIUM DIFFICILE BY PCR: Toxigenic C. Difficile by PCR: NEGATIVE

## 2014-02-11 MED ORDER — CALCIUM CARBONATE-VITAMIN D 500-200 MG-UNIT PO TABS
1.0000 | ORAL_TABLET | Freq: Two times a day (BID) | ORAL | Status: DC
  Administered 2014-02-11 – 2014-02-17 (×12): 1 via ORAL
  Filled 2014-02-11 (×13): qty 1

## 2014-02-11 MED ORDER — CIPROFLOXACIN IN D5W 400 MG/200ML IV SOLN
400.0000 mg | Freq: Two times a day (BID) | INTRAVENOUS | Status: DC
  Administered 2014-02-11 – 2014-02-15 (×9): 400 mg via INTRAVENOUS
  Filled 2014-02-11 (×9): qty 200

## 2014-02-11 MED ORDER — METRONIDAZOLE IN NACL 5-0.79 MG/ML-% IV SOLN
500.0000 mg | Freq: Three times a day (TID) | INTRAVENOUS | Status: DC
  Administered 2014-02-11 – 2014-02-15 (×12): 500 mg via INTRAVENOUS
  Filled 2014-02-11 (×14): qty 100

## 2014-02-11 MED ORDER — PANTOPRAZOLE SODIUM 40 MG PO TBEC
40.0000 mg | DELAYED_RELEASE_TABLET | Freq: Every day | ORAL | Status: DC
  Administered 2014-02-11 – 2014-02-15 (×5): 40 mg via ORAL
  Filled 2014-02-11 (×3): qty 1

## 2014-02-11 MED ORDER — FAMOTIDINE 20 MG PO TABS
20.0000 mg | ORAL_TABLET | Freq: Two times a day (BID) | ORAL | Status: DC
  Administered 2014-02-11 – 2014-02-17 (×12): 20 mg via ORAL
  Filled 2014-02-11 (×13): qty 1

## 2014-02-11 MED ORDER — CALCIUM CARBONATE-VITAMIN D 600-400 MG-UNIT PO TABS
1.0000 | ORAL_TABLET | Freq: Two times a day (BID) | ORAL | Status: DC
  Filled 2014-02-11 (×3): qty 1

## 2014-02-11 NOTE — Progress Notes (Signed)
ANTIBIOTIC CONSULT NOTE - INITIAL  Pharmacy Consult for Cipro Indication: colitis  Allergies  Allergen Reactions  . Atorvastatin Other (See Comments)    REACTION: MUSCLE PAINS  . Diclofenac-Misoprostol Diarrhea and Nausea And Vomiting  . Fenofibrate Other (See Comments)    REACTION: carpopedal spasms  . Rosuvastatin Other (See Comments)    REACTION: MUSCLE PAINS  . Statins Other (See Comments)    myalgias  . Amoxicillin-Pot Clavulanate Other (See Comments)    REACTION: unspecified  . Penicillins Hives  . Sulfonamide Derivatives Hives  . Tuberculin Ppd Other (See Comments)    unknown    Patient Measurements: Height: 5\' 2"  (157.5 cm) Weight: 176 lb 11.2 oz (80.151 kg) IBW/kg (Calculated) : 50.1  Vital Signs: Temp: 98.1 F (36.7 C) (10/01 1100) Temp src: Oral (10/01 1100) BP: 124/58 mmHg (10/01 1100) Pulse Rate: 98 (10/01 1100) Intake/Output from previous day: 09/30 0701 - 10/01 0700 In: -  Out: 2 [Stool:2] Intake/Output from this shift:    Labs:  Recent Labs  02/10/14 0935 02/11/14 0737  WBC 27.7* 29.0*  HGB 15.6* 15.1*  PLT 384 368  CREATININE 0.70 0.71   Estimated Creatinine Clearance: 62.3 ml/min (by C-G formula based on Cr of 0.71). No results found for this basename: VANCOTROUGH, Leodis BinetVANCOPEAK, VANCORANDOM, GENTTROUGH, GENTPEAK, GENTRANDOM, TOBRATROUGH, TOBRAPEAK, TOBRARND, AMIKACINPEAK, AMIKACINTROU, AMIKACIN,  in the last 72 hours   Microbiology: Recent Results (from the past 720 hour(s))  CLOSTRIDIUM DIFFICILE BY PCR     Status: None   Collection Time    02/10/14  1:35 PM      Result Value Ref Range Status   C difficile by pcr NEGATIVE  NEGATIVE Final   Comment: [facility]formed [facility] [place]  CLOSTRIDIUM DIFFICILE BY PCR     Status: None   Collection Time    02/10/14  9:10 PM      Result Value Ref Range Status   C difficile by pcr NEGATIVE  NEGATIVE Final    Medical History: Past Medical History  Diagnosis Date  . Hyperlipidemia   .  Hypertension   . Osteoporosis     pt cannot tolerate fosamax  . Arthritis     osteoarthritis  . Barrett's esophagus   . GERD (gastroesophageal reflux disease)   . Chronic obstructive bronchitis     Dr Delford FieldWright  . IFG (impaired fasting glucose)   . Colon polyps   . History of nephrolithiasis   . History of chicken pox   . Positive PPD     exposure to 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 Positive PPD only, negative CXR.  [name]. History of transfusion of packed red blood cells   . Urinary incontinence   . Colitis     Medications:  Prescriptions prior to admission  Medication Sig Dispense Refill  . amLODipine (NORVASC) 5 MG tablet Take 1 tablet (5 mg total) by mouth daily.  90 tablet  1  . Ascorbic Acid (VITAMIN C) 1000 MG tablet Take 1,000 mg by mouth daily.      [name]. aspirin 325 MG tablet Take 325 mg by mouth daily.        . beta carotene 15 MG capsule Take 15 mg by mouth daily.        . bifidobacterium infantis (ALIGN) capsule Take 1 capsule by mouth daily.        . Calcium Carbonate-Vitamin D (CALTRATE 600+D) 600-400 MG-UNIT per tablet Take 1 tablet by mouth 2 (two) times daily.      . carboxymethylcellulose (REFRESH PLUS)  0.5 % SOLN Place 1 drop into both eyes 2 (two) times daily.      . Coenzyme Q10 200 MG capsule Take 200 mg by mouth daily.        [name] ezetimibe (ZETIA) 10 MG tablet Take 1 tablet (10 mg total) by mouth daily.  90 tablet  1  . fluticasone (FLONASE) 50 MCG/ACT nasal spray Place 2 sprays into both nostrils daily as needed. Each nostril.  1 g  5  . ibuprofen (ADVIL,MOTRIN) 200 MG tablet Take 200 mg by mouth 3 (three) times daily.       [name] loperamide (IMODIUM A-D) 2 MG tablet Take 2 mg by mouth as needed for diarrhea or loose stools.       . Magnesium 500 MG TABS Take 1 tablet by mouth daily.        . Multiple Vitamin (MULTIVITAMIN) tablet Take 1 tablet by mouth daily.        . niacin 500 MG tablet Take 1,500 mg by mouth at bedtime.      . Omega-3 Fatty Acids (FISH OIL) 1000 MG CAPS Take 1  capsule by mouth every morning.       [name] omeprazole (PRILOSEC) 20 MG capsule Take 20 mg by mouth See admin instructions. Takes 1 cap daily and then 1 cap as needed      . potassium chloride (MICRO-K) 10 MEQ CR capsule Take 1 capsule (10 mEq total) by mouth daily.  90 capsule  1  . quinapril-hydrochlorothiazide (ACCURETIC) 20-25 MG per tablet Take 1 tablet by mouth daily.  90 tablet  1  . vitamin E (VITAMIN E) 400 UNIT capsule Take 400 Units by mouth daily.         Assessment: 73 y/o female who presented to the ED 9/30 with abdominal pain and rectal bleeding/bloody BMs.  CT shows colitis. Pharmacy consulted to begin Cipro. Renal function is normal.  Goal of Therapy:  Eradication of infection  Plan:  - Cipro 400 mg IV q12h - Pharmacy signing off but will adjust dose if renal function worsens  [facility], [address].D., BCPS Clinical Pharmacist Pager: [phone] 02/11/2014 12:39 PM

## 2014-02-11 NOTE — Progress Notes (Signed)
Note: This document was prepared with digital dictation and possible smart phrase technology. Any transcriptional errors that result from this process are unintentional.   Miranda DickSylvia W [name] ZOX:[id number] DOB: 06/29/1940 DOA: 02/10/2014 PCP: Lemont Fillers'SULLIVAN,[name] S., NP  Brief narrative: HPI:  73 y/o h/o R subtroch Hip #, L knee arthroplasty, htn, gerd, osteoporosis, HLd, prior diverticulitis with a known history possible cystitis depression atrophy treated with 7 days of ciprofloxacin. Present to her primary care physician's office today with three-day history of abdominal pain diarrhea which became [facility] showed colitis, Cdiff neg   Past medical history-As per Problem list Chart reviewed as below-   Consultants:   Procedures:  CT  Antibiotics:   Flagyl 10/1  Cipro 10/1   Subjective  Alert oritned pain 5/10 No n/v Still some dark satool   Objective    Interim History:   Telemetry: non   Objective: Filed Vitals:   02/10/14 1404 02/10/14 1527 02/10/14 2124 02/11/14 0600  BP: 141/69 134/55 131/66 134/68  Pulse: 95 93 99 98  Temp: 98.2 F (36.8 C) 98.1 F (36.7 C) 99.2 F (37.3 C) 98.2 F (36.8 C)  TempSrc: Oral Oral Oral Oral  Resp: 18 20 18 18   Height:  5\' 2"  (1.575 m)    Weight:  80.151 kg (176 lb 11.2 oz)    SpO2: 94% 95% 95% 95%    Intake/Output Summary (Last 24 hours) at 02/11/14 1044 Last data filed at 02/10/14 1637  Gross per 24 hour  Intake      0 ml  Output      2 ml  Net     -2 ml    Exam:  General: eomi, ncat Cardiovascular: s1 s 2no m/r/g Respiratory: clea rno added sound Abdomen: soft, slighlty tende rin LQ's, no rebound Skin intact Neuro nad  Data Reviewed: Basic Metabolic Panel:  Recent Labs Lab 02/10/14 0935 02/11/14 0737  NA 138 137  K 3.6* 3.5*  CL 98 100  CO2 22 24  GLUCOSE 187* 126*  BUN 12 15  CREATININE 0.70 0.71  CALCIUM 9.4 8.5   Liver Function Tests:  Recent Labs Lab 02/10/14 0935  02/11/14 0737  AST 34 32  ALT 46* 45*  ALKPHOS 105 124*  BILITOT 0.5 0.4  PROT 7.7 6.3  ALBUMIN 3.5 2.7*   No results found for this basename: LIPASE, AMYLASE,  in the last 168 hours No results found for this basename: AMMONIA,  in the last 168 hours CBC:  Recent Labs Lab 02/10/14 0935 02/11/14 0737  WBC 27.7* 29.0*  NEUTROABS 24.4*  --   HGB 15.6* 15.1*  HCT 44.3 44.6  MCV 87.4 89.9  PLT 384 368   Cardiac Enzymes: No results found for this basename: CKTOTAL, CKMB, CKMBINDEX, TROPONINI,  in the last 168 hours BNP: No components found with this basename: POCBNP,  CBG: No results found for this basename: GLUCAP,  in the last 168 hours  Recent Results (from the past 240 hour(s))  CLOSTRIDIUM DIFFICILE BY PCR     Status: None   Collection Time    02/10/14  1:35 PM      Result Value Ref Range Status   C difficile by pcr NEGATIVE  NEGATIVE Final   Comment: Performed at [facility] [place]  CLOSTRIDIUM DIFFICILE BY PCR     Status: None   Collection Time    02/10/14  9:10 PM      Result Value Ref Range Status   C difficile  by pcr NEGATIVE  NEGATIVE Final     Studies:              All Imaging reviewed and is as per above notation   Scheduled Meds: . amLODipine  5 mg Oral Daily  . Influenza vac split quadrivalent PF  0.5 mL Intramuscular Tomorrow-1000  . metronidazole  500 mg Intravenous 3 times per day   Continuous Infusions:    Assessment/Plan: 1. Colitis-undifferentiated-Rc empirically flagyl/cipor, clear liquid diet, pain control IV dilaudid 1-2 q 2 prn.  Grad diet in am.  White count to be rechecked in am 2. Hld-hold statin until taking PO well 3. GErd-re-implement Protonix 40 daily 4. HTn-contnue amlodipine 5.  Accuretic on hold for now  Code Status: Full Family Communication:  D/w family member at bedside, all questions answered Disposition Plan: inpatient   Pleas Koch, MD  [facility] Pager [phone] 02/11/2014, 10:44 AM    LOS: 1 day

## 2014-02-11 NOTE — Progress Notes (Signed)
Utilization review completed.  

## 2014-02-12 LAB — CBC WITH DIFFERENTIAL/PLATELET
BASOS ABS: 0 10*3/uL (ref 0.0–0.1)
Basophils Relative: 0 % (ref 0–1)
Eosinophils Absolute: 0 10*3/uL (ref 0.0–0.7)
Eosinophils Relative: 0 % (ref 0–5)
HCT: 39.5 % (ref 36.0–46.0)
Hemoglobin: 13.2 g/dL (ref 12.0–15.0)
LYMPHS ABS: 2.7 10*3/uL (ref 0.7–4.0)
LYMPHS PCT: 9 % — AB (ref 12–46)
MCH: 30.1 pg (ref 26.0–34.0)
MCHC: 33.4 g/dL (ref 30.0–36.0)
MCV: 90 fL (ref 78.0–100.0)
MONOS PCT: 8 % (ref 3–12)
Monocytes Absolute: 2.4 10*3/uL — ABNORMAL HIGH (ref 0.1–1.0)
Neutro Abs: 25 10*3/uL — ABNORMAL HIGH (ref 1.7–7.7)
Neutrophils Relative %: 83 % — ABNORMAL HIGH (ref 43–77)
PLATELETS: 298 10*3/uL (ref 150–400)
RBC: 4.39 MIL/uL (ref 3.87–5.11)
RDW: 13.8 % (ref 11.5–15.5)
WBC: 30.1 10*3/uL — AB (ref 4.0–10.5)

## 2014-02-12 MED ORDER — HYDROMORPHONE HCL 1 MG/ML IJ SOLN
1.0000 mg | INTRAMUSCULAR | Status: DC | PRN
  Administered 2014-02-12: 2 mg via INTRAVENOUS
  Administered 2014-02-12 – 2014-02-13 (×3): 1 mg via INTRAVENOUS
  Administered 2014-02-13: 2 mg via INTRAVENOUS
  Administered 2014-02-13: 1 mg via INTRAVENOUS
  Administered 2014-02-14: 2 mg via INTRAVENOUS
  Administered 2014-02-14: 1 mg via INTRAVENOUS
  Administered 2014-02-14: 2 mg via INTRAVENOUS
  Administered 2014-02-14 (×2): 1 mg via INTRAVENOUS
  Filled 2014-02-12 (×3): qty 2
  Filled 2014-02-12 (×5): qty 1
  Filled 2014-02-12: qty 2
  Filled 2014-02-12 (×3): qty 1

## 2014-02-12 MED ORDER — ACETAMINOPHEN-CODEINE #3 300-30 MG PO TABS
1.0000 | ORAL_TABLET | ORAL | Status: DC | PRN
  Administered 2014-02-12: 1 via ORAL
  Administered 2014-02-12 – 2014-02-17 (×21): 2 via ORAL
  Filled 2014-02-12 (×23): qty 2

## 2014-02-12 NOTE — Progress Notes (Signed)
Note: This document was prepared with digital dictation and possible smart phrase technology. Any transcriptional errors that result from this process are unintentional.   Miranda DickSylvia W Fiero ZOX:[id number] DOB: 1940/11/01 DOA: 02/10/2014 PCP: Lemont Fillers'SULLIVAN,[name] S., NP  Brief narrative: HPI:  73 y/o h/o R subtroch Hip #, L knee arthroplasty, htn, gerd, osteoporosis, HLd, prior diverticulitis with a known history possible cystitis depression atrophy treated with 7 days of ciprofloxacin. Present to her primary care physician's office today with three-day history of abdominal pain diarrhea which became bloody [facility] CT showed colitis, Cdiff neg   Past medical history-As per Problem list Chart reviewed as below-  Procedures:  CT  Antibiotics:   Flagyl 10/1  Cipro 10/1   Subjective   Alert pleasant oriented Abdominal pain is improved to some extent but is atretic the feel nauseous 1 dark stool earlier this morning   Objective    Interim History:   Telemetry: non   Objective: Filed Vitals:   02/11/14 1339 02/11/14 1700 02/11/14 2218 02/12/14 0536  BP: 121/60 118/54 112/57 120/59  Pulse: 98 97 99 85  Temp: 98.6 F (37 C) 98.9 F (37.2 C) 99.1 F (37.3 C) 98.6 F (37 C)  TempSrc: Oral Oral Oral Oral  Resp: 20 10 12 10   Height:      Weight:      SpO2: 96% 89% 93% 92%    Intake/Output Summary (Last 24 hours) at 02/12/14 0955 Last data filed at 02/12/14 [id number]  Gross per 24 hour  Intake    520 ml  Output      0 ml  Net    520 ml    Exam:  General: eomi, ncat Cardiovascular: s1 s 2no m/r/g Respiratory: clea rno added sound Abdomen: soft, slighlty tende rin LQ's, no rebound-exam is less impressive than before for pain Skin intact Neuro nad  Data Reviewed: Basic Metabolic Panel:  Recent Labs Lab 02/10/14 0935 02/11/14 0737  NA 138 137  K 3.6* 3.5*  CL 98 100  CO2 22 24  GLUCOSE 187* 126*  BUN 12 15  CREATININE 0.70 0.71  CALCIUM 9.4 8.5    Liver Function Tests:  Recent Labs Lab 02/10/14 0935 02/11/14 0737  AST 34 32  ALT 46* 45*  ALKPHOS 105 124*  BILITOT 0.5 0.4  PROT 7.7 6.3  ALBUMIN 3.5 2.7*   No results found for this basename: LIPASE, AMYLASE,  in the last 168 hours No results found for this basename: AMMONIA,  in the last 168 hours CBC:  Recent Labs Lab 02/10/14 0935 02/11/14 0737 02/12/14 0605  WBC 27.7* 29.0* 30.1*  NEUTROABS 24.4*  --  25.0*  HGB 15.6* 15.1* 13.2  HCT 44.3 44.6 39.5  MCV 87.4 89.9 90.0  PLT 384 368 298   Cardiac Enzymes: No results found for this basename: CKTOTAL, CKMB, CKMBINDEX, TROPONINI,  in the last 168 hours BNP: No components found with this basename: POCBNP,  CBG: No results found for this basename: GLUCAP,  in the last 168 hours  Recent Results (from the past 240 hour(s))  CLOSTRIDIUM DIFFICILE BY PCR     Status: None   Collection Time    02/10/14  1:35 PM      Result Value Ref Range Status   C difficile by pcr NEGATIVE  NEGATIVE Final   Comment: [facility]  CLOSTRIDIUM DIFFICILE BY PCR     Status: None   Collection Time    02/10/14  9:10 PM  Result Value Ref Range Status   C difficile by pcr NEGATIVE  NEGATIVE Final     Studies:              All Imaging reviewed and is as per above notation   Scheduled Meds: . amLODipine  5 mg Oral Daily  . calcium-vitamin D  1 tablet Oral BID  . ciprofloxacin  400 mg Intravenous Q12H  . famotidine  20 mg Oral BID  . metronidazole  500 mg Intravenous 3 times per day  . pantoprazole  40 mg Oral Daily   Continuous Infusions:    Assessment/Plan: 1. Colitis-undifferentiated-Rc empirically flagyl/cipor, clear liquid diet, pain control IV dilaudid 1-2 q 2 prn. Start Tylenol #3 by mouth for moderate pain  Grad diet to soft diet this morning.  White count still elevated however clinically looks better-repeat CBC in 2. Hld-hold statin until taking PO well 3. GErd-re-implement Protonix 40  daily-Pepcid 20 mg twice a day added 4. HTn-contnue amlodipine 5.  Accuretic on hold for now  Code Status: Full Family Communication:  D/w family member at bedside, all questions answered Disposition Plan: inpatient   Pleas Koch, MD  [facility] Pager [phone] 02/12/2014, 9:55 AM    LOS: 2 days

## 2014-02-12 NOTE — Progress Notes (Signed)
Medicare Important Message given?  YES (If response is "NO", the following Medicare IM given date fields will be blank) Date Medicare IM given:  02/12/2014 Medicare IM given by:  Buena Boehm 

## 2014-02-13 ENCOUNTER — Inpatient Hospital Stay (HOSPITAL_COMMUNITY): Payer: Medicare Other

## 2014-02-13 LAB — CBC WITH DIFFERENTIAL/PLATELET
Basophils Absolute: 0.3 10*3/uL — ABNORMAL HIGH (ref 0.0–0.1)
Basophils Relative: 1 % (ref 0–1)
EOS ABS: 0.3 10*3/uL (ref 0.0–0.7)
Eosinophils Relative: 1 % (ref 0–5)
HCT: 36 % (ref 36.0–46.0)
Hemoglobin: 12 g/dL (ref 12.0–15.0)
LYMPHS PCT: 7 % — AB (ref 12–46)
Lymphs Abs: 1.9 10*3/uL (ref 0.7–4.0)
MCH: 28.8 pg (ref 26.0–34.0)
MCHC: 33.3 g/dL (ref 30.0–36.0)
MCV: 86.3 fL (ref 78.0–100.0)
Monocytes Absolute: 1.6 10*3/uL — ABNORMAL HIGH (ref 0.1–1.0)
Monocytes Relative: 6 % (ref 3–12)
NEUTROS PCT: 85 % — AB (ref 43–77)
Neutro Abs: 23.2 10*3/uL — ABNORMAL HIGH (ref 1.7–7.7)
Platelets: 315 10*3/uL (ref 150–400)
RBC: 4.17 MIL/uL (ref 3.87–5.11)
RDW: 13.6 % (ref 11.5–15.5)
WBC: 27.3 10*3/uL — ABNORMAL HIGH (ref 4.0–10.5)

## 2014-02-13 NOTE — Progress Notes (Signed)
Note: This document was prepared with digital dictation and possible smart phrase technology. Any transcriptional errors that result from this process are unintentional.   Miranda DickSylvia W [name] ZOX:[id number] DOB: 1940/09/21 DOA: 02/10/2014 PCP: [place] Fillers'SULLIVAN,[name] S., NP  Brief narrative: HPI:  73 y/o h/o R subtroch Hip #, L knee arthroplasty, htn, gerd, osteoporosis, HLd, prior diverticulitis with a known history possible cystitis depression atrophy treated with 7 days of ciprofloxacin. Present to her primary care physician's office today with three-day history of abdominal pain diarrhea which became bloody [facility], CT showed colitis, Cdiff neg.  Started on IV flagyl and transitioned to cipro + fl;agyll   Past medical history-As per Problem list Chart reviewed as below-  Procedures:  CT  Antibiotics:   Flagyl 10/1  Cipro 10/1   Subjective   Alert pleasant oriented Didn;t tolerate am diet and thinks liquids may be better.  No cp or sob Pain ~4/10-8/10   Objective    Interim History:   Telemetry: non   Objective: Filed Vitals:   02/12/14 0536 02/12/14 1423 02/12/14 2120 02/13/14 0513  BP: 120/59 102/53 141/59 133/58  Pulse: 85 103 98 98  Temp: 98.6 F (37 C) 98.7 F (37.1 C) 98.7 F (37.1 C) 98.3 F (36.8 C)  TempSrc: Oral Oral Oral Oral  Resp: 10 16 18 18   Height:      Weight:      SpO2: 92% 97% 94% 94%   No intake or output data in the 24 hours ending 02/13/14 0941  Exam:  General: eomi, ncat Cardiovascular: s1 s 2no m/r/g Respiratory: clea rno added sound Abdomen: soft, slighlty tender in LQ's, no rebound-bloated and tender RLQ Skin intact Neuro nad  Data Reviewed: Basic Metabolic Panel:  Recent Labs Lab 02/10/14 0935 02/11/14 0737  NA 138 137  K 3.6* 3.5*  CL 98 100  CO2 22 24  GLUCOSE 187* 126*  BUN 12 15  CREATININE 0.70 0.71  CALCIUM 9.4 8.5   Liver Function Tests:  Recent Labs Lab 02/10/14 0935 02/11/14 0737  AST 34 32    ALT 46* 45*  ALKPHOS 105 124*  BILITOT 0.5 0.4  PROT 7.7 6.3  ALBUMIN 3.5 2.7*   No results found for this basename: LIPASE, AMYLASE,  in the last 168 hours No results found for this basename: AMMONIA,  in the last 168 hours CBC:  Recent Labs Lab 02/10/14 0935 02/11/14 0737 02/12/14 0605 02/13/14 0610  WBC 27.7* 29.0* 30.1* 27.3*  NEUTROABS 24.4*  --  25.0* 23.2*  HGB 15.6* 15.1* 13.2 12.0  HCT 44.3 44.6 39.5 36.0  MCV 87.4 89.9 90.0 86.3  PLT 384 368 298 315   Cardiac Enzymes: No results found for this basename: CKTOTAL, CKMB, CKMBINDEX, TROPONINI,  in the last 168 hours BNP: No components found with this basename: POCBNP,  CBG: No results found for this basename: GLUCAP,  in the last 168 hours  Recent Results (from the past 240 hour(s))  CLOSTRIDIUM DIFFICILE BY PCR     Status: None   Collection Time    02/10/14  1:35 PM      Result Value Ref Range Status   C difficile by pcr NEGATIVE  NEGATIVE Final   Comment: Performed at [facility]  CLOSTRIDIUM DIFFICILE BY PCR     Status: None   Collection Time    02/10/14  9:10 PM      Result Value Ref Range Status   C difficile by pcr NEGATIVE  NEGATIVE Final  Studies:              All Imaging reviewed and is as per above notation   Scheduled Meds: . amLODipine  5 mg Oral Daily  . calcium-vitamin D  1 tablet Oral BID  . ciprofloxacin  400 mg Intravenous Q12H  . famotidine  20 mg Oral BID  . metronidazole  500 mg Intravenous 3 times per day  . pantoprazole  40 mg Oral Daily   Continuous Infusions:    Assessment/Plan: 1. Colitis-undifferentiated-Rc empirically flagyl/cipor, clear liquid diet, pain control IV dilaudid 1-2 q 2 prn. Start Tylenol #3 by mouth for moderate pain   White count 27 down from 30.  If no improvement in pain in next 24 hours consider rpt imaging 2. Hld-hold statin until taking PO well 3. GErd-re-implement Protonix 40 daily-Pepcid 20 mg twice a day added 4. HTn-contnue amlodipine  5.  Accuretic on hold for now  Code Status: Full Family Communication:  D/w family member at bedside, all questions answered Disposition Plan: inpatient   Pleas Koch, MD  [facility] Pager [phone] 02/13/2014, 9:41 AM    LOS: 3 days

## 2014-02-14 LAB — BASIC METABOLIC PANEL
Anion gap: 13 (ref 5–15)
BUN: 11 mg/dL (ref 6–23)
CO2: 22 mEq/L (ref 19–32)
Calcium: 8.2 mg/dL — ABNORMAL LOW (ref 8.4–10.5)
Chloride: 93 mEq/L — ABNORMAL LOW (ref 96–112)
Creatinine, Ser: 0.66 mg/dL (ref 0.50–1.10)
GFR calc non Af Amer: 86 mL/min — ABNORMAL LOW (ref >90)
Glucose, Bld: 109 mg/dL — ABNORMAL HIGH (ref 70–99)
Potassium: 3.4 mEq/L — ABNORMAL LOW (ref 3.7–5.3)
Sodium: 128 mEq/L — ABNORMAL LOW (ref 137–147)

## 2014-02-14 LAB — CBC
HCT: 37.2 % (ref 36.0–46.0)
Hemoglobin: 12.8 g/dL (ref 12.0–15.0)
MCH: 29.6 pg (ref 26.0–34.0)
MCHC: 34.4 g/dL (ref 30.0–36.0)
MCV: 86.1 fL (ref 78.0–100.0)
Platelets: 347 10*3/uL (ref 150–400)
RBC: 4.32 MIL/uL (ref 3.87–5.11)
RDW: 13.9 % (ref 11.5–15.5)
WBC: 34.1 10*3/uL — AB (ref 4.0–10.5)

## 2014-02-14 MED ORDER — QUINAPRIL-HYDROCHLOROTHIAZIDE 20-25 MG PO TABS: 1.0000 | ORAL_TABLET | Freq: Every day | ORAL | Status: DC

## 2014-02-14 MED ORDER — HYDROCHLOROTHIAZIDE 25 MG PO TABS
25.0000 mg | ORAL_TABLET | Freq: Every day | ORAL | Status: DC
  Administered 2014-02-14 – 2014-02-15 (×2): 25 mg via ORAL
  Filled 2014-02-14 (×2): qty 1

## 2014-02-14 MED ORDER — LISINOPRIL 20 MG PO TABS
20.0000 mg | ORAL_TABLET | Freq: Every day | ORAL | Status: DC
  Administered 2014-02-14 – 2014-02-17 (×4): 20 mg via ORAL
  Filled 2014-02-14 (×4): qty 1

## 2014-02-14 NOTE — Progress Notes (Signed)
Note: This document was prepared with digital dictation and possible smart phrase technology. Any transcriptional errors that result from this process are unintentional.   [name] ZOX:[id number] DOB: 15-May-1940 DOA: 02/10/2014 PCP: [facility]ont Fillers., NP  Brief narrative: HPI:  73 y/o h/o R subtroch Hip #, L knee arthroplasty, htn, gerd, osteoporosis, HLd, prior diverticulitis with a known history possible cystitis depression atrophy treated with 7 days of ciprofloxacin. Present to her primary care physician's office today with three-day history of abdominal pain diarrhea which became bloody Sent to [facility], CT showed colitis, Cdiff neg.  Started on IV flagyl and transitioned to cipro + flagyl. Pain control was suboptimal and patient couldn;t be advanced to full diet quickly   Past medical history-As per Problem list Chart reviewed as below-  Procedures:  CT  Antibiotics:   Flagyl 10/1  Cipro 10/1   Subjective   Alert pleasant oriented Feels much better.  Pain negligible.  Drinking broth No cp/n/v/ Formed stool earlier this am   Objective    Interim History:   Telemetry: non   Objective: Filed Vitals:   02/13/14 0513 02/13/14 1500 02/13/14 2129 02/14/14 0530  BP: 133/58 126/51 119/47 151/68  Pulse: 98 95 97 90  Temp: 98.3 F (36.8 C) 98.5 F (36.9 C) 99 F (37.2 C) 98.1 F (36.7 C)  TempSrc: Oral Oral Oral Oral  Resp: 18 18 18 18   Height:      Weight:      SpO2: 94% 95% 93% 95%    Intake/Output Summary (Last 24 hours) at 02/14/14 0752 Last data filed at 02/14/14 0527  Gross per 24 hour  Intake    958 ml  Output      0 ml  Net    958 ml    Exam:  General: eomi, ncat Cardiovascular: s1 s 2no m/r/g Respiratory: clea rno added sound Abdomen: soft, non tender Skin intact Neuro nad  Data Reviewed: Basic Metabolic Panel:  Recent Labs Lab 02/10/14 0935 02/11/14 0737 02/14/14 0409  NA 138 137 128*  K 3.6* 3.5* 3.4*  CL 98 100 93*   CO2 22 24 22   GLUCOSE 187* 126* 109*  BUN 12 15 11   CREATININE 0.70 0.71 0.66  CALCIUM 9.4 8.5 8.2*   Liver Function Tests:  Recent Labs Lab 02/10/14 0935 02/11/14 0737  AST 34 32  ALT 46* 45*  ALKPHOS 105 124*  BILITOT 0.5 0.4  PROT 7.7 6.3  ALBUMIN 3.5 2.7*   No results found for this basename: LIPASE, AMYLASE,  in the last 168 hours No results found for this basename: AMMONIA,  in the last 168 hours CBC:  Recent Labs Lab 02/10/14 0935 02/11/14 0737 02/12/14 0605 02/13/14 0610 02/14/14 0409  WBC 27.7* 29.0* 30.1* 27.3* 34.1*  NEUTROABS 24.4*  --  25.0* 23.2*  --   HGB 15.6* 15.1* 13.2 12.0 12.8  HCT 44.3 44.6 39.5 36.0 37.2  MCV 87.4 89.9 90.0 86.3 86.1  PLT 384 368 298 315 347   Cardiac Enzymes: No results found for this basename: CKTOTAL, CKMB, CKMBINDEX, TROPONINI,  in the last 168 hours BNP: No components found with this basename: POCBNP,  CBG: No results found for this basename: GLUCAP,  in the last 168 hours  Recent Results (from the past 240 hour(s))  CLOSTRIDIUM DIFFICILE BY PCR     Status: None   Collection Time    02/10/14  1:35 PM      Result Value Ref Range Status   C  difficile by pcr NEGATIVE  NEGATIVE Final   Comment: [facility]formed [facility]  CLOSTRIDIUM DIFFICILE BY PCR     Status: None   Collection Time    02/10/14  9:10 PM      Result Value Ref Range Status   C difficile by pcr NEGATIVE  NEGATIVE Final     Studies:              All Imaging reviewed and is as per above notation   Scheduled Meds: . amLODipine  5 mg Oral Daily  . calcium-vitamin D  1 tablet Oral BID  . ciprofloxacin  400 mg Intravenous Q12H  . famotidine  20 mg Oral BID  . metronidazole  500 mg Intravenous 3 times per day  . pantoprazole  40 mg Oral Daily   Continuous Infusions:    Assessment/Plan: 1. Colitis-undifferentiated-Rc empirically flagyl/cipor, clear liquid diet, pain control IV dilaudid 1-2 q 2 prn. Start Tylenol #3 by mouth for moderate  pain   White count 34 up from 27. DG axr rpt 10/3 shows no free air and only mild colitis 2. Hld-hold statin until taking PO well 3. GErd-re-implement Protonix 40 daily-Pepcid 20 mg twice a day added 4. HTn-contnue amlodipine 5.  Accuretic added back to medicines 02/14/14 5. Mild hyponatremia-probably from increased free water in diet and no solids.  Rpt labs in am  Code Status: Full Family Communication:  D/w family member at bedside, all questions answered Disposition Plan: inpatient   Pleas KochJai Miranda Bear, MD  [facility] Pager [phone] 02/14/2014, 7:52 AM    LOS: 4 days

## 2014-02-15 LAB — CBC WITH DIFFERENTIAL/PLATELET
BASOS PCT: 2 % — AB (ref 0–1)
Basophils Absolute: 0.6 10*3/uL — ABNORMAL HIGH (ref 0.0–0.1)
EOS ABS: 0.6 10*3/uL (ref 0.0–0.7)
Eosinophils Relative: 2 % (ref 0–5)
HEMATOCRIT: 35.4 % — AB (ref 36.0–46.0)
Hemoglobin: 12.1 g/dL (ref 12.0–15.0)
LYMPHS ABS: 2.5 10*3/uL (ref 0.7–4.0)
Lymphocytes Relative: 9 % — ABNORMAL LOW (ref 12–46)
MCH: 29.2 pg (ref 26.0–34.0)
MCHC: 34.2 g/dL (ref 30.0–36.0)
MCV: 85.5 fL (ref 78.0–100.0)
MONO ABS: 1.7 10*3/uL — AB (ref 0.1–1.0)
Monocytes Relative: 6 % (ref 3–12)
NEUTROS ABS: 22.4 10*3/uL — AB (ref 1.7–7.7)
Neutrophils Relative %: 81 % — ABNORMAL HIGH (ref 43–77)
Platelets: 355 10*3/uL (ref 150–400)
RBC: 4.14 MIL/uL (ref 3.87–5.11)
RDW: 14.1 % (ref 11.5–15.5)
WBC: 27.8 10*3/uL — ABNORMAL HIGH (ref 4.0–10.5)

## 2014-02-15 LAB — BASIC METABOLIC PANEL
Anion gap: 12 (ref 5–15)
BUN: 11 mg/dL (ref 6–23)
CALCIUM: 8 mg/dL — AB (ref 8.4–10.5)
CO2: 21 meq/L (ref 19–32)
Chloride: 94 mEq/L — ABNORMAL LOW (ref 96–112)
Creatinine, Ser: 0.7 mg/dL (ref 0.50–1.10)
GFR calc Af Amer: >90 mL/min (ref >90)
GFR calc non Af Amer: 85 mL/min — ABNORMAL LOW (ref >90)
GLUCOSE: 99 mg/dL (ref 70–99)
Potassium: 3.3 mEq/L — ABNORMAL LOW (ref 3.7–5.3)
Sodium: 127 mEq/L — ABNORMAL LOW (ref 137–147)

## 2014-02-15 MED ORDER — PANTOPRAZOLE SODIUM 40 MG PO TBEC
40.0000 mg | DELAYED_RELEASE_TABLET | Freq: Two times a day (BID) | ORAL | Status: DC
  Administered 2014-02-15 – 2014-02-17 (×4): 40 mg via ORAL
  Filled 2014-02-15 (×4): qty 1

## 2014-02-15 MED ORDER — NYSTATIN 100000 UNIT/ML MT SUSP
5.0000 mL | Freq: Four times a day (QID) | OROMUCOSAL | Status: DC
  Administered 2014-02-15 – 2014-02-17 (×9): 500000 [IU] via ORAL
  Filled 2014-02-15 (×12): qty 5

## 2014-02-15 MED ORDER — METRONIDAZOLE IN NACL 5-0.79 MG/ML-% IV SOLN
500.0000 mg | Freq: Three times a day (TID) | INTRAVENOUS | Status: DC
  Administered 2014-02-16 (×2): 500 mg via INTRAVENOUS
  Filled 2014-02-15 (×4): qty 100

## 2014-02-15 MED ORDER — CIPROFLOXACIN HCL 500 MG PO TABS
500.0000 mg | ORAL_TABLET | Freq: Two times a day (BID) | ORAL | Status: DC
  Filled 2014-02-15 (×2): qty 1

## 2014-02-15 MED ORDER — CIPROFLOXACIN IN D5W 400 MG/200ML IV SOLN
400.0000 mg | Freq: Two times a day (BID) | INTRAVENOUS | Status: DC
  Administered 2014-02-15 – 2014-02-16 (×2): 400 mg via INTRAVENOUS
  Filled 2014-02-15 (×3): qty 200

## 2014-02-15 MED ORDER — METRONIDAZOLE 500 MG PO TABS
500.0000 mg | ORAL_TABLET | Freq: Three times a day (TID) | ORAL | Status: DC
  Administered 2014-02-15: 500 mg via ORAL
  Filled 2014-02-15 (×3): qty 1

## 2014-02-15 MED ORDER — HYDROMORPHONE HCL 1 MG/ML IJ SOLN: 1.0000 mg | INTRAMUSCULAR | Status: DC | PRN

## 2014-02-15 NOTE — Progress Notes (Signed)
ANTIBIOTIC CONSULT NOTE - FOLLOW UP  Pharmacy Consult for Cipro Indication: intra-abdominal infection  Allergies  Allergen Reactions  . Atorvastatin Other (See Comments)    REACTION: MUSCLE PAINS  . Diclofenac-Misoprostol Diarrhea and Nausea And Vomiting  . Fenofibrate Other (See Comments)    REACTION: carpopedal spasms  . Rosuvastatin Other (See Comments)    REACTION: MUSCLE PAINS  . Statins Other (See Comments)    myalgias  . Amoxicillin-Pot Clavulanate Other (See Comments)    REACTION: unspecified  . Penicillins Hives  . Sulfonamide Derivatives Hives  . Tuberculin Ppd Other (See Comments)    unknown    Patient Measurements: Height: 5\' 2"  (157.5 cm) Weight: 176 lb 11.2 oz (80.151 kg) IBW/kg (Calculated) : 50.1  Vital Signs: Temp: 98.4 F (36.9 C) (10/05 1330) Temp Source: Oral (10/05 1330) BP: 118/63 mmHg (10/05 1330) Pulse Rate: 104 (10/05 1330)  Labs:  Recent Labs  02/13/14 0610 02/14/14 0409 02/15/14 0533  WBC 27.3* 34.1* 27.8*  HGB 12.0 12.8 12.1  PLT 315 347 355  CREATININE  --  0.66 0.70   Estimated Creatinine Clearance: 62.3 ml/min (by C-G formula based on Cr of 0.7).  Microbiology: Recent Results (from the past 720 hour(s))  CLOSTRIDIUM DIFFICILE BY PCR     Status: None   Collection Time    02/10/14  1:35 PM      Result Value Ref Range Status   C difficile by pcr NEGATIVE  NEGATIVE Final   Comment: Performed at [facility]  CLOSTRIDIUM DIFFICILE BY PCR     Status: None   Collection Time    02/10/14  9:10 PM      Result Value Ref Range Status   C difficile by pcr NEGATIVE  NEGATIVE Final   Assessment:   Day # 5 Cipro and Flagyl for colitis.  Changed to PO earlier today per IV to PO protocol, but now changing back to IV.  WBC still 27, with moderate left shift.  Advanced from clears to full liquid diet today.  Cipro given IV this am; first oral dose had been scheduled for later tonight.  Renal function stable.  Goal of Therapy:   appropriate Cipro dose for renal function and infection  Plan:   Resume Cipro 400 mg IV q12hrs tonight.  Flagyl 500 mg IV q8hrs also resumed.  Will follow renal function, but do not expect any need to adjust Cipro regimen.   Will defer timing of change to PO antibiotics to MD.  Dennie FettersEgan, Noami Bove Donovan, RPh Pager: [phone] 02/15/2014,5:41 PM

## 2014-02-15 NOTE — Progress Notes (Signed)
Medicare Important Message given? YES  (If response is "NO", the following Medicare IM given date fields will be blank)  Date Additional Medicare IM given: 02/15/2014  Additional Medicare IM given by: TAYLOR, DEBORAH  

## 2014-02-15 NOTE — Progress Notes (Signed)
Note: This document was prepared with digital dictation and possible smart phrase technology. Any transcriptional errors that result from this process are unintentional.   Sabino DickSylvia W Mudrick [id number] DOB: Jan 06, 1941 DOA: 02/10/2014 PCP: [facility] Fillers'SULLIVAN,[name] S., NP  Brief narrative: HPI:  73 y/o h/o R subtroch Hip #, L knee arthroplasty, htn, gerd, osteoporosis, HLd, prior diverticulitis with a known history possible cystitis depression atrophy treated with 7 days of ciprofloxacin. Present to her primary care physician's office today with three-day history of abdominal pain diarrhea which became bloody [facility] CT showed colitis, Cdiff neg.  Started on IV flagyl and transitioned to cipro + flagyl. Pain control was suboptimal and patient couldn;t be advanced to full diet quickly   Past medical history-As per Problem list Chart reviewed as below-  Procedures:  CT  Antibiotics:   Flagyl 10/1  Cipro 10/1   Subjective   Alert pleasant oriented Feels overall better Sore throat. Multiple semi-formed stools-non bloody No cp No sob    Objective    Interim History:   Telemetry: non   Objective: Filed Vitals:   02/14/14 1411 02/14/14 2219 02/15/14 0536 02/15/14 0952  BP: 104/54 101/65 161/90 111/72  Pulse: 90 86 103   Temp: 98.7 F (37.1 C) 98.2 F (36.8 C) 98.3 F (36.8 C)   TempSrc: Oral Oral Oral   Resp: 20 20 20    Height:      Weight:      SpO2: 93% 96% 95%     Intake/Output Summary (Last 24 hours) at 02/15/14 1059 Last data filed at 02/15/14 0900  Gross per 24 hour  Intake    780 ml  Output    300 ml  Net    480 ml    Exam:  General: eomi, ncat Cardiovascular: s1 s 2no m/r/g Respiratory: clea rno added sound Abdomen: soft, non tender Skin intact Neuro nad  Data Reviewed: Basic Metabolic Panel:  Recent Labs Lab 02/10/14 0935 02/11/14 0737 02/14/14 0409 02/15/14 0533  NA 138 137 128* 127*  K 3.6* 3.5* 3.4* 3.3*  CL 98 100 93*  94*  CO2 22 24 22 21   GLUCOSE 187* 126* 109* 99  BUN 12 15 11 11   CREATININE 0.70 0.71 0.66 0.70  CALCIUM 9.4 8.5 8.2* 8.0*   Liver Function Tests:  Recent Labs Lab 02/10/14 0935 02/11/14 0737  AST 34 32  ALT 46* 45*  ALKPHOS 105 124*  BILITOT 0.5 0.4  PROT 7.7 6.3  ALBUMIN 3.5 2.7*   No results found for this basename: LIPASE, AMYLASE,  in the last 168 hours No results found for this basename: AMMONIA,  in the last 168 hours CBC:  Recent Labs Lab 02/10/14 0935 02/11/14 0737 02/12/14 0605 02/13/14 0610 02/14/14 0409 02/15/14 0533  WBC 27.7* 29.0* 30.1* 27.3* 34.1* 27.8*  NEUTROABS 24.4*  --  25.0* 23.2*  --  22.4*  HGB 15.6* 15.1* 13.2 12.0 12.8 12.1  HCT 44.3 44.6 39.5 36.0 37.2 35.4*  MCV 87.4 89.9 90.0 86.3 86.1 85.5  PLT 384 368 298 315 347 355   Cardiac Enzymes: No results found for this basename: CKTOTAL, CKMB, CKMBINDEX, TROPONINI,  in the last 168 hours BNP: No components found with this basename: POCBNP,  CBG: No results found for this basename: GLUCAP,  in the last 168 hours  Recent Results (from the past 240 hour(s))  CLOSTRIDIUM DIFFICILE BY PCR     Status: None   Collection Time    02/10/14  1:35 PM  Result Value Ref Range Status   C difficile by pcr NEGATIVE  NEGATIVE Final   Comment: Performed at [facility]  CLOSTRIDIUM DIFFICILE BY PCR     Status: None   Collection Time    02/10/14  9:10 PM      Result Value Ref Range Status   C difficile by pcr NEGATIVE  NEGATIVE Final     Studies:              All Imaging reviewed and is as per above notation   Scheduled Meds: . amLODipine  5 mg Oral Daily  . calcium-vitamin D  1 tablet Oral BID  . ciprofloxacin  400 mg Intravenous Q12H  . famotidine  20 mg Oral BID  . lisinopril  20 mg Oral Daily  . metronidazole  500 mg Intravenous 3 times per day  . nystatin  5 mL Oral QID  . pantoprazole  40 mg Oral BID   Continuous Infusions:     Assessment/Plan: 1. Colitis-undifferentiated-Rc empirically flagyl/cipor, advanced from clear to full liquid diet 10/5, pain control IV dilaudid 1-2 q 4 prn. Start Tylenol #3 by mouth for moderate pain   White count 34 up from 27 now back to 27. DG axr rpt 10/3 shows no free air and only mild colitis.  Continue current care.   2. Thrush-start nystatin swish and swallow 10/5-contineu for 2-3 days after resolution of abx 3. Hld-hold statin until taking PO well 4. GErd-re-implement Protonix 40 daily-increased to PO bid 10/5-Pepcid 20 mg twice a day added 5. HTn-contnue amlodipine 5.  Accuretic added back to medicines 02/14/14-d/c HCTZ component 10/5 6. Mild hyponatremia-probably from increased free water in diet and no solids and HCTZ.  Rpt labs in am  Code Status: Full Family Communication:  D/w family member at bedside, all questions answered Disposition Plan: inpatient   Pleas Koch, MD  [facility] Pager [phone] 02/15/2014, 10:59 AM    LOS: 5 days

## 2014-02-16 LAB — CBC WITH DIFFERENTIAL/PLATELET
BASOS ABS: 0.3 10*3/uL — AB (ref 0.0–0.1)
Basophils Relative: 1 % (ref 0–1)
EOS PCT: 2 % (ref 0–5)
Eosinophils Absolute: 0.5 10*3/uL (ref 0.0–0.7)
HEMATOCRIT: 37.5 % (ref 36.0–46.0)
Hemoglobin: 13 g/dL (ref 12.0–15.0)
Lymphocytes Relative: 11 % — ABNORMAL LOW (ref 12–46)
Lymphs Abs: 2.7 10*3/uL (ref 0.7–4.0)
MCH: 30.4 pg (ref 26.0–34.0)
MCHC: 34.7 g/dL (ref 30.0–36.0)
MCV: 87.6 fL (ref 78.0–100.0)
MONO ABS: 1.5 10*3/uL — AB (ref 0.1–1.0)
Monocytes Relative: 6 % (ref 3–12)
Neutro Abs: 19.9 10*3/uL — ABNORMAL HIGH (ref 1.7–7.7)
Neutrophils Relative %: 80 % — ABNORMAL HIGH (ref 43–77)
Platelets: 371 10*3/uL (ref 150–400)
RBC: 4.28 MIL/uL (ref 3.87–5.11)
RDW: 14.4 % (ref 11.5–15.5)
WBC: 24.9 10*3/uL — ABNORMAL HIGH (ref 4.0–10.5)

## 2014-02-16 LAB — BASIC METABOLIC PANEL
Anion gap: 12 (ref 5–15)
BUN: 11 mg/dL (ref 6–23)
CHLORIDE: 96 meq/L (ref 96–112)
CO2: 23 mEq/L (ref 19–32)
Calcium: 8.4 mg/dL (ref 8.4–10.5)
Creatinine, Ser: 0.82 mg/dL (ref 0.50–1.10)
GFR calc non Af Amer: 70 mL/min — ABNORMAL LOW (ref >90)
GFR, EST AFRICAN AMERICAN: 81 mL/min — AB (ref >90)
Glucose, Bld: 127 mg/dL — ABNORMAL HIGH (ref 70–99)
POTASSIUM: 3.8 meq/L (ref 3.7–5.3)
Sodium: 131 mEq/L — ABNORMAL LOW (ref 137–147)

## 2014-02-16 LAB — GI PATHOGEN PANEL BY PCR, STOOL
C difficile toxin A/B: NEGATIVE
CAMPYLOBACTER BY PCR: NEGATIVE
Cryptosporidium by PCR: NEGATIVE
E COLI (STEC): POSITIVE
E coli (ETEC) LT/ST: NEGATIVE
E coli 0157 by PCR: POSITIVE
G lamblia by PCR: NEGATIVE
NOROVIRUS G1/G2: NEGATIVE
Rotavirus A by PCR: NEGATIVE
SHIGELLA BY PCR: NEGATIVE
Salmonella by PCR: NEGATIVE

## 2014-02-16 MED ORDER — CIPROFLOXACIN HCL 500 MG PO TABS
500.0000 mg | ORAL_TABLET | Freq: Two times a day (BID) | ORAL | Status: DC
  Administered 2014-02-16 – 2014-02-17 (×2): 500 mg via ORAL
  Filled 2014-02-16 (×4): qty 1

## 2014-02-16 MED ORDER — METRONIDAZOLE 500 MG PO TABS
500.0000 mg | ORAL_TABLET | Freq: Three times a day (TID) | ORAL | Status: DC
  Administered 2014-02-16 – 2014-02-17 (×3): 500 mg via ORAL
  Filled 2014-02-16 (×6): qty 1

## 2014-02-16 NOTE — Progress Notes (Signed)
Utilization review complete. Sonnet Rizor RN CCM Case Mgmt  

## 2014-02-16 NOTE — Progress Notes (Signed)
Note: This document was prepared with digital dictation and possible smart phrase technology. Any transcriptional errors that result from this process are unintentional.   Miranda DickSylvia W [name] ZOX:[id number] DOB: October 15, 1940 DOA: 02/10/2014 PCP: [facility] Fillers'[name],Miranda S., NP  Brief narrative: HPI:  73 y/o h/o R subtroch Hip #, L knee arthroplasty, htn, gerd, osteoporosis, HLd, prior diverticulitis with a known history possible cystitis depression atrophy treated with 7 days of ciprofloxacin. Present to her primary care physician's office today with three-day history of abdominal pain diarrhea which became bloody [facility] CT showed colitis, Cdiff neg.  Started on IV flagyl and transitioned to cipro + flagyl. Pain control was suboptimal and patient couldn't be advanced to full diet quickly Transitioned eventually to oral Abx 05/19/13 and soft diet  as White count downa  Little and pain improved   Past medical history-As per Problem list Chart reviewed as below-  Procedures:  CT  Antibiotics:   Flagyl 10/1  Cipro 10/1   Subjective   Alert pleasant oriented Feels overall better Sore throat. gassy    Objective    Interim History:   Telemetry: non   Objective: Filed Vitals:   02/15/14 1330 02/15/14 2124 02/16/14 0522 02/16/14 0922  BP: 118/63 107/70 109/59 110/60  Pulse: 104 97 80   Temp: 98.4 F (36.9 C) 99.3 F (37.4 C) 98.3 F (36.8 C)   TempSrc: Oral Oral Oral   Resp: 20 20 20    Height:      Weight:      SpO2: 95% 95% 96%     Intake/Output Summary (Last 24 hours) at 02/16/14 1204 Last data filed at 02/16/14 1035  Gross per 24 hour  Intake      0 ml  Output    900 ml  Net   -900 ml    Exam:  General: eomi, ncat Cardiovascular: s1 s 2no m/r/g Respiratory: clea rno added sound Abdomen: soft, non tender Skin intact Neuro nad  Data Reviewed: Basic Metabolic Panel:  Recent Labs Lab 02/10/14 0935 02/11/14 0737 02/14/14 0409 02/15/14 0533  02/16/14 0800  NA 138 137 128* 127* 131*  K 3.6* 3.5* 3.4* 3.3* 3.8  CL 98 100 93* 94* 96  CO2 22 24 22 21 23   GLUCOSE 187* 126* 109* 99 127*  BUN 12 15 11 11 11   CREATININE 0.70 0.71 0.66 0.70 0.82  CALCIUM 9.4 8.5 8.2* 8.0* 8.4   Liver Function Tests:  Recent Labs Lab 02/10/14 0935 02/11/14 0737  AST 34 32  ALT 46* 45*  ALKPHOS 105 124*  BILITOT 0.5 0.4  PROT 7.7 6.3  ALBUMIN 3.5 2.7*   No results found for this basename: LIPASE, AMYLASE,  in the last 168 hours No results found for this basename: AMMONIA,  in the last 168 hours CBC:  Recent Labs Lab 02/10/14 0935  02/12/14 0605 02/13/14 0610 02/14/14 0409 02/15/14 0533 02/16/14 0800  WBC 27.7*  < > 30.1* 27.3* 34.1* 27.8* 24.9*  NEUTROABS 24.4*  --  25.0* 23.2*  --  22.4* 19.9*  HGB 15.6*  < > 13.2 12.0 12.8 12.1 13.0  HCT 44.3  < > 39.5 36.0 37.2 35.4* 37.5  MCV 87.4  < > 90.0 86.3 86.1 85.5 87.6  PLT 384  < > 298 315 347 355 371  < > = values in this interval not displayed. Cardiac Enzymes: No results found for this basename: CKTOTAL, CKMB, CKMBINDEX, TROPONINI,  in the last 168 hours BNP: No components found with this basename:  POCBNP,  CBG: No results found for this basename: GLUCAP,  in the last 168 hours  Recent Results (from the past 240 hour(s))  CLOSTRIDIUM DIFFICILE BY PCR     Status: None   Collection Time    02/10/14  1:35 PM      Result Value Ref Range Status   C difficile by pcr NEGATIVE  NEGATIVE Final   Comment: Performed at [facility]  CLOSTRIDIUM DIFFICILE BY PCR     Status: None   Collection Time    02/10/14  9:10 PM      Result Value Ref Range Status   C difficile by pcr NEGATIVE  NEGATIVE Final     Studies:              All Imaging reviewed and is as per above notation   Scheduled Meds: . amLODipine  5 mg Oral Daily  . calcium-vitamin D  1 tablet Oral BID  . ciprofloxacin  500 mg Oral BID  . famotidine  20 mg Oral BID  . lisinopril  20 mg Oral Daily  .  metroNIDAZOLE  500 mg Oral 3 times per day  . nystatin  5 mL Oral QID  . pantoprazole  40 mg Oral BID   Continuous Infusions:    Assessment/Plan: 1. Colitis-undifferentiated-Rc empirically flagyl/cipro  to oral meds 10/6, advanced from clear to full liquid diet 10/5, pain control IV dilaudid 1-2 q 4 prn d/c on 10/6 as lost IV . Continue Tylenol #3 by mouth for moderate pain   White count 34 up from 27 now back to 27-->24. DG axr rpt 10/3 shows no free air and only mild colitis.  2. Thrush-start nystatin swish and swallow 10/5-continue for 2-3 days after resolution of abx 3. Hld-hold statin until taking PO well 4. GERD-re-implement Protonix 40 daily-increased to PO bid 10/5-Pepcid 20 mg twice a day added 5. HTn-contnue amlodipine 5.  Accuretic added back to medicines 02/14/14-d/c HCTZ component 10/5 6. Mild hyponatremia-probably from increased free water in diet and no solids and HCTZ.  Rpt labs show sodium 132.  Labs am  Code Status: Full Family Communication:  D/w family member at bedside, all questions answered Disposition Plan: inpatient   Pleas Koch, MD  [facility] Pager [phone] 02/16/2014, 12:04 PM    LOS: 6 days

## 2014-02-17 ENCOUNTER — Telehealth: Payer: Self-pay | Admitting: Family

## 2014-02-17 DIAGNOSIS — E780 Pure hypercholesterolemia: Secondary | ICD-10-CM

## 2014-02-17 DIAGNOSIS — I10 Essential (primary) hypertension: Secondary | ICD-10-CM

## 2014-02-17 LAB — CBC
HEMATOCRIT: 35.5 % — AB (ref 36.0–46.0)
HEMOGLOBIN: 12.1 g/dL (ref 12.0–15.0)
MCH: 29.3 pg (ref 26.0–34.0)
MCHC: 34.1 g/dL (ref 30.0–36.0)
MCV: 86 fL (ref 78.0–100.0)
Platelets: 344 10*3/uL (ref 150–400)
RBC: 4.13 MIL/uL (ref 3.87–5.11)
RDW: 14.6 % (ref 11.5–15.5)
WBC: 18.3 10*3/uL — ABNORMAL HIGH (ref 4.0–10.5)

## 2014-02-17 LAB — BASIC METABOLIC PANEL
Anion gap: 12 (ref 5–15)
BUN: 11 mg/dL (ref 6–23)
CHLORIDE: 96 meq/L (ref 96–112)
CO2: 22 mEq/L (ref 19–32)
Calcium: 8 mg/dL — ABNORMAL LOW (ref 8.4–10.5)
Creatinine, Ser: 0.78 mg/dL (ref 0.50–1.10)
GFR calc Af Amer: >90 mL/min (ref >90)
GFR calc non Af Amer: 82 mL/min — ABNORMAL LOW (ref >90)
GLUCOSE: 114 mg/dL — AB (ref 70–99)
POTASSIUM: 3.4 meq/L — AB (ref 3.7–5.3)
Sodium: 130 mEq/L — ABNORMAL LOW (ref 137–147)

## 2014-02-17 MED ORDER — ACETAMINOPHEN-CODEINE #3 300-30 MG PO TABS: 1.0000 | ORAL_TABLET | Freq: Three times a day (TID) | ORAL | Status: DC | PRN

## 2014-02-17 MED ORDER — CIPROFLOXACIN HCL 500 MG PO TABS: 500.0000 mg | ORAL_TABLET | Freq: Two times a day (BID) | ORAL | Status: DC

## 2014-02-17 MED ORDER — SODIUM CHLORIDE 0.9 % IV SOLN
INTRAVENOUS | Status: DC
  Filled 2014-02-17 (×3): qty 1000

## 2014-02-17 MED ORDER — POTASSIUM CHLORIDE CRYS ER 20 MEQ PO TBCR
40.0000 meq | EXTENDED_RELEASE_TABLET | Freq: Once | ORAL | Status: AC
  Administered 2014-02-17: 40 meq via ORAL
  Filled 2014-02-17: qty 2

## 2014-02-17 MED ORDER — NYSTATIN 100000 UNIT/ML MT SUSP: 5.0000 mL | Freq: Four times a day (QID) | OROMUCOSAL | Status: DC

## 2014-02-17 MED ORDER — METRONIDAZOLE 500 MG PO TABS: 500.0000 mg | ORAL_TABLET | Freq: Three times a day (TID) | ORAL | Status: DC

## 2014-02-17 NOTE — Discharge Instructions (Signed)
Slowly advance your diet.  Please complete all antibiotics.  It is important that you follow up with Ms. O'sullivan, NP to have your blood work checked in the next 5-7 days as your labs are still abnormal.

## 2014-02-17 NOTE — Discharge Summary (Signed)
Physician Discharge Summary  [name]:[id number] DOB: June 05, 1940 DOA: 02/10/2014  PCP: [name]., NP  Admit date: 02/10/2014 Discharge date: 02/17/2014  Time spent: 45 minutes  Recommendations for Outpatient Follow-up:  1. bmet / cbc at follow up.  WBC still elevated & mild hyponatremia at discharge. 2. Follow up with [facility] scheduled.  Discharge Diagnoses:  Principal Problem:   Colitis Active Problems:   HYPERCHOLESTEROLEMIA   HYPERTENSION   GERD   BARRETTS ESOPHAGUS   OSTEOPOROSIS   URINARY INCONTINENCE   Impaired fasting glucose   Discharge Condition: stable  Diet recommendation: bland soft diet until abdominal pain is completely resolved  Filed Weights   02/10/14 1527  Weight: 80.151 kg (176 lb 11.2 oz)    History of present illness:  [name] is a 73 yo female with a history of diverticulitis and multiple orthopedic surgeries who presented to her primary care provider's office on the day of admission with a 3 day history of abdominal pain and diarrhea which became bloody.  Hospital Course:   Colitis Evidenced on CT scan (results below) Treated with cipro and flagyl IV inpatient. Will be discharged on oral cipro and flagyl for a total 14 day course of antibiotics.  Presumed infectious, but C-diff negative. Unfortunately her hospital course was prolonged due to significant pain and subsequent ileus. At the time of discharge the patient's diarrhea has virtually resolved. Stools are no longer bloody. She is tolerating a solid diet and her pain has decreased significantly. Follow up has been scheduled with [facility] in 2 weeks .  Thrush Secondary to antibiotics. Patient was treated inpatient with nystatin swish and swallow She will need to continue on nystatin for several days after finishing antibiotics.  GERD Treated with PPI and pepcid.  HTN Stable. Continue previous home medications.  Mild hypokalemia  Secondary to GI  losses Repleted orally  Mild hyponatremia (130) Anticipate this will resolve as her diet is restored to normal. Please check BMET at PCP follow up.  Consider discontinuation of HCTZ if not resolved.   Procedures:  none  Consultations:  none  Discharge Exam: Filed Vitals:   02/17/14 1332  BP: 122/51  Pulse: 97  Temp: 98.9 F (37.2 C)  Resp: 18   General:  Awake, alert, sitting up in bed, appears well. CV:  Rrr, no m/r/g, no lower extremity edema Chest:  cta no w/r/c, no increased work of breathing. Abdomen:  Soft, slightly tender to palpation.  +bs, no masses Extremities:  5/5 strength in each.  Able to ambulate.  Discharge Instructions   Discharge Instructions   Diet - low sodium heart healthy    Complete by:  As directed      Increase activity slowly    Complete by:  As directed           Discharge Medication List as of 02/17/2014  2:25 PM    START taking these medications   Details  metroNIDAZOLE (FLAGYL) 500 MG tablet Take 1 tablet (500 mg total) by mouth every 8 (eight) hours., Starting 02/17/2014, Until Discontinued, Print      CONTINUE these medications which have CHANGED   Details  ciprofloxacin (CIPRO) 500 MG tablet Take 1 tablet (500 mg total) by mouth 2 (two) times daily., Starting 02/17/2014, Until Discontinued, Print      CONTINUE these medications which have NOT CHANGED   Details  amLODipine (NORVASC) 5 MG tablet Take 1 tablet (5 mg total) by mouth daily., Starting 11/17/2013, Until Discontinued, Fax  Ascorbic Acid (VITAMIN C) 1000 MG tablet Take 1,000 mg by mouth daily., Until Discontinued, Historical Med    aspirin 325 MG tablet Take 325 mg by mouth daily.  , Until Discontinued, Historical Med    beta carotene 15 MG capsule Take 15 mg by mouth daily.  , Until Discontinued, Historical Med    bifidobacterium infantis (ALIGN) capsule Take 1 capsule by mouth daily.  , Until Discontinued, Historical Med    Calcium Carbonate-Vitamin D (CALTRATE  600+D) 600-400 MG-UNIT per tablet Take 1 tablet by mouth 2 (two) times daily., Until Discontinued, Historical Med    carboxymethylcellulose (REFRESH PLUS) 0.5 % SOLN Place 1 drop into both eyes 2 (two) times daily., Until Discontinued, Historical Med    Coenzyme Q10 200 MG capsule Take 200 mg by mouth daily.  , Until Discontinued, Historical Med    ezetimibe (ZETIA) 10 MG tablet Take 1 tablet (10 mg total) by mouth daily., Starting 11/17/2013, Until Discontinued, Normal    fluticasone (FLONASE) 50 MCG/ACT nasal spray Place 2 sprays into both nostrils daily as needed. Each nostril., Starting 06/08/2013, Until Discontinued, Normal    Multiple Vitamin (MULTIVITAMIN) tablet Take 1 tablet by mouth daily.  , Until Discontinued, Historical Med    niacin 500 MG tablet Take 1,500 mg by mouth at bedtime., Until Discontinued, Historical Med    Omega-3 Fatty Acids (FISH OIL) 1000 MG CAPS Take 1 capsule by mouth every morning. , Until Discontinued, Historical Med    omeprazole (PRILOSEC) 20 MG capsule Take 20 mg by mouth See admin instructions. Takes 1 cap daily and then 1 cap as needed, Until Discontinued, Historical Med    potassium chloride (MICRO-K) 10 MEQ CR capsule Take 1 capsule (10 mEq total) by mouth daily., Starting 11/17/2013, Until Discontinued, Normal    quinapril-hydrochlorothiazide (ACCURETIC) 20-25 MG per tablet Take 1 tablet by mouth daily., Starting 11/17/2013, Until Discontinued, Normal    vitamin E (VITAMIN E) 400 UNIT capsule Take 400 Units by mouth daily.  , Until Discontinued, Historical Med      STOP taking these medications     ibuprofen (ADVIL,MOTRIN) 200 MG tablet      loperamide (IMODIUM A-D) 2 MG tablet      Magnesium 500 MG TABS        Allergies  Allergen Reactions  . Atorvastatin Other (See Comments)    REACTION: MUSCLE PAINS  . Diclofenac-Misoprostol Diarrhea and Nausea And Vomiting  . Fenofibrate Other (See Comments)    REACTION: carpopedal spasms  .  Rosuvastatin Other (See Comments)    REACTION: MUSCLE PAINS  . Statins Other (See Comments)    myalgias  . Amoxicillin-Pot Clavulanate Other (See Comments)    REACTION: unspecified  . Penicillins Hives  . Sulfonamide Derivatives Hives  . Tuberculin Ppd Other (See Comments)    unknown   Follow-up Information   Follow up with O'SULLIVAN,[name] S., NP In 1 week.   Specialty:  Internal Medicine   Contact information:   [address] [place] [address] [phone]       Follow up with Willette Cluster, NP On 03/03/2014. (Arrive at 9:30 am to See Gunnar Fusi, Dr. Regino Schultze Advanced Practice Provider.)    Specialty:  Nurse Practitioner   Contact information:   [address] [place] [address] [phone]        The results of significant diagnostics from this hospitalization (including imaging, microbiology, ancillary and laboratory) are listed below for reference.    Significant Diagnostic Studies:  Ct Abdomen Pelvis W Contrast  02/10/2014   CLINICAL DATA:  Abdominal pain with right lower quadrant tenderness. Nausea. Diarrhea for 3 days. Hysterectomy. Appendectomy. Cholecystectomy.  EXAM: CT ABDOMEN AND PELVIS WITH CONTRAST  TECHNIQUE: Multidetector CT imaging of the abdomen and pelvis was performed using the standard protocol following bolus administration of intravenous contrast.  CONTRAST:  50mL OMNIPAQUE IOHEXOL 300 MG/ML SOLN, 100mL OMNIPAQUE IOHEXOL 300 MG/ML SOLN  COMPARISON:  10/15/2011  FINDINGS: Lower chest: Clear lung bases. Mild cardiomegaly with coronary artery atherosclerosis. A small hiatal hernia with contrast filled lower thoracic esophagus.  Hepatobiliary: Moderate hepatic steatosis, without focal liver lesion. Cholecystectomy, without biliary ductal dilatation.  Pancreas: Low-density in the pancreatic body on image 27 is secondary to interdigitation of peripancreatic fat.  Spleen: Normal  Adrenals/Urinary Tract: Scarring or a too small to characterize  lesion in the interpolar left kidney. Normal right kidney. Normal adrenal glands. Normal urinary bladder.  Stomach/Bowel: Normal distal stomach. Sigmoid diverticulosis. Moderate wall thickening of and edema surrounding the ascending and transverse colon. No pneumatosis or free intraperitoneal air. Normal terminal ileum. Appendectomy.  Normal small bowel loops.  Vascular/Lymphatic: Moderate aortic and branch vessel atherosclerosis. No evidence of mesenteric embolism. The inferior mesenteric artery is patent proximally. No retroperitoneal or retrocrural adenopathy. Small ileocolic mesenteric nodes are likely reactive. No pelvic adenopathy.  Reproductive: Hysterectomy. Mild pelvic floor laxity. No adnexal mass. Trace abdominal and small volume pelvic cul-de-sac fluid. No collection to suggest abscess.  Other:  None  Musculoskeletal: Right hip arthroplasty. Degenerative disc disease, including at L3 through S1.  IMPRESSION: 1. Extensive colitis. Distribution favors infection. Recommend clinical exclusion of C difficile colitis. Ischemia and inflammatory bowel disease felt less likely, given distribution. 2. Small volume abdominal pelvic ascites, presumably secondary. 3.  Atherosclerosis, including within the coronary arteries. 4. Small hiatal hernia. Esophageal air fluid level suggests dysmotility or gastroesophageal reflux. 5. Mild hepatic steatosis.   Electronically Signed   By: Jeronimo GreavesKyle  Talbot M.D.   On: 02/10/2014 11:52   Dg Abd Acute W/chest  02/13/2014   CLINICAL DATA:  Inflammatory bowel disease.  EXAM: ACUTE ABDOMEN SERIES (ABDOMEN 2 VIEW & CHEST 1 VIEW)  COMPARISON:  CT 02/10/2014  FINDINGS: Stable cardiac and mediastinal contours. No consolidative pulmonary opacities. No pleural effusion or pneumothorax. Regional skeleton is unremarkable.  Mildly prominent loops of small bowel are demonstrated within the central abdomen. There is suggestion of wall thickening of the cecum, ascending and transverse colon. No  free intraperitoneal air. Cholecystectomy clips. Lower lumbar spine degenerative change. Right hip intra medullary rod and screw fixation.  IMPRESSION: Suggestion of wall thickening of the visualized colon which may be secondary to colitis.  Prominent loops of small bowel within the central abdomen may be reflective of ileus   Electronically Signed   By: Annia Beltrew  Davis M.D.   On: 02/13/2014 19:53    Microbiology: Recent Results (from the past 240 hour(s))  CLOSTRIDIUM DIFFICILE BY PCR     Status: None   Collection Time    02/10/14  1:35 PM      Result Value Ref Range Status   C difficile by pcr NEGATIVE  NEGATIVE Final   Comment: Performed [facility]  CLOSTRIDIUM DIFFICILE BY PCR     Status: None   Collection Time    02/10/14  9:10 PM      Result Value Ref Range Status   C difficile by pcr NEGATIVE  NEGATIVE Final     Labs: Basic Metabolic Panel:  Recent Labs Lab 02/11/14 0737 02/14/14 0409 02/15/14 0533 02/16/14 0800 02/17/14 0545  NA 137 128* 127* 131* 130*  K 3.5* 3.4* 3.3* 3.8 3.4*  CL 100 93* 94* 96 96  CO2 24 22 21 23 22   GLUCOSE 126* 109* 99 127* 114*  BUN 15 11 11 11 11   CREATININE 0.71 0.66 0.70 0.82 0.78  CALCIUM 8.5 8.2* 8.0* 8.4 8.0*   Liver Function Tests:  Recent Labs Lab 02/11/14 0737  AST 32  ALT 45*  ALKPHOS 124*  BILITOT 0.4  PROT 6.3  ALBUMIN 2.7*   CBC:  Recent Labs Lab 02/12/14 0605 02/13/14 0610 02/14/14 0409 02/15/14 0533 02/16/14 0800 02/17/14 0545  WBC 30.1* 27.3* 34.1* 27.8* 24.9* 18.3*  NEUTROABS 25.0* 23.2*  --  22.4* 19.9*  --   HGB 13.2 12.0 12.8 12.1 13.0 12.1  HCT 39.5 36.0 37.2 35.4* 37.5 35.5*  MCV 90.0 86.3 86.1 85.5 87.6 86.0  PLT 298 315 347 355 371 344    Signed:  Stephani Police, PA-C  [facility] 02/17/2014, 7:57 PM  Attending Patient was seen, examined,treatment plan was discussed with the  Advance Practice Provider.  I have directly reviewed the clinical findings, lab, imaging studies  and management of this patient in detail. I have made the necessary changes to the above noted documentation, and agree with the documentation, as recorded by the Advance Practice Provider.   Admitted with bloody diarrhea and abdominal pain-CT confirmed colitis-C Diff PCR neg. Slowly has improved, abdominal pain almost resolved, diarrhea significantly improved-no longer bloody. When I saw patient on day of discharge, was ready for discharge. Belly was soft and not tender. Spouse at bedside. Patient requested that we make appt with GI MD (Dr Juanda Chance) and that she will make appt with PCP. Since clinically improved, no vomiting, tolerating regular diet-suspect stable for discharge, to continue with Abx as outpatient with close follow up with GI and PCP.  Windell Norfolk MD [facility].

## 2014-02-17 NOTE — Progress Notes (Signed)
Patient discharge teaching given, including activity, diet, follow-up appoints, and medications. Patient verbalized understanding of all discharge instructions. Pt had no IV access. Vitals are stable. Skin is intact except as charted in most recent assessments. Pt to be escorted out by NT, to be driven home by family.

## 2014-02-17 NOTE — Telephone Encounter (Signed)
Caller name: [name] Relation to pt: self Call back number: [phone] Pharmacy[facility]  Reason for call:   Patient just left the hospital and states that the dr forgot to write her an rx for thrush and would like to know if Efraim KaufmannMelissa would write her an rx

## 2014-02-17 NOTE — Care Management Note (Signed)
    Page 1 of 1   02/17/2014     1:50:19 PM CARE MANAGEMENT NOTE 02/17/2014  Patient:  [name],[name]   Account Number:  [id number]  Date Initiated:  02/12/2014  Documentation initiated by:  [facility]  Subjective/Objective Assessment:   abd pain, Colitis     Action/Plan:   Anticipated DC Date:  02/10/2014   Anticipated DC Plan:  HOME/SELF CARE      DC Planning Services  CM consult      Choice offered to / List presented to:             Status of service:  Completed, signed off Medicare Important Message given?  YES (If response is "NO", the following Medicare IM given date fields will be blank) Date Medicare IM given:  02/12/2014 Medicare IM given by:  [facility] Date Additional Medicare IM given:  02/15/2014 Additional Medicare IM given by:  [facility]ily[facility]indl  Discharge Disposition:  HOME/SELF CARE  Per UR Regulation:  Reviewed for med. necessity/level of care/duration of stay  If discussed at Long Length of Stay Meetings, dates discussed:    Comments:

## 2014-02-17 NOTE — Telephone Encounter (Signed)
Spoke with pt. She states she was being treated in the hospital for thrush but doesn't remember the name of the medication; thinks it started with a "N". States it is ok if she doesn't get Rx tonight, can pick it up tomorrow but would still like [facility].

## 2014-02-18 ENCOUNTER — Telehealth: Payer: Self-pay

## 2014-02-18 NOTE — Telephone Encounter (Signed)
Admit date: 02/10/2014  Discharge date: 02/17/2014  Reason for admission: Colitis  Recommendations for Outpatient Follow-up:  1. bmet / cbc at follow up. WBC still elevated & mild hyponatremia at discharge. 2. Follow up with [facility] scheduled.  Transition Care Management Follow-up Telephone Call  How have you been since you were released from the hospital? Pt stated that she is feeling much better.  She is afebrile, but continues to experience abdominal pain,yet the pains are not as severe.  She has had 2 BMs this morning.  Both were formed, soft stools.  No diarrhea or blood present.      Do you understand why you were in the hospital? yes   Do you understand the discharge instrcutions? yes  Items Reviewed:  Medications reviewed: yes  Allergies reviewed: yes  Dietary changes reviewed: yes, bland soft diet until abdominal pain is completely resolved  Referrals reviewed: yes, she has an appointment with [facility] on 03/03/14 @ 0930.     Functional Questionnaire:   Activities of Daily Living (ADLs):   She states they are independent in the following: ambulation, bathing and hygiene, feeding, continence, grooming, toileting and dressing States they require assistance with the following: none   Any transportation issues/concerns?: no   Any patient concerns? no   Confirmed importance and date/time of follow-up visits scheduled: yes   Confirmed with patient if condition begins to worsen call PCP or go to the ER.  Patient was given the Call-a-Nurse line [phone]: yes  Hospital follow up appointment scheduled with Sandford CrazeMelissa O'sullivan, NP on 02/26/14 @ 1:30 pm.

## 2014-02-19 NOTE — Telephone Encounter (Signed)
Caller name: [name]  Relation to pt: self  Call back number: [phone]   Reason for call:   Pt is expericing abdominal pain when using the bathroom, patients states she is on heavy antibiotics,in need of clinical advice

## 2014-02-19 NOTE — Telephone Encounter (Signed)
Pt states she continues to have diarrhea despite antibiotics she is currently taking for colitis. Reports abdominal is "normal" type when needing to have bowel movement, "nothing severe". Advised pt per verbal from Provider that she should be seen in clinic Saturday as he would expect diarrhea to be lessening at this time. Scheduled [facility] for 02/[facility]/14 at 11:[facility]

## 2014-02-20 ENCOUNTER — Other Ambulatory Visit: Payer: Self-pay | Admitting: Family Medicine

## 2014-02-20 ENCOUNTER — Encounter: Payer: Self-pay | Admitting: Family Medicine

## 2014-02-20 ENCOUNTER — Ambulatory Visit (INDEPENDENT_AMBULATORY_CARE_PROVIDER_SITE_OTHER): Payer: Medicare Other | Admitting: Family Medicine

## 2014-02-20 VITALS — BP 130/78 | HR 98 | Temp 99.3°F | Resp 16 | Wt 180.8 lb

## 2014-02-20 DIAGNOSIS — A09 Infectious gastroenteritis and colitis, unspecified: Secondary | ICD-10-CM

## 2014-02-20 DIAGNOSIS — E876 Hypokalemia: Secondary | ICD-10-CM | POA: Insufficient documentation

## 2014-02-20 DIAGNOSIS — E871 Hypo-osmolality and hyponatremia: Secondary | ICD-10-CM | POA: Insufficient documentation

## 2014-02-20 DIAGNOSIS — R197 Diarrhea, unspecified: Secondary | ICD-10-CM

## 2014-02-20 DIAGNOSIS — K529 Noninfective gastroenteritis and colitis, unspecified: Secondary | ICD-10-CM

## 2014-02-20 LAB — COMPREHENSIVE METABOLIC PANEL
ALBUMIN: 3.5 g/dL (ref 3.5–5.2)
ALK PHOS: 127 U/L — AB (ref 39–117)
ALT: 34 U/L (ref 0–35)
AST: 32 U/L (ref 0–37)
BILIRUBIN TOTAL: 0.3 mg/dL (ref 0.2–1.2)
BUN: 8 mg/dL (ref 6–23)
CO2: 23 mEq/L (ref 19–32)
Calcium: 8.9 mg/dL (ref 8.4–10.5)
Chloride: 101 mEq/L (ref 96–112)
Creat: 0.68 mg/dL (ref 0.50–1.10)
GLUCOSE: 112 mg/dL — AB (ref 70–99)
Potassium: 4.6 mEq/L (ref 3.5–5.3)
Sodium: 133 mEq/L — ABNORMAL LOW (ref 135–145)
Total Protein: 6.7 g/dL (ref 6.0–8.3)

## 2014-02-20 NOTE — Progress Notes (Signed)
Pre visit review using our clinic review tool, if applicable. No additional management support is needed unless otherwise documented below in the visit note. 

## 2014-02-20 NOTE — Patient Instructions (Addendum)
Change to Gatorade for fluids and electrolytes. Continue to push fluids [facility], we will call with results. Continue antibiotics. Follow up as scheduled.

## 2014-02-20 NOTE — Progress Notes (Addendum)
Subjective:    Patient ID: [name], female    DOB: July 17, 1940, 73 y.o.   MRN: [id number]  Diarrhea  Pertinent negatives include no abdominal pain or fever.   73 year old female pt of Mellissa Osullivan's presents following release from hospital 3 days ago. [facility]/30 to 10/7 for colitis  Hospital Course:  Colitis  Evidenced on CT scan  Treated with cipro and flagyl IV inpatient. Will be discharged on oral cipro and flagyl for a total 14 day course of antibiotics.  Presumed infectious, but C-diff negative.  Unfortunately her hospital course was prolonged due to significant pain and subsequent ileus.  At the time of discharge the patient's diarrhea has virtually resolved. She is tolerating a solid diet and her pain has decreased significantly.  Follow up has been scheduled with [facility] in 2 weeks.  Thrush  Secondary to antibiotics.  Patient was treated inpatient with nystatin swish and swallow  She will need to continue on nystatin for several days after finishing antibiotics.  GERD  Treated with PPI and pepcid.  HTN  Stable. Continue previous home medications.  Mild hypokalemia  Secondary to GI losses  Repleted orally  Mild hyponatremia (130)  Anticipate this will resolve as her diet is restored to normal.  Please check BMET at PCP follow up. Consider discontinuation of HCTZ if not resolved.    Since she has been home she continues to have diarrhea. She states her BMs are the same from discharge. Diarrhea never did resolve.  She has been eating some in limited fashion, she has poor appetite.  She is able to drink 6 glasses a day. She feels ill overall.  Has pain afterward, leads BM, pain resolves with BM. No further blood in stool. No fever measured but low grade temp today. No nausea or vomiting , she is keeping cipro and flagyl down.     Review of Systems  Constitutional: Negative for fever and fatigue.  HENT: Negative for ear  pain.   Eyes: Negative for pain.  Respiratory: Negative for chest tightness and shortness of breath.   Cardiovascular: Negative for chest pain, palpitations and leg swelling.  Gastrointestinal: Positive for diarrhea. Negative for abdominal pain.  Genitourinary: Negative for dysuria.       Objective:   Physical Exam  Constitutional: Vital signs are normal. She appears well-developed and well-nourished. She is cooperative.  Non-toxic appearance. She does not appear ill. No distress.  HENT:  Head: Normocephalic.  Right Ear: Hearing, tympanic membrane, external ear and ear canal normal. Tympanic membrane is not erythematous, not retracted and not bulging.  Left Ear: Hearing, tympanic membrane, external ear and ear canal normal. Tympanic membrane is not erythematous, not retracted and not bulging.  Nose: No mucosal edema or rhinorrhea. Right sinus exhibits no maxillary sinus tenderness and no frontal sinus tenderness. Left sinus exhibits no maxillary sinus tenderness and no frontal sinus tenderness.  Mouth/Throat: Uvula is midline, oropharynx is clear and moist and mucous membranes are normal.  Eyes: Conjunctivae, EOM and lids are normal. Pupils are equal, round, and reactive to light. Lids are everted and swept, no foreign bodies found.  Neck: Trachea normal and normal range of motion. Neck supple. Carotid bruit is not present. No mass and no thyromegaly present.  Cardiovascular: Normal rate, regular rhythm, S1 normal, S2 normal, normal heart sounds, intact distal pulses and normal pulses.  Exam reveals no gallop and no friction rub.   No murmur heard.  Pulmonary/Chest: Effort normal and breath sounds normal. Not tachypneic. No respiratory distress. She has no decreased breath sounds. She has no wheezes. She has no rhonchi. She has no rales.  Abdominal: Soft. Normal appearance and bowel sounds are normal. There is generalized tenderness. There is rebound. There is no rigidity, no guarding and no  CVA tenderness.  Neurological: She is alert.  Skin: Skin is warm, dry and intact. No rash noted.  Psychiatric: Her speech is normal and behavior is normal. Judgment and thought content normal. Her mood appears not anxious. Cognition and memory are normal. She does not exhibit a depressed mood.          Assessment & Plan:  Addendum: Labs returned showing almost normal, lower wbc from discharge, sodium and potassium are improved from discharge. Pt notified and is feeling better.

## 2014-02-20 NOTE — Assessment & Plan Note (Signed)
Likely will take more time for symptoms to resolve, not worse just continuing.  Blood in stool has resolved.  Will eval with wbc and re-eval electrolytes with CMET. No sign of dehydration on exam

## 2014-02-22 ENCOUNTER — Telehealth: Payer: Self-pay | Admitting: Family

## 2014-02-22 DIAGNOSIS — A044 Other intestinal Escherichia coli infections: Secondary | ICD-10-CM

## 2014-02-22 NOTE — Progress Notes (Signed)
02/22/2014   15:47  Returned a call to Ms. Conkright and her daughter Drinda Buttsnnette.  Family received a call from the Health Department that her stool was positive for [facility].  She has to have a follow up stool study 2 days after she completes antibiotics.  Patient and her daughter were upset that the Health Dept notified her of this diagnosis and she was not told of it prior to discharge [facility].  GI pathogen panel was sent 10/4 and resulted 10/6.  The patient is being treated appropriately with Cipro and Flagyl.  Follow up was scheduled for her with Gastroenterology ([facility] on 10/21) prior to discharge from the hospital.  The patient has not yet returned to work.  She works in a [employer] caf[employer]ia.  Family was concerned about patient being contagious prior to admission.    After a lengthy conversation and apology, I gave the daughter Drinda Butts(Annette) the number to Patient Experience [phone]), and asked her to call me again if I could be of any further assistance.  Algis DownsMarianne York, PA-C [facility] Pager: [phone]

## 2014-02-22 NOTE — Telephone Encounter (Signed)
Reviewed labs- noted that she was infected with e coli 0157, which is a type of e-coli that can cause bloody diarrhea.  No specific treatment except supportive measures. Can sometimes be linked to contaminated beef.  Symptoms generally last 5-10 days.  As long as her symptoms have improved and no blood in stool, I will see her back in the office on 10/16 as scheduled.

## 2014-02-22 NOTE — Telephone Encounter (Signed)
Caller name: [name]  Call back number:[phone]   Reason for call:  Health dept called and informed pt that she had e.coli.  Needs [name] to research records; contact pt for more info.

## 2014-02-23 NOTE — Telephone Encounter (Signed)
Spoke with pt and she voices understanding. Pt states she was very upset that she was not informed of this before she left the hospital and wanted to make sure we were aware. Pt states she has spoken with the health dept and they told her she must remain out of work until 48 hours after completing antibiotic. She will need to complete stool test at that time then wait 24 hours and complete additional stool test. Pt states she was told to turn tests in to us and for us to forward the information to the health dept. Pt will keep follow up with us on 02/26/14.

## 2014-02-23 NOTE — Telephone Encounter (Signed)
Order signed and dated for 02/26/14.

## 2014-02-23 NOTE — Telephone Encounter (Signed)
See pended stool test order.

## 2014-02-26 ENCOUNTER — Encounter: Payer: Self-pay | Admitting: Family

## 2014-02-26 ENCOUNTER — Ambulatory Visit (INDEPENDENT_AMBULATORY_CARE_PROVIDER_SITE_OTHER): Payer: Medicare Other | Admitting: Family

## 2014-02-26 VITALS — BP 139/59 | HR 94 | Temp 98.2°F | Resp 18 | Ht 62.0 in | Wt 175.0 lb

## 2014-02-26 DIAGNOSIS — A044 Other intestinal Escherichia coli infections: Secondary | ICD-10-CM | POA: Insufficient documentation

## 2014-02-26 DIAGNOSIS — E871 Hypo-osmolality and hyponatremia: Secondary | ICD-10-CM

## 2014-02-26 DIAGNOSIS — K529 Noninfective gastroenteritis and colitis, unspecified: Secondary | ICD-10-CM

## 2014-02-26 DIAGNOSIS — A058 Other specified bacterial foodborne intoxications: Secondary | ICD-10-CM

## 2014-02-26 LAB — CBC WITH DIFFERENTIAL/PLATELET
BASOS PCT: 0.6 % (ref 0.0–3.0)
Basophils Absolute: 0.1 10*3/uL (ref 0.0–0.1)
EOS PCT: 1.7 % (ref 0.0–5.0)
Eosinophils Absolute: 0.2 10*3/uL (ref 0.0–0.7)
HCT: 40 % (ref 36.0–46.0)
HEMOGLOBIN: 13 g/dL (ref 12.0–15.0)
Lymphocytes Relative: 32.4 % (ref 12.0–46.0)
Lymphs Abs: 3.2 10*3/uL (ref 0.7–4.0)
MCHC: 32.4 g/dL (ref 30.0–36.0)
MCV: 90.2 fl (ref 78.0–100.0)
MONO ABS: 0.7 10*3/uL (ref 0.1–1.0)
MONOS PCT: 7.4 % (ref 3.0–12.0)
NEUTROS ABS: 5.8 10*3/uL (ref 1.4–7.7)
Neutrophils Relative %: 57.9 % (ref 43.0–77.0)
PLATELETS: 623 10*3/uL — AB (ref 150.0–400.0)
RBC: 4.44 Mil/uL (ref 3.87–5.11)
RDW: 15.1 % (ref 11.5–15.5)
WBC: 10 10*3/uL (ref 4.0–10.5)

## 2014-02-26 LAB — BASIC METABOLIC PANEL
BUN: 8 mg/dL (ref 6–23)
CHLORIDE: 100 meq/L (ref 96–112)
CO2: 22 mEq/L (ref 19–32)
Calcium: 9.2 mg/dL (ref 8.4–10.5)
Creatinine, Ser: 0.7 mg/dL (ref 0.4–1.2)
GFR: 88.67 mL/min (ref >60.00)
Glucose, Bld: 118 mg/dL — ABNORMAL HIGH (ref 70–99)
POTASSIUM: 4.1 meq/L (ref 3.5–5.1)
Sodium: 135 mEq/L (ref 135–145)

## 2014-02-26 NOTE — Progress Notes (Signed)
Pre visit review using our clinic review tool, if applicable. No additional management support is needed unless otherwise documented below in the visit note. 

## 2014-02-26 NOTE — Progress Notes (Signed)
Subjective:    Patient ID: Miranda DickSylvia W [name], female    DOB: [birth year], 73 y.o.   MRN: [id number]  HPI  [name] is a 73 yr old female who presents today for hospital follow up. She was seen our office on 02/10/14 with bloody diarrhea and sent to the ED. She was then admitted to [facility] where she was hospitalized with infectious colitis 9/30-10/7/15.  During this time she was treated with cipro and flagyl.  She did develop a secondary thrush infection and was treated with nystatin swish and swallow. Since her discharge from the hospital her stool studies revealed + e coli 0157 infection.   Reports that she has more formed but "soft" stools. She remains tired but overall is feeling much better.   Thrush- reports symptoms resolved after completio of nystatin swish/swallow.    Review of Systems See HPI  Past Medical History  Diagnosis Date  . Hyperlipidemia   . Hypertension   . Osteoporosis     pt cannot tolerate fosamax  . Arthritis     osteoarthritis  . Barrett's esophagus   . GERD (gastroesophageal reflux disease)   . Chronic obstructive bronchitis     Dr Delford FieldWright  . IFG (impaired fasting glucose)   . Colon polyps   . History of nephrolithiasis   . History of chicken pox   . Positive PPD     exposure to 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 Positive PPD only, negative CXR.  [name]. History of transfusion of packed red blood cells   . Urinary incontinence   . Colitis     History   Social History  . Marital Status: Widowed    Spouse Name: N/A    Number of Children: N/A  . Years of Education: N/A   Occupational History  . Retired    Social History Main Topics  . Smoking status: Former Smoker -- 15 years    Quit date: 05/15/1967  . Smokeless tobacco: Never Used  . Alcohol Use: No  . Drug Use: No  . Sexual Activity: Not on file   Other Topics Concern  . Not on file   Social History Narrative   Regular exercise: yes    Past Surgical History  Procedure Laterality Date  .  Abdominal hysterectomy  1970  . Appendectomy    . Cholecystectomy    . Total knee arthroplasty  04/2006    total left knee replacement  . Leg surgery  2008    right    Family History  Problem Relation Age of Onset  . Asthma Mother   . Coronary artery disease    . Hyperlipidemia    . Hypertension    . Arthritis    . Tuberculosis      Allergies  Allergen Reactions  . Atorvastatin Other (See Comments)    REACTION: MUSCLE PAINS  . Diclofenac-Misoprostol Diarrhea and Nausea And Vomiting  . Fenofibrate Other (See Comments)    REACTION: carpopedal spasms  . Rosuvastatin Other (See Comments)    REACTION: MUSCLE PAINS  . Statins Other (See Comments)    myalgias  . Amoxicillin-Pot Clavulanate Other (See Comments)    REACTION: unspecified  . Penicillins Hives  . Sulfonamide Derivatives Hives  . Tuberculin Ppd Other (See Comments)    unknown    Current Outpatient Prescriptions on File Prior to Visit  Medication Sig Dispense Refill  . acetaminophen-codeine (TYLENOL #3) 300-30 MG per tablet Take 1 tablet by mouth every 8 (eight) hours as needed for moderate pain.  30 tablet  0  . amLODipine (NORVASC) 5 MG tablet Take 1 tablet (5 mg total) by mouth daily.  90 tablet  1  . Ascorbic Acid (VITAMIN C) 1000 MG tablet Take 1,000 mg by mouth daily.      [name]. aspirin 325 MG tablet Take 325 mg by mouth daily.        . beta carotene 15 MG capsule Take 15 mg by mouth daily.        . bifidobacterium infantis (ALIGN) capsule Take 1 capsule by mouth daily.        . Calcium Carbonate-Vitamin D (CALTRATE 600+D) 600-400 MG-UNIT per tablet Take 1 tablet by mouth 2 (two) times daily.      . carboxymethylcellulose (REFRESH PLUS) 0.5 % SOLN Place 1 drop into both eyes 2 (two) times daily.      . Coenzyme Q10 200 MG capsule Take 200 mg by mouth daily.        [name]. ezetimibe (ZETIA) 10 MG tablet Take 1 tablet (10 mg total) by mouth daily.  90 tablet  1  . fluticasone (FLONASE) 50 MCG/ACT nasal spray Place 2  sprays into both nostrils daily as needed. Each nostril.  1 g  5  . metroNIDAZOLE (FLAGYL) 500 MG tablet Take 1 tablet (500 mg total) by mouth every 8 (eight) hours.  29 tablet  0  . Multiple Vitamin (MULTIVITAMIN) tablet Take 1 tablet by mouth daily.        . niacin 500 MG tablet Take 1,500 mg by mouth at bedtime.      [name]. nystatin (MYCOSTATIN) 100000 UNIT/ML suspension Take 5 mLs (500,000 Units total) by mouth 4 (four) times daily.  180 mL  0  . Omega-3 Fatty Acids (FISH OIL) 1000 MG CAPS Take 1 capsule by mouth every morning.       [name]. omeprazole (PRILOSEC) 20 MG capsule Take 20 mg by mouth See admin instructions. Takes 1 cap daily and then 1 cap as needed      . potassium chloride (MICRO-K) 10 MEQ CR capsule Take 1 capsule (10 mEq total) by mouth daily.  90 capsule  1  . quinapril-hydrochlorothiazide (ACCURETIC) 20-25 MG per tablet Take 1 tablet by mouth daily.  90 tablet  1  . vitamin E (VITAMIN E) 400 UNIT capsule Take 400 Units by mouth daily.         No current facility-administered medications on file prior to visit.    BP 139/59  Pulse 94  Temp(Src) 98.2 F (36.8 C) (Oral)  Resp 18  Ht 5\' 2"  (1.575 m)  Wt 175 lb (79.379 kg)  BMI 32.00 kg/m2  SpO2 99%  LMP 05/14/1968       Objective:   Physical Exam  Constitutional: She appears well-developed and well-nourished. No distress.  HENT:  Head: Normocephalic and atraumatic.  No oral thrush noted.   Cardiovascular: Normal rate and regular rhythm.   No murmur heard. Pulmonary/Chest: Effort normal and breath sounds normal. No respiratory distress. She has no wheezes. She has no rales. She exhibits no tenderness.  Abdominal: Soft. Bowel sounds are normal.  Mild right lower abdominal pain and left lower abdominal pain. No guarding no rebound          Assessment & Plan:

## 2014-02-26 NOTE — Patient Instructions (Signed)
Please complete your stool samples and return at your earliest convenience. Call if you develop recurrent diarrhea.

## 2014-02-26 NOTE — Assessment & Plan Note (Signed)
Improved.  She has one more day of abx left. Health department is requesting that she complete repeat stool study 48hrs after completion of antibiotics, and another one 24 hours after the first sample. Will order. Per health department she is to wait until both of these are back and negative before she can be cleared to return to work. Will also obtain follow up cbc to ensure wbc has continues to trend downward.

## 2014-03-02 ENCOUNTER — Telehealth: Payer: Self-pay | Admitting: *Deleted

## 2014-03-02 NOTE — Telephone Encounter (Signed)
Pt came in Lab today to give first stool sample for Gastrointestinal Pathogen Panel PCR. Pt requesting that her results be called in to Laney Potashammy Koonce [facility] . [phone]. So that she could be cleared .Thanks.

## 2014-03-03 ENCOUNTER — Ambulatory Visit (INDEPENDENT_AMBULATORY_CARE_PROVIDER_SITE_OTHER): Payer: Medicare Other | Admitting: Nurse Practitioner

## 2014-03-03 ENCOUNTER — Encounter: Payer: Self-pay | Admitting: Nurse Practitioner

## 2014-03-03 VITALS — BP 126/60 | HR 94 | Ht 62.0 in | Wt 174.2 lb

## 2014-03-03 DIAGNOSIS — K2270 Barrett's esophagus without dysplasia: Secondary | ICD-10-CM

## 2014-03-03 DIAGNOSIS — A058 Other specified bacterial foodborne intoxications: Secondary | ICD-10-CM

## 2014-03-03 DIAGNOSIS — A044 Other intestinal Escherichia coli infections: Secondary | ICD-10-CM

## 2014-03-03 LAB — GASTROINTESTINAL PATHOGEN PANEL PCR
C. difficile Tox A/B, PCR: NEGATIVE
Campylobacter, PCR: NEGATIVE
Cryptosporidium, PCR: NEGATIVE
E COLI (ETEC) LT/ST, PCR: NEGATIVE
E coli (STEC) stx1/stx2, PCR: NEGATIVE
E coli 0157, PCR: NEGATIVE
Giardia lamblia, PCR: NEGATIVE
NOROVIRUS, PCR: NEGATIVE
ROTAVIRUS, PCR: NEGATIVE
SALMONELLA, PCR: NEGATIVE
SHIGELLA, PCR: NEGATIVE

## 2014-03-03 MED ORDER — HYDROCORTISONE 2.5 % RE CREA: TOPICAL_CREAM | RECTAL | Status: DC

## 2014-03-03 NOTE — Patient Instructions (Signed)
We sent a prescription for Anusol HC 2.5% cream to [facility], [place]. Use a small amount just inside and outside for hemorrhoids.  Call us if you have any more problems. [phone].

## 2014-03-03 NOTE — Progress Notes (Signed)
Reviewed. Please have pt follow up with me in the office with regards to Barrett's follow up as well as resolution of infectious colitis.

## 2014-03-03 NOTE — Progress Notes (Signed)
HPI :  Patient is a 73 year old female known to Dr. Juanda ChanceBrodie. She has a history of adenomatous colon polyps. Her last surveillance colonoscopy November 2008 was normal except for mild sigmoid diverticulosis. Patient was hospitalized earlier this month with bloody diarrhea and abdominal pain. CT scan revealed extensive colitis, stool positive for Escherichia Coli 0157. Patient was treated with antibiotics and advised by Hospitalist to followup with us. Patient works in [employer]. The Health Department has contacted her about e.coli. hey are requiring 2 stool samples, 24 hours apart after completion of antibiotics. Her first sample has already been collected by PCP. Patient feels much better. She is not having any abdominal pain. Stool is the consistency of pudding. She's had about 4 bowel movements a day. No fevers. Appetite is so-so.  Past Medical History  Diagnosis Date  . Hyperlipidemia   . Hypertension   . Osteoporosis     pt cannot tolerate fosamax  . Arthritis     osteoarthritis  . Barrett's esophagus   . GERD (gastroesophageal reflux disease)   . Chronic obstructive bronchitis     Dr Delford FieldWright  . IFG (impaired fasting glucose)   . Colon polyps   . History of nephrolithiasis   . History of chicken pox   . Positive PPD     exposure to 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 Positive PPD only, negative CXR.  [name]. History of transfusion of packed red blood cells   . Urinary incontinence   . Colitis     Family History  Problem Relation Age of Onset  . Asthma Mother   . Coronary artery disease    . Hyperlipidemia    . Hypertension    . Arthritis    . Tuberculosis     History  Substance Use Topics  . Smoking status: Former Smoker -- 15 years    Quit date: 05/15/1967  . Smokeless tobacco: Never Used  . Alcohol Use: No   Current Outpatient Prescriptions  Medication Sig Dispense Refill  . acetaminophen-codeine (TYLENOL #3) 300-30 MG per tablet Take 1 tablet by mouth every 8 (eight)  hours as needed for moderate pain.  30 tablet  0  . amLODipine (NORVASC) 5 MG tablet Take 1 tablet (5 mg total) by mouth daily.  90 tablet  1  . aspirin 325 MG tablet Take 325 mg by mouth daily.        . beta carotene 15 MG capsule Take 15 mg by mouth daily.        . bifidobacterium infantis (ALIGN) capsule Take 1 capsule by mouth daily.        . Calcium Carbonate-Vitamin D (CALTRATE 600+D) 600-400 MG-UNIT per tablet Take 1 tablet by mouth 2 (two) times daily.      . carboxymethylcellulose (REFRESH PLUS) 0.5 % SOLN Place 1 drop into both eyes 2 (two) times daily.      . Coenzyme Q10 200 MG capsule Take 200 mg by mouth daily.        [name]. ezetimibe (ZETIA) 10 MG tablet Take 1 tablet (10 mg total) by mouth daily.  90 tablet  1  . fluticasone (FLONASE) 50 MCG/ACT nasal spray Place 2 sprays into both nostrils daily as needed. Each nostril.  1 g  5  . metroNIDAZOLE (FLAGYL) 500 MG tablet Take 1 tablet (500 mg total) by mouth every 8 (eight) hours.  29 tablet  0  . Multiple Vitamin (MULTIVITAMIN) tablet Take 1 tablet by mouth daily.        .[name]  niacin 500 MG tablet Take 1,500 mg by mouth at bedtime.      . Omega-3 Fatty Acids (FISH OIL) 1000 MG CAPS Take 1 capsule by mouth every morning.       [name]. omeprazole (PRILOSEC) 20 MG capsule Take 20 mg by mouth See admin instructions. Takes 1 cap daily and then 1 cap as needed      . potassium chloride (MICRO-K) 10 MEQ CR capsule Take 1 capsule (10 mEq total) by mouth daily.  90 capsule  1  . quinapril-hydrochlorothiazide (ACCURETIC) 20-25 MG per tablet Take 1 tablet by mouth daily.  90 tablet  1  . vitamin E (VITAMIN E) 400 UNIT capsule Take 400 Units by mouth daily.        . Ascorbic Acid (VITAMIN C) 1000 MG tablet Take 1,000 mg by mouth daily.       No current facility-administered medications for this visit.   Allergies  Allergen Reactions  . Atorvastatin Other (See Comments)    REACTION: MUSCLE PAINS  . Diclofenac-Misoprostol Diarrhea and Nausea And Vomiting    . Fenofibrate Other (See Comments)    REACTION: carpopedal spasms  . Rosuvastatin Other (See Comments)    REACTION: MUSCLE PAINS  . Statins Other (See Comments)    myalgias  . Amoxicillin-Pot Clavulanate Other (See Comments)    REACTION: unspecified  . Penicillins Hives  . Sulfonamide Derivatives Hives  . Tuberculin Ppd Other (See Comments)    unknown     Review of Systems: Positive for arthritis, back pain and cough. All other systems reviewed and negative except where noted in HPI.    Ct Abdomen Pelvis W Contrast  02/10/2014   CLINICAL DATA:  Abdominal pain with right lower quadrant tenderness. Nausea. Diarrhea for 3 days. Hysterectomy. Appendectomy. Cholecystectomy.  EXAM: CT ABDOMEN AND PELVIS WITH CONTRAST  TECHNIQUE: Multidetector CT imaging of the abdomen and pelvis was performed using the standard protocol following bolus administration of intravenous contrast.  CONTRAST:  50mL OMNIPAQUE IOHEXOL 300 MG/ML SOLN, 100mL OMNIPAQUE IOHEXOL 300 MG/ML SOLN  COMPARISON:  10/15/2011  FINDINGS: Lower chest: Clear lung bases. Mild cardiomegaly with coronary artery atherosclerosis. A small hiatal hernia with contrast filled lower thoracic esophagus.  Hepatobiliary: Moderate hepatic steatosis, without focal liver lesion. Cholecystectomy, without biliary ductal dilatation.  Pancreas: Low-density in the pancreatic body on image 27 is secondary to interdigitation of peripancreatic fat.  Spleen: Normal  Adrenals/Urinary Tract: Scarring or a too small to characterize lesion in the interpolar left kidney. Normal right kidney. Normal adrenal glands. Normal urinary bladder.  Stomach/Bowel: Normal distal stomach. Sigmoid diverticulosis. Moderate wall thickening of and edema surrounding the ascending and transverse colon. No pneumatosis or free intraperitoneal air. Normal terminal ileum. Appendectomy.  Normal small bowel loops.  Vascular/Lymphatic: Moderate aortic and branch vessel atherosclerosis. No  evidence of mesenteric embolism. The inferior mesenteric artery is patent proximally. No retroperitoneal or retrocrural adenopathy. Small ileocolic mesenteric nodes are likely reactive. No pelvic adenopathy.  Reproductive: Hysterectomy. Mild pelvic floor laxity. No adnexal mass. Trace abdominal and small volume pelvic cul-de-sac fluid. No collection to suggest abscess.  Other:  None  Musculoskeletal: Right hip arthroplasty. Degenerative disc disease, including at L3 through S1.  IMPRESSION: 1. Extensive colitis. Distribution favors infection. Recommend clinical exclusion of C difficile colitis. Ischemia and inflammatory bowel disease felt less likely, given distribution. 2. Small volume abdominal pelvic ascites, presumably secondary. 3.  Atherosclerosis, including within the coronary arteries. 4. Small hiatal hernia. Esophageal air fluid level suggests dysmotility or gastroesophageal  reflux. 5. Mild hepatic steatosis.   Electronically Signed   By: Jeronimo Greaves M.D.   On: 02/10/2014 11:52    Physical Exam: BP 126/60  Pulse 94  Ht 5\' 2"  (1.575 m)  Wt 174 lb 3.2 oz (79.017 kg)  BMI 31.85 kg/m2  SpO2 96%  LMP 05/14/1968 Constitutional: Pleasant,well-developed, white female in no acute distress. HEENT: Normocephalic and atraumatic. Conjunctivae are normal. No scleral icterus. Neck supple.  Cardiovascular: Normal rate, regular rhythm.  Pulmonary/chest: Effort normal and breath sounds normal. No wheezing, rales or rhonchi. Abdominal: Soft, nondistended, nontender. Bowel sounds active throughout. There are no masses palpable. No hepatomegaly. Extremities: no edema Lymphadenopathy: No cervical adenopathy noted. Neurological: Alert and oriented to person place and time. Skin: Skin is warm and dry. No rashes noted. Psychiatric: Normal mood and affect. Behavior is normal.   ASSESSMENT AND PLAN:  49. 73 year old female with recent infectious colitis. Stool tested positive for Escherichia coli 0157.  Patient is much better after completion of antibiotics. She is in the process of completing repeat stool studies for the health department.  2. Biopsy proven Barrett's esophagus. Hisologic findings of Barrett's on last EGD 2009. Will defer to Dr. Juanda Chance as to whether patient needs any further surveillance endoscopies

## 2014-03-04 ENCOUNTER — Telehealth: Payer: Self-pay | Admitting: *Deleted

## 2014-03-04 LAB — GASTROINTESTINAL PATHOGEN PANEL PCR
C. DIFFICILE TOX A/B, PCR: NEGATIVE
CAMPYLOBACTER, PCR: NEGATIVE
Cryptosporidium, PCR: NEGATIVE
E COLI (STEC) STX1/STX2, PCR: NEGATIVE
E coli (ETEC) LT/ST PCR: NEGATIVE
E coli 0157, PCR: NEGATIVE
GIARDIA LAMBLIA, PCR: NEGATIVE
Norovirus, PCR: NEGATIVE
ROTAVIRUS, PCR: NEGATIVE
Salmonella, PCR: NEGATIVE
Shigella, PCR: NEGATIVE

## 2014-03-04 NOTE — Telephone Encounter (Signed)
Left a message for patient to call back. 

## 2014-03-04 NOTE — Telephone Encounter (Signed)
Spoke with patient and scheduled on 03/23/14 at 2:00 PM.

## 2014-03-04 NOTE — Telephone Encounter (Signed)
Message copied by Daphine DeutscherMILLER, Yanixan Mellinger N on Thu Mar 04, 2014  1:21 PM ------      Message from: Meredith PelGUENTHER, PAULA M      Created: Thu Mar 04, 2014  1:12 PM       Rene Kocheregina, please call and tell her Juanda ChanceBrodie wants follow up with her regarding Barrett''s. Thanks      ----- Message -----         From: Hart Carwinora M Brodie, MD         Sent: 03/03/2014   7:09 PM           To: Meredith PelPaula M Guenther, NP                        ----- Message -----         From: Meredith PelPaula M Guenther, NP         Sent: 03/03/2014   5:17 PM           To: Hart Carwinora M Brodie, MD                   ------

## 2014-03-05 NOTE — Telephone Encounter (Signed)
Result faxed to below # and detailed message left on pt's home #.

## 2014-03-23 ENCOUNTER — Ambulatory Visit (INDEPENDENT_AMBULATORY_CARE_PROVIDER_SITE_OTHER): Payer: Medicare Other | Admitting: Internal Medicine

## 2014-03-23 ENCOUNTER — Encounter: Payer: Self-pay | Admitting: Internal Medicine

## 2014-03-23 VITALS — BP 130/78 | HR 88 | Ht 62.0 in | Wt 175.5 lb

## 2014-03-23 DIAGNOSIS — K2270 Barrett's esophagus without dysplasia: Secondary | ICD-10-CM

## 2014-03-23 NOTE — Patient Instructions (Signed)
You have been scheduled for an endoscopy. Please follow written instructions given to you at your visit today. If you use inhalers (even only as needed), please bring them with you on the day of your procedure. Your physician has requested that you go to www.startemmi.com and enter the access code given to you at your visit today. This web site gives a general overview about your procedure. However, you should still follow specific instructions given to you by our office regarding your preparation for the procedure.  CC:Dr Sandford CrazeMelissa O'Sullivan

## 2014-03-23 NOTE — Progress Notes (Signed)
Patient ID: Miranda DickSylvia [name] [name], female   DOB: [name], [name] [name], 73 y.o.   MRN: [id number]  Shon HoughSylvia [name] [name] [name], [name] [name] [id number]  Note: This dictation was prepared with Dragon digital system. Any transcriptional errors that result from this procedure are unintentional.   History of Present Illness:  This is a This is a 73 year old white female who just recovered from Escherichia coli colitis requiring hospitalization. She is doing very well and is here to schedule an upper endoscopy for follow-up of Barrett's esophagus. 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 She is well controlled on omeprazole 20 mg twice a day.      Past Medical History  Diagnosis Date  . Hyperlipidemia   . Hypertension   . Osteoporosis     pt cannot tolerate fosamax  . Arthritis     osteoarthritis  . Barrett's esophagus   . GERD (gastroesophageal reflux disease)   . Chronic obstructive bronchitis     Dr Delford FieldWright  . IFG (impaired fasting glucose)   . Colon polyps   . History of nephrolithiasis   . History of chicken pox   . Positive PPD     exposure to 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 Positive PPD only, negative CXR.  [name]. History of transfusion of packed red blood cells   . Urinary incontinence   . Colitis     Past Surgical History  Procedure Laterality Date  . Abdominal hysterectomy  1970  . Appendectomy    . Cholecystectomy    . Total knee arthroplasty  04/2006    total left knee replacement  . Leg surgery  2008    right    Allergies  Allergen Reactions  . Atorvastatin Other (See Comments)    REACTION: MUSCLE PAINS  . Diclofenac-Misoprostol Diarrhea and Nausea And Vomiting  . Fenofibrate Other (See Comments)    REACTION: carpopedal spasms  . Rosuvastatin Other (See Comments)    REACTION: MUSCLE PAINS  . Statins Other (See Comments)    myalgias  . Amoxicillin-Pot Clavulanate Other (See Comments)    REACTION: unspecified  . Penicillins Hives  . Sulfonamide  Derivatives Hives  . Tuberculin Ppd Other (See Comments)    unknown    Family history and social history have been reviewed.  Review of Systems:   The remainder of the 10 point ROS is negative except as outlined in the H&P  Physical Exam: General Appearance Well developed, in no distress Eyes  Non icteric  HEENT  Non traumatic, normocephalic  Mouth No lesion, tongue papillated, no cheilosis Neck Supple without adenopathy, thyroid not enlarged, no carotid bruits, no JVD Lungs Clear to auscultation bilaterally COR Normal S1, normal S2, regular rhythm, no murmur, quiet precordium Abdomen soft, quiet bowel sounds. Very tender right lower quadrant and left lower quadrant. No rebound or fullness Rectal not done. Extremities  No pedal edema Skin No lesions Neurological Alert and oriented x 3 Psychological Normal mood and affect  Assessment and Plan:   Problem #1 Complete resolution of Escherichia coli colitis. Patient still has residual tenderness. Her bowel habits are back to normal. She will continue probiotics and a low-residue diet.  Problem #2 History of Barrett's esophagus. She will continue on omeprazole 20 mg twice a day. We will schedule an upper endoscopy and biopsies.  Lina SarDora Jama Krichbaum 03/23/2014

## 2014-05-10 ENCOUNTER — Telehealth: Payer: Self-pay | Admitting: Internal Medicine

## 2014-05-10 NOTE — Telephone Encounter (Signed)
Spoke with pt.  She states this sore throat started Saturday.  She denies fever or chills.  I told her she was ok to have procedure per our policy.  Understanding voiced

## 2014-05-11 ENCOUNTER — Ambulatory Visit (AMBULATORY_SURGERY_CENTER): Payer: Medicare Other | Admitting: Internal Medicine

## 2014-05-11 ENCOUNTER — Encounter: Payer: Self-pay | Admitting: Internal Medicine

## 2014-05-11 VITALS — BP 144/81 | HR 82 | Temp 98.1°F | Resp 17 | Ht 62.0 in | Wt 175.0 lb

## 2014-05-11 DIAGNOSIS — K2270 Barrett's esophagus without dysplasia: Secondary | ICD-10-CM

## 2014-05-11 MED ORDER — SODIUM CHLORIDE 0.9 % IV SOLN: 500.0000 mL | INTRAVENOUS | Status: DC

## 2014-05-11 NOTE — Op Note (Signed)
[facility] [address] [place], [id number]   ENDOSCOPY PROCEDURE REPORT  PATIENT: [name], [name]  MR#: [id number] BIRTHDATE: Oct 26, 1940 , 73  yrs. 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 Patient is having breakthrough symptoms on PPIs. MEDICATIONS: Monitored anesthesia care and Propofol 200 mg IV TOPICAL ANESTHETIC: none  DESCRIPTION OF PROCEDURE: After the risks benefits and alternatives of the procedure were thoroughly explained, informed consent was obtained.  The LB [id number] endoscope was introduced through the mouth and advanced to the second portion of the duodenum , Without limitations.  The instrument was slowly withdrawn as the mucosa was fully examined.    Esophagus: proximal, mid and distal esophageal mucosa was unremarkable. There was no evidence of esophagitis. There was a benign-appearing nonobstructing fibrous ring at the GE junction which allowed the endoscope to traverse into bilateral hernia. Multiple biopsies were taken to rule out Barrett's esophagus Stomach: there was a 3-4 cm nonreducible hiatal hernia There were no Cameron erosions. Gastric antrum and pyloric outlet were normal. There was minimal erythema in the gastric antrum. Retroflexion of endoscope confirmed presence of hiatal hernia Duodenum: duodenal bulb and descending duodenum was normal: [ The scope was then withdrawn from the patient and the procedure completed.  COMPLICATIONS: There were no immediate complications.  ENDOSCOPIC IMPRESSION: 1. non-obstructing esophageal stricture 2. 3 cm nonreducible hiatal hernia 3. History of Barrett's esophagus. Biopsies obtained from  GE junction  RECOMMENDATIONS: 1.  Await pathology results 2.  Anti-reflux regimen to be follow 3.  Continue PPI  REPEAT EXAM: for EGD pending biopsy results.  eSigned:  Hart Carwinora M Tyric Rodeheaver, MD 05/11/2014 3:33 PM    CC:  PATIENT NAME:  [name], [name] MR#: [id number]

## 2014-05-11 NOTE — Patient Instructions (Signed)
Discharge instructions given. Biopsies taken. Resume previous medications. YOU HAD AN ENDOSCOPIC PROCEDURE TODAY AT THE [facility]: Refer to the procedure report that was given to you for any specific questions about what was found during the examination.  If the procedure report does not answer your questions, please call your gastroenterologist to clarify.  If you requested that your care partner not be given the details of your procedure findings, then the procedure report has been included in a sealed envelope for you to review at your convenience later.  YOU SHOULD EXPECT: Some feelings of bloating in the abdomen. Passage of more gas than usual.  Walking can help get rid of the air that was put into your GI tract during the procedure and reduce the bloating. If you had a lower endoscopy (such as a colonoscopy or flexible sigmoidoscopy) you may notice spotting of blood in your stool or on the toilet paper. If you underwent a bowel prep for your procedure, then you may not have a normal bowel movement for a few days.  DIET: Your first meal following the procedure should be a light meal and then it is ok to progress to your normal diet.  A half-sandwich or bowl of soup is an example of a good first meal.  Heavy or fried foods are harder to digest and may make you feel nauseous or bloated.  Likewise meals heavy in dairy and vegetables can cause extra gas to form and this can also increase the bloating.  Drink plenty of fluids but you should avoid alcoholic beverages for 24 hours.  ACTIVITY: Your care partner should take you home directly after the procedure.  You should plan to take it easy, moving slowly for the rest of the day.  You can resume normal activity the day after the procedure however you should NOT DRIVE or use heavy machinery for 24 hours (because of the sedation medicines used during the test).    SYMPTOMS TO REPORT IMMEDIATELY: A gastroenterologist can be reached at any  hour.  During normal business hours, 8:30 AM to 5:00 PM Monday through Friday, call [phone].  After hours and on weekends, please call the GI answering service at [phone] who will take a message and have the physician on call contact you.   Following upper endoscopy (EGD)  Vomiting of blood or coffee ground material  New chest pain or pain under the shoulder blades  Painful or persistently difficult swallowing  New shortness of breath  Fever of 100F or higher  Black, tarry-looking stools  FOLLOW UP: If any biopsies were taken you will be contacted by phone or by letter within the next 1-3 weeks.  Call your gastroenterologist if you have not heard about the biopsies in 3 weeks.  Our staff will call the home number listed on your records the next business day following your procedure to check on you and address any questions or concerns that you may have at that time regarding the information given to you following your procedure. This is a courtesy call and so if there is no answer at the home number and we have not heard from you through the emergency physician on call, we will assume that you have returned to your regular daily activities without incident.  SIGNATURES/CONFIDENTIALITY: You and/or your care partner have signed paperwork which will be entered into your electronic medical record.  These signatures attest to the fact that that the information above on your After Visit Summary has   been reviewed and is understood.  Full responsibility of the confidentiality of this discharge information lies with you and/or your care-partner. 

## 2014-05-11 NOTE — Progress Notes (Signed)
Called to room to assist during endoscopic procedure.  Patient ID and intended procedure confirmed with present staff. Received instructions for my participation in the procedure from the performing physician.  

## 2014-05-11 NOTE — Progress Notes (Signed)
Report to PACU, RN, vss, BBS= Clear.  

## 2014-05-12 ENCOUNTER — Telehealth: Payer: Self-pay

## 2014-05-12 NOTE — Telephone Encounter (Signed)
  Follow up Call-  Call back number 05/11/2014  Post procedure Call Back phone  # [phone]  Permission to leave phone message Yes     Patient questions:  Do you have a fever, pain , or abdominal swelling? No. Pain Score  0 *  Have you tolerated food without any problems? Yes.    Have you been able to return to your normal activities? Yes.    Do you have any questions about your discharge instructions: Diet   No. Medications  No. Follow up visit  No.  Do you have questions or concerns about your Care? No.  Actions: * If pain score is 4 or above: No action needed, pain <4.

## 2014-05-17 ENCOUNTER — Ambulatory Visit: Payer: Medicare Other | Admitting: Family

## 2014-05-18 ENCOUNTER — Encounter: Payer: Self-pay | Admitting: Internal Medicine

## 2014-06-03 ENCOUNTER — Ambulatory Visit: Payer: Self-pay | Admitting: Family

## 2014-06-10 ENCOUNTER — Other Ambulatory Visit: Payer: Self-pay | Admitting: Family

## 2014-06-10 NOTE — Telephone Encounter (Signed)
Amlodipine Last filled:  11/17/13 Amt: 90, 1 refill  Quinapril-hydrochlorothazide Last filled:  11/17/13 Amt: 90, 1 refill  Omeprazole Last filled: 11/17/13 Amt: 180, 1 refill  Potassium Chloride Last filled:  11/17/13 Amt: 90, 1 refill  Last OV:  02/26/14  Next appt scheduled for 06/15/14.  Will fill med at next appointment.

## 2014-06-10 NOTE — Telephone Encounter (Signed)
Caller name: [name] Relation to pt: Call back number: [phone] or [phone] Pharmacy: [facility] to [facility]  Reason for call:  Pt came in office stating for refill BP med  Amlodipine (Norvasc) 5mg 1 table and Quinapril-Hydrochlorothlazide (accuretic) 20-25-mg  1 tablet daily for 3 months and her other rx Omeprazole (Prilosec) 20 mg 2 Capsule daily and Potassium chloride (micro-It) 10 MEQCR 1 capsule daily are due to refill. Pt informed that they will be for [facility]. Please advise.

## 2014-06-11 MED ORDER — OMEPRAZOLE 20 MG PO CPDR: 20.0000 mg | DELAYED_RELEASE_CAPSULE | ORAL | Status: DC

## 2014-06-11 MED ORDER — QUINAPRIL-HYDROCHLOROTHIAZIDE 20-25 MG PO TABS: 1.0000 | ORAL_TABLET | Freq: Every day | ORAL | Status: DC

## 2014-06-11 MED ORDER — AMLODIPINE BESYLATE 5 MG PO TABS: 5.0000 mg | ORAL_TABLET | Freq: Every day | ORAL | Status: DC

## 2014-06-11 MED ORDER — POTASSIUM CHLORIDE ER 10 MEQ PO CPCR: 10.0000 meq | ORAL_CAPSULE | Freq: Every day | ORAL | Status: DC

## 2014-06-11 NOTE — Telephone Encounter (Signed)
Refills sent

## 2014-06-15 ENCOUNTER — Ambulatory Visit (INDEPENDENT_AMBULATORY_CARE_PROVIDER_SITE_OTHER): Payer: Medicare Other | Admitting: Family

## 2014-06-15 ENCOUNTER — Encounter: Payer: Self-pay | Admitting: Family

## 2014-06-15 VITALS — BP 126/78 | HR 95 | Temp 97.7°F | Resp 18 | Ht 62.0 in | Wt 180.0 lb

## 2014-06-15 DIAGNOSIS — M810 Age-related osteoporosis without current pathological fracture: Secondary | ICD-10-CM

## 2014-06-15 DIAGNOSIS — E7800 Pure hypercholesterolemia, unspecified: Secondary | ICD-10-CM

## 2014-06-15 DIAGNOSIS — K219 Gastro-esophageal reflux disease without esophagitis: Secondary | ICD-10-CM

## 2014-06-15 DIAGNOSIS — I10 Essential (primary) hypertension: Secondary | ICD-10-CM

## 2014-06-15 DIAGNOSIS — R739 Hyperglycemia, unspecified: Secondary | ICD-10-CM

## 2014-06-15 NOTE — Assessment & Plan Note (Signed)
Continue niacin/fish oil and coenzyme q10, declines follow up lipids. Intolerant to statins.

## 2014-06-15 NOTE — Assessment & Plan Note (Signed)
+   hx of femur fracture while on fosamax, declines further dexa scans or further meds for osteoporosis, continue calcium.

## 2014-06-15 NOTE — Assessment & Plan Note (Signed)
>>  ASSESSMENT AND PLAN FOR ESSENTIAL HYPERTENSION WRITTEN ON 06/15/2014  3:49 PM BY O'SULLIVAN, Farrell Broerman, NP  BP stable on current meds.

## 2014-06-15 NOTE — Progress Notes (Signed)
Subjective:    Patient ID: [name], female    DOB: [birth year], 74 y.o.   MRN: [id number]  HPI  Patient here for follow up.  HTN- maintained on amlodipine.  BP Readings from Last 3 Encounters:  06/15/14 126/78  05/11/14 144/81  03/23/14 130/78   Hyperlipidemia- on niacin, fish oil and co-enzyme Q10. Lab Results  Component Value Date   CHOL 244* 11/17/2013   HDL 55 11/17/2013   LDLCALC 159* 11/17/2013   LDLDIRECT 158.8 06/10/2008   TRIG 150* 11/17/2013   CHOLHDL 4.4 11/17/2013   GERD- reports symptoms well controlled on PPI.    Osteoporosis- hx of femur fracture, declines further dexa or treatment. Takes calcium.  Past Medical History  Diagnosis Date  . Hyperlipidemia   . Hypertension   . Osteoporosis     pt cannot tolerate fosamax  . Arthritis     osteoarthritis  . Barrett's esophagus   . GERD (gastroesophageal reflux disease)   . Chronic obstructive bronchitis     Dr Delford Field  . IFG (impaired fasting glucose)   . Colon polyps   . History of nephrolithiasis   . History of chicken pox   . Positive PPD     exposure to 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 Positive PPD only, negative CXR.  [name] History of transfusion of packed red blood cells   . Urinary incontinence   . Colitis   . Chronic kidney disease     kidney stones  . Cataract     bilateral cateracts removed    Review of Systems  Respiratory: Negative for shortness of breath.   Cardiovascular: Negative for chest pain and leg swelling.   Past Medical History  Diagnosis Date  . Hyperlipidemia   . Hypertension   . Osteoporosis     pt cannot tolerate fosamax  . Arthritis     osteoarthritis  . Barrett's esophagus   . GERD (gastroesophageal reflux disease)   . Chronic obstructive bronchitis     Dr Delford Field  . IFG (impaired fasting glucose)   . Colon polyps   . History of nephrolithiasis   . History of chicken pox   . Positive PPD     exposure to 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 Positive PPD only, negative CXR.  [name]  History of transfusion of packed red blood cells   . Urinary incontinence   . Colitis   . Chronic kidney disease     kidney stones  . Cataract     bilateral cateracts removed    History   Social History  . Marital Status: Widowed    Spouse Name: N/A    Number of Children: N/A  . Years of Education: N/A   Occupational History  . Retired    Social History Main Topics  . Smoking status: Former Smoker -- 15 years    Quit date: 05/15/1967  . Smokeless tobacco: Never Used  . Alcohol Use: No  . Drug Use: No  . Sexual Activity: Not on file   Other Topics Concern  . Not on file   Social History Narrative   Regular exercise: yes    Past Surgical History  Procedure Laterality Date  . Abdominal hysterectomy  1970  . Appendectomy    . Cholecystectomy    . Total knee arthroplasty  04/2006    total left knee replacement  . Leg surgery  2008    right    Family History  Problem Relation Age of Onset  . Coronary artery disease Mother   .  Arthritis Mother   . Tuberculosis    . Asthma Maternal Grandmother   . Colon cancer Neg Hx   . Esophageal cancer Neg Hx   . Rectal cancer Neg Hx   . Stomach cancer Neg Hx     Allergies  Allergen Reactions  . Atorvastatin Other (See Comments)    REACTION: MUSCLE PAINS  . Diclofenac-Misoprostol Diarrhea and Nausea And Vomiting  . Fenofibrate Other (See Comments)    REACTION: carpopedal spasms  . Rosuvastatin Other (See Comments)    REACTION: MUSCLE PAINS  . Statins Other (See Comments)    myalgias  . Amoxicillin-Pot Clavulanate Other (See Comments)    REACTION: unspecified  . Penicillins Hives  . Sulfonamide Derivatives Hives  . Tuberculin Ppd Other (See Comments)    Redness at sight    Current Outpatient Prescriptions on File Prior to Visit  Medication Sig Dispense Refill  . amLODipine (NORVASC) 5 MG tablet Take 1 tablet (5 mg total) by mouth daily. 90 tablet 1  . Ascorbic Acid (VITAMIN C) 1000 MG tablet Take 1,000 mg by  mouth daily.    [name] aspirin 325 MG tablet Take 325 mg by mouth daily.      . beta carotene 15 MG capsule Take 15 mg by mouth daily.      . bifidobacterium infantis (ALIGN) capsule Take 1 capsule by mouth daily.      . Calcium Carbonate-Vitamin D (CALTRATE 600+D) 600-400 MG-UNIT per tablet Take 1 tablet by mouth 2 (two) times daily.    . carboxymethylcellulose (REFRESH PLUS) 0.5 % SOLN Place 1 drop into both eyes 2 (two) times daily.    . Coenzyme Q10 200 MG capsule Take 200 mg by mouth daily.      . hydrocortisone (ANUSOL-HC) 2.5 % rectal cream Place 1 application rectally 2 (two) times daily. Use a smal amount inside and outside the rectum for hemorrhoids    . Multiple Vitamin (MULTIVITAMIN) tablet Take 1 tablet by mouth daily.      . niacin 500 MG tablet Take 1,500 mg by mouth at bedtime.    . Omega-3 Fatty Acids (FISH OIL) 1000 MG CAPS Take 1 capsule by mouth every morning.     [name] omeprazole (PRILOSEC) 20 MG capsule Take 1 capsule (20 mg total) by mouth See admin instructions. Takes 1 cap daily and then 1 cap as needed 180 capsule 1  . potassium chloride (MICRO-K) 10 MEQ CR capsule Take 1 capsule (10 mEq total) by mouth daily. 90 capsule 1  . quinapril-hydrochlorothiazide (ACCURETIC) 20-25 MG per tablet Take 1 tablet by mouth daily. 90 tablet 1  . vitamin E (VITAMIN E) 400 UNIT capsule Take 400 Units by mouth daily.       No current facility-administered medications on file prior to visit.    BP 126/78 mmHg  Pulse 95  Temp(Src) 97.7 F (36.5 C) (Oral)  Resp 18  Ht  (1.575 m)  Wt 180 lb (81.647 kg)  BMI 32.91 kg/m2  SpO2 99%  LMP 05/14/1968       Objective:    Physical Exam  Constitutional: She is oriented to person, place, and time. She appears well-developed and well-nourished.  Cardiovascular: Normal rate, regular rhythm and normal heart sounds.   No murmur heard. Pulmonary/Chest: Effort normal and breath sounds normal. No respiratory distress. She has no wheezes.    Musculoskeletal: She exhibits no edema.  Neurological: She is alert and oriented to person, place, and time.  Psychiatric: She has a normal  mood and affect. Her behavior is normal. Judgment and thought content normal.    BP 126/78 mmHg  Pulse 95  Temp(Src) 97.7 F (36.5 C) (Oral)  Resp 18  Ht 5\' 2"  (1.575 m)  Wt 180 lb (81.647 kg)  BMI 32.91 kg/m2  SpO2 99%  LMP 05/14/1968 Wt Readings from Last 3 Encounters:  06/15/14 180 lb (81.647 kg)  05/11/14 175 lb (79.379 kg)  03/23/14 175 lb 8 oz (79.606 kg)     Lab Results  Component Value Date   WBC 10.0 02/26/2014   HGB 13.0 02/26/2014   HCT 40.0 02/26/2014   PLT 623.0* 02/26/2014   GLUCOSE 118* 02/26/2014   CHOL 244* 11/17/2013   TRIG 150* 11/17/2013   HDL 55 11/17/2013   LDLDIRECT 158.8 06/10/2008   LDLCALC 159* 11/17/2013   ALT 34 02/20/2014   AST 32 02/20/2014   NA 135 02/26/2014   K 4.1 02/26/2014   CL 100 02/26/2014   CREATININE 0.7 02/26/2014   BUN 8 02/26/2014   CO2 22 02/26/2014   TSH 2.25 06/10/2008   INR 1.07 02/11/2014   HGBA1C 5.9* 11/17/2013    Ct Abdomen Pelvis W Contrast  02/10/2014   CLINICAL DATA:  Abdominal pain with right lower quadrant tenderness. Nausea. Diarrhea for 3 days. Hysterectomy. Appendectomy. Cholecystectomy.  EXAM: CT ABDOMEN AND PELVIS WITH CONTRAST  TECHNIQUE: Multidetector CT imaging of the abdomen and pelvis was performed using the standard protocol following bolus administration of intravenous contrast.  CONTRAST:  50mL OMNIPAQUE IOHEXOL 300 MG/ML SOLN, 100mL OMNIPAQUE IOHEXOL 300 MG/ML SOLN  COMPARISON:  10/15/2011  FINDINGS: Lower chest: Clear lung bases. Mild cardiomegaly with coronary artery atherosclerosis. A small hiatal hernia with contrast filled lower thoracic esophagus.  Hepatobiliary: Moderate hepatic steatosis, without focal liver lesion. Cholecystectomy, without biliary ductal dilatation.  Pancreas: Low-density in the pancreatic body on image 27 is secondary to  interdigitation of peripancreatic fat.  Spleen: Normal  Adrenals/Urinary Tract: Scarring or a too small to characterize lesion in the interpolar left kidney. Normal right kidney. Normal adrenal glands. Normal urinary bladder.  Stomach/Bowel: Normal distal stomach. Sigmoid diverticulosis. Moderate wall thickening of and edema surrounding the ascending and transverse colon. No pneumatosis or free intraperitoneal air. Normal terminal ileum. Appendectomy.  Normal small bowel loops.  Vascular/Lymphatic: Moderate aortic and branch vessel atherosclerosis. No evidence of mesenteric embolism. The inferior mesenteric artery is patent proximally. No retroperitoneal or retrocrural adenopathy. Small ileocolic mesenteric nodes are likely reactive. No pelvic adenopathy.  Reproductive: Hysterectomy. Mild pelvic floor laxity. No adnexal mass. Trace abdominal and small volume pelvic cul-de-sac fluid. No collection to suggest abscess.  Other:  None  Musculoskeletal: Right hip arthroplasty. Degenerative disc disease, including at L3 through S1.  IMPRESSION: 1. Extensive colitis. Distribution favors infection. Recommend clinical exclusion of C difficile colitis. Ischemia and inflammatory bowel disease felt less likely, given distribution. 2. Small volume abdominal pelvic ascites, presumably secondary. 3.  Atherosclerosis, including within the coronary arteries. 4. Small hiatal hernia. Esophageal air fluid level suggests dysmotility or gastroesophageal reflux. 5. Mild hepatic steatosis.   Electronically Signed   By: Jeronimo GreavesKyle  Talbot M.D.   On: 02/10/2014 11:52       Assessment & Plan:   Problem List Items Addressed This Visit    None       Lemont Fillers'SULLIVAN,Purcell Jungbluth S., NP

## 2014-06-15 NOTE — Assessment & Plan Note (Signed)
BP stable on current meds.   

## 2014-06-15 NOTE — Assessment & Plan Note (Signed)
>>  ASSESSMENT AND PLAN FOR HYPERCHOLESTEROLEMIA WRITTEN ON 06/15/2014  3:49 PM BY O'SULLIVAN, Emonte Dieujuste, NP  Continue niacin /fish oil and coenzyme q10, declines follow up lipids. Intolerant to statins.

## 2014-06-15 NOTE — Patient Instructions (Addendum)
Please complete lab work prior to leaving. Follow up in 6 months for wellness visit.

## 2014-06-15 NOTE — Progress Notes (Signed)
Pre visit review using our clinic review tool, if applicable. No additional management support is needed unless otherwise documented below in the visit note. 

## 2014-06-15 NOTE — Assessment & Plan Note (Signed)
Stable on PPI, continue same.  

## 2014-06-16 ENCOUNTER — Telehealth: Payer: Self-pay | Admitting: Family

## 2014-06-16 LAB — HEMOGLOBIN A1C: Hgb A1c MFr Bld: 6 % (ref 4.6–6.5)

## 2014-06-16 NOTE — Telephone Encounter (Signed)
Spoke with [name] at [facility] and he states he does not see prior auth requirement at this time. States Quinapril Rx went through with no problem. Also states he received omeprazole rx and it is being processed. Spoke with pt and she states she was told [facility] would only be able to do 30 day supply of Quinapril as it would need PA. Pt states she is going to switch all rxs to [facility] in the future. Advised pt to call me once she receives rxs in the mail and let us know what quantity of quinapril she receives and we will go from there. Pt voices understanding.

## 2014-06-16 NOTE — Telephone Encounter (Signed)
Caller name:Osier [name] Relation to ZO:XWRUpt:self Call back number:[phone] Pharmacy:  Reason for call: pt states [facility] is requesting PA on   quinapril-hydrochlorothiazide (ACCURETIC) 20-25 MG per tablet    And pt needs a new rx for omeprazole (PRILOSEC) 20 MG capsule

## 2014-06-17 ENCOUNTER — Encounter: Payer: Self-pay | Admitting: Family

## 2014-06-30 ENCOUNTER — Telehealth: Payer: Self-pay | Admitting: Family

## 2014-06-30 NOTE — Telephone Encounter (Signed)
Refill sent 06/11/14, #90 x 1 refill. Notified pt.

## 2014-06-30 NOTE — Telephone Encounter (Signed)
Caller name: Carliyah Relation to pt: self Call back number: [phone] Pharmacy: [facility]  Reason for call:   Requesting 90 day supply of hctz

## 2014-07-07 ENCOUNTER — Ambulatory Visit (INDEPENDENT_AMBULATORY_CARE_PROVIDER_SITE_OTHER): Payer: Medicare Other | Admitting: Medical

## 2014-07-07 ENCOUNTER — Encounter: Payer: Self-pay | Admitting: Medical

## 2014-07-07 VITALS — BP 105/71 | HR 101 | Temp 98.1°F | Ht 62.0 in | Wt 172.2 lb

## 2014-07-07 DIAGNOSIS — R300 Dysuria: Secondary | ICD-10-CM

## 2014-07-07 DIAGNOSIS — R197 Diarrhea, unspecified: Secondary | ICD-10-CM | POA: Diagnosis not present

## 2014-07-07 DIAGNOSIS — R82998 Other abnormal findings in urine: Secondary | ICD-10-CM

## 2014-07-07 DIAGNOSIS — N390 Urinary tract infection, site not specified: Secondary | ICD-10-CM | POA: Diagnosis not present

## 2014-07-07 LAB — POCT URINALYSIS DIPSTICK
Glucose, UA: NEGATIVE
Ketones, UA: NEGATIVE
NITRITE UA: NEGATIVE
PH UA: 6
Protein, UA: 100
Spec Grav, UA: >=1.03
UROBILINOGEN UA: 1

## 2014-07-07 MED ORDER — CIPROFLOXACIN HCL 500 MG PO TABS: 500.0000 mg | ORAL_TABLET | Freq: Two times a day (BID) | ORAL | Status: DC

## 2014-07-07 MED ORDER — ONDANSETRON 8 MG PO TBDP: 8.0000 mg | ORAL_TABLET | Freq: Three times a day (TID) | ORAL | Status: DC | PRN

## 2014-07-07 NOTE — Patient Instructions (Signed)
UTI (urinary tract infection) Your appear to have a urinary tract infection. I am prescribing cipro  antibiotic for the probable infection. Hydrate well. I am sending out a urine culture. During the interim if your signs and symptoms worsen rather than improving please notify us. We will notify your when the culture results are back.  Follow up in 7 days or as needed.  Rx zofran for any nausea or vomiting.   Diarrhea Your GI symptoms seem to be improving per your report today. Bland diet guidelines. immodium otc. Your treatment for uti with cipro may promote mild loose stools. Get probiotic otc. If your diarrhea worsen again by Friday notify us and would recommend stool panel kit.    

## 2014-07-07 NOTE — Addendum Note (Signed)
Addended by: Lurline HareARTER, Yamileth Hayse E on: 07/07/2014 03:26 PM   Modules accepted: Orders

## 2014-07-07 NOTE — Assessment & Plan Note (Signed)
Your appear to have a urinary tract infection. I am prescribing cipro  antibiotic for the probable infection. Hydrate well. I am sending out a urine culture. During the interim if your signs and symptoms worsen rather than improving please notify us. We will notify your when the culture results are back.  Follow up in 7 days or as needed.  Rx zofran for any nausea or vomiting.

## 2014-07-07 NOTE — Progress Notes (Signed)
Subjective:    Patient ID: Miranda DickSylvia W Reineck, female    DOB: 10/07/1940, 74 y.o.   MRN: [id number]  HPI   Pt in today reporting urinary symptoms since 5 days.  Dysuria- yes Frequent urination-yes Hesitancy-yes Suprapubic pressure-yes Fever-no chills-no Nausea-yes Vomiting-yes CVA pain-rt cva tender History of UTI- yes. About 1 every 1.5 yr. Gross hematuria-no    Pt reports on Monday had some vomiting with some loose stools. Stools are little better today. No vomiting today.    Review of Systems  Constitutional: Positive for diaphoresis and fatigue. Negative for fever and chills.  Respiratory: Negative for cough, chest tightness, shortness of breath and wheezing.   Cardiovascular: Negative for chest pain and palpitations.  Gastrointestinal: Positive for nausea, vomiting and diarrhea. Negative for abdominal pain.  Genitourinary: Positive for dysuria, urgency, frequency and pelvic pain.  Musculoskeletal: Positive for back pain.  Neurological: Negative for dizziness, syncope, facial asymmetry, speech difficulty, weakness, numbness and headaches.  Hematological: Negative for adenopathy. Does not bruise/bleed easily.  Psychiatric/Behavioral: Negative for behavioral problems, confusion and decreased concentration.    Past Medical History  Diagnosis Date  . Hyperlipidemia   . Hypertension   . Osteoporosis     pt cannot tolerate fosamax  . Arthritis     osteoarthritis  . Barrett's esophagus   . GERD (gastroesophageal reflux disease)   . Chronic obstructive bronchitis     Dr Delford FieldWright  . IFG (impaired fasting glucose)   . Colon polyps   . History of nephrolithiasis   . History of chicken pox   . Positive PPD     exposure to 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 Positive PPD only, negative CXR.  [name]. History of transfusion of packed red blood cells   . Urinary incontinence   . Colitis   . Chronic kidney disease     kidney stones  . Cataract     bilateral cateracts removed    History     Social History  . Marital Status: Widowed    Spouse Name: N/A  . Number of Children: N/A  . Years of Education: N/A   Occupational History  . Retired    Social History Main Topics  . Smoking status: Former Smoker -- 15 years    Quit date: 05/15/1967  . Smokeless tobacco: Never Used  . Alcohol Use: No  . Drug Use: No  . Sexual Activity: Not on file   Other Topics Concern  . Not on file   Social History Narrative   Regular exercise: yes    Past Surgical History  Procedure Laterality Date  . Abdominal hysterectomy  1970  . Appendectomy    . Cholecystectomy    . Total knee arthroplasty  04/2006    total left knee replacement  . Leg surgery  2008    right    Family History  Problem Relation Age of Onset  . Coronary artery disease Mother   . Arthritis Mother   . Tuberculosis    . Asthma Maternal Grandmother   . Colon cancer Neg Hx   . Esophageal cancer Neg Hx   . Rectal cancer Neg Hx   . Stomach cancer Neg Hx     Allergies  Allergen Reactions  . Atorvastatin Other (See Comments)    REACTION: MUSCLE PAINS  . Diclofenac-Misoprostol Diarrhea and Nausea And Vomiting  . Fenofibrate Other (See Comments)    REACTION: carpopedal spasms  . Rosuvastatin Other (See Comments)    REACTION: MUSCLE PAINS  . Statins Other (See  Comments)    myalgias  . Amoxicillin-Pot Clavulanate Other (See Comments)    REACTION: unspecified  . Penicillins Hives  . Sulfonamide Derivatives Hives  . Tuberculin Ppd Other (See Comments)    Redness at sight    Current Outpatient Prescriptions on File Prior to Visit  Medication Sig Dispense Refill  . amLODipine (NORVASC) 5 MG tablet Take 1 tablet (5 mg total) by mouth daily. 90 tablet 1  . Ascorbic Acid (VITAMIN C) 1000 MG tablet Take 1,000 mg by mouth daily.    [name] aspirin 325 MG tablet Take 325 mg by mouth daily.      . beta carotene 15 MG capsule Take 15 mg by mouth daily.      . bifidobacterium infantis (ALIGN) capsule Take 1 capsule  by mouth daily.      . Calcium Carbonate-Vitamin D (CALTRATE 600+D) 600-400 MG-UNIT per tablet Take 1 tablet by mouth 2 (two) times daily.    . carboxymethylcellulose (REFRESH PLUS) 0.5 % SOLN Place 1 drop into both eyes 2 (two) times daily.    . Coenzyme Q10 200 MG capsule Take 200 mg by mouth daily.      . hydrocortisone (ANUSOL-HC) 2.5 % rectal cream Place 1 application rectally 2 (two) times daily. Use a smal amount inside and outside the rectum for hemorrhoids    . Multiple Vitamin (MULTIVITAMIN) tablet Take 1 tablet by mouth daily.      . niacin 500 MG tablet Take 1,500 mg by mouth at bedtime.    . Omega-3 Fatty Acids (FISH OIL) 1000 MG CAPS Take 1 capsule by mouth every morning.     [name] omeprazole (PRILOSEC) 20 MG capsule Take 1 capsule (20 mg total) by mouth See admin instructions. Takes 1 cap daily and then 1 cap as needed 180 capsule 1  . potassium chloride (MICRO-K) 10 MEQ CR capsule Take 1 capsule (10 mEq total) by mouth daily. 90 capsule 1  . quinapril-hydrochlorothiazide (ACCURETIC) 20-25 MG per tablet Take 1 tablet by mouth daily. 90 tablet 1  . vitamin E (VITAMIN E) 400 UNIT capsule Take 400 Units by mouth daily.       No current facility-administered medications on file prior to visit.    BP 105/71 mmHg  Pulse 101  Temp(Src) 98.1 F (36.7 C) (Oral)  Ht  (1.575 m)  Wt 172 lb 3.2 oz (78.109 kg)  BMI 31.49 kg/m2  SpO2 96%  LMP 05/14/1968       Objective:   Physical Exam   General  Mental Status- Alert. Orientation- Orientation x 4.   Skin General:- Normal. Moisture- Dry. Temperature- Warm.  HEENT Head- normal.  Neck Neck- Supple.  Heart Ausculation-RRR  Lungs Ausculation- Clear, even, unlabored bilaterlly.    Abdomen Palpation/Percussion: Palpation and Percussion of the abdomen reveal- mild mid suprapubic  Tender, No Rebound tenderness, No Rigidity(guarding), No Palpable abdominal masses and No jar tenderness. Mild mid suprapubic  tenderness. Liver:-Normal. Spleen:- Normal. Other Characteristics- No Costovertebral angle tenderness- Left side Costovertebral angle tenderness- Right side some. Auscultation: Auscultation of the abdomen reveals- Bowel Sounds normal.       Assessment & Plan:

## 2014-07-07 NOTE — Assessment & Plan Note (Signed)
Your GI symptoms seem to be improving per your report today. Bland diet guidelines. immodium otc. Your treatment for uti with cipro may promote mild loose stools. Get probiotic otc. If your diarrhea worsen again by Friday notify us and would recommend stool panel kit.

## 2014-07-07 NOTE — Progress Notes (Signed)
Pre visit review using our clinic review tool, if applicable. No additional management support is needed unless otherwise documented below in the visit note. 

## 2014-08-26 ENCOUNTER — Telehealth: Payer: Self-pay | Admitting: Family

## 2014-08-26 NOTE — Telephone Encounter (Signed)
PA initiated. Awaiting determination. JG//CMA 

## 2014-08-26 NOTE — Telephone Encounter (Signed)
Caller name: Rolland PorterMichelle H. [facility] [employer] [facility]  Call back number: [phone]    Reason for call:  As per [employer] medicare quinapril-hydrochlorothiazide (ACCURETIC) 20-25 MG per tablet needs prio-auth.

## 2014-08-27 MED ORDER — QUINAPRIL-HYDROCHLOROTHIAZIDE 20-25 MG PO TABS: 1.0000 | ORAL_TABLET | Freq: Every day | ORAL | Status: DC

## 2014-08-27 NOTE — Telephone Encounter (Signed)
Refill sent.

## 2014-08-27 NOTE — Telephone Encounter (Signed)
[employer]bs called states PA has been approved effective 08-26-14 and good for 1 yr.

## 2014-08-27 NOTE — Telephone Encounter (Signed)
Pt requesting a refill quinapril-hydrochlorothiazide (ACCURETIC) 20-25 MG per tablet please send to [facility]

## 2014-12-13 ENCOUNTER — Telehealth: Payer: Self-pay | Admitting: Behavioral Health

## 2014-12-13 ENCOUNTER — Encounter: Payer: Self-pay | Admitting: Behavioral Health

## 2014-12-13 NOTE — Telephone Encounter (Signed)
Pre-Visit Call completed with patient and chart updated.   Pre-Visit Info documented in Specialty Comments under Snap[employer]ot.    

## 2014-12-14 ENCOUNTER — Telehealth: Payer: Self-pay | Admitting: *Deleted

## 2014-12-14 ENCOUNTER — Ambulatory Visit (INDEPENDENT_AMBULATORY_CARE_PROVIDER_SITE_OTHER): Payer: Medicare Other | Admitting: Family

## 2014-12-14 ENCOUNTER — Encounter: Payer: Self-pay | Admitting: Family

## 2014-12-14 VITALS — BP 124/82 | HR 98 | Temp 98.1°F | Resp 16

## 2014-12-14 DIAGNOSIS — Z0000 Encounter for general adult medical examination without abnormal findings: Secondary | ICD-10-CM

## 2014-12-14 DIAGNOSIS — R739 Hyperglycemia, unspecified: Secondary | ICD-10-CM | POA: Diagnosis not present

## 2014-12-14 DIAGNOSIS — Z1239 Encounter for other screening for malignant neoplasm of breast: Secondary | ICD-10-CM

## 2014-12-14 DIAGNOSIS — Z13820 Encounter for screening for osteoporosis: Secondary | ICD-10-CM

## 2014-12-14 LAB — BASIC METABOLIC PANEL
BUN: 13 mg/dL (ref 6–23)
CALCIUM: 9.6 mg/dL (ref 8.4–10.5)
CO2: 27 meq/L (ref 19–32)
Chloride: 99 mEq/L (ref 96–112)
Creatinine, Ser: 0.7 mg/dL (ref 0.40–1.20)
GFR: 87.02 mL/min (ref >60.00)
GLUCOSE: 104 mg/dL — AB (ref 70–99)
Potassium: 4 mEq/L (ref 3.5–5.1)
SODIUM: 135 meq/L (ref 135–145)

## 2014-12-14 MED ORDER — OMEPRAZOLE 20 MG PO CPDR: 20.0000 mg | DELAYED_RELEASE_CAPSULE | ORAL | Status: DC

## 2014-12-14 MED ORDER — AMLODIPINE BESYLATE 5 MG PO TABS: 5.0000 mg | ORAL_TABLET | Freq: Every day | ORAL | Status: DC

## 2014-12-14 MED ORDER — POTASSIUM CHLORIDE ER 10 MEQ PO CPCR: 10.0000 meq | ORAL_CAPSULE | Freq: Every day | ORAL | Status: DC

## 2014-12-14 NOTE — Progress Notes (Signed)
Subjective:    [name] is a 74 y.o. female who presents for Medicare Annual/Subsequent preventive examination.   Preventive Screening-Counseling & Management  Tobacco History  Smoking status  . Former Smoker -- 15 years  . Quit date: 05/15/1967  Smokeless tobacco  . Never Used     Problems Prior to Visit 1. HTN-  maintained on accuretic.   BP Readings from Last 3 Encounters:  12/14/14 124/82  07/07/14 105/71  06/15/14 126/78   2.  GERD/Barrett's Esophagus-  Maintained on PPI.    3.  Hyperglycemia-  Lab Results  Component Value Date   HGBA1C 6.0 06/15/2014   4. Hyperlipidemia- intolerant to statins and intolerant to fibrates.   Lab Results  Component Value Date   CHOL 244* 11/17/2013   HDL 55 11/17/2013   LDLCALC 159* 11/17/2013   LDLDIRECT 158.8 06/10/2008   TRIG 150* 11/17/2013   CHOLHDL 4.4 11/17/2013   5. Prev- Patient presents today for complete physical.  Immunizations: 2008 was tetanus, declines shingles vaccine.   Diet: not eating healthy Exericse: no due to back pain Colonoscopy: 2008- due 2018 Dexa: due Pap Smear: hysterectomy Mammogram: due    Current Problems (verified) Patient Active Problem List   Diagnosis Date Noted  . UTI (urinary tract infection) 07/07/2014  . Diarrhea 07/07/2014  . Colitis due to Escherichia coli 02/26/2014  . Hyponatremia 02/20/2014  . Hypokalemia 02/20/2014  . Bloody diarrhea 02/10/2014  . Edema 11/17/2013  . Hyperglycemia 11/17/2013  . Need for prophylactic vaccination and inoculation against influenza 02/08/2012  . Impaired fasting glucose 05/20/2008  . HYPERCHOLESTEROLEMIA 04/01/2008  . BARRETTS ESOPHAGUS 04/01/2008  . OSTEOARTHRITIS 04/01/2008  . URINARY INCONTINENCE 04/01/2008  . TOBACCO ABUSE, HX OF 04/01/2008  . GERD 09/24/2007  . Essential hypertension 04/01/2007  . Osteoporosis 05/31/2006  . COLONIC POLYPS, HX OF 05/31/2006  . NEPHROLITHIASIS, HX OF 05/31/2006    Medications Prior to  Visit Current Outpatient Prescriptions on File Prior to Visit  Medication Sig Dispense Refill  . amLODipine (NORVASC) 5 MG tablet Take 1 tablet (5 mg total) by mouth daily. 90 tablet 1  . Ascorbic Acid (VITAMIN C) 1000 MG tablet Take 1,000 mg by mouth daily.    [name] aspirin 325 MG tablet Take 325 mg by mouth daily.      . beta carotene 15 MG capsule Take 15 mg by mouth daily.      . bifidobacterium infantis (ALIGN) capsule Take 1 capsule by mouth daily.      . Calcium Carbonate-Vitamin D (CALTRATE 600+D) 600-400 MG-UNIT per tablet Take 1 tablet by mouth 2 (two) times daily.    . carboxymethylcellulose (REFRESH PLUS) 0.5 % SOLN Place 1 drop into both eyes 2 (two) times daily.    . Coenzyme Q10 200 MG capsule Take 200 mg by mouth daily.      . Multiple Vitamin (MULTIVITAMIN) tablet Take 1 tablet by mouth daily.      . niacin 500 MG tablet Take 1,500 mg by mouth at bedtime.    . Omega-3 Fatty Acids (FISH OIL) 1000 MG CAPS Take 1 capsule by mouth every morning.     [name] omeprazole (PRILOSEC) 20 MG capsule Take 1 capsule (20 mg total) by mouth See admin instructions. Takes 1 cap daily and then 1 cap as needed 180 capsule 1  . ondansetron (ZOFRAN ODT) 8 MG disintegrating tablet Take 1 tablet (8 mg total) by mouth every 8 (eight) hours as needed for nausea or vomiting. 9 tablet 0  .  potassium chloride (MICRO-K) 10 MEQ CR capsule Take 1 capsule (10 mEq total) by mouth daily. 90 capsule 1  . quinapril-hydrochlorothiazide (ACCURETIC) 20-25 MG per tablet Take 1 tablet by mouth daily. 90 tablet 1  . vitamin E (VITAMIN E) 400 UNIT capsule Take 400 Units by mouth daily.       No current facility-administered medications on file prior to visit.    Current Medications (verified) Current Outpatient Prescriptions  Medication Sig Dispense Refill  . amLODipine (NORVASC) 5 MG tablet Take 1 tablet (5 mg total) by mouth daily. 90 tablet 1  . Ascorbic Acid (VITAMIN C) 1000 MG tablet Take 1,000 mg by mouth daily.    [name]  aspirin 325 MG tablet Take 325 mg by mouth daily.      . beta carotene 15 MG capsule Take 15 mg by mouth daily.      . bifidobacterium infantis (ALIGN) capsule Take 1 capsule by mouth daily.      . Calcium Carbonate-Vitamin D (CALTRATE 600+D) 600-400 MG-UNIT per tablet Take 1 tablet by mouth 2 (two) times daily.    . carboxymethylcellulose (REFRESH PLUS) 0.5 % SOLN Place 1 drop into both eyes 2 (two) times daily.    . Coenzyme Q10 200 MG capsule Take 200 mg by mouth daily.      . Multiple Vitamin (MULTIVITAMIN) tablet Take 1 tablet by mouth daily.      . niacin 500 MG tablet Take 1,500 mg by mouth at bedtime.    . Omega-3 Fatty Acids (FISH OIL) 1000 MG CAPS Take 1 capsule by mouth every morning.     [name] omeprazole (PRILOSEC) 20 MG capsule Take 1 capsule (20 mg total) by mouth See admin instructions. Takes 1 cap daily and then 1 cap as needed 180 capsule 1  . ondansetron (ZOFRAN ODT) 8 MG disintegrating tablet Take 1 tablet (8 mg total) by mouth every 8 (eight) hours as needed for nausea or vomiting. 9 tablet 0  . potassium chloride (MICRO-K) 10 MEQ CR capsule Take 1 capsule (10 mEq total) by mouth daily. 90 capsule 1  . quinapril-hydrochlorothiazide (ACCURETIC) 20-25 MG per tablet Take 1 tablet by mouth daily. 90 tablet 1  . vitamin E (VITAMIN E) 400 UNIT capsule Take 400 Units by mouth daily.       No current facility-administered medications for this visit.     Allergies (verified) Atorvastatin; Diclofenac-misoprostol; Fenofibrate; Rosuvastatin; Statins; Amoxicillin-pot clavulanate; Penicillins; Sulfonamide derivatives; and Tuberculin ppd   PAST HISTORY  Family History Family History  Problem Relation Age of Onset  . Coronary artery disease Mother   . Arthritis Mother   . Tuberculosis    . Asthma Maternal Grandmother   . Colon cancer Neg Hx   . Esophageal cancer Neg Hx   . Rectal cancer Neg Hx   . Stomach cancer Neg Hx     Social History History  Substance Use Topics  . Smoking  status: Former Smoker -- 15 years    Quit date: 05/15/1967  . Smokeless tobacco: Never Used  . Alcohol Use: No     Are there smokers in your home (other than you)? No  Risk Factors Current exercise habits: see above Dietary issues discussed: discussed low fat diet   Cardiac risk factors: advanced age (older than 43 for men, 27 for women), dyslipidemia, hypertension and sedentary lifestyle.  Depression Screen (Note: if answer to either of the following is "Yes", a more complete depression screening is indicated)   Over the past two  weeks, have you felt down, depressed or hopeless? No  Over the past two weeks, have you felt little interest or pleasure in doing things? No  Have you lost interest or pleasure in daily life? No  Do you often feel hopeless? No  Do you cry easily over simple problems? No  Activities of Daily Living In your present state of health, do you have any difficulty performing the following activities?:  Driving? No Managing money?  No Feeding yourself? No Getting from bed to chair? No. Climbing a flight of stairs? No Preparing food and eating?: No Bathing or showering? No Getting dressed: No Getting to the toilet? No Using the toilet:No Moving around from place to place: No In the past year have you fallen or had a near fall?:No   Are you sexually active?  No  Do you have more than one partner?  No  Hearing Difficulties: No Do you often ask people to speak up or repeat themselves? No Do you experience ringing or noises in your ears? Occasional "buzzing" Do you have difficulty understanding soft or whispered voices? No   Do you feel that you have a problem with memory? No  Do you often misplace items? No  Do you feel safe at home?  Yes  Cognitive Testing  Alert? Yes  Normal Appearance?Yes  Oriented to person? Yes  Place? Yes   Time? Yes  Recall of three objects?  No- remebered 2 of three.  Can perform simple calculations? Yes  Displays  appropriate judgment?Yes  Can read the correct time from a watch face?Yes   Advanced Directives have been discussed with the patient? Yes  List the Names of Other Physician/Practitioners you currently use: 1.  Lina Sar MD- GI 2. [facility]  Indicate any recent Medical Services you may have received from other than [facility] providers in the past year (date may be approximate).  Immunization History  Administered Date(s) Administered  . Influenza Split 04/18/2011, 02/08/2012  . Influenza Whole 04/01/2007, 02/02/2008, 01/31/2009, 02/20/2010  . Influenza,inj,Quad PF,36+ Mos 02/11/2014  . Influenza-Unspecified 03/14/2013  . Pneumococcal Polysaccharide-23 07/24/2006  . Td 03/19/2002    Screening Tests Health Maintenance  Topic Date Due  . PNA vac Low Risk Adult (2 of 2 - PCV13) 07/24/2007  . TETANUS/TDAP  03/19/2012  . MAMMOGRAM  12/11/2014  . INFLUENZA VACCINE  12/13/2014  . ZOSTAVAX  06/16/2019 (Originally 04/12/2001)  . COLONOSCOPY  03/20/2017  . DEXA SCAN  Completed    All answers were reviewed with the patient and necessary referrals were made:  O'SULLIVAN,Kiana Hollar S., NP   12/14/2014   History reviewed: allergies, current medications, past family history, past medical history, past social history, past surgical history and problem list  Review of Systems  Objective:   There is no weight on file to calculate BMI. BP 124/82 mmHg  Pulse 98  Temp(Src) 98.1 F (36.7 C) (Oral)  Resp 16  SpO2 98%  LMP 05/14/1968   Physical Exam  Constitutional: She is oriented to person, place, and time. She appears well-developed and well-nourished. No distress.  HENT:  Head: Normocephalic and atraumatic.  Right Ear: Tympanic membrane and ear canal normal.  Left Ear: Tympanic membrane and ear canal normal.  Mouth/Throat: Oropharynx is clear and moist.  Eyes: Pupils are equal, round, and reactive to light. No scleral icterus.  Neck: Normal range of motion. No thyromegaly present.   Cardiovascular: Normal rate and regular rhythm.   No murmur heard. Pulmonary/Chest: Effort normal and breath sounds normal.  No respiratory distress. He has no wheezes. She has no rales. She exhibits no tenderness.  Abdominal: Soft. Bowel sounds are normal. He exhibits no distension and no mass. There is no tenderness. There is no rebound and no guarding.  Musculoskeletal: She exhibits no edema.  Lymphadenopathy:    She has no cervical adenopathy.  Neurological: She is alert and oriented to person, place, and time. She has normal patellar reflexes. She exhibits normal muscle tone. Coordination normal.  Skin: Skin is warm and dry.  Psychiatric: She has a normal mood and affect. Her behavior is normal. Judgment and thought content normal.  Breasts: Examined lying Right: Without masses, retractions, discharge or axillary adenopathy.  Left: Without masses, retractions, discharge or axillary adenopathy.  Pelvic:  Deferred   Assessment & Plan:      Assessment:      Plan:     During the course of the visit the patient was educated and counseled about appropriate screening and preventive services including:    Screening mammography  Bone densitometry screening  Nutrition counseling   Diet review for nutrition referral? Yes ____  Not Indicated _x___   Patient Instructions (the written plan) was given to the patient.  Medicare Attestation I have personally reviewed: The patient's medical and social history Their use of alcohol, tobacco or illicit drugs Their current medications and supplements The patient's functional ability including ADLs,fall risks, home safety risks, cognitive, and hearing and visual impairment Diet and physical activities Evidence for depression or mood disorders  The patient's weight, height, BMI, and visual acuity have been recorded in the chart.  I have made referrals, counseling, and provided education to the patient based on review of the above and I  have provided the patient with a written personalized care plan for preventive services.     O'SULLIVAN,Corban Kistler S., NP   12/14/2014

## 2014-12-14 NOTE — Telephone Encounter (Signed)
Spoke with pt and she states she will stay with the  dose of Omeprazole since she does not have to take them everyday. She currently takes  once daily.  Please advise

## 2014-12-14 NOTE — Telephone Encounter (Signed)
Received call from [place] at [employer]. She states pt's insurance will not cover omeprazole  twice a day. Will cover  or  once a day.  Please advise.

## 2014-12-14 NOTE — Assessment & Plan Note (Signed)
Discussed diet, exercise, weight loss. Declines lipid therapy.  Refer for mammo and dexa. Plan MMSE next visit.

## 2014-12-14 NOTE — Progress Notes (Signed)
Pre visit review using our clinic review tool, if applicable. No additional management support is needed unless otherwise documented below in the visit note. 

## 2014-12-14 NOTE — Telephone Encounter (Signed)
Please change to  once daily.

## 2014-12-14 NOTE — Patient Instructions (Signed)
Please complete lab work prior to leaving. Follow up in 3 months. We will do a formal memory test at that time.

## 2014-12-15 ENCOUNTER — Encounter: Payer: Self-pay | Admitting: Family

## 2014-12-15 NOTE — Telephone Encounter (Signed)
Ok, please continue omeprazole  once daily.

## 2014-12-24 ENCOUNTER — Ambulatory Visit (HOSPITAL_BASED_OUTPATIENT_CLINIC_OR_DEPARTMENT_OTHER): Payer: Medicare Other

## 2014-12-24 ENCOUNTER — Other Ambulatory Visit (HOSPITAL_BASED_OUTPATIENT_CLINIC_OR_DEPARTMENT_OTHER): Payer: Medicare Other

## 2015-01-11 ENCOUNTER — Ambulatory Visit (INDEPENDENT_AMBULATORY_CARE_PROVIDER_SITE_OTHER): Payer: Medicare Other | Admitting: Physician Assistant

## 2015-01-11 ENCOUNTER — Encounter: Payer: Self-pay | Admitting: Physician Assistant

## 2015-01-11 VITALS — BP 136/88 | HR 100 | Temp 97.9°F | Resp 16 | Ht 62.0 in | Wt 184.5 lb

## 2015-01-11 DIAGNOSIS — B9689 Other specified bacterial agents as the cause of diseases classified elsewhere: Secondary | ICD-10-CM | POA: Insufficient documentation

## 2015-01-11 DIAGNOSIS — R399 Unspecified symptoms and signs involving the genitourinary system: Secondary | ICD-10-CM | POA: Diagnosis not present

## 2015-01-11 DIAGNOSIS — J019 Acute sinusitis, unspecified: Secondary | ICD-10-CM | POA: Insufficient documentation

## 2015-01-11 LAB — POCT URINALYSIS DIPSTICK
Bilirubin, UA: NEGATIVE
Blood, UA: NEGATIVE
Glucose, UA: NEGATIVE
Ketones, UA: NEGATIVE
Leukocytes, UA: NEGATIVE
NITRITE UA: NEGATIVE
PH UA: 6
PROTEIN UA: NEGATIVE
Spec Grav, UA: 1.015
UROBILINOGEN UA: 0.2

## 2015-01-11 MED ORDER — LEVOFLOXACIN 500 MG PO TABS: 500.0000 mg | ORAL_TABLET | Freq: Every day | ORAL | Status: DC

## 2015-01-11 NOTE — Progress Notes (Signed)
Patient presents to clinic today c/o 2 weeks of sinus pressure, sinus pain , ear pain, PND, dry cough and fatigue. Denies fever, chills, chest pain or SOB. Denies recent travel.  Patient also c/o 2-3 days of urinary urgency, frequency, suprapubic pressure and low back pain. Denies flank pain, nausea or vomiting. Endorses + history of UTI.  Past Medical History  Diagnosis Date  . Hyperlipidemia   . Hypertension   . Osteoporosis     pt cannot tolerate fosamax  . Arthritis     osteoarthritis  . Barrett's esophagus   . GERD (gastroesophageal reflux disease)   . Chronic obstructive bronchitis     Dr Delford Field  . IFG (impaired fasting glucose)   . Colon polyps   . History of nephrolithiasis   . History of chicken pox   . Positive PPD     exposure to 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 Positive PPD only, negative CXR.  [name] History of transfusion of packed red blood cells   . Urinary incontinence   . Colitis   . Chronic kidney disease     kidney stones  . Cataract     bilateral cateracts removed    Current Outpatient Prescriptions on File Prior to Visit  Medication Sig Dispense Refill  . amLODipine (NORVASC) 5 MG tablet Take 1 tablet (5 mg total) by mouth daily. 90 tablet 1  . Ascorbic Acid (VITAMIN C) 1000 MG tablet Take 1,000 mg by mouth daily.    [name] aspirin 325 MG tablet Take 325 mg by mouth daily.      . beta carotene 15 MG capsule Take 15 mg by mouth daily.      . bifidobacterium infantis (ALIGN) capsule Take 1 capsule by mouth daily.      . Calcium Carbonate-Vitamin D (CALTRATE 600+D) 600-400 MG-UNIT per tablet Take 1 tablet by mouth 2 (two) times daily.    . carboxymethylcellulose (REFRESH PLUS) 0.5 % SOLN Place 1 drop into both eyes 2 (two) times daily.    . Coenzyme Q10 200 MG capsule Take 200 mg by mouth daily.      . Multiple Vitamin (MULTIVITAMIN) tablet Take 1 tablet by mouth daily.      . niacin 500 MG tablet Take 1,500 mg by mouth at bedtime.    . Omega-3 Fatty Acids (FISH OIL)  1000 MG CAPS Take 1 capsule by mouth every morning.     [name] omeprazole (PRILOSEC) 20 MG capsule Take 1 capsule (20 mg total) by mouth See admin instructions. Takes 1 cap daily and then 1 cap as needed 180 capsule 1  . ondansetron (ZOFRAN ODT) 8 MG disintegrating tablet Take 1 tablet (8 mg total) by mouth every 8 (eight) hours as needed for nausea or vomiting. 9 tablet 0  . potassium chloride (MICRO-K) 10 MEQ CR capsule Take 1 capsule (10 mEq total) by mouth daily. 90 capsule 1  . quinapril-hydrochlorothiazide (ACCURETIC) 20-25 MG per tablet Take 1 tablet by mouth daily. 90 tablet 1  . vitamin E (VITAMIN E) 400 UNIT capsule Take 400 Units by mouth daily.       No current facility-administered medications on file prior to visit.    Allergies  Allergen Reactions  . Atorvastatin Other (See Comments)    REACTION: MUSCLE PAINS  . Diclofenac-Misoprostol Diarrhea and Nausea And Vomiting  . Fenofibrate Other (See Comments)    REACTION: carpopedal spasms  . Rosuvastatin Other (See Comments)    REACTION: MUSCLE PAINS  . Statins Other (See Comments)  myalgias  . Amoxicillin-Pot Clavulanate Other (See Comments)    REACTION: unspecified  . Penicillins Hives  . Sulfonamide Derivatives Hives  . Tuberculin Ppd Other (See Comments)    Redness at sight    Family History  Problem Relation Age of Onset  . Coronary artery disease Mother   . Arthritis Mother   . Tuberculosis    . Asthma Maternal Grandmother   . Colon cancer Neg Hx   . Esophageal cancer Neg Hx   . Rectal cancer Neg Hx   . Stomach cancer Neg Hx     Social History   Social History  . Marital Status: Widowed    Spouse Name: N/A  . Number of Children: N/A  . Years of Education: N/A   Occupational History  . Retired    Social History Main Topics  . Smoking status: Former Smoker -- 15 years    Quit date: 05/15/1967  . Smokeless tobacco: Never Used  . Alcohol Use: No  . Drug Use: No  . Sexual Activity: Not Asked   Other  Topics Concern  . None   Social History Narrative   Regular exercise: yes   Review of Systems - See HPI.  All other ROS are negative.  BP 136/88 mmHg  Pulse 100  Temp(Src) 97.9 F (36.6 C) (Oral)  Resp 16  Ht  (1.575 m)  Wt 184 lb 8 oz (83.689 kg)  BMI 33.74 kg/m2  SpO2 97%  LMP 05/14/1968  Physical Exam  Constitutional: She is oriented to person, place, and time and well-developed, well-nourished, and in no distress.  HENT:  Head: Normocephalic and atraumatic.  Right Ear: External ear normal.  Left Ear: External ear normal.  Nose: Right sinus exhibits frontal sinus tenderness. Left sinus exhibits frontal sinus tenderness.  Mouth/Throat: Oropharynx is clear and moist. No oropharyngeal exudate.  Neck: Neck supple.  Cardiovascular: Normal rate, regular rhythm, normal heart sounds and intact distal pulses.   Pulmonary/Chest: Effort normal and breath sounds normal. No respiratory distress. She has no wheezes. She has no rales. She exhibits no tenderness.  Neurological: She is alert and oriented to person, place, and time.  Skin: Skin is warm and dry. No rash noted.  Psychiatric: Affect normal.  Vitals reviewed.   Recent Results (from the past 2160 hour(s))  Basic metabolic panel     Status: Abnormal   Collection Time: 12/14/14  9:54 AM  Result Value Ref Range   Sodium 135 135 - 145 mEq/L   Potassium 4.0 3.5 - 5.1 mEq/L   Chloride 99 96 - 112 mEq/L   CO2 27 19 - 32 mEq/L   Glucose, Bld 104 (H) 70 - 99 mg/dL   BUN 13 6 - 23 mg/dL   Creatinine, Ser 6.04 0.40 - 1.20 mg/dL   Calcium 9.6 8.4 - 54.0 mg/dL   GFR 98.11 >91.47 mL/min    Assessment/Plan: UTI symptoms Urine sent for UA and culture. Giving symptomology, history and current bacterial sinusitis, will begin empiric therapy. Antibiotic given. Will alter regimen based on culture results. Increase fluids and continue probiotic.  Acute bacterial sinusitis Penicillin and Sulfa allergic. Rx Levaquin 500 mg once  daily x 7 days. Increase fluids. Saline nasal spray. Continue chronic medications. Plain Mucinex. Follow-up PRN if symptoms are not improving.

## 2015-01-11 NOTE — Patient Instructions (Signed)
Please take antibiotic as directed.  Increase fluid intake.  Use Saline nasal spray.  Take a daily multivitamin. Use plain Mucinex for congestion.  Place a humidifier in the bedroom.  Please call or return clinic if symptoms are not improving.  For urinary symptoms - stay well hydrated, take antibiotic and continue your probiotic. I will call you with your culture results.  Sinusitis Sinusitis is redness, soreness, and swelling (inflammation) of the paranasal sinuses. Paranasal sinuses are air pockets within the bones of your face (beneath the eyes, the middle of the forehead, or above the eyes). In healthy paranasal sinuses, mucus is able to drain out, and air is able to circulate through them by way of your nose. However, when your paranasal sinuses are inflamed, mucus and air can become trapped. This can allow bacteria and other germs to grow and cause infection. Sinusitis can develop quickly and last only a short time (acute) or continue over a long period (chronic). Sinusitis that lasts for more than 12 weeks is considered chronic.  CAUSES  Causes of sinusitis include:  Allergies.  Structural abnormalities, such as displacement of the cartilage that separates your nostrils (deviated septum), which can decrease the air flow through your nose and sinuses and affect sinus drainage.  Functional abnormalities, such as when the small hairs (cilia) that line your sinuses and help remove mucus do not work properly or are not present. SYMPTOMS  Symptoms of acute and chronic sinusitis are the same. The primary symptoms are pain and pressure around the affected sinuses. Other symptoms include:  Upper toothache.  Earache.  Headache.  Bad breath.  Decreased sense of smell and taste.  A cough, which worsens when you are lying flat.  Fatigue.  Fever.  Thick drainage from your nose, which often is green and may contain pus (purulent).  Swelling and warmth over the affected  sinuses. DIAGNOSIS  Your caregiver will perform a physical exam. During the exam, your caregiver may:  Look in your nose for signs of abnormal growths in your nostrils (nasal polyps).  Tap over the affected sinus to check for signs of infection.  View the inside of your sinuses (endoscopy) with a special imaging device with a light attached (endoscope), which is inserted into your sinuses. If your caregiver suspects that you have chronic sinusitis, one or more of the following tests may be recommended:  Allergy tests.  Nasal culture A sample of mucus is taken from your nose and sent to a lab and screened for bacteria.  Nasal cytology A sample of mucus is taken from your nose and examined by your caregiver to determine if your sinusitis is related to an allergy. TREATMENT  Most cases of acute sinusitis are related to a viral infection and will resolve on their own within 10 days. Sometimes medicines are prescribed to help relieve symptoms (pain medicine, decongestants, nasal steroid sprays, or saline sprays).  However, for sinusitis related to a bacterial infection, your caregiver will prescribe antibiotic medicines. These are medicines that will help kill the bacteria causing the infection.  Rarely, sinusitis is caused by a fungal infection. In theses cases, your caregiver will prescribe antifungal medicine. For some cases of chronic sinusitis, surgery is needed. Generally, these are cases in which sinusitis recurs more than 3 times per year, despite other treatments. HOME CARE INSTRUCTIONS   Drink plenty of water. Water helps thin the mucus so your sinuses can drain more easily.  Use a humidifier.  Inhale steam 3 to 4 times  a day (for example, sit in the bathroom with the shower running).  Apply a warm, moist washcloth to your face 3 to 4 times a day, or as directed by your caregiver.  Use saline nasal sprays to help moisten and clean your sinuses.  Take over-the-counter or  prescription medicines for pain, discomfort, or fever only as directed by your caregiver. SEEK IMMEDIATE MEDICAL CARE IF:  You have increasing pain or severe headaches.  You have nausea, vomiting, or drowsiness.  You have swelling around your face.  You have vision problems.  You have a stiff neck.  You have difficulty breathing. MAKE SURE YOU:   Understand these instructions.  Will watch your condition.  Will get help right away if you are not doing well or get worse. Document Released: 04/30/2005 Document Revised: 07/23/2011 Document Reviewed: 05/15/2011 [facility] Patient Information 2014 [place], [place].

## 2015-01-11 NOTE — Progress Notes (Signed)
Pre visit review using our clinic review tool, if applicable. No additional management support is needed unless otherwise documented below in the visit note/SLS  

## 2015-01-11 NOTE — Assessment & Plan Note (Signed)
Penicillin and Sulfa allergic. Rx Levaquin 500 mg once daily x 7 days. Increase fluids. Saline nasal spray. Continue chronic medications. Plain Mucinex. Follow-up PRN if symptoms are not improving.

## 2015-01-11 NOTE — Assessment & Plan Note (Signed)
Urine sent for UA and culture. Giving symptomology, history and current bacterial sinusitis, will begin empiric therapy. Antibiotic given. Will alter regimen based on culture results. Increase fluids and continue probiotic.

## 2015-01-14 LAB — CULTURE, URINE COMPREHENSIVE: Colony Count: 4000

## 2015-01-21 ENCOUNTER — Encounter: Payer: Self-pay | Admitting: Internal Medicine

## 2015-01-28 ENCOUNTER — Encounter: Payer: Self-pay | Admitting: Family

## 2015-01-28 ENCOUNTER — Ambulatory Visit (INDEPENDENT_AMBULATORY_CARE_PROVIDER_SITE_OTHER): Payer: Medicare Other | Admitting: Family

## 2015-01-28 ENCOUNTER — Ambulatory Visit (HOSPITAL_BASED_OUTPATIENT_CLINIC_OR_DEPARTMENT_OTHER)
Admission: RE | Admit: 2015-01-28 | Discharge: 2015-01-28 | Disposition: A | Discharge status: Home / Self Care | Payer: Medicare Other | Source: Ambulatory Visit | Attending: Family | Admitting: Family

## 2015-01-28 ENCOUNTER — Telehealth: Payer: Self-pay | Admitting: Family

## 2015-01-28 VITALS — BP 136/90 | HR 93 | Temp 98.0°F | Resp 16 | Ht 62.0 in | Wt 186.0 lb

## 2015-01-28 DIAGNOSIS — R609 Edema, unspecified: Secondary | ICD-10-CM

## 2015-01-28 DIAGNOSIS — Z23 Encounter for immunization: Secondary | ICD-10-CM

## 2015-01-28 DIAGNOSIS — M7989 Other specified soft tissue disorders: Secondary | ICD-10-CM | POA: Diagnosis not present

## 2015-01-28 DIAGNOSIS — Z96652 Presence of left artificial knee joint: Secondary | ICD-10-CM | POA: Diagnosis not present

## 2015-01-28 NOTE — Telephone Encounter (Signed)
Hold off on calling her. I spoke to coder- she will contact her insurance and then contact pt.

## 2015-01-28 NOTE — Progress Notes (Signed)
Subjective:    Patient ID: [name], female    DOB: [birth year], 74 y.o.   MRN: [id number]  HPI  [name] is a 74 yr old female who presents today with chief complaint of bilateral LE edema R>L.  She has previous hx of LE edema and underwent 2D echo 11/18/13.  Echo noted LVEF 55-60%. Reports that last week feet swelled nearly every day. Was sitting more than usual. She reports that she did have a 4.5 hr car trip (returned on Wednesday).  Denies associated calf pain.  Does report SOB with walking which is unchanged from baseline. Reports swelling is better today than it has been.    Review of Systems See HPI  Past Medical History  Diagnosis Date  . Hyperlipidemia   . Hypertension   . Osteoporosis     pt cannot tolerate fosamax  . Arthritis     osteoarthritis  . Barrett's esophagus   . GERD (gastroesophageal reflux disease)   . Chronic obstructive bronchitis     Dr Delford Field  . IFG (impaired fasting glucose)   . Colon polyps   . History of nephrolithiasis   . History of chicken pox   . Positive PPD     exposure to 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 Positive PPD only, negative CXR.  [name] History of transfusion of packed red blood cells   . Urinary incontinence   . Colitis   . Chronic kidney disease     kidney stones  . Cataract     bilateral cateracts removed    Social History   Social History  . Marital Status: Widowed    Spouse Name: N/A  . Number of Children: N/A  . Years of Education: N/A   Occupational History  . Retired    Social History Main Topics  . Smoking status: Former Smoker -- 15 years    Quit date: 05/15/1967  . Smokeless tobacco: Never Used  . Alcohol Use: No  . Drug Use: No  . Sexual Activity: Not on file   Other Topics Concern  . Not on file   Social History Narrative   Regular exercise: yes    Past Surgical History  Procedure Laterality Date  . Abdominal hysterectomy  1970  . Appendectomy    . Cholecystectomy    . Total knee arthroplasty   04/2006    total left knee replacement  . Leg surgery  2008    right    Family History  Problem Relation Age of Onset  . Coronary artery disease Mother   . Arthritis Mother   . Tuberculosis    . Asthma Maternal Grandmother   . Colon cancer Neg Hx   . Esophageal cancer Neg Hx   . Rectal cancer Neg Hx   . Stomach cancer Neg Hx     Allergies  Allergen Reactions  . Atorvastatin Other (See Comments)    REACTION: MUSCLE PAINS  . Diclofenac-Misoprostol Diarrhea and Nausea And Vomiting  . Fenofibrate Other (See Comments)    REACTION: carpopedal spasms  . Rosuvastatin Other (See Comments)    REACTION: MUSCLE PAINS  . Statins Other (See Comments)    myalgias  . Amoxicillin-Pot Clavulanate Other (See Comments)    REACTION: unspecified  . Penicillins Hives  . Sulfonamide Derivatives Hives  . Tuberculin Ppd Other (See Comments)    Redness at sight    Current Outpatient Prescriptions on File Prior to Visit  Medication Sig Dispense Refill  . amLODipine (NORVASC) 5 MG tablet Take  1 tablet (5 mg total) by mouth daily. 90 tablet 1  . Ascorbic Acid (VITAMIN C) 1000 MG tablet Take 1,000 mg by mouth daily.    [name] aspirin 325 MG tablet Take 325 mg by mouth daily.      . beta carotene 15 MG capsule Take 15 mg by mouth daily.      . bifidobacterium infantis (ALIGN) capsule Take 1 capsule by mouth daily.      . Calcium Carbonate-Vitamin D (CALTRATE 600+D) 600-400 MG-UNIT per tablet Take 1 tablet by mouth 2 (two) times daily.    . carboxymethylcellulose (REFRESH PLUS) 0.5 % SOLN Place 1 drop into both eyes 2 (two) times daily.    . Coenzyme Q10 200 MG capsule Take 200 mg by mouth daily.      . Multiple Vitamin (MULTIVITAMIN) tablet Take 1 tablet by mouth daily.      . niacin 500 MG tablet Take 1,500 mg by mouth at bedtime.    . Omega-3 Fatty Acids (FISH OIL) 1000 MG CAPS Take 1 capsule by mouth every morning.     [name] omeprazole (PRILOSEC) 20 MG capsule Take 1 capsule (20 mg total) by mouth See  admin instructions. Takes 1 cap daily and then 1 cap as needed 180 capsule 1  . ondansetron (ZOFRAN ODT) 8 MG disintegrating tablet Take 1 tablet (8 mg total) by mouth every 8 (eight) hours as needed for nausea or vomiting. 9 tablet 0  . potassium chloride (MICRO-K) 10 MEQ CR capsule Take 1 capsule (10 mEq total) by mouth daily. 90 capsule 1  . quinapril-hydrochlorothiazide (ACCURETIC) 20-25 MG per tablet Take 1 tablet by mouth daily. 90 tablet 1  . vitamin E (VITAMIN E) 400 UNIT capsule Take 400 Units by mouth daily.       No current facility-administered medications on file prior to visit.    BP 136/90 mmHg  Pulse 93  Temp(Src) 98 F (36.7 C) (Oral)  Resp 16  Ht 5\' 2"  (1.575 m)  Wt 186 lb (84.369 kg)  BMI 34.01 kg/m2  SpO2 99%  LMP 05/14/1968       Objective:   Physical Exam  Constitutional: She appears well-developed and well-nourished.  Cardiovascular: Normal rate, regular rhythm and normal heart sounds.   No murmur heard. Pulmonary/Chest: Effort normal and breath sounds normal. No respiratory distress. She has no wheezes.  Musculoskeletal:  1+ bilateral LE edema  Psychiatric: She has a normal mood and affect. Her behavior is normal. Judgment and thought content normal.          Assessment & Plan:  Edema-  Suspect dependent edema due to recent travel.  However would like to exclude DVT- will obtain bilateral LE extremity dopplers.  Flu shot today.

## 2015-01-28 NOTE — Progress Notes (Signed)
Pre visit review using our clinic review tool, if applicable. No additional management support is needed unless otherwise documented below in the visit note. 

## 2015-01-28 NOTE — Patient Instructions (Addendum)
Please complete ultrasound on the first floor. Try to elevated legs as much as possible. Call if worsening leg swelling, shortness of breath or chest pain.  Follow up in November as scheduled.

## 2015-01-28 NOTE — Telephone Encounter (Signed)
Please contact pt and let her know that I had our coder review our charges and they were submitted properly for her MWV on 8/2.  I suggest she contact her insurance and ask them why they are not covering a medicare wellness visit.  Let us know if there is anything else we can do to help.

## 2015-01-31 NOTE — Telephone Encounter (Signed)
[name], pt states the person she spoke with on Friday told her there was nothing they could do that pt needed to contact her insurance and change her PCP to Dr Laury Axon. Pt does not think it will do her any good to contact [employer]. I have emailed Dawn for a status update.

## 2015-01-31 NOTE — Telephone Encounter (Signed)
Patient checking on the status of below  [phone] / [phone]

## 2015-01-31 NOTE — Telephone Encounter (Signed)
Per Alvis Lemmings, she had Dr. Artist Pais listed as PCP. It was billed under [facility]. She needs to contact insurance and change pcp to [facility] and ask them to reprocess date of service.  She should try this first please. Thanks.

## 2015-01-31 NOTE — Telephone Encounter (Signed)
Ok, thanks.

## 2015-01-31 NOTE — Telephone Encounter (Signed)
Notified pt. She states she already did this and insurance could not find Dr Laury Axon in their system. Message sent to office manager for further assistance.

## 2015-02-01 ENCOUNTER — Telehealth: Payer: Self-pay | Admitting: Family

## 2015-02-01 ENCOUNTER — Ambulatory Visit (HOSPITAL_BASED_OUTPATIENT_CLINIC_OR_DEPARTMENT_OTHER)
Admission: RE | Admit: 2015-02-01 | Discharge: 2015-02-01 | Disposition: A | Discharge status: Home / Self Care | Payer: Medicare Other | Source: Ambulatory Visit | Attending: Family | Admitting: Family

## 2015-02-01 DIAGNOSIS — Z13820 Encounter for screening for osteoporosis: Secondary | ICD-10-CM

## 2015-02-01 DIAGNOSIS — M8580 Other specified disorders of bone density and structure, unspecified site: Secondary | ICD-10-CM | POA: Insufficient documentation

## 2015-02-01 DIAGNOSIS — Z1231 Encounter for screening mammogram for malignant neoplasm of breast: Secondary | ICD-10-CM | POA: Insufficient documentation

## 2015-02-01 DIAGNOSIS — Z1239 Encounter for other screening for malignant neoplasm of breast: Secondary | ICD-10-CM

## 2015-02-01 DIAGNOSIS — M810 Age-related osteoporosis without current pathological fracture: Secondary | ICD-10-CM

## 2015-02-01 LAB — HM MAMMOGRAPHY

## 2015-02-01 NOTE — Telephone Encounter (Signed)
Rose-- can you verify [employer] benefits for pt? She will be a new start.  Notified pt and she requests [employer] information be mailed to her and states we can proceed with insurance verification. Info. Mailed.

## 2015-02-01 NOTE — Telephone Encounter (Signed)
Please let pt know that bone density shows bone thinning. If she is agreeable to meds we could try prolia injection however I believe that she declined further med rx. Continue calcium in the form of of caltrate  + D one tablet by mouth twice daily.   In addition, please ensure regular weight bearing exercise such as walking.

## 2015-02-04 ENCOUNTER — Encounter: Payer: Self-pay | Admitting: *Deleted

## 2015-02-16 NOTE — Telephone Encounter (Signed)
I have electronically submitted pt's info for [employer] insurance verification and will notify you once I have a response. Thank you. °

## 2015-02-21 NOTE — Telephone Encounter (Signed)
I have rec'd [name]'s insurance verification for Prolia and she will have an estimated responsibility of $195. Please make her aware this is only an estimate and we will not know an exact amt until her insurance has paid.  I have sent a copy of the summary of benefits to be scanned into her chart.  If pt cannot afford $195 for her injection, please advise her to contact [employer] at [phone] and select option #1 to see if she qualifies for one of their assistance programs.  If she qualifies they will instruct her how to proceed.  If you have any questions, please let me know. Thank you.

## 2015-02-24 ENCOUNTER — Other Ambulatory Visit: Payer: Self-pay | Admitting: Family

## 2015-02-24 NOTE — Telephone Encounter (Signed)
Last filled: 08/27/14 Amt: 90, 1 Last OV: 01/28/15  90 day supply sent to pharmacy.

## 2015-03-02 NOTE — Telephone Encounter (Signed)
Notified pt and provided her with # below to apply for assistance. She will call us back if she can get assistance, then we will order the medication. Pt states is she doesn't qualify for assistance that she will not be able to go forward with injections.

## 2015-03-16 ENCOUNTER — Ambulatory Visit: Payer: Medicare Other | Admitting: Family

## 2015-03-23 ENCOUNTER — Ambulatory Visit (INDEPENDENT_AMBULATORY_CARE_PROVIDER_SITE_OTHER): Payer: Medicare Other | Admitting: Family

## 2015-03-23 ENCOUNTER — Encounter: Payer: Self-pay | Admitting: Family

## 2015-03-23 VITALS — BP 138/67 | HR 96 | Temp 98.1°F | Resp 16 | Ht 62.0 in | Wt 185.6 lb

## 2015-03-23 DIAGNOSIS — R739 Hyperglycemia, unspecified: Secondary | ICD-10-CM | POA: Diagnosis not present

## 2015-03-23 DIAGNOSIS — E7800 Pure hypercholesterolemia, unspecified: Secondary | ICD-10-CM

## 2015-03-23 DIAGNOSIS — Z23 Encounter for immunization: Secondary | ICD-10-CM

## 2015-03-23 NOTE — Progress Notes (Signed)
Subjective:    Patient ID: [name], female    DOB: 06/09/1940, 74 y.o.   MRN: [id number]  HPI  [name] is a 74 yr old female who presents today for follow up.  HTN- maintained on accuretic.   BP Readings from Last 3 Encounters:  03/23/15 138/67  01/28/15 136/90  01/11/15 136/88   Hyperlipidemia- Intolerant to statin. Plans to work on diet/exercise weight loss after the holidays.  Lab Results  Component Value Date   CHOL 244* 11/17/2013   HDL 55 11/17/2013   LDLCALC 159* 11/17/2013   LDLDIRECT 158.8 06/10/2008   TRIG 150* 11/17/2013   CHOLHDL 4.4 11/17/2013    Hyperglycemia-   Lab Results  Component Value Date   HGBA1C 6.0 06/15/2014    Review of Systems  Respiratory: Negative for cough.        Mild sob with exercise  Cardiovascular: Negative for chest pain.       Past Medical History  Diagnosis Date  . Hyperlipidemia   . Hypertension   . Osteoporosis     pt cannot tolerate fosamax  . Arthritis     osteoarthritis  . Barrett's esophagus   . GERD (gastroesophageal reflux disease)   . Chronic obstructive bronchitis (HCC)     Dr Delford FieldWright  . IFG (impaired fasting glucose)   . Colon polyps   . History of nephrolithiasis   . History of chicken pox   . Positive PPD     exposure to 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 Positive PPD only, negative CXR.  [name]. History of transfusion of packed red blood cells   . Urinary incontinence   . Colitis   . Chronic kidney disease     kidney stones  . Cataract     bilateral cateracts removed    Social History   Social History  . Marital Status: Widowed    Spouse Name: N/A  . Number of Children: N/A  . Years of Education: N/A   Occupational History  . Retired    Social History Main Topics  . Smoking status: Former Smoker -- 15 years    Quit date: 05/15/1967  . Smokeless tobacco: Never Used  . Alcohol Use: No  . Drug Use: No  . Sexual Activity: Not on file   Other Topics Concern  . Not on file   Social History  Narrative   Regular exercise: yes    Past Surgical History  Procedure Laterality Date  . Abdominal hysterectomy  1970  . Appendectomy    . Cholecystectomy    . Total knee arthroplasty  04/2006    total left knee replacement  . Leg surgery  2008    right    Family History  Problem Relation Age of Onset  . Coronary artery disease Mother   . Arthritis Mother   . Tuberculosis    . Asthma Maternal Grandmother   . Colon cancer Neg Hx   . Esophageal cancer Neg Hx   . Rectal cancer Neg Hx   . Stomach cancer Neg Hx     Allergies  Allergen Reactions  . Atorvastatin Other (See Comments)    REACTION: MUSCLE PAINS  . Diclofenac-Misoprostol Diarrhea and Nausea And Vomiting  . Fenofibrate Other (See Comments)    REACTION: carpopedal spasms  . Rosuvastatin Other (See Comments)    REACTION: MUSCLE PAINS  . Statins Other (See Comments)    myalgias  . Amoxicillin-Pot Clavulanate Other (See Comments)    REACTION: unspecified  . Penicillins Hives  .  Sulfonamide Derivatives Hives  . Tuberculin Ppd Other (See Comments)    Redness at sight    Current Outpatient Prescriptions on File Prior to Visit  Medication Sig Dispense Refill  . amLODipine (NORVASC) 5 MG tablet Take 1 tablet (5 mg total) by mouth daily. 90 tablet 1  . Ascorbic Acid (VITAMIN C) 1000 MG tablet Take 1,000 mg by mouth daily.    [name] aspirin 325 MG tablet Take 325 mg by mouth daily.      . beta carotene 15 MG capsule Take 15 mg by mouth daily.      . bifidobacterium infantis (ALIGN) capsule Take 1 capsule by mouth daily.      . Calcium Carbonate-Vitamin D (CALTRATE 600+D) 600-400 MG-UNIT per tablet Take 1 tablet by mouth 2 (two) times daily.    . carboxymethylcellulose (REFRESH PLUS) 0.5 % SOLN Place 1 drop into both eyes 2 (two) times daily.    . Coenzyme Q10 200 MG capsule Take 200 mg by mouth daily.      . Multiple Vitamin (MULTIVITAMIN) tablet Take 1 tablet by mouth daily.      . niacin 500 MG tablet Take 1,500 mg by  mouth at bedtime.    . Omega-3 Fatty Acids (FISH OIL) 1000 MG CAPS Take 1 capsule by mouth every morning.     [name] omeprazole (PRILOSEC) 20 MG capsule Take 1 capsule (20 mg total) by mouth See admin instructions. Takes 1 cap daily and then 1 cap as needed 180 capsule 1  . ondansetron (ZOFRAN ODT) 8 MG disintegrating tablet Take 1 tablet (8 mg total) by mouth every 8 (eight) hours as needed for nausea or vomiting. 9 tablet 0  . potassium chloride (MICRO-K) 10 MEQ CR capsule Take 1 capsule (10 mEq total) by mouth daily. 90 capsule 1  . quinapril-hydrochlorothiazide (ACCURETIC) 20-25 MG tablet TAKE 1 TABLET BY MOUTH DAILY. 90 tablet 0  . vitamin E (VITAMIN E) 400 UNIT capsule Take 400 Units by mouth daily.       No current facility-administered medications on file prior to visit.    BP 138/67 mmHg  Pulse 96  Temp(Src) 98.1 F (36.7 C) (Oral)  Resp 16  Ht  (1.575 m)  Wt 185 lb 9.6 oz (84.188 kg)  BMI 33.94 kg/m2  SpO2 98%  LMP 05/14/1968    Objective:   Physical Exam  Constitutional: She is oriented to person, place, and time. She appears well-developed and well-nourished.  Cardiovascular: Normal rate, regular rhythm and normal heart sounds.   No murmur heard. Pulmonary/Chest: Effort normal and breath sounds normal. No respiratory distress. She has no wheezes.  Musculoskeletal:  Trace bilateral LE edema  Neurological: She is alert and oriented to person, place, and time.  Psychiatric: She has a normal mood and affect. Her behavior is normal. Judgment and thought content normal.          Assessment & Plan:

## 2015-03-23 NOTE — Progress Notes (Signed)
Pre visit review using our clinic review tool, if applicable. No additional management support is needed unless otherwise documented below in the visit note. 

## 2015-03-23 NOTE — Assessment & Plan Note (Signed)
Declines flp due to intolerance to statins- wishes to work on diet/exercise/weight loss.   We did discuss performing MMSE to day to check her memory but she declines at this time, "I'm not there yet!"

## 2015-03-23 NOTE — Patient Instructions (Signed)
Please complete lab work prior to leaving.   

## 2015-03-23 NOTE — Assessment & Plan Note (Signed)
>>  ASSESSMENT AND PLAN FOR HYPERCHOLESTEROLEMIA WRITTEN ON 03/23/2015  2:04 PM BY O'SULLIVAN, Alver Leete, NP  Declines flp due to intolerance to statins- wishes to work on diet/exercise/weight loss.   We did discuss performing MMSE to day to check her memory but she declines at this time, I'm not there yet!

## 2015-03-23 NOTE — Assessment & Plan Note (Signed)
BP stable on current meds. Continue same, obtain bmet.  

## 2015-03-23 NOTE — Assessment & Plan Note (Signed)
>>  ASSESSMENT AND PLAN FOR HYPERGLYCEMIA WRITTEN ON 03/23/2015  2:04 PM BY O'SULLIVAN, Kesley Mullens, NP  Obtain A1C.

## 2015-03-23 NOTE — Assessment & Plan Note (Signed)
>>  ASSESSMENT AND PLAN FOR ESSENTIAL HYPERTENSION WRITTEN ON 03/23/2015  2:03 PM BY O'SULLIVAN, Omarrion Carmer, NP  BP stable on current meds.  Continue same, obtain bmet

## 2015-03-23 NOTE — Assessment & Plan Note (Signed)
Obtain A1C. 

## 2015-03-24 ENCOUNTER — Encounter: Payer: Self-pay | Admitting: Family

## 2015-03-24 LAB — BASIC METABOLIC PANEL
BUN: 11 mg/dL (ref 6–23)
CALCIUM: 9.5 mg/dL (ref 8.4–10.5)
CO2: 26 meq/L (ref 19–32)
CREATININE: 0.8 mg/dL (ref 0.40–1.20)
Chloride: 99 mEq/L (ref 96–112)
GFR: 74.53 mL/min (ref >60.00)
GLUCOSE: 106 mg/dL — AB (ref 70–99)
Potassium: 4.1 mEq/L (ref 3.5–5.1)
Sodium: 136 mEq/L (ref 135–145)

## 2015-03-24 LAB — HEMOGLOBIN A1C: Hgb A1c MFr Bld: 5.7 % (ref 4.6–6.5)

## 2015-05-25 ENCOUNTER — Other Ambulatory Visit: Payer: Self-pay | Admitting: Family

## 2015-05-26 MED FILL — QUINAPRIL-HCTZ 20-25 MG TAB: 20-25 | 90 days supply | Qty: 90 | Fill #0

## 2015-06-21 ENCOUNTER — Other Ambulatory Visit: Payer: Self-pay | Admitting: Family

## 2015-06-21 MED FILL — AMLODIPINE BESYLATE 5 MG TA: 5 | 90 days supply | Qty: 90 | Fill #0

## 2015-06-21 NOTE — Telephone Encounter (Signed)
Rx sent to the pharmacy by e-script.//AB/CMA 

## 2015-08-08 ENCOUNTER — Other Ambulatory Visit: Payer: Self-pay | Admitting: Family

## 2015-08-09 MED FILL — KLOR-CON M10 TABLET: 10 | 90 days supply | Qty: 90 | Fill #0

## 2015-08-22 ENCOUNTER — Other Ambulatory Visit: Payer: Self-pay | Admitting: Family

## 2015-08-23 MED FILL — QUINAPRIL-HCTZ 20-25 MG TAB: 20-25 | 90 days supply | Qty: 90 | Fill #0

## 2015-09-12 MED FILL — OMEPRAZOLE DR 20 MG CAPSULE: 20 | 90 days supply | Qty: 90 | Fill #1

## 2015-09-14 ENCOUNTER — Ambulatory Visit: Payer: Medicare Other | Admitting: Family

## 2015-09-14 DIAGNOSIS — H35371 Puckering of macula, right eye: Secondary | ICD-10-CM | POA: Diagnosis not present

## 2015-09-14 DIAGNOSIS — H1859 Other hereditary corneal dystrophies: Secondary | ICD-10-CM | POA: Diagnosis not present

## 2015-09-14 DIAGNOSIS — Z961 Presence of intraocular lens: Secondary | ICD-10-CM | POA: Diagnosis not present

## 2015-09-14 DIAGNOSIS — H52223 Regular astigmatism, bilateral: Secondary | ICD-10-CM | POA: Diagnosis not present

## 2015-09-14 DIAGNOSIS — H5202 Hypermetropia, left eye: Secondary | ICD-10-CM | POA: Diagnosis not present

## 2015-09-19 MED FILL — AMLODIPINE BESYLATE 5 MG TA: 5 | 90 days supply | Qty: 90 | Fill #1

## 2015-09-20 ENCOUNTER — Ambulatory Visit (INDEPENDENT_AMBULATORY_CARE_PROVIDER_SITE_OTHER): Payer: PPO | Admitting: Family

## 2015-09-20 ENCOUNTER — Ambulatory Visit: Payer: Medicare Other | Admitting: Family

## 2015-09-20 ENCOUNTER — Encounter: Payer: Self-pay | Admitting: Family

## 2015-09-20 VITALS — BP 123/55 | HR 75 | Temp 97.4°F | Ht 62.0 in | Wt 166.8 lb

## 2015-09-20 DIAGNOSIS — R300 Dysuria: Secondary | ICD-10-CM | POA: Diagnosis not present

## 2015-09-20 DIAGNOSIS — I10 Essential (primary) hypertension: Secondary | ICD-10-CM | POA: Diagnosis not present

## 2015-09-20 DIAGNOSIS — R739 Hyperglycemia, unspecified: Secondary | ICD-10-CM

## 2015-09-20 DIAGNOSIS — E785 Hyperlipidemia, unspecified: Secondary | ICD-10-CM | POA: Diagnosis not present

## 2015-09-20 DIAGNOSIS — E7800 Pure hypercholesterolemia, unspecified: Secondary | ICD-10-CM

## 2015-09-20 LAB — POC URINALSYSI DIPSTICK (AUTOMATED)
Bilirubin, UA: NEGATIVE
Glucose, UA: NEGATIVE
KETONES UA: NEGATIVE
Leukocytes, UA: NEGATIVE
Nitrite, UA: NEGATIVE
PROTEIN UA: NEGATIVE
RBC UA: NEGATIVE
SPEC GRAV UA: 1.015
UROBILINOGEN UA: 0.2
pH, UA: 6

## 2015-09-20 NOTE — Assessment & Plan Note (Signed)
BP stable on accuretic, continue same. Obtain bmet.

## 2015-09-20 NOTE — Assessment & Plan Note (Signed)
>>  ASSESSMENT AND PLAN FOR HYPERCHOLESTEROLEMIA WRITTEN ON 09/20/2015  1:43 PM BY O'SULLIVAN, Jadakiss Barish, NP  Obtain follow up lipid

## 2015-09-20 NOTE — Progress Notes (Signed)
Subjective:    Patient ID: [name], female    DOB: 1940-08-23, 75 y.o.   MRN: [id number]  HPI  [name] is a 75 yr old female who presents today for follow up.  1) HTN- maintainend on accuretic.  BP Readings from Last 3 Encounters:  09/20/15 123/55  03/23/15 138/67  01/28/15 136/90   2) Urinary problem- reports urinary frequency, dysuria, urgency.  3) hyperglycemia- She has been working on weight loss. Has cut back sugars and carbs. Wt Readings from Last 3 Encounters:  09/20/15 166 lb 12.8 oz (75.66 kg)  03/23/15 185 lb 9.6 oz (84.188 kg)  01/28/15 186 lb (84.369 kg)  Does some toning exercises.  Lab Results  Component Value Date   HGBA1C 5.7 03/23/2015   4) Hyperlipidemia- declines lipid lowering agent. Could not afford zetia.   Lab Results  Component Value Date   CHOL 244* 11/17/2013   HDL 55 11/17/2013   LDLCALC 159* 11/17/2013   LDLDIRECT 158.8 06/10/2008   TRIG 150* 11/17/2013   CHOLHDL 4.4 11/17/2013    Review of Systems See HPI  Past Medical History  Diagnosis Date  . Hyperlipidemia   . Hypertension   . Osteoporosis     pt cannot tolerate fosamax  . Arthritis     osteoarthritis  . Barrett's esophagus   . GERD (gastroesophageal reflux disease)   . Chronic obstructive bronchitis (HCC)     Dr Delford FieldWright  . IFG (impaired fasting glucose)   . Colon polyps   . History of nephrolithiasis   . History of chicken pox   . Positive PPD     exposure to 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 Positive PPD only, negative CXR.  [name]. History of transfusion of packed red blood cells   . Urinary incontinence   . Colitis   . Chronic kidney disease     kidney stones  . Cataract     bilateral cateracts removed     Social History   Social History  . Marital Status: Widowed    Spouse Name: N/A  . Number of Children: N/A  . Years of Education: N/A   Occupational History  . Retired    Social History Main Topics  . Smoking status: Former Smoker -- 15 years    Quit  date: 05/15/1967  . Smokeless tobacco: Never Used  . Alcohol Use: No  . Drug Use: No  . Sexual Activity: Not on file   Other Topics Concern  . Not on file   Social History Narrative   Regular exercise: yes    Past Surgical History  Procedure Laterality Date  . Abdominal hysterectomy  1970  . Appendectomy    . Cholecystectomy    . Total knee arthroplasty  04/2006    total left knee replacement  . Leg surgery  2008    right    Family History  Problem Relation Age of Onset  . Coronary artery disease Mother   . Arthritis Mother   . Tuberculosis    . Asthma Maternal Grandmother   . Colon cancer Neg Hx   . Esophageal cancer Neg Hx   . Rectal cancer Neg Hx   . Stomach cancer Neg Hx     Allergies  Allergen Reactions  . Atorvastatin Other (See Comments)    REACTION: MUSCLE PAINS  . Diclofenac-Misoprostol Diarrhea and Nausea And Vomiting  . Fenofibrate Other (See Comments)    REACTION: carpopedal spasms  . Rosuvastatin Other (See Comments)    REACTION: MUSCLE  PAINS  . Statins Other (See Comments)    myalgias  . Amoxicillin-Pot Clavulanate Other (See Comments)    REACTION: unspecified  . Penicillins Hives  . Sulfonamide Derivatives Hives  . Tuberculin Ppd Other (See Comments)    Redness at sight    Current Outpatient Prescriptions on File Prior to Visit  Medication Sig Dispense Refill  . amLODipine (NORVASC) 5 MG tablet TAKE 1 TABLET (5 MG TOTAL) BY MOUTH DAILY. 90 tablet 1  . Ascorbic Acid (VITAMIN C) 1000 MG tablet Take 1,000 mg by mouth daily.    [name] aspirin 325 MG tablet Take 325 mg by mouth daily.      . beta carotene 15 MG capsule Take 15 mg by mouth daily.      . bifidobacterium infantis (ALIGN) capsule Take 1 capsule by mouth daily.      . Calcium Carbonate-Vitamin D (CALTRATE 600+D) 600-400 MG-UNIT per tablet Take 1 tablet by mouth 2 (two) times daily.    . carboxymethylcellulose (REFRESH PLUS) 0.5 % SOLN Place 1 drop into both eyes 2 (two) times daily.      . Coenzyme Q10 200 MG capsule Take 200 mg by mouth daily.      [name] ibuprofen (ADVIL,MOTRIN) 200 MG tablet Take 800 mg by mouth daily.    [name] KLOR-CON M10 10 MEQ tablet TAKE 1 TABLET (10 MEQ TOTAL) BY MOUTH DAILY. 90 tablet 1  . Multiple Vitamin (MULTIVITAMIN) tablet Take 1 tablet by mouth daily.      . niacin 500 MG tablet Take 1,500 mg by mouth at bedtime.    . Omega-3 Fatty Acids (FISH OIL) 1000 MG CAPS Take 1 capsule by mouth every morning.     [name] omeprazole (PRILOSEC) 20 MG capsule Take 1 capsule (20 mg total) by mouth See admin instructions. Takes 1 cap daily and then 1 cap as needed 180 capsule 1  . ondansetron (ZOFRAN ODT) 8 MG disintegrating tablet Take 1 tablet (8 mg total) by mouth every 8 (eight) hours as needed for nausea or vomiting. 9 tablet 0  . quinapril-hydrochlorothiazide (ACCURETIC) 20-25 MG tablet TAKE 1 TABLET BY MOUTH DAILY. 90 tablet 1  . vitamin E (VITAMIN E) 400 UNIT capsule Take 400 Units by mouth daily.       No current facility-administered medications on file prior to visit.    BP 123/55 mmHg  Pulse 75  Temp(Src) 97.4 F (36.3 C) (Oral)  Ht  (1.575 m)  Wt 166 lb 12.8 oz (75.66 kg)  BMI 30.50 kg/m2  SpO2 97%  LMP 05/14/1968       Objective:   Physical Exam  Constitutional: She is oriented to person, place, and time. She appears well-developed and well-nourished.  HENT:  Head: Normocephalic and atraumatic.  Cardiovascular: Normal rate, regular rhythm and normal heart sounds.   No murmur heard. Pulmonary/Chest: Effort normal and breath sounds normal. No respiratory distress. She has no wheezes.  Neurological: She is alert and oriented to person, place, and time.  Psychiatric: She has a normal mood and affect. Her behavior is normal. Judgment and thought content normal.          Assessment & Plan:  Dysuria- UA clear. Will send for culture. Plan to treat if + for bacteria.

## 2015-09-20 NOTE — Assessment & Plan Note (Signed)
Obtain follow up lipid

## 2015-09-20 NOTE — Assessment & Plan Note (Signed)
Suspect A1C will look better with her dietary changes.  Encouraged pt to keep up the good work.

## 2015-09-20 NOTE — Assessment & Plan Note (Signed)
>>  ASSESSMENT AND PLAN FOR HYPERGLYCEMIA WRITTEN ON 09/20/2015  1:43 PM BY O'SULLIVAN, Devlin Mcveigh, NP  Suspect A1C will look better with her dietary changes.  Encouraged pt to keep up the good work.

## 2015-09-20 NOTE — Assessment & Plan Note (Signed)
>>  ASSESSMENT AND PLAN FOR ESSENTIAL HYPERTENSION WRITTEN ON 09/20/2015  1:42 PM BY O'SULLIVAN, Iyana Topor, NP  BP stable on accuretic, continue same. Obtain bmet.

## 2015-09-20 NOTE — Patient Instructions (Signed)
Please complete lab work prior to leaving. Keep up the good work with diet, exercise, weight loss.  Follow up in 6 months.

## 2015-09-21 ENCOUNTER — Telehealth: Payer: Self-pay | Admitting: Family

## 2015-09-21 LAB — BASIC METABOLIC PANEL
BUN: 13 mg/dL (ref 6–23)
CALCIUM: 9.9 mg/dL (ref 8.4–10.5)
CO2: 24 mEq/L (ref 19–32)
Chloride: 93 mEq/L — ABNORMAL LOW (ref 96–112)
Creatinine, Ser: 0.65 mg/dL (ref 0.40–1.20)
GFR: 94.59 mL/min (ref >60.00)
GLUCOSE: 92 mg/dL (ref 70–99)
POTASSIUM: 4.8 meq/L (ref 3.5–5.1)
SODIUM: 129 meq/L — AB (ref 135–145)

## 2015-09-21 LAB — LIPID PANEL
CHOL/HDL RATIO: 5
CHOLESTEROL: 330 mg/dL — AB (ref 0–200)
HDL: 61.6 mg/dL (ref >39.00)
LDL Cholesterol: 234 mg/dL — ABNORMAL HIGH (ref 0–99)
NonHDL: 268.2
Triglycerides: 170 mg/dL — ABNORMAL HIGH (ref 0.0–149.0)
VLDL: 34 mg/dL (ref 0.0–40.0)

## 2015-09-21 LAB — HEMOGLOBIN A1C: HEMOGLOBIN A1C: 5.9 % (ref 4.6–6.5)

## 2015-09-21 MED ORDER — QUINAPRIL HCL 20 MG PO TABS: 20.0000 mg | ORAL_TABLET | Freq: Every day | ORAL | Status: DC

## 2015-09-21 NOTE — Telephone Encounter (Signed)
Sodium is low. This is likely due to the hctz in her BP med.  I would like her to d/c accuretic and instead switch to quinipril 20mg  once daily. Follow back up in 2 weeks so we can recheck her blood pressure and her BP. Cholesterol is extremely high.  If she is willing, I will refer her to the lipid clinic to see if they can help with statin alternatives.

## 2015-09-22 LAB — URINE CULTURE
Colony Count: NO GROWTH
Organism ID, Bacteria: NO GROWTH

## 2015-09-22 MED FILL — QUINAPRIL 20 MG TABLET: 20 | 30 days supply | Qty: 30 | Fill #0

## 2015-09-26 NOTE — Telephone Encounter (Signed)
Already called patient who has picked up medication from pharmacy and started it. Appointment scheduled for 2 week BP check. Patient states she was not fasting when she came in last so she feels this is why Cholesterol was elevated. States she would rather re-check in November when she returns for OV. Will come in 2 weeks for NV for BP

## 2015-09-26 NOTE — Telephone Encounter (Signed)
Clydie BraunKaren, have you called this patient yet?  Please contact her today.

## 2015-10-07 ENCOUNTER — Ambulatory Visit (INDEPENDENT_AMBULATORY_CARE_PROVIDER_SITE_OTHER): Payer: PPO | Admitting: Family

## 2015-10-07 ENCOUNTER — Encounter (INDEPENDENT_AMBULATORY_CARE_PROVIDER_SITE_OTHER): Payer: Self-pay

## 2015-10-07 ENCOUNTER — Encounter: Payer: Self-pay | Admitting: Family

## 2015-10-07 VITALS — BP 132/71 | HR 81

## 2015-10-07 DIAGNOSIS — I10 Essential (primary) hypertension: Secondary | ICD-10-CM

## 2015-10-07 LAB — BASIC METABOLIC PANEL
BUN: 17 mg/dL (ref 6–23)
CHLORIDE: 102 meq/L (ref 96–112)
CO2: 25 mEq/L (ref 19–32)
Calcium: 9.4 mg/dL (ref 8.4–10.5)
Creatinine, Ser: 0.7 mg/dL (ref 0.40–1.20)
GFR: 86.82 mL/min (ref >60.00)
Glucose, Bld: 100 mg/dL — ABNORMAL HIGH (ref 70–99)
POTASSIUM: 4.4 meq/L (ref 3.5–5.1)
Sodium: 133 mEq/L — ABNORMAL LOW (ref 135–145)

## 2015-10-07 NOTE — Progress Notes (Signed)
Noted and agree. 

## 2015-10-07 NOTE — Progress Notes (Signed)
Pre visit review using our clinic review tool, if applicable. No additional management support is needed unless otherwise documented below in the visit note.  09/21/15 telephone note: Follow back up in 2 weeks so we can recheck her blood pressure and her BP.   Pt has already taken her BP medication today.  Per Sandford CrazeMelissa O'Sullivan: Continue current medications. BMET today.   Pt verbalized understanding of instructions.   Starla Linkarolyn J Ariadna Setter, RN

## 2015-10-07 NOTE — Patient Instructions (Signed)
Per Sandford CrazeMelissa O'Sullivan: Continue current medications. Check labs today.

## 2015-10-11 MED FILL — QUINAPRIL 20 MG TABLET: 20 | 30 days supply | Qty: 30 | Fill #1

## 2015-10-29 IMAGING — CR DG ABDOMEN ACUTE W/ 1V CHEST
3 series · 3 of 3 positions shown · non-contrast
Comparison: CT 02/10/2014

CLINICAL DATA: Inflammatory bowel disease.

EXAM:
ACUTE ABDOMEN SERIES (ABDOMEN 2 VIEW & CHEST 1 VIEW)

[view not recorded (1 of 3)]
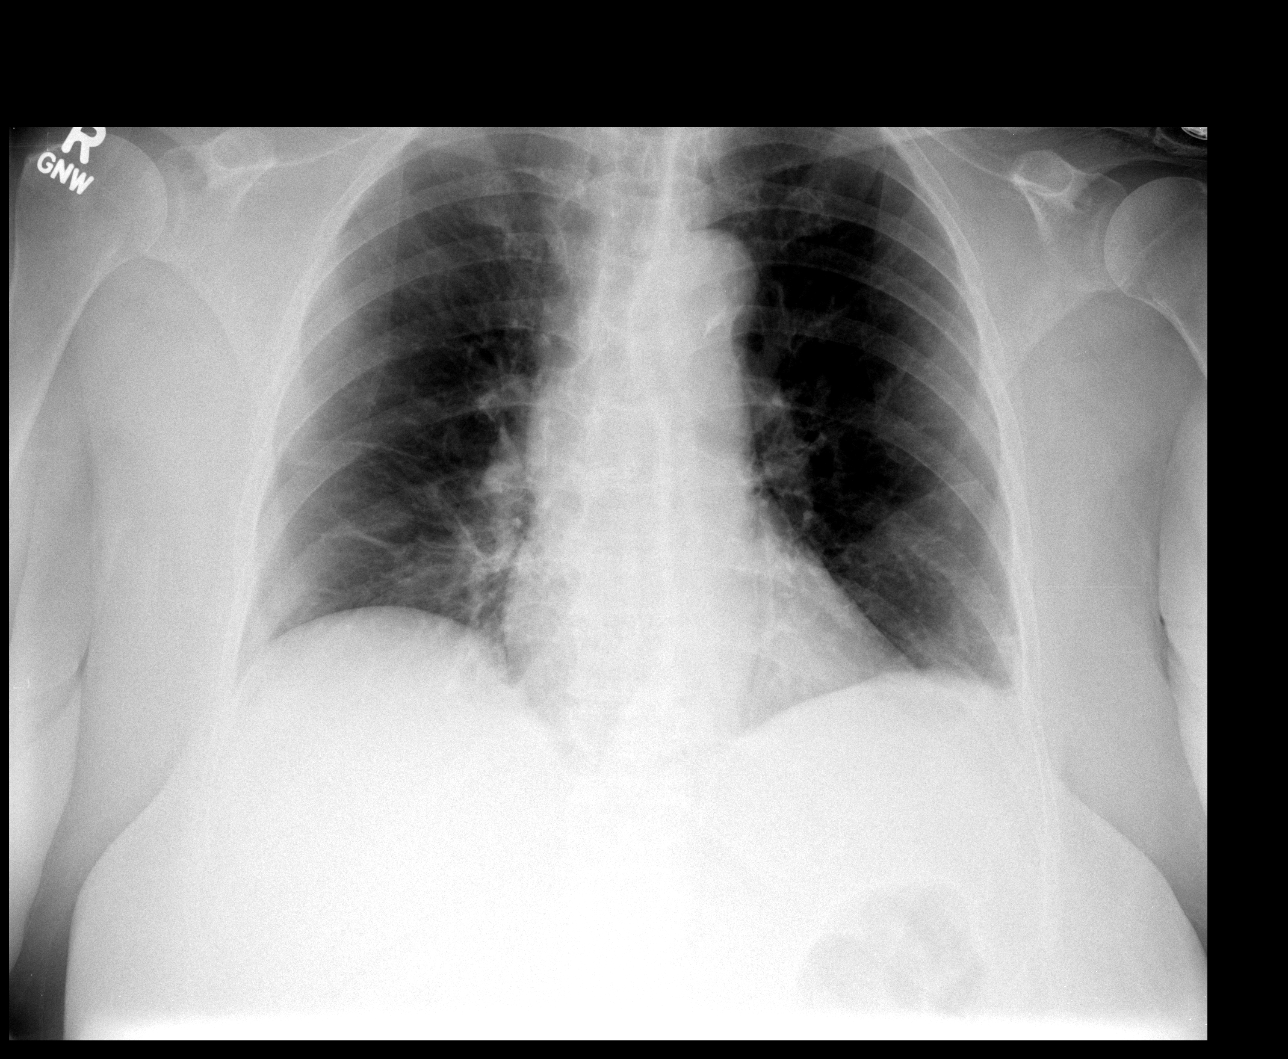

[view not recorded (2 of 3)]
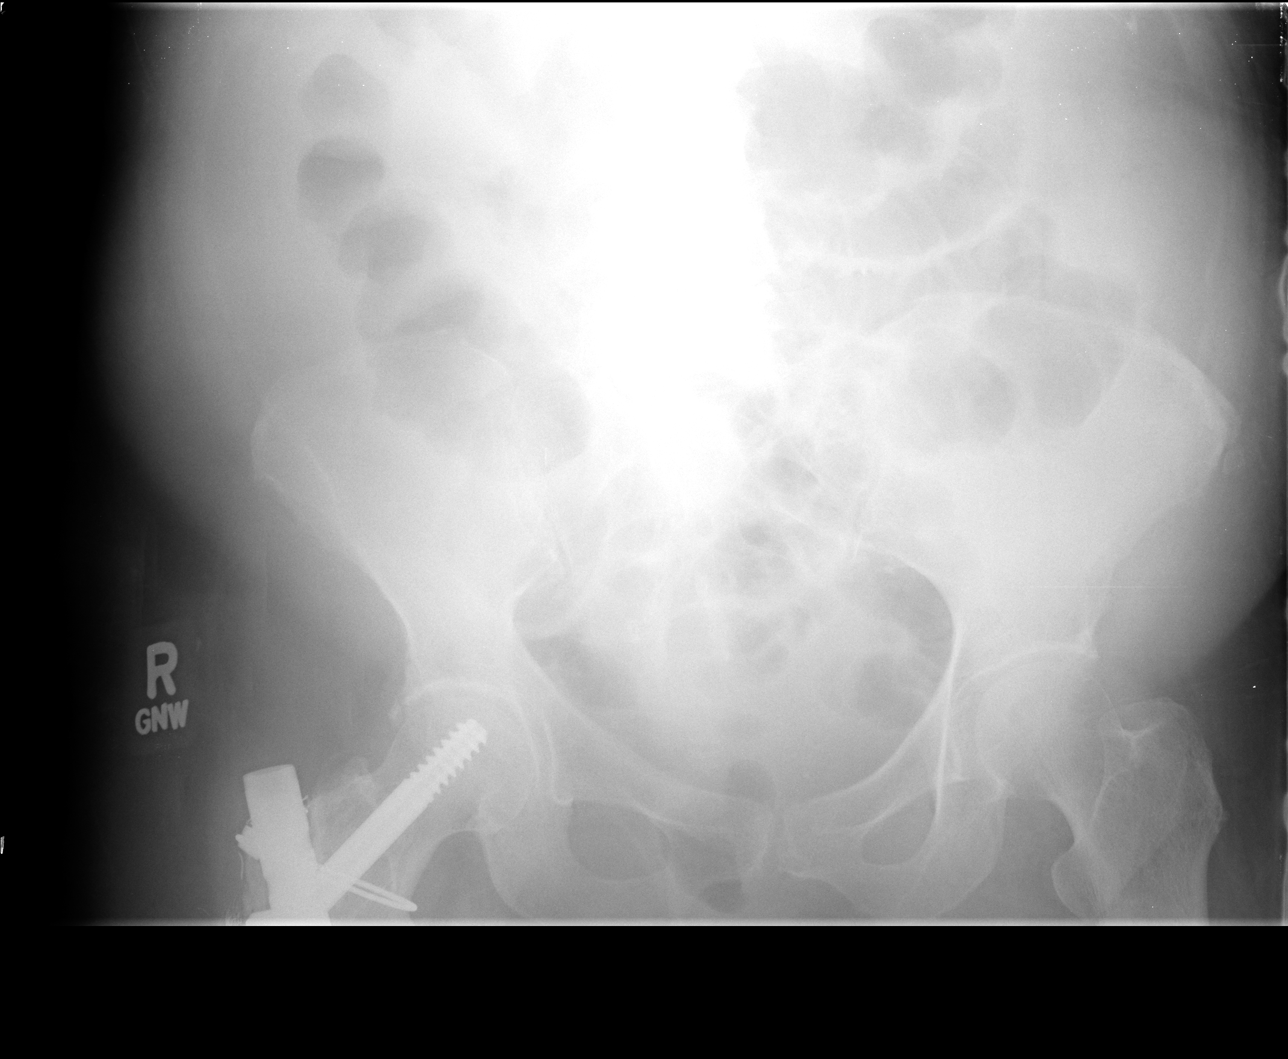

[view not recorded (3 of 3)]
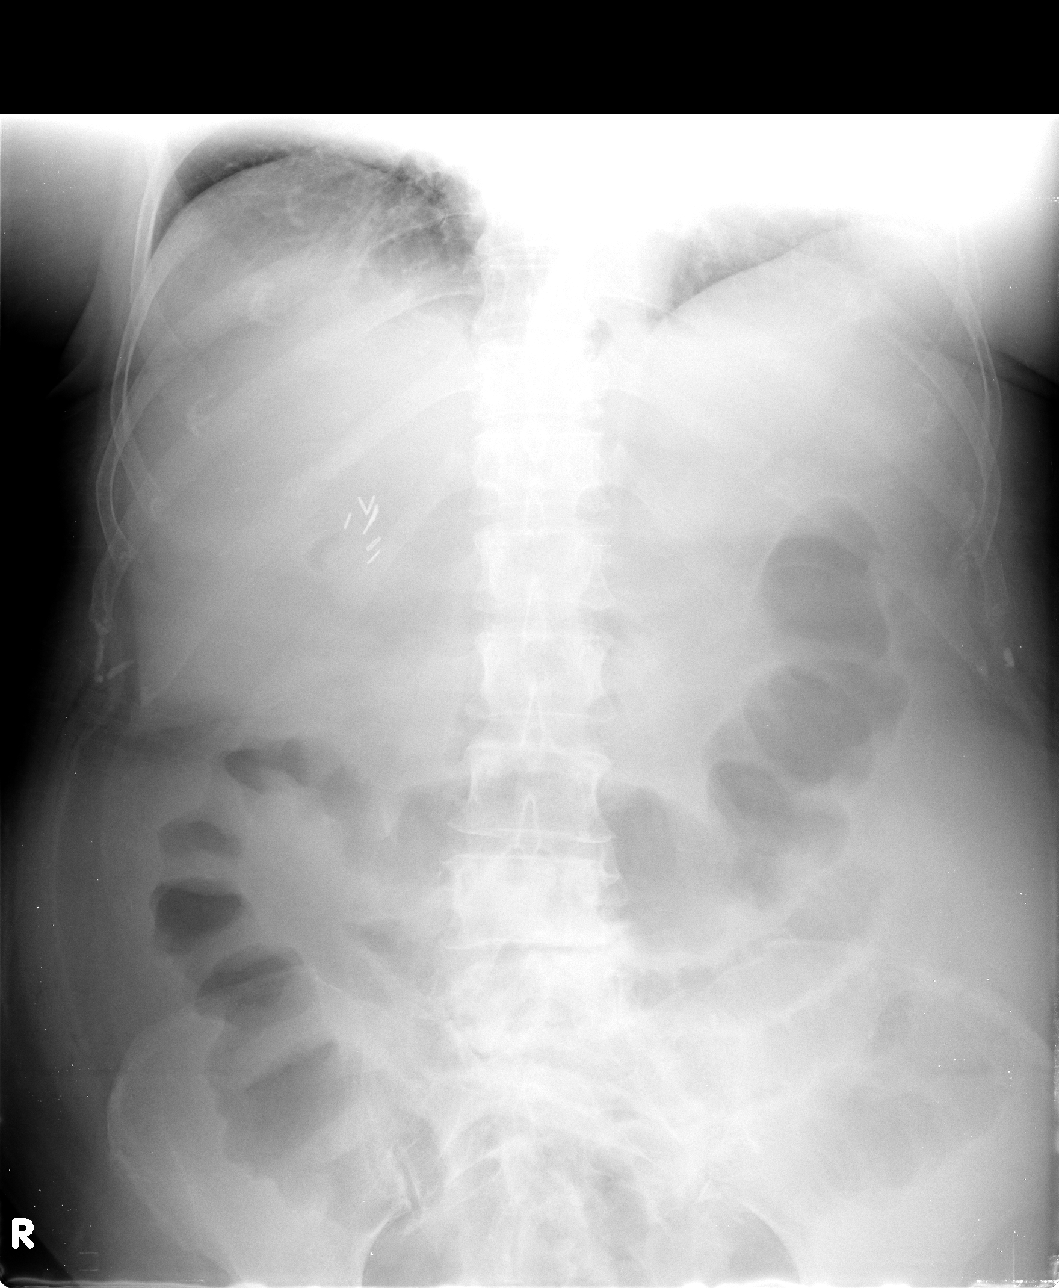

[3 of 3 positions shown; findings below may reference images not displayed]

FINDINGS: Stable cardiac and mediastinal contours. No consolidative pulmonary
opacities. No pleural effusion or pneumothorax. Regional skeleton is
unremarkable.

Mildly prominent loops of small bowel are demonstrated within the
central abdomen. There is suggestion of wall thickening of the
cecum, ascending and transverse colon. No free intraperitoneal air.
Cholecystectomy clips. Lower lumbar spine degenerative change. Right
hip intra medullary rod and screw fixation.
IMPRESSION: Suggestion of wall thickening of the visualized colon which may be
secondary to colitis.

Prominent loops of small bowel within the central abdomen may be
reflective of ileus

## 2015-11-14 MED FILL — POTASSIUM CL 10 MEQ TAB SA: 10 | 90 days supply | Qty: 90 | Fill #1

## 2015-11-18 ENCOUNTER — Encounter (HOSPITAL_BASED_OUTPATIENT_CLINIC_OR_DEPARTMENT_OTHER): Payer: Self-pay

## 2015-11-18 ENCOUNTER — Encounter: Payer: Self-pay | Admitting: Family

## 2015-11-18 ENCOUNTER — Other Ambulatory Visit: Payer: Self-pay | Admitting: Family Medicine

## 2015-11-18 ENCOUNTER — Other Ambulatory Visit: Payer: Self-pay | Admitting: Family

## 2015-11-18 ENCOUNTER — Ambulatory Visit (HOSPITAL_BASED_OUTPATIENT_CLINIC_OR_DEPARTMENT_OTHER)
Admission: RE | Admit: 2015-11-18 | Discharge: 2015-11-18 | Disposition: A | Discharge status: Home / Self Care | Payer: PPO | Source: Ambulatory Visit | Attending: Family | Admitting: Family

## 2015-11-18 ENCOUNTER — Ambulatory Visit (INDEPENDENT_AMBULATORY_CARE_PROVIDER_SITE_OTHER): Payer: PPO | Admitting: Family

## 2015-11-18 VITALS — BP 100/70 | HR 74 | Temp 97.6°F | Resp 16 | Ht 62.0 in | Wt 153.4 lb

## 2015-11-18 DIAGNOSIS — R197 Diarrhea, unspecified: Secondary | ICD-10-CM | POA: Diagnosis not present

## 2015-11-18 DIAGNOSIS — R109 Unspecified abdominal pain: Secondary | ICD-10-CM | POA: Diagnosis not present

## 2015-11-18 DIAGNOSIS — K5730 Diverticulosis of large intestine without perforation or abscess without bleeding: Secondary | ICD-10-CM | POA: Diagnosis not present

## 2015-11-18 DIAGNOSIS — I700 Atherosclerosis of aorta: Secondary | ICD-10-CM | POA: Diagnosis not present

## 2015-11-18 DIAGNOSIS — K5790 Diverticulosis of intestine, part unspecified, without perforation or abscess without bleeding: Secondary | ICD-10-CM | POA: Diagnosis not present

## 2015-11-18 DIAGNOSIS — I2510 Atherosclerotic heart disease of native coronary artery without angina pectoris: Secondary | ICD-10-CM | POA: Diagnosis not present

## 2015-11-18 LAB — COMPREHENSIVE METABOLIC PANEL
ALT: 12 U/L (ref 0–35)
AST: 16 U/L (ref 0–37)
Albumin: 3.8 g/dL (ref 3.5–5.2)
Alkaline Phosphatase: 81 U/L (ref 39–117)
BUN: 16 mg/dL (ref 6–23)
CHLORIDE: 107 meq/L (ref 96–112)
CO2: 29 meq/L (ref 19–32)
Calcium: 9.7 mg/dL (ref 8.4–10.5)
Creatinine, Ser: 0.81 mg/dL (ref 0.40–1.20)
GFR: 73.34 mL/min (ref >60.00)
GLUCOSE: 119 mg/dL — AB (ref 70–99)
POTASSIUM: 5 meq/L (ref 3.5–5.1)
SODIUM: 141 meq/L (ref 135–145)
TOTAL PROTEIN: 6.7 g/dL (ref 6.0–8.3)
Total Bilirubin: 0.3 mg/dL (ref 0.2–1.2)

## 2015-11-18 LAB — URINALYSIS, ROUTINE W REFLEX MICROSCOPIC
HGB URINE DIPSTICK: NEGATIVE
Ketones, ur: NEGATIVE
Leukocytes, UA: NEGATIVE
Nitrite: NEGATIVE
RBC / HPF: NONE SEEN (ref 0–?)
Specific Gravity, Urine: 1.025 (ref 1.000–1.030)
Total Protein, Urine: 30 — AB
URINE GLUCOSE: NEGATIVE
Urobilinogen, UA: 1 (ref 0.0–1.0)
pH: 5.5 (ref 5.0–8.0)

## 2015-11-18 LAB — CBC WITH DIFFERENTIAL/PLATELET
BASOS PCT: 0.4 % (ref 0.0–3.0)
Basophils Absolute: 0 10*3/uL (ref 0.0–0.1)
EOS PCT: 4.2 % (ref 0.0–5.0)
Eosinophils Absolute: 0.4 10*3/uL (ref 0.0–0.7)
HCT: 42.9 % (ref 36.0–46.0)
Hemoglobin: 14.3 g/dL (ref 12.0–15.0)
LYMPHS ABS: 3 10*3/uL (ref 0.7–4.0)
Lymphocytes Relative: 31.2 % (ref 12.0–46.0)
MCHC: 33.4 g/dL (ref 30.0–36.0)
MCV: 88.9 fl (ref 78.0–100.0)
MONO ABS: 0.6 10*3/uL (ref 0.1–1.0)
MONOS PCT: 6 % (ref 3.0–12.0)
NEUTROS ABS: 5.7 10*3/uL (ref 1.4–7.7)
NEUTROS PCT: 58.2 % (ref 43.0–77.0)
Platelets: 370 10*3/uL (ref 150.0–400.0)
RBC: 4.83 Mil/uL (ref 3.87–5.11)
RDW: 15.4 % (ref 11.5–15.5)
WBC: 9.8 10*3/uL (ref 4.0–10.5)

## 2015-11-18 MED ORDER — IOPAMIDOL (ISOVUE-300) INJECTION 61%
100.0000 mL | Freq: Once | INTRAVENOUS | Status: AC | PRN
  Administered 2015-11-18: 100 mL via INTRAVENOUS

## 2015-11-18 NOTE — Progress Notes (Signed)
Subjective:    Patient ID: Miranda DickSylvia W [name], female    DOB: 1941/05/06, 75 y.o.   MRN: [id number]  HPI  [name] is a 75 yr old female who presents today with chief complaint of diarrhea. Diarrhea has been present for about 3 weeks.    Has tried immodium without improvement. She has not travelled outside of [place].  She denies associated fever, nausea/vomitting, or blood in stool.  She does report some abdominal pain which is noted bilateral lower abdomen.  She has had a "couple of bad accidents." Has had increased bowel sounds.  She reports appetite is "fair."  Denies recent antibiotic use.    Review of Systems See HPI  Past Medical History  Diagnosis Date  . Hyperlipidemia   . Hypertension   . Osteoporosis     pt cannot tolerate fosamax  . Arthritis     osteoarthritis  . Barrett's esophagus   . GERD (gastroesophageal reflux disease)   . Chronic obstructive bronchitis (HCC)     Dr Delford FieldWright  . IFG (impaired fasting glucose)   . Colon polyps   . History of nephrolithiasis   . History of chicken pox   . Positive PPD     exposure to 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 Positive PPD only, negative CXR.  [name]. History of transfusion of packed red blood cells   . Urinary incontinence   . Colitis   . Chronic kidney disease     kidney stones  . Cataract     bilateral cateracts removed     Social History   Social History  . Marital Status: Widowed    Spouse Name: N/A  . Number of Children: N/A  . Years of Education: N/A   Occupational History  . Retired    Social History Main Topics  . Smoking status: Former Smoker -- 15 years    Quit date: 05/15/1967  . Smokeless tobacco: Never Used  . Alcohol Use: No  . Drug Use: No  . Sexual Activity: Not on file   Other Topics Concern  . Not on file   Social History Narrative   Regular exercise: yes    Past Surgical History  Procedure Laterality Date  . Abdominal hysterectomy  1970  . Appendectomy    . Cholecystectomy    . Total knee  arthroplasty  04/2006    total left knee replacement  . Leg surgery  2008    right    Family History  Problem Relation Age of Onset  . Coronary artery disease Mother   . Arthritis Mother   . Tuberculosis    . Asthma Maternal Grandmother   . Colon cancer Neg Hx   . Esophageal cancer Neg Hx   . Rectal cancer Neg Hx   . Stomach cancer Neg Hx     Allergies  Allergen Reactions  . Atorvastatin Other (See Comments)    REACTION: MUSCLE PAINS  . Diclofenac-Misoprostol Diarrhea and Nausea And Vomiting  . Fenofibrate Other (See Comments)    REACTION: carpopedal spasms  . Rosuvastatin Other (See Comments)    REACTION: MUSCLE PAINS  . Statins Other (See Comments)    myalgias  . Amoxicillin-Pot Clavulanate Other (See Comments)    REACTION: unspecified  . Penicillins Hives  . Sulfonamide Derivatives Hives  . Tuberculin Ppd Other (See Comments)    Redness at sight    Current Outpatient Prescriptions on File Prior to Visit  Medication Sig Dispense Refill  . amLODipine (NORVASC) 5 MG tablet TAKE 1  TABLET (5 MG TOTAL) BY MOUTH DAILY. 90 tablet 1  . Ascorbic Acid (VITAMIN C) 1000 MG tablet Take 1,000 mg by mouth daily.    [name]. aspirin 325 MG tablet Take 325 mg by mouth daily.      . beta carotene 15 MG capsule Take 15 mg by mouth daily.      . bifidobacterium infantis (ALIGN) capsule Take 1 capsule by mouth daily.      . Calcium Carbonate-Vitamin D (CALTRATE 600+D) 600-400 MG-UNIT per tablet Take 1 tablet by mouth 2 (two) times daily.    . carboxymethylcellulose (REFRESH PLUS) 0.5 % SOLN Place 1 drop into both eyes 2 (two) times daily.    . Coenzyme Q10 200 MG capsule Take 200 mg by mouth daily.      [name]. ibuprofen (ADVIL,MOTRIN) 200 MG tablet Take 800 mg by mouth daily.    [name]. KLOR-CON M10 10 MEQ tablet TAKE 1 TABLET (10 MEQ TOTAL) BY MOUTH DAILY. 90 tablet 1  . Multiple Vitamin (MULTIVITAMIN) tablet Take 1 tablet by mouth daily.      . niacin 500 MG tablet Take 1,500 mg by mouth at bedtime.     . Omega-3 Fatty Acids (FISH OIL) 1000 MG CAPS Take 1 capsule by mouth every morning.     [name]. omeprazole (PRILOSEC) 20 MG capsule Take 1 capsule (20 mg total) by mouth See admin instructions. Takes 1 cap daily and then 1 cap as needed 180 capsule 1  . ondansetron (ZOFRAN ODT) 8 MG disintegrating tablet Take 1 tablet (8 mg total) by mouth every 8 (eight) hours as needed for nausea or vomiting. 9 tablet 0  . quinapril (ACCUPRIL) 20 MG tablet Take 1 tablet (20 mg total) by mouth at bedtime. 30 tablet 5  . vitamin E (VITAMIN E) 400 UNIT capsule Take 400 Units by mouth daily.       No current facility-administered medications on file prior to visit.    BP 100/70 mmHg  Pulse 74  Temp(Src) 97.6 F (36.4 C) (Oral)  Resp 16  Ht 5\' 2"  (1.575 m)  Wt 153 lb 6.4 oz (69.582 kg)  BMI 28.05 kg/m2  SpO2 96%  LMP 05/14/1968       Objective:   Physical Exam  Constitutional: She is oriented to person, place, and time. She appears well-developed and well-nourished.  Cardiovascular: Normal rate, regular rhythm and normal heart sounds.   No murmur heard. Pulmonary/Chest: Effort normal and breath sounds normal. No respiratory distress. She has no wheezes.  Abdominal: Soft. Bowel sounds are normal.  + RLQ tenderness to palpation, + LLQ tenderness to palpation.   Neurological: She is alert and oriented to person, place, and time.  Psychiatric: She has a normal mood and affect. Her behavior is normal. Judgment and thought content normal.          Assessment & Plan:  Abdominal pain/diarrhea- need to rule out infectious diarrhea (send stool for culture, O and P, c diff) and diverticulitis (she has had this in the past).  Push fluids to prevent dehydration.  Obtain CMET, CBC, UA and culture.  Plan CT scan later this afternoon for further evaluation.

## 2015-11-18 NOTE — Patient Instructions (Addendum)
Please complete your lab work prior to leaving. Complete and return stool sample at your earliest convenience. Return this afternoon to complete your CT scan. We will contact you with your results.  Make sure to drink plenty of fluids. If you develop weakness, fever, worsening abdominal pain over the weekend please go to the ER. Check your blood pressure in the morning.  If your top number is <110, do not take your blood pressure medications.  Follow up with us on Monday.

## 2015-11-18 NOTE — Progress Notes (Signed)
Pre visit review using our clinic review tool, if applicable. No additional management support is needed unless otherwise documented below in the visit note. 

## 2015-11-18 NOTE — Addendum Note (Signed)
Addended by: Eustace QuailEABOLD, Zyair Rhein J on: 11/18/2015 01:03 PM   Modules accepted: Orders

## 2015-11-19 ENCOUNTER — Encounter: Payer: Self-pay | Admitting: Family

## 2015-11-19 LAB — CLOSTRIDIUM DIFFICILE BY PCR: CDIFFPCR: NOT DETECTED

## 2015-11-20 LAB — URINE CULTURE
Colony Count: NO GROWTH
Organism ID, Bacteria: NO GROWTH

## 2015-11-21 ENCOUNTER — Ambulatory Visit (INDEPENDENT_AMBULATORY_CARE_PROVIDER_SITE_OTHER): Payer: PPO | Admitting: Family

## 2015-11-21 ENCOUNTER — Encounter: Payer: Self-pay | Admitting: Family

## 2015-11-21 VITALS — BP 106/54 | HR 69 | Temp 98.3°F | Ht 62.0 in | Wt 156.8 lb

## 2015-11-21 DIAGNOSIS — A084 Viral intestinal infection, unspecified: Secondary | ICD-10-CM

## 2015-11-21 MED FILL — QUINAPRIL 20 MG TABLET: 20 | 30 days supply | Qty: 30 | Fill #2

## 2015-11-21 NOTE — Progress Notes (Signed)
Subjective:    Patient ID: [name], female    DOB: Nov 10, 1940, 75 y.o.   MRN: [id number]  HPI  Pt presents today for follow up of her diarrhea. She was seen on Friday and a CT of the abdomen/pelvis was performed.  Ct was unremarkable except for liquid stool in colon.  CBC, c diff, bmet all look ok. Stool negative for c diff and stool culture is neg to date.  She reports that she is currently using immodium 6-8 tabs a day. This is keeping diarrhea controlled.  She is tolerating PO's. Notes that using immodium last week at this dose did not help her symptoms as much. She reports improvement in her abdominal tenderness.   Review of Systems See HPI  Past Medical History  Diagnosis Date  . Hyperlipidemia   . Hypertension   . Osteoporosis     pt cannot tolerate fosamax  . Arthritis     osteoarthritis  . Barrett's esophagus   . GERD (gastroesophageal reflux disease)   . Chronic obstructive bronchitis (HCC)     Dr Delford FieldWright  . IFG (impaired fasting glucose)   . Colon polyps   . History of nephrolithiasis   . History of chicken pox   . Positive PPD     exposure to 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 Positive PPD only, negative CXR.  [name]. History of transfusion of packed red blood cells   . Urinary incontinence   . Colitis   . Chronic kidney disease     kidney stones  . Cataract     bilateral cateracts removed     Social History   Social History  . Marital Status: Widowed    Spouse Name: N/A  . Number of Children: N/A  . Years of Education: N/A   Occupational History  . Retired    Social History Main Topics  . Smoking status: Former Smoker -- 15 years    Quit date: 05/15/1967  . Smokeless tobacco: Never Used  . Alcohol Use: No  . Drug Use: No  . Sexual Activity: Not on file   Other Topics Concern  . Not on file   Social History Narrative   Regular exercise: yes    Past Surgical History  Procedure Laterality Date  . Abdominal hysterectomy  1970  . Appendectomy    .  Cholecystectomy    . Total knee arthroplasty  04/2006    total left knee replacement  . Leg surgery  2008    right    Family History  Problem Relation Age of Onset  . Coronary artery disease Mother   . Arthritis Mother   . Tuberculosis    . Asthma Maternal Grandmother   . Colon cancer Neg Hx   . Esophageal cancer Neg Hx   . Rectal cancer Neg Hx   . Stomach cancer Neg Hx     Allergies  Allergen Reactions  . Atorvastatin Other (See Comments)    REACTION: MUSCLE PAINS  . Diclofenac-Misoprostol Diarrhea and Nausea And Vomiting  . Fenofibrate Other (See Comments)    REACTION: carpopedal spasms  . Rosuvastatin Other (See Comments)    REACTION: MUSCLE PAINS  . Statins Other (See Comments)    myalgias  . Amoxicillin-Pot Clavulanate Other (See Comments)    REACTION: unspecified  . Penicillins Hives  . Sulfonamide Derivatives Hives  . Tuberculin Ppd Other (See Comments)    Redness at sight    Current Outpatient Prescriptions on File Prior to Visit  Medication Sig Dispense  Refill  . amLODipine (NORVASC) 5 MG tablet TAKE 1 TABLET (5 MG TOTAL) BY MOUTH DAILY. 90 tablet 1  . Ascorbic Acid (VITAMIN C) 1000 MG tablet Take 1,000 mg by mouth daily.    [name] aspirin 325 MG tablet Take 325 mg by mouth daily.      . beta carotene 15 MG capsule Take 15 mg by mouth daily.      . bifidobacterium infantis (ALIGN) capsule Take 1 capsule by mouth daily.      . Calcium Carbonate-Vitamin D (CALTRATE 600+D) 600-400 MG-UNIT per tablet Take 1 tablet by mouth 2 (two) times daily.    . carboxymethylcellulose (REFRESH PLUS) 0.5 % SOLN Place 1 drop into both eyes 2 (two) times daily.    . Coenzyme Q10 200 MG capsule Take 200 mg by mouth daily.      [name] ibuprofen (ADVIL,MOTRIN) 200 MG tablet Take 800 mg by mouth daily.    [name] KLOR-CON M10 10 MEQ tablet TAKE 1 TABLET (10 MEQ TOTAL) BY MOUTH DAILY. 90 tablet 1  . Multiple Vitamin (MULTIVITAMIN) tablet Take 1 tablet by mouth daily.      . niacin 500 MG tablet  Take 1,500 mg by mouth at bedtime.    . Omega-3 Fatty Acids (FISH OIL) 1000 MG CAPS Take 1 capsule by mouth every morning.     [name] omeprazole (PRILOSEC) 20 MG capsule Take 1 capsule (20 mg total) by mouth See admin instructions. Takes 1 cap daily and then 1 cap as needed 180 capsule 1  . ondansetron (ZOFRAN ODT) 8 MG disintegrating tablet Take 1 tablet (8 mg total) by mouth every 8 (eight) hours as needed for nausea or vomiting. 9 tablet 0  . quinapril (ACCUPRIL) 20 MG tablet Take 1 tablet (20 mg total) by mouth at bedtime. 30 tablet 5  . vitamin E (VITAMIN E) 400 UNIT capsule Take 400 Units by mouth daily.       No current facility-administered medications on file prior to visit.    BP 106/54 mmHg  Pulse 69  Temp(Src) 98.3 F (36.8 C) (Oral)  Ht  (1.575 m)  Wt 156 lb 12.8 oz (71.124 kg)  BMI 28.67 kg/m2  SpO2 97%  LMP 05/14/1968       Objective:   Physical Exam  Constitutional: She is oriented to person, place, and time. She appears well-developed and well-nourished.  Cardiovascular: Normal rate, regular rhythm and normal heart sounds.   No murmur heard. Pulmonary/Chest: Effort normal and breath sounds normal. No respiratory distress. She has no wheezes.  Abdominal: Soft. Bowel sounds are normal. She exhibits no distension. There is no tenderness. There is no rebound.  Neurological: She is alert and oriented to person, place, and time.  Psychiatric: She has a normal mood and affect. Her behavior is normal. Judgment and thought content normal.          Assessment & Plan:  Viral gastroenteritis/Diarrhea- improving. Suspect resolving viral gastroenteritis. I advised pt to try to wean off of immodium over the next few days and to call if her diarrhea is not resolved by Friday. Would plan GI referral at that time.

## 2015-11-21 NOTE — Progress Notes (Signed)
Pre visit review using our clinic review tool, if applicable. No additional management support is needed unless otherwise documented below in the visit note. 

## 2015-11-21 NOTE — Patient Instructions (Signed)
Try to ease off of immodium.  Call if your diarrhea does not continue to improve.  If not back to normal by Friday, call me and I will place a referral to GI.

## 2015-11-22 LAB — STOOL CULTURE

## 2015-11-22 LAB — OVA AND PARASITE EXAMINATION: OP: NONE SEEN

## 2015-12-15 MED FILL — QUINAPRIL 20 MG TABLET: 20 | 30 days supply | Qty: 30 | Fill #3

## 2015-12-26 ENCOUNTER — Other Ambulatory Visit: Payer: Self-pay | Admitting: Family

## 2015-12-28 MED FILL — OMEPRAZOLE DR 20 MG CAPSULE: 20 | 90 days supply | Qty: 180 | Fill #0

## 2015-12-28 MED FILL — AMLODIPINE BESYLATE 5 MG TA: 5 | 90 days supply | Qty: 90 | Fill #0

## 2016-01-17 MED FILL — QUINAPRIL 20 MG TABLET: 20 | 30 days supply | Qty: 30 | Fill #4

## 2016-02-08 ENCOUNTER — Other Ambulatory Visit: Payer: Self-pay | Admitting: Family

## 2016-02-08 MED FILL — POTASSIUM CL 10 MEQ TAB SA: 10 | 90 days supply | Qty: 90 | Fill #0

## 2016-02-16 MED FILL — QUINAPRIL 20 MG TABLET: 20 | 30 days supply | Qty: 30 | Fill #5

## 2016-03-16 ENCOUNTER — Encounter: Payer: Self-pay | Admitting: Gastroenterology

## 2016-03-19 ENCOUNTER — Other Ambulatory Visit: Payer: Self-pay | Admitting: Family

## 2016-03-19 MED FILL — QUINAPRIL 20 MG TABLET: 20 | 90 days supply | Qty: 90 | Fill #0

## 2016-03-19 NOTE — Telephone Encounter (Signed)
Medication filled to pharmacy as requested.   

## 2016-03-20 ENCOUNTER — Ambulatory Visit: Payer: PPO | Admitting: Family

## 2016-03-23 ENCOUNTER — Ambulatory Visit: Payer: PPO | Admitting: Family

## 2016-03-26 MED FILL — AMLODIPINE BESYLATE 5 MG TA: 5 | 90 days supply | Qty: 90 | Fill #1

## 2016-03-27 ENCOUNTER — Encounter: Payer: Self-pay | Admitting: Family

## 2016-03-27 ENCOUNTER — Ambulatory Visit (INDEPENDENT_AMBULATORY_CARE_PROVIDER_SITE_OTHER): Payer: PPO | Admitting: Family

## 2016-03-27 VITALS — BP 118/54 | HR 72 | Temp 97.9°F | Resp 16 | Ht 62.0 in | Wt 152.4 lb

## 2016-03-27 DIAGNOSIS — Z23 Encounter for immunization: Secondary | ICD-10-CM | POA: Diagnosis not present

## 2016-03-27 DIAGNOSIS — M810 Age-related osteoporosis without current pathological fracture: Secondary | ICD-10-CM

## 2016-03-27 DIAGNOSIS — R739 Hyperglycemia, unspecified: Secondary | ICD-10-CM | POA: Diagnosis not present

## 2016-03-27 DIAGNOSIS — K219 Gastro-esophageal reflux disease without esophagitis: Secondary | ICD-10-CM

## 2016-03-27 DIAGNOSIS — E7800 Pure hypercholesterolemia, unspecified: Secondary | ICD-10-CM

## 2016-03-27 LAB — LIPID PANEL
CHOLESTEROL: 314 mg/dL — AB (ref 0–200)
HDL: 71.2 mg/dL (ref >39.00)
LDL Cholesterol: 212 mg/dL — ABNORMAL HIGH (ref 0–99)
NONHDL: 242.51
Total CHOL/HDL Ratio: 4
Triglycerides: 152 mg/dL — ABNORMAL HIGH (ref 0.0–149.0)
VLDL: 30.4 mg/dL (ref 0.0–40.0)

## 2016-03-27 LAB — BASIC METABOLIC PANEL
BUN: 13 mg/dL (ref 6–23)
CALCIUM: 9.5 mg/dL (ref 8.4–10.5)
CO2: 26 mEq/L (ref 19–32)
CREATININE: 0.61 mg/dL (ref 0.40–1.20)
Chloride: 98 mEq/L (ref 96–112)
GFR: 101.64 mL/min (ref >60.00)
GLUCOSE: 86 mg/dL (ref 70–99)
POTASSIUM: 3.8 meq/L (ref 3.5–5.1)
Sodium: 134 mEq/L — ABNORMAL LOW (ref 135–145)

## 2016-03-27 LAB — HEMOGLOBIN A1C: HEMOGLOBIN A1C: 5.4 % (ref 4.6–6.5)

## 2016-03-27 NOTE — Assessment & Plan Note (Signed)
>>  ASSESSMENT AND PLAN FOR HYPERCHOLESTEROLEMIA WRITTEN ON 03/27/2016  1:47 PM BY O'SULLIVAN, Jonhatan Hearty, NP  Unable to tolerate statin. Continue niacin .  Obtain follow up FLP. Continue low fat/low cholesterol diet.

## 2016-03-27 NOTE — Progress Notes (Signed)
Subjective:    Patient ID: [name], female    DOB: 1940-09-10, 75 y.o.   MRN: [id number]  HPI  [name] is a 75 yr old female who presents today for follow up.   Osteoporosis- on caltrate twice daily.  Has been exercising more .   HTN- on amlodipine and accupril. Denies CP/SOB or swelling BP Readings from Last 3 Encounters:  03/27/16 (!) 118/54  11/21/15 (!) 106/54  11/18/15 100/70    Hyperlipidemia- was non-fasting last visit. She is fasting today. On niacin.  Working on low cholesterol diet.   Lab Results  Component Value Date   CHOL 330 (H) 09/20/2015   HDL 61.60 09/20/2015   LDLCALC 234 (H) 09/20/2015   LDLDIRECT 158.8 06/10/2008   TRIG 170.0 (H) 09/20/2015   CHOLHDL 5 09/20/2015   GERD- stable on omeprazole 20mg  once daily.  Reports that she has not tolerated trying to come off of the medicaiton  Review of Systems See HPI  Past Medical History:  Diagnosis Date  . Arthritis    osteoarthritis  . Barrett's esophagus   . Cataract    bilateral cateracts removed  . Chronic kidney disease    kidney stones  . Chronic obstructive bronchitis (HCC)    Dr Delford FieldWright  . Colitis   . Colon polyps   . GERD (gastroesophageal reflux disease)   . History of chicken pox   . History of nephrolithiasis   . History of transfusion of packed red blood cells   . Hyperlipidemia   . Hypertension   . IFG (impaired fasting glucose)   . Osteoporosis    pt cannot tolerate fosamax  . 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  [name]. Urinary incontinence      Social History   Social History  . Marital status: Widowed    Spouse name: N/A  . Number of children: N/A  . Years of education: N/A   Occupational History  . Retired    Social History Main Topics  . Smoking status: Former Smoker    Years: 15.00    Quit date: 05/15/1967  . Smokeless tobacco: Never Used  . Alcohol use No  . Drug use: No  . Sexual activity: Not on file    Other Topics Concern  . Not on file   Social History Narrative   Regular exercise: yes    Past Surgical History:  Procedure Laterality Date  . ABDOMINAL HYSTERECTOMY  1970  . APPENDECTOMY    . CHOLECYSTECTOMY    . LEG SURGERY  2008   right  . TOTAL KNEE ARTHROPLASTY  04/2006   total left knee replacement    Family History  Problem Relation Age of Onset  . Coronary artery disease Mother   . Arthritis Mother   . Tuberculosis    . Asthma Maternal Grandmother   . Colon cancer Neg Hx   . Esophageal cancer Neg Hx   . Rectal cancer Neg Hx   . Stomach cancer Neg Hx     Allergies  Allergen Reactions  . Atorvastatin Other (See Comments)    REACTION: MUSCLE PAINS  . Diclofenac-Misoprostol Diarrhea and Nausea And Vomiting  . Fenofibrate Other (See Comments)    REACTION: carpopedal spasms  . Rosuvastatin Other (See Comments)    REACTION: MUSCLE PAINS  . Statins Other (See Comments)    myalgias  . Amoxicillin-Pot Clavulanate Other (See Comments)    REACTION: unspecified  . Penicillins Hives  .  Sulfonamide Derivatives Hives  . Tuberculin Ppd Other (See Comments)    Redness at sight    Current Outpatient Prescriptions on File Prior to Visit  Medication Sig Dispense Refill  . amLODipine (NORVASC) 5 MG tablet TAKE 1 TABLET (5 MG TOTAL) BY MOUTH DAILY. 90 tablet 1  . Ascorbic Acid (VITAMIN C) 1000 MG tablet Take 1,000 mg by mouth daily.    [name]. aspirin 325 MG tablet Take 325 mg by mouth daily.      . beta carotene 15 MG capsule Take 15 mg by mouth daily.      . bifidobacterium infantis (ALIGN) capsule Take 1 capsule by mouth daily.      . Calcium Carbonate-Vitamin D (CALTRATE 600+D) 600-400 MG-UNIT per tablet Take 1 tablet by mouth 2 (two) times daily.    . carboxymethylcellulose (REFRESH PLUS) 0.5 % SOLN Place 1 drop into both eyes 2 (two) times daily.    . Coenzyme Q10 200 MG capsule Take 200 mg by mouth daily.      [name]. ibuprofen (ADVIL,MOTRIN) 200 MG tablet Take 800 mg by  mouth daily.    . Multiple Vitamin (MULTIVITAMIN) tablet Take 1 tablet by mouth daily.      . niacin 500 MG tablet Take 1,500 mg by mouth at bedtime.    . Omega-3 Fatty Acids (FISH OIL) 1000 MG CAPS Take 1 capsule by mouth every morning.     [name]. omeprazole (PRILOSEC) 20 MG capsule TAKE 1 CAPSULE BY MOUTH DAILY, THEN 1 CAPSULE AS NEEDED 180 capsule 1  . ondansetron (ZOFRAN ODT) 8 MG disintegrating tablet Take 1 tablet (8 mg total) by mouth every 8 (eight) hours as needed for nausea or vomiting. 9 tablet 0  . potassium chloride (K-DUR,KLOR-CON) 10 MEQ tablet TAKE 1 TABLET (10 MEQ TOTAL) BY MOUTH DAILY. 90 tablet 1  . quinapril (ACCUPRIL) 20 MG tablet TAKE 1 TABLET BY MOUTH AT BEDTIME 90 tablet 1  . vitamin E (VITAMIN E) 400 UNIT capsule Take 400 Units by mouth daily.       No current facility-administered medications on file prior to visit.     BP (!) 118/54 (BP Location: Right Arm, Patient Position: Sitting, Cuff Size: Normal)   Pulse 72   Temp 97.9 F (36.6 C) (Oral)   Resp 16   Ht 5\' 2"  (1.575 m)   Wt 152 lb 6.4 oz (69.1 kg)   LMP 05/14/1968   SpO2 99% Comment: RA  BMI 27.87 kg/m       Objective:   Physical Exam  Constitutional: She is oriented to person, place, and time. She appears well-developed and well-nourished.  HENT:  Head: Normocephalic.  Cardiovascular: Normal rate, regular rhythm and normal heart sounds.   No murmur heard. Pulmonary/Chest: Effort normal and breath sounds normal. No respiratory distress. She has no wheezes.  Neurological: She is alert and oriented to person, place, and time.  Psychiatric: She has a normal mood and affect. Her behavior is normal. Judgment and thought content normal.          Assessment & Plan:   Hyperglycemia- obtain follow up A1C Lab Results  Component Value Date   HGBA1C 5.9 09/20/2015

## 2016-03-27 NOTE — Progress Notes (Signed)
Pre visit review using our clinic review tool, if applicable. No additional management support is needed unless otherwise documented below in the visit note. 

## 2016-03-27 NOTE — Assessment & Plan Note (Signed)
Stable on PPI, continue same.  

## 2016-03-27 NOTE — Assessment & Plan Note (Signed)
Continue caltrate and weight bearing exercises.

## 2016-03-27 NOTE — Assessment & Plan Note (Signed)
Unable to tolerate statin. Continue niacin.  Obtain follow up FLP. Continue low fat/low cholesterol diet.

## 2016-03-27 NOTE — Patient Instructions (Signed)
Please complete lab work prior to leaving.  Keep up the good work with diet/exericse and weight loss.

## 2016-03-28 ENCOUNTER — Telehealth: Payer: Self-pay | Admitting: Family

## 2016-03-28 DIAGNOSIS — E785 Hyperlipidemia, unspecified: Secondary | ICD-10-CM

## 2016-03-28 NOTE — Telephone Encounter (Signed)
Notified pt and she is agreeable to proceed with Lipid Clinic referral.

## 2016-03-28 NOTE — Telephone Encounter (Signed)
Cholesterol is very high. Since she is intolerant to statins, I would like to refer her to the lipid clinic for further recommendations. Sugar is improved.  Kidney function is stable.

## 2016-04-18 ENCOUNTER — Ambulatory Visit: Payer: PPO | Admitting: Cardiology

## 2016-05-01 ENCOUNTER — Telehealth: Payer: Self-pay | Admitting: Family

## 2016-05-01 MED ORDER — ZOSTER VACCINE LIVE 19400 UNT/0.65ML ~~LOC~~ SUSR: 0.6500 mL | Freq: Once | SUBCUTANEOUS | 0 refills | Status: DC

## 2016-05-01 NOTE — Telephone Encounter (Signed)
rx sent

## 2016-05-01 NOTE — Telephone Encounter (Signed)
Caller name: [name] Relationship to patient: self Can be reached: [phone] [facility]  Reason for call: pt [facility]. She was told by the pharmacy that we need to send RX to the pharmacy.

## 2016-05-02 MED ORDER — ZOSTER VAC RECOMB ADJUVANTED 50 MCG/0.5ML IM SUSR: 50.0000 ug | Freq: Once | INTRAMUSCULAR | 0 refills | Status: AC

## 2016-05-02 MED ORDER — ZOSTER VAC RECOMB ADJUVANTED 50 MCG/0.5ML IM SUSR: 50.0000 ug | Freq: Once | INTRAMUSCULAR | 0 refills | Status: DC

## 2016-05-02 NOTE — Telephone Encounter (Signed)
Left detailed message on cell voicemail and to call if any questions. 

## 2016-05-02 NOTE — Telephone Encounter (Signed)
Received call from [facility] stating pt is there now and is asking for the new Shingles vaccine (shingrix). , and comes in 2 doses. They are asking rx for 1 injection x 1 refill.  New vaccine is supposed to be 95% effective.  Please advise?

## 2016-05-02 NOTE — Telephone Encounter (Signed)
rx sent

## 2016-05-02 NOTE — Addendum Note (Signed)
Addended by: Mervin KungFERGERSON, Zakk Borgen A on: 05/02/2016 03:11 PM   Modules accepted: Orders

## 2016-05-02 NOTE — Addendum Note (Signed)
Addended by: Sandford Craze'SULLIVAN, Rafan Sanders on: 05/02/2016 03:01 PM   Modules accepted: Orders

## 2016-05-02 NOTE — Telephone Encounter (Signed)
Notified pharmacist that Rx had been sent and she requested that we send 1 additional Rx as 2 doses are needed to complete the set. Refill sent and she will place on hold.

## 2016-05-03 ENCOUNTER — Telehealth: Payer: Self-pay | Admitting: Family

## 2016-05-03 NOTE — Telephone Encounter (Signed)
Caller name: Emica  Relation to pt: from [facility]  Call back number: [phone] / fax [phone]   Reason for call:  Patient requesting "Shingrix". Medication in need of PA, form faxed over 05/02/16 a courtesy call was made to see if PA could be conducted over the phone, please advise

## 2016-05-04 NOTE — Telephone Encounter (Signed)
Left message for Emica to return my call. Received fax from [facility] that shingrix is non-formulary. Awaiting return call.

## 2016-05-04 NOTE — Telephone Encounter (Signed)
Attempted to reach [name] at below number and left detailed message requesting return call with detailed instructions on the next step from here since Shingrix appears to be non-formulary. Awaiting return call.

## 2016-05-04 NOTE — Telephone Encounter (Signed)
[facility] returned your call. She would like a call back.    Phone: [phone]

## 2016-05-08 ENCOUNTER — Telehealth: Payer: Self-pay | Admitting: Family

## 2016-05-08 NOTE — Telephone Encounter (Signed)
Jasmine DecemberSharon from [facility] calling back to follow up on request for Shingrix. Plse call [phone]

## 2016-05-08 NOTE — Telephone Encounter (Signed)
error:[id number] ° °

## 2016-05-15 MED ORDER — ZOSTER VACCINE LIVE 19400 UNT/0.65ML ~~LOC~~ SUSR: 0.6500 mL | Freq: Once | SUBCUTANEOUS | 0 refills | Status: AC

## 2016-05-15 NOTE — Addendum Note (Signed)
Addended by: Mervin KungFERGERSON, Jamier Urbas A on: 05/15/2016 11:57 AM   Modules accepted: Orders

## 2016-05-15 NOTE — Telephone Encounter (Signed)
[facility] shingrix is not on pt's formulary. Formulary alternative is zostavax.  Notified pt and she is agreeable to proceed with Zostavax. Rx sent.

## 2016-05-16 MED FILL — POTASSIUM CL 10 MEQ TAB SA: 10 | 90 days supply | Qty: 90 | Fill #1

## 2016-05-29 ENCOUNTER — Telehealth: Payer: Self-pay | Admitting: Family

## 2016-05-29 NOTE — Telephone Encounter (Signed)
Called patient to schedule awv with health coach. Left msg for patient to call office to schedule appt.  °

## 2016-06-15 ENCOUNTER — Other Ambulatory Visit: Payer: Self-pay | Admitting: Family

## 2016-06-15 MED FILL — QUINAPRIL 20 MG TABLET: 20 | 90 days supply | Qty: 90 | Fill #1

## 2016-06-15 MED FILL — OMEPRAZOLE DR 20 MG CAPSULE: 20 | 90 days supply | Qty: 180 | Fill #1

## 2016-06-15 MED FILL — AMLODIPINE BESYLATE 5 MG TA: 5 | 90 days supply | Qty: 90 | Fill #0

## 2016-08-01 ENCOUNTER — Telehealth: Payer: Self-pay | Admitting: Family

## 2016-08-01 NOTE — Telephone Encounter (Signed)
Left pt message asking to call Allison back directly at [phone] to schedule AWV. Thanks! °

## 2016-08-10 ENCOUNTER — Other Ambulatory Visit: Payer: Self-pay | Admitting: Family

## 2016-08-13 MED FILL — POTASSIUM CL 10 MEQ TAB SA: 10 | 90 days supply | Qty: 90 | Fill #0

## 2016-08-25 DIAGNOSIS — M25552 Pain in left hip: Secondary | ICD-10-CM | POA: Diagnosis not present

## 2016-09-03 ENCOUNTER — Other Ambulatory Visit: Payer: Self-pay | Admitting: Family

## 2016-09-03 MED FILL — QUINAPRIL 20 MG TABLET: 20 | 90 days supply | Qty: 90 | Fill #0

## 2016-09-03 NOTE — Telephone Encounter (Signed)
Refill sent per [facility] refill protocol/SLS Last OV: 03/27/16 ROV: 09/24/16 Last Rx Refill: 03/19/16, #90x1

## 2016-09-10 DIAGNOSIS — M48062 Spinal stenosis, lumbar region with neurogenic claudication: Secondary | ICD-10-CM | POA: Diagnosis not present

## 2016-09-17 ENCOUNTER — Other Ambulatory Visit: Payer: Self-pay | Admitting: Family

## 2016-09-18 MED FILL — AMLODIPINE BESYLATE 5 MG TA: 5 | 90 days supply | Qty: 90 | Fill #0

## 2016-09-18 NOTE — Telephone Encounter (Signed)
eScribe request from [facility] for refill on Amlodipine Last filled - 06/15/16, #90x0 Last AEX - 03/27/16 Next AEX - 6-Mths, pt has appt 09/24/16 [facility]

## 2016-09-20 DIAGNOSIS — M48062 Spinal stenosis, lumbar region with neurogenic claudication: Secondary | ICD-10-CM | POA: Diagnosis not present

## 2016-09-20 NOTE — Telephone Encounter (Signed)
Left pt message asking to call Revonda Standardllison back directly at [phone] to schedule AWV. Thanks!  appt 5/14--asked pt if she can come in at 10am for AWV?

## 2016-09-21 NOTE — Progress Notes (Signed)
Subjective:   [name] is a 76 y.o. female who presents for Medicare Annual (Subsequent) preventive examination.  Review of Systems:  No ROS.  Medicare Wellness Visit. Cardiac Risk Factors include: advanced age (>2255men, 44>65 women);hypertension;sedentary lifestyle Sleep patterns: Sleeps 5-6 hrs per night and naps if needed. Home Safety/Smoke Alarms:  Wears life alert bracelet. Feels safe in home. Smoke alarms in place.  Living environment; residence and Firearm Safety: Lives alone. No stairs Guns safely stored. Seat Belt Safety/Bike Helmet: Wears seat belt.   Counseling:   Eye Exam- Wearing glasses. Hx cataract sx. Dr. Lucretia RoersWood yearly. Dental-Upper and lower dentures. Gum care discussed.  Female:   Pap- Hysterectomy. Mammo-  Last 02/01/15: BI-RADS CATEGORY  1: Negative.  Ordered today.   Dexa scan-    Last 02/01/15: No result on file. CCS- Last 03/21/07: mild diverticulosis     Objective:     Vitals: BP (!) 156/82 (BP Location: Right Arm, Patient Position: Sitting, Cuff Size: Normal)   Pulse 90   Ht 5\' 2"  (1.575 m)   Wt 172 lb (78 kg)   LMP 05/14/1968   SpO2 98%   BMI 31.46 kg/m   Body mass index is 31.46 kg/m.   Tobacco History  Smoking Status  . Former Smoker  . Years: 15.00  . Quit date: 05/15/1967  Smokeless Tobacco  . Never Used     Counseling given: Not Answered   Past Medical History:  Diagnosis Date  . Arthritis    osteoarthritis  . Barrett's esophagus   . Cataract    bilateral cateracts removed  . Chronic kidney disease    kidney stones  . Chronic obstructive bronchitis (HCC)    Dr Delford FieldWright  . Colitis   . Colon polyps   . GERD (gastroesophageal reflux disease)   . History of chicken pox   . History of nephrolithiasis   . History of transfusion of packed red blood cells   . Hyperlipidemia   . Hypertension   . IFG (impaired fasting glucose)   . Osteoporosis    pt cannot tolerate fosamax  . 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  Positive PPD only, negative CXR.  [name]. Urinary incontinence    Past Surgical History:  Procedure Laterality Date  . ABDOMINAL HYSTERECTOMY  1970  . APPENDECTOMY    . CHOLECYSTECTOMY    . LEG SURGERY  2008   right  . TOTAL KNEE ARTHROPLASTY  04/2006   total left knee replacement   Family History  Problem Relation Age of Onset  . Coronary artery disease Mother   . Arthritis Mother   . Tuberculosis Unknown   . Asthma Maternal Grandmother   . Colon cancer Neg Hx   . Esophageal cancer Neg Hx   . Rectal cancer Neg Hx   . Stomach cancer Neg Hx    History  Sexual Activity  . Sexual activity: No    Outpatient Encounter Prescriptions as of 09/24/2016  Medication Sig  . amLODipine (NORVASC) 5 MG tablet TAKE 1 TABLET (5 MG TOTAL) BY MOUTH DAILY.  [name]. Ascorbic Acid (VITAMIN C) 1000 MG tablet Take 1,000 mg by mouth daily.  [name]. aspirin 325 MG tablet Take 325 mg by mouth daily.    . beta carotene 15 MG capsule Take 15 mg by mouth daily.    . bifidobacterium infantis (ALIGN) capsule Take 1 capsule by mouth daily.    . Calcium Carbonate-Vitamin D (CALTRATE 600+D) 600-400 MG-UNIT per tablet Take 1  tablet by mouth 2 (two) times daily.  . carboxymethylcellulose (REFRESH PLUS) 0.5 % SOLN Place 1 drop into both eyes 2 (two) times daily.  . Coenzyme Q10 200 MG capsule Take 200 mg by mouth daily.    [name] ibuprofen (ADVIL,MOTRIN) 200 MG tablet Take 800 mg by mouth daily.  . Multiple Vitamin (MULTIVITAMIN) tablet Take 1 tablet by mouth daily.    . niacin 500 MG tablet Take 1,500 mg by mouth at bedtime.  . Omega-3 Fatty Acids (FISH OIL) 1000 MG CAPS Take 1 capsule by mouth every morning.   [name] omeprazole (PRILOSEC) 20 MG capsule TAKE 1 CAPSULE BY MOUTH DAILY, THEN 1 CAPSULE AS NEEDED  . ondansetron (ZOFRAN ODT) 8 MG disintegrating tablet Take 1 tablet (8 mg total) by mouth every 8 (eight) hours as needed for nausea or vomiting.  . potassium chloride (K-DUR,KLOR-CON) 10 MEQ tablet TAKE 1 TABLET (10 MEQ TOTAL) BY  MOUTH DAILY.  [name] quinapril (ACCUPRIL) 20 MG tablet TAKE 1 TABLET BY MOUTH AT BEDTIME  . vitamin E (VITAMIN E) 400 UNIT capsule Take 400 Units by mouth daily.     No facility-administered encounter medications on file as of 09/24/2016.     Activities of Daily Living In your present state of health, do you have any difficulty performing the following activities: 09/24/2016  Hearing? N  Vision? N  Difficulty concentrating or making decisions? N  Walking or climbing stairs? Y  Dressing or bathing? N  Doing errands, shopping? N  Preparing Food and eating ? N  Using the Toilet? N  In the past six months, have you accidently leaked urine? N  Do you have problems with loss of bowel control? N  Managing your Medications? N  Managing your Finances? N  Housekeeping or managing your Housekeeping? N  Some recent data might be hidden    Patient Care Team: Sandford Craze, NP as PCP - General Juanda Chance Hedwig Morton, MD (Inactive) as Consulting Physician (Gastroenterology) Sheran Luz, MD as Consulting Physician (Physical Medicine and Rehabilitation)    Assessment:    Physical assessment deferred to PCP.  Exercise Activities and Dietary recommendations Current Exercise Habits: The patient does not participate in regular exercise at present, Exercise limited by: orthopedic condition(s)   Diet (meal preparation, eat out, water intake, caffeinated beverages, dairy products, fruits and vegetables): in general, an "unhealthy" diet, on average, 7 fast food meals per week     Goals    . Weight (lb) < 150 lb (68 kg)      Fall Risk Fall Risk  09/24/2016 09/20/2015 12/14/2014 02/20/2014 11/17/2013  Falls in the past year? No No No No No   Depression Screen PHQ 2/9 Scores 09/24/2016 09/20/2015 12/14/2014 02/20/2014  PHQ - 2 Score 0 0 0 0     Cognitive Function MMSE - Mini Mental State Exam 09/24/2016  Orientation to time 5  Orientation to Place 5  Registration 3  Attention/ Calculation 5  Recall 2    Language- name 2 objects 2  Language- repeat 1  Language- follow 3 step command 3  Language- read & follow direction 1  Write a sentence 1  Copy design 1  Total score 29        Immunization History  Administered Date(s) Administered  . Influenza Split 04/18/2011, 02/08/2012  . Influenza Whole 04/01/2007, 02/02/2008, 01/31/2009, 02/20/2010  . Influenza,inj,Quad PF,36+ Mos 02/11/2014, 01/28/2015, 03/27/2016  . Influenza-Unspecified 03/14/2013  . Pneumococcal Conjugate-13 03/23/2015  . Pneumococcal Polysaccharide-23 07/24/2006  . Td 03/19/2002, 05/14/2006  Screening Tests Health Maintenance  Topic Date Due  . TETANUS/TDAP  05/14/2016  . INFLUENZA VACCINE  12/12/2016  . COLONOSCOPY  03/20/2017  . DEXA SCAN  Completed  . PNA vac Low Risk Adult  Completed      Plan:   Follow up with PCP today as scheduled.  Eat heart healthy diet (full of fruits, vegetables, whole grains, lean protein, water--limit salt, fat, and sugar intake) and increase physical activity as tolerated.  Continue doing brain stimulating activities (puzzles, reading, adult coloring books, staying active) to keep memory sharp.   Bring a copy of your advance directives to your next office visit.    I have personally reviewed and noted the following in the patient's chart:   . Medical and social history . Use of alcohol, tobacco or illicit drugs  . Current medications and supplements . Functional ability and status . Nutritional status . Physical activity . Advanced directives . List of other physicians . Hospitalizations, surgeries, and ER visits in previous 12 months . Vitals . Screenings to include cognitive, depression, and falls . Referrals and appointments  In addition, I have reviewed and discussed with patient certain preventive protocols, quality metrics, and best practice recommendations. A written personalized care plan for preventive services as well as general preventive health  recommendations were provided to patient.     [place], [place]  09/24/2016

## 2016-09-24 ENCOUNTER — Ambulatory Visit (INDEPENDENT_AMBULATORY_CARE_PROVIDER_SITE_OTHER): Payer: PPO | Admitting: Family

## 2016-09-24 ENCOUNTER — Encounter: Payer: Self-pay | Admitting: Family

## 2016-09-24 VITALS — BP 158/88 | HR 90 | Ht 62.0 in | Wt 172.0 lb

## 2016-09-24 DIAGNOSIS — Z0000 Encounter for general adult medical examination without abnormal findings: Secondary | ICD-10-CM

## 2016-09-24 DIAGNOSIS — M810 Age-related osteoporosis without current pathological fracture: Secondary | ICD-10-CM | POA: Diagnosis not present

## 2016-09-24 DIAGNOSIS — K219 Gastro-esophageal reflux disease without esophagitis: Secondary | ICD-10-CM | POA: Diagnosis not present

## 2016-09-24 DIAGNOSIS — E7800 Pure hypercholesterolemia, unspecified: Secondary | ICD-10-CM | POA: Diagnosis not present

## 2016-09-24 DIAGNOSIS — I10 Essential (primary) hypertension: Secondary | ICD-10-CM | POA: Diagnosis not present

## 2016-09-24 DIAGNOSIS — R739 Hyperglycemia, unspecified: Secondary | ICD-10-CM | POA: Diagnosis not present

## 2016-09-24 LAB — BASIC METABOLIC PANEL
BUN: 12 mg/dL (ref 6–23)
CHLORIDE: 104 meq/L (ref 96–112)
CO2: 27 mEq/L (ref 19–32)
CREATININE: 0.63 mg/dL (ref 0.40–1.20)
Calcium: 9.7 mg/dL (ref 8.4–10.5)
GFR: 97.79 mL/min (ref >60.00)
GLUCOSE: 108 mg/dL — AB (ref 70–99)
POTASSIUM: 4.7 meq/L (ref 3.5–5.1)
Sodium: 139 mEq/L (ref 135–145)

## 2016-09-24 LAB — HEMOGLOBIN A1C: Hgb A1c MFr Bld: 5.6 % (ref 4.6–6.5)

## 2016-09-24 MED ORDER — QUINAPRIL HCL 40 MG PO TABS: 40.0000 mg | ORAL_TABLET | Freq: Every day | ORAL | 5 refills | Status: DC

## 2016-09-24 NOTE — Progress Notes (Signed)
Subjective:    Patient ID: [name], female    DOB: 27-Oct-1940, 76 y.o.   MRN: [id number]  HPI  Ms.  [name] is a 76 yr old female who presents today for follow up.  1) HTN- maintained on accupril and amlodipine. 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  She reports back pain which is being evaluated by Dr. Jillyn HiddenBean (ortho).    BP Readings from Last 3 Encounters:  09/24/16 (!) 156/82  03/27/16 (!) 118/54  11/21/15 (!) 106/54   2) Hyperlipidemia- declines lipid panel today.  Intolerant to statins.  Lab Results  Component Value Date   CHOL 314 (H) 03/27/2016   HDL 71.20 03/27/2016   LDLCALC 212 (H) 03/27/2016   LDLDIRECT 158.8 06/10/2008   TRIG 152.0 (H) 03/27/2016   CHOLHDL 4 03/27/2016   3) GERD- continues omeprazole and reports well controlled.   4) osteoporosis-continues caltrate.  Had hx of of femur fracture while on fosamax, declines further dexa scans or further meds for osteoporosis, continue calcium.   5) Hyperglycemia-  Lab Results  Component Value Date   HGBA1C 5.4 03/27/2016     Review of Systems See HPI  Past Medical History:  Diagnosis Date  . Arthritis    osteoarthritis  . Barrett's esophagus   . Cataract    bilateral cateracts removed  . Chronic kidney disease    kidney stones  . Chronic obstructive bronchitis (HCC)    Dr Delford FieldWright  . Colitis   . Colon polyps   . GERD (gastroesophageal reflux disease)   . History of chicken pox   . History of nephrolithiasis   . History of transfusion of packed red blood cells   . Hyperlipidemia   . Hypertension   . IFG (impaired fasting glucose)   . Osteoporosis    pt cannot tolerate fosamax  . 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  [name]. Urinary incontinence      Social History   Social History  . Marital status: Widowed    Spouse name: N/A  . Number of children: N/A  . Years of education: N/A   Occupational  History  . Retired    Social History Main Topics  . Smoking status: Former Smoker    Years: 15.00    Quit date: 05/15/1967  . Smokeless tobacco: Never Used  . Alcohol use No  . Drug use: No  . Sexual activity: No   Other Topics Concern  . Not on file   Social History Narrative   Regular exercise: yes    Past Surgical History:  Procedure Laterality Date  . ABDOMINAL HYSTERECTOMY  1970  . APPENDECTOMY    . CHOLECYSTECTOMY    . LEG SURGERY  2008   right  . TOTAL KNEE ARTHROPLASTY  04/2006   total left knee replacement    Family History  Problem Relation Age of Onset  . Coronary artery disease Mother   . Arthritis Mother   . Tuberculosis Unknown   . Asthma Maternal Grandmother   . Colon cancer Neg Hx   . Esophageal cancer Neg Hx   . Rectal cancer Neg Hx   . Stomach cancer Neg Hx     Allergies  Allergen Reactions  . Atorvastatin Other (See Comments)    REACTION: MUSCLE PAINS  . Diclofenac-Misoprostol Diarrhea and Nausea And Vomiting  . Fenofibrate Other (See Comments)    REACTION:  carpopedal spasms  . Rosuvastatin Other (See Comments)    REACTION: MUSCLE PAINS  . Statins Other (See Comments)    myalgias  . Amoxicillin-Pot Clavulanate Other (See Comments)    REACTION: unspecified  . Penicillins Hives  . Sulfonamide Derivatives Hives  . Tuberculin Ppd Other (See Comments)    Redness at sight    Current Outpatient Prescriptions on File Prior to Visit  Medication Sig Dispense Refill  . amLODipine (NORVASC) 5 MG tablet TAKE 1 TABLET (5 MG TOTAL) BY MOUTH DAILY. 90 tablet 0  . Ascorbic Acid (VITAMIN C) 1000 MG tablet Take 1,000 mg by mouth daily.    [name] aspirin 325 MG tablet Take 325 mg by mouth daily.      . beta carotene 15 MG capsule Take 15 mg by mouth daily.      . bifidobacterium infantis (ALIGN) capsule Take 1 capsule by mouth daily.      . Calcium Carbonate-Vitamin D (CALTRATE 600+D) 600-400 MG-UNIT per tablet Take 1 tablet by mouth 2 (two) times daily.      . carboxymethylcellulose (REFRESH PLUS) 0.5 % SOLN Place 1 drop into both eyes 2 (two) times daily.    . Coenzyme Q10 200 MG capsule Take 200 mg by mouth daily.      [name] ibuprofen (ADVIL,MOTRIN) 200 MG tablet Take 800 mg by mouth daily.    . Multiple Vitamin (MULTIVITAMIN) tablet Take 1 tablet by mouth daily.      . niacin 500 MG tablet Take 1,500 mg by mouth at bedtime.    . Omega-3 Fatty Acids (FISH OIL) 1000 MG CAPS Take 1 capsule by mouth every morning.     [name] omeprazole (PRILOSEC) 20 MG capsule TAKE 1 CAPSULE BY MOUTH DAILY, THEN 1 CAPSULE AS NEEDED 180 capsule 1  . ondansetron (ZOFRAN ODT) 8 MG disintegrating tablet Take 1 tablet (8 mg total) by mouth every 8 (eight) hours as needed for nausea or vomiting. 9 tablet 0  . potassium chloride (K-DUR,KLOR-CON) 10 MEQ tablet TAKE 1 TABLET (10 MEQ TOTAL) BY MOUTH DAILY. 90 tablet 1  . quinapril (ACCUPRIL) 20 MG tablet TAKE 1 TABLET BY MOUTH AT BEDTIME 90 tablet 0  . vitamin E (VITAMIN E) 400 UNIT capsule Take 400 Units by mouth daily.       No current facility-administered medications on file prior to visit.     BP (!) 156/82 (BP Location: Right Arm, Patient Position: Sitting, Cuff Size: Normal)   Pulse 90   Ht 5\' 2"  (1.575 m)   Wt 172 lb (78 kg)   LMP 05/14/1968   SpO2 98%   BMI 31.46 kg/m       Objective:   Physical Exam  Constitutional: She is oriented to person, place, and time. She appears well-developed and well-nourished.  HENT:  Head: Normocephalic and atraumatic.  Cardiovascular: Normal rate, regular rhythm and normal heart sounds.   No murmur heard. Pulmonary/Chest: Effort normal and breath sounds normal. No respiratory distress. She has no wheezes.  Musculoskeletal: She exhibits no edema.  Neurological: She is alert and oriented to person, place, and time.  Psychiatric: She has a normal mood and affect. Her behavior is normal. Judgment and thought content normal.          Assessment & Plan:

## 2016-09-24 NOTE — Patient Instructions (Addendum)
Increase accupril from 48m to 439m  Complete blood work prior to leaving.  Work on heMirantexercise and weight loss.    [name] Thank you for taking time to come for your Medicare Wellness Visit. I appreciate your ongoing commitment to your health goals. Please review the following plan we discussed and let me know if I can assist you in the future.   These are the goals we discussed: Goals    . Weight (lb) < 150 lb (68 kg)       This is a list of the screening recommended for you and due dates:  Health Maintenance  Topic Date Due  . Tetanus Vaccine  05/14/2016  . Flu Shot  12/12/2016  . Colon Cancer Screening  03/20/2017  . DEXA scan (bone density measurement)  Completed  . Pneumonia vaccines  Completed   Eat heart healthy diet (full of fruits, vegetables, whole grains, lean protein, water--limit salt, fat, and sugar intake) and increase physical activity as tolerated.  Continue doing brain stimulating activities (puzzles, reading, adult coloring books, staying active) to keep memory sharp.   Bring a copy of your advance directives to your next office visit. Health Maintenance, Female Adopting a healthy lifestyle and getting preventive care can go a long way to promote health and wellness. Talk with your health care provider about what schedule of regular examinations is right for you. This is a good chance for you to check in with your provider about disease prevention and staying healthy. In between checkups, there are plenty of things you can do on your own. Experts have done a lot of research about which lifestyle changes and preventive measures are most likely to keep you healthy. Ask your health care provider for more information. Weight and diet Eat a healthy diet  Be sure to include plenty of vegetables, fruits, low-fat dairy products, and lean protein.  Do not eat a lot of foods high in solid fats, added sugars, or salt.  Get regular exercise. This is one  of the most important things you can do for your health.  Most adults should exercise for at least 150 minutes each week. The exercise should increase your heart rate and make you sweat (moderate-intensity exercise).  Most adults should also do strengthening exercises at least twice a week. This is in addition to the moderate-intensity exercise. Maintain a healthy weight  Body mass index (BMI) is a measurement that can be used to identify possible weight problems. It estimates body fat based on height and weight. Your health care provider can help determine your BMI and help you achieve or maintain a healthy weight.  For females 203ears of age and older:  A BMI below 18.5 is considered underweight.  A BMI of 18.5 to 24.9 is normal.  A BMI of 25 to 29.9 is considered overweight.  A BMI of 30 and above is considered obese. Watch levels of cholesterol and blood lipids  You should start having your blood tested for lipids and cholesterol at 2046ears of age, then have this test every 5 years.  You may need to have your cholesterol levels checked more often if:  Your lipid or cholesterol levels are high.  You are older than 8909ears of age.  You are at high risk for heart disease. Cancer screening Lung Cancer  Lung cancer screening is recommended for adults [id number] old who are at high risk for lung cancer because of a history of smoking.  A yearly low-dose CT scan of the lungs is recommended for people who:  Currently smoke.  Have quit within the past 15 years.  Have at least a 30-pack-year history of smoking. A pack year is smoking an average of one pack of cigarettes a day for 1 year.  Yearly screening should continue until it has been 15 years since you quit.  Yearly screening should stop if you develop a health problem that would prevent you from having lung cancer treatment. Breast Cancer  Practice breast self-awareness. This means understanding how your breasts  normally appear and feel.  It also means doing regular breast self-exams. Let your health care provider know about any changes, no matter how small.  If you are in your 20s or 30s, you should have a clinical breast exam (CBE) by a health care provider every 1-3 years as part of a regular health exam.  If you are 101 or older, have a CBE every year. Also consider having a breast X-ray (mammogram) every year.  If you have a family history of breast cancer, talk to your health care provider about genetic screening.  If you are at high risk for breast cancer, talk to your health care provider about having an MRI and a mammogram every year.  Breast cancer gene (BRCA) assessment is recommended for women who have family members with BRCA-related cancers. BRCA-related cancers include:  Breast.  Ovarian.  Tubal.  Peritoneal cancers.  Results of the assessment will determine the need for genetic counseling and BRCA1 and BRCA2 testing. Cervical Cancer  Your health care provider may recommend that you be screened regularly for cancer of the pelvic organs (ovaries, uterus, and vagina). This screening involves a pelvic examination, including checking for microscopic changes to the surface of your cervix (Pap test). You may be encouraged to have this screening done every 3 years, beginning at age 10.  For women ages 76-65, health care providers may recommend pelvic exams and Pap testing every 3 years, or they may recommend the Pap and pelvic exam, combined with testing for human papilloma virus (HPV), every 5 years. Some types of HPV increase your risk of cervical cancer. Testing for HPV may also be done on women of any age with unclear Pap test results.  Other health care providers may not recommend any screening for nonpregnant women who are considered low risk for pelvic cancer and who do not have symptoms. Ask your health care provider if a screening pelvic exam is right for you.  If you have had  past treatment for cervical cancer or a condition that could lead to cancer, you need Pap tests and screening for cancer for at least 20 years after your treatment. If Pap tests have been discontinued, your risk factors (such as having a new sexual partner) need to be reassessed to determine if screening should resume. Some women have medical problems that increase the chance of getting cervical cancer. In these cases, your health care provider may recommend more frequent screening and Pap tests. Colorectal Cancer  This type of cancer can be detected and often prevented.  Routine colorectal cancer screening usually begins at 76 years of age and continues through 76 years of age.  Your health care provider may recommend screening at an earlier age if you have risk factors for colon cancer.  Your health care provider may also recommend using home test kits to check for hidden blood in the stool.  A small camera at the end of a  tube can be used to examine your colon directly (sigmoidoscopy or colonoscopy). This is done to check for the earliest forms of colorectal cancer.  Routine screening usually begins at age 51.  Direct examination of the colon should be repeated every 5-10 years through 76 years of age. However, you may need to be screened more often if early forms of precancerous polyps or small growths are found. Skin Cancer  Check your skin from head to toe regularly.  Tell your health care provider about any new moles or changes in moles, especially if there is a change in a mole's shape or color.  Also tell your health care provider if you have a mole that is larger than the size of a pencil eraser.  Always use sunscreen. Apply sunscreen liberally and repeatedly throughout the day.  Protect yourself by wearing long sleeves, pants, a wide-brimmed hat, and sunglasses whenever you are outside. Heart disease, diabetes, and high blood pressure  High blood pressure causes heart disease  and increases the risk of stroke. High blood pressure is more likely to develop in:  People who have blood pressure in the high end of the normal range (130-139/85-89 mm Hg).  People who are overweight or obese.  People who are African American.  If you are 59-41 years of age, have your blood pressure checked every 3-5 years. If you are 107 years of age or older, have your blood pressure checked every year. You should have your blood pressure measured twice-once when you are at a hospital or clinic, and once when you are not at a hospital or clinic. Record the average of the two measurements. To check your blood pressure when you are not at a hospital or clinic, you can use:  An automated blood pressure machine at a pharmacy.  A home blood pressure monitor.  If you are between 47 years and 66 years old, ask your health care provider if you should take aspirin to prevent strokes.  Have regular diabetes screenings. This involves taking a blood sample to check your fasting blood sugar level.  If you are at a normal weight and have a low risk for diabetes, have this test once every three years after 76 years of age.  If you are overweight and have a high risk for diabetes, consider being tested at a younger age or more often. Preventing infection Hepatitis B  If you have a higher risk for hepatitis B, you should be screened for this virus. You are considered at high risk for hepatitis B if:  You were born in a country where hepatitis B is common. Ask your health care provider which countries are considered high risk.  Your parents were born in a high-risk country, and you have not been immunized against hepatitis B (hepatitis B vaccine).  You have HIV or AIDS.  You use needles to inject street drugs.  You live with someone who has hepatitis B.  You have had sex with someone who has hepatitis B.  You get hemodialysis treatment.  You take certain medicines for conditions, including  cancer, organ transplantation, and autoimmune conditions. Hepatitis C  Blood testing is recommended for:  Everyone born from [birth year] through 1965.  Anyone with known risk factors for hepatitis C. Sexually transmitted infections (STIs)  You should be screened for sexually transmitted infections (STIs) including gonorrhea and chlamydia if:  You are sexually active and are younger than 76 years of age.  You are older than 76 years of age  and your health care provider tells you that you are at risk for this type of infection.  Your sexual activity has changed since you were last screened and you are at an increased risk for chlamydia or gonorrhea. Ask your health care provider if you are at risk.  If you do not have HIV, but are at risk, it may be recommended that you take a prescription medicine daily to prevent HIV infection. This is called pre-exposure prophylaxis (PrEP). You are considered at risk if:  You are sexually active and do not regularly use condoms or know the HIV status of your partner(s).  You take drugs by injection.  You are sexually active with a partner who has HIV. Talk with your health care provider about whether you are at high risk of being infected with HIV. If you choose to begin PrEP, you should first be tested for HIV. You should then be tested every 3 months for as long as you are taking PrEP. Pregnancy  If you are premenopausal and you may become pregnant, ask your health care provider about preconception counseling.  If you may become pregnant, take 400 to 800 micrograms (mcg) of folic acid every day.  If you want to prevent pregnancy, talk to your health care provider about birth control (contraception). Osteoporosis and menopause  Osteoporosis is a disease in which the bones lose minerals and strength with aging. This can result in serious bone fractures. Your risk for osteoporosis can be identified using a bone density scan.  If you are 23 years of age  or older, or if you are at risk for osteoporosis and fractures, ask your health care provider if you should be screened.  Ask your health care provider whether you should take a calcium or vitamin D supplement to lower your risk for osteoporosis.  Menopause may have certain physical symptoms and risks.  Hormone replacement therapy may reduce some of these symptoms and risks. Talk to your health care provider about whether hormone replacement therapy is right for you. Follow these instructions at home:  Schedule regular health, dental, and eye exams.  Stay current with your immunizations.  Do not use any tobacco products including cigarettes, chewing tobacco, or electronic cigarettes.  If you are pregnant, do not drink alcohol.  If you are breastfeeding, limit how much and how often you drink alcohol.  Limit alcohol intake to no more than 1 drink per day for nonpregnant women. One drink equals 12 ounces of beer, 5 ounces of wine, or 1 ounces of hard liquor.  Do not use street drugs.  Do not share needles.  Ask your health care provider for help if you need support or information about quitting drugs.  Tell your health care provider if you often feel depressed.  Tell your health care provider if you have ever been abused or do not feel safe at home. This information is not intended to replace advice given to you by your health care provider. Make sure you discuss any questions you have with your health care provider. Document Released: 11/13/2010 Document Revised: 10/06/2015 Document Reviewed: 02/01/2015 Elsevier Interactive Patient Education  2017 [name] stands for "Dietary Approaches to Stop Hypertension." The [name] eating plan is a healthy eating plan that has been shown to reduce high blood pressure (hypertension). It may also reduce your risk for type 2 diabetes, heart disease, and stroke. The [name] eating plan may also help with weight  loss. What are  tips for following this plan? General guidelines   Avoid eating more than 2,300 mg (milligrams) of salt (sodium) a day. If you have hypertension, you may need to reduce your sodium intake to 1,500 mg a day.  Limit alcohol intake to no more than 1 drink a day for nonpregnant women and 2 drinks a day for men. One drink equals 12 oz of beer, 5 oz of wine, or 1 oz of hard liquor.  Work with your health care provider to maintain a healthy body weight or to lose weight. Ask what an ideal weight is for you.  Get at least 30 minutes of exercise that causes your heart to beat faster (aerobic exercise) most days of the week. Activities may include walking, swimming, or biking.  Work with your health care provider or diet and nutrition specialist (dietitian) to adjust your eating plan to your individual calorie needs. Reading food labels   Check food labels for the amount of sodium per serving. Choose foods with less than 5 percent of the Daily Value of sodium. Generally, foods with less than 300 mg of sodium per serving fit into this eating plan.  To find whole grains, look for the word "whole" as the first word in the ingredient list. Shopping   Buy products labeled as "low-sodium" or "no salt added."  Buy fresh foods. Avoid canned foods and premade or frozen meals. Cooking   Avoid adding salt when cooking. Use salt-free seasonings or herbs instead of table salt or sea salt. Check with your health care provider or pharmacist before using salt substitutes.  Do not fry foods. Cook foods using healthy methods such as baking, boiling, grilling, and broiling instead.  Cook with heart-healthy oils, such as olive, canola, soybean, or sunflower oil. Meal planning    Eat a balanced diet that includes:  5 or more servings of fruits and vegetables each day. At each meal, try to fill half of your plate with fruits and vegetables.  Up to 6-8 servings of whole grains each day.  Less  than 6 oz of lean meat, poultry, or fish each day. A 3-oz serving of meat is about the same size as a deck of cards. One egg equals 1 oz.  2 servings of low-fat dairy each day.  A serving of nuts, seeds, or beans 5 times each week.  Heart-healthy fats. Healthy fats called Omega-3 fatty acids are found in foods such as flaxseeds and coldwater fish, like sardines, salmon, and mackerel.  Limit how much you eat of the following:  Canned or prepackaged foods.  Food that is high in trans fat, such as fried foods.  Food that is high in saturated fat, such as fatty meat.  Sweets, desserts, sugary drinks, and other foods with added sugar.  Full-fat dairy products.  Do not salt foods before eating.  Try to eat at least 2 vegetarian meals each week.  Eat more home-cooked food and less restaurant, buffet, and fast food.  When eating at a restaurant, ask that your food be prepared with less salt or no salt, if possible. What foods are recommended? The items listed may not be a complete list. Talk with your dietitian about what dietary choices are best for you. Grains  Whole-grain or whole-wheat bread. Whole-grain or whole-wheat pasta. Brown rice. [place]. Bulgur. Whole-grain and low-sodium cereals. Pita bread. Low-fat, low-sodium crackers. Whole-wheat flour tortillas. Vegetables  Fresh or frozen vegetables (raw, steamed, roasted, or grilled). Low-sodium or reduced-sodium tomato and vegetable juice.  Low-sodium or reduced-sodium tomato sauce and tomato paste. Low-sodium or reduced-sodium canned vegetables. Fruits  All fresh, dried, or frozen fruit. Canned fruit in natural juice (without added sugar). Meat and other protein foods  Skinless chicken or [place]. Ground chicken or [place]. Pork with fat trimmed off. Fish and seafood. Egg whites. Dried beans, peas, or lentils. Unsalted nuts, nut butters, and seeds. Unsalted canned beans. Lean cuts of beef with fat trimmed off. Low-sodium, lean  deli meat. Dairy  Low-fat (1%) or fat-free (skim) milk. Fat-free, low-fat, or reduced-fat cheeses. Nonfat, low-sodium ricotta or cottage cheese. Low-fat or nonfat yogurt. Low-fat, low-sodium cheese. Fats and oils  Soft margarine without trans fats. Vegetable oil. Low-fat, reduced-fat, or light mayonnaise and salad dressings (reduced-sodium). Canola, safflower, olive, soybean, and sunflower oils. Avocado. Seasoning and other foods  Herbs. Spices. Seasoning mixes without salt. Unsalted popcorn and pretzels. Fat-free sweets. What foods are not recommended? The items listed may not be a complete list. Talk with your dietitian about what dietary choices are best for you. Grains  Baked goods made with fat, such as croissants, muffins, or some breads. Dry pasta or rice meal packs. Vegetables  Creamed or fried vegetables. Vegetables in a cheese sauce. Regular canned vegetables (not low-sodium or reduced-sodium). Regular canned tomato sauce and paste (not low-sodium or reduced-sodium). Regular tomato and vegetable juice (not low-sodium or reduced-sodium). [name]. Olives. Fruits  Canned fruit in a light or heavy syrup. Fried fruit. Fruit in cream or butter sauce. Meat and other protein foods  Fatty cuts of meat. Ribs. Fried meat. [name]. Sausage. Bologna and other processed lunch meats. Salami. Fatback. Hotdogs. Bratwurst. Salted nuts and seeds. Canned beans with added salt. Canned or smoked fish. Whole eggs or egg yolks. Chicken or [place] with skin. Dairy  Whole or 2% milk, cream, and half-and-half. Whole or full-fat cream cheese. Whole-fat or sweetened yogurt. Full-fat cheese. Nondairy creamers. Whipped toppings. Processed cheese and cheese spreads. Fats and oils  Butter. Stick margarine. Lard. Shortening. Ghee. Bacon fat. Tropical oils, such as coconut, palm kernel, or palm oil. Seasoning and other foods  Salted popcorn and pretzels. Onion salt, garlic salt, seasoned salt, table salt, and sea salt.  Worcestershire sauce. Tartar sauce. Barbecue sauce. Teriyaki sauce. Soy sauce, including reduced-sodium. Steak sauce. Canned and packaged gravies. Fish sauce. Oyster sauce. Cocktail sauce. Horseradish that you find on the shelf. Ketchup. Mustard. Meat flavorings and tenderizers. Bouillon cubes. Hot sauce and Tabasco sauce. Premade or packaged marinades. Premade or packaged taco seasonings. Relishes. Regular salad dressings. Where to find more information:  National Heart, Lung, and [place]: https://wilson-eaton.com/  American Heart Association: www.heart.org Summary  The [name] eating plan is a healthy eating plan that has been shown to reduce high blood pressure (hypertension). It may also reduce your risk for type 2 diabetes, heart disease, and stroke.  With the [name] eating plan, you should limit salt (sodium) intake to 2,300 mg a day. If you have hypertension, you may need to reduce your sodium intake to 1,500 mg a day.  When on the [name] eating plan, aim to eat more fresh fruits and vegetables, whole grains, lean proteins, low-fat dairy, and heart-healthy fats.  Work with your health care provider or diet and nutrition specialist (dietitian) to adjust your eating plan to your individual calorie needs. This information is not intended to replace advice given to you by your health care provider. Make sure you discuss any questions you have with your health care provider. Document Released: 04/19/2011 Document Revised: 04/23/2016  Document Reviewed: 04/23/2016 Elsevier Interactive Patient Education  2017 [employer].

## 2016-09-25 NOTE — Assessment & Plan Note (Signed)
Mild elevation of glucose. Continue healthy diet, exercise, weight loss efforts.

## 2016-09-25 NOTE — Progress Notes (Signed)
Noted and agree. 

## 2016-09-25 NOTE — Assessment & Plan Note (Signed)
Continue caltrate.  

## 2016-09-25 NOTE — Assessment & Plan Note (Signed)
Uncontrolled. She declines lipid panel today. Wants to work on diet first.  She is intolerant to statins unfortunately. Reinforced importance of low fat/low cholesterol diet/exercise/weight loss.

## 2016-09-25 NOTE — Assessment & Plan Note (Signed)
Blood pressure is elevated today.  Will increase accupril from 20mg  to 40mg .

## 2016-09-25 NOTE — Assessment & Plan Note (Signed)
Stable on PPI, continue same.  

## 2016-09-25 NOTE — Assessment & Plan Note (Signed)
>>  ASSESSMENT AND PLAN FOR HYPERCHOLESTEROLEMIA WRITTEN ON 09/25/2016  8:08 AM BY O'SULLIVAN, Miranda Bober, NP  Uncontrolled. She declines lipid panel today. Wants to work on diet first.  She is intolerant to statins unfortunately. Reinforced importance of low fat/low cholesterol diet/exercise/weight loss.

## 2016-09-25 NOTE — Assessment & Plan Note (Signed)
>>  ASSESSMENT AND PLAN FOR HYPERGLYCEMIA WRITTEN ON 09/25/2016  8:12 AM BY O'SULLIVAN, Carlye Panameno, NP  Mild elevation of glucose. Continue healthy diet, exercise, weight loss efforts.

## 2016-09-25 NOTE — Assessment & Plan Note (Signed)
>>  ASSESSMENT AND PLAN FOR ESSENTIAL HYPERTENSION WRITTEN ON 09/25/2016  8:07 AM BY O'SULLIVAN, Francely Craw, NP  Blood pressure is elevated today.  Will increase accupril from 20mg  to 40mg .

## 2016-09-27 DIAGNOSIS — M48062 Spinal stenosis, lumbar region with neurogenic claudication: Secondary | ICD-10-CM | POA: Diagnosis not present

## 2016-09-27 DIAGNOSIS — M5136 Other intervertebral disc degeneration, lumbar region: Secondary | ICD-10-CM | POA: Diagnosis not present

## 2016-10-09 ENCOUNTER — Ambulatory Visit (INDEPENDENT_AMBULATORY_CARE_PROVIDER_SITE_OTHER): Payer: PPO | Admitting: Family

## 2016-10-09 ENCOUNTER — Encounter (HOSPITAL_BASED_OUTPATIENT_CLINIC_OR_DEPARTMENT_OTHER): Payer: Self-pay

## 2016-10-09 ENCOUNTER — Ambulatory Visit (HOSPITAL_BASED_OUTPATIENT_CLINIC_OR_DEPARTMENT_OTHER)
Admission: RE | Admit: 2016-10-09 | Discharge: 2016-10-09 | Disposition: A | Discharge status: Home / Self Care | Payer: PPO | Source: Ambulatory Visit | Attending: Family | Admitting: Family

## 2016-10-09 VITALS — BP 135/67 | HR 69

## 2016-10-09 DIAGNOSIS — I10 Essential (primary) hypertension: Secondary | ICD-10-CM

## 2016-10-09 DIAGNOSIS — Z1231 Encounter for screening mammogram for malignant neoplasm of breast: Secondary | ICD-10-CM | POA: Diagnosis not present

## 2016-10-09 DIAGNOSIS — Z0000 Encounter for general adult medical examination without abnormal findings: Secondary | ICD-10-CM

## 2016-10-09 LAB — BASIC METABOLIC PANEL
BUN: 10 mg/dL (ref 6–23)
CALCIUM: 9.2 mg/dL (ref 8.4–10.5)
CO2: 24 meq/L (ref 19–32)
CREATININE: 0.6 mg/dL (ref 0.40–1.20)
Chloride: 101 mEq/L (ref 96–112)
GFR: 103.45 mL/min (ref >60.00)
GLUCOSE: 84 mg/dL (ref 70–99)
Potassium: 3.8 mEq/L (ref 3.5–5.1)
Sodium: 134 mEq/L — ABNORMAL LOW (ref 135–145)

## 2016-10-09 NOTE — Progress Notes (Signed)
Noted and agree. 

## 2016-10-09 NOTE — Progress Notes (Signed)
Patient in for BP check per order from M. O'sullivan dated 09/24/16.  Patient states she has taken BP medication last pm and this am.  BP last OV = 156/82 P =90.  Today's BP =135/67 P= 69  Per M. O'Sullivan patient to have BMET today and return for OV in 3 months.  Appointment scheduled for patient.

## 2016-10-17 DIAGNOSIS — H35371 Puckering of macula, right eye: Secondary | ICD-10-CM | POA: Diagnosis not present

## 2016-10-17 DIAGNOSIS — H1859 Other hereditary corneal dystrophies: Secondary | ICD-10-CM | POA: Diagnosis not present

## 2016-11-15 MED FILL — POTASSIUM CL 10 MEQ TAB SA: 10 | 90 days supply | Qty: 90 | Fill #1

## 2016-12-06 ENCOUNTER — Other Ambulatory Visit: Payer: Self-pay | Admitting: Family

## 2016-12-06 MED FILL — OMEPRAZOLE DR 20 MG CAPSULE: 20 | 90 days supply | Qty: 180 | Fill #0

## 2016-12-12 ENCOUNTER — Encounter: Payer: Self-pay | Admitting: Podiatry

## 2016-12-12 ENCOUNTER — Ambulatory Visit (INDEPENDENT_AMBULATORY_CARE_PROVIDER_SITE_OTHER): Payer: PPO | Admitting: Podiatry

## 2016-12-12 VITALS — BP 118/64

## 2016-12-12 DIAGNOSIS — L600 Ingrowing nail: Secondary | ICD-10-CM | POA: Diagnosis not present

## 2016-12-12 NOTE — Progress Notes (Signed)
Subjective:    Patient ID: [name], female   DOB: 76 y.o.   MRN: [id number]   HPI patient presents stating that the big toenails of both feet are very thick incurvated and they are moderately painful when pressed. 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 Due to long-standing nature damage to the plates and abnormal growth I recommended permanent procedure patient wants surgery understanding risk. I infiltrated each hallux 60 Milligan times like Marcaine mixture remove the hallux nail exposed matrix and applied phenol for applications 30 seconds followed by alcohol lavaged sterile dressing. Gave instructions on soaks and reappoint

## 2016-12-12 NOTE — Progress Notes (Signed)
   Subjective:    Patient ID: Miranda DickSylvia W Hartney, female    DOB: [birth year], 76 y.o.   MRN: [id number]  HPI Chief Complaint  Patient presents with  . Nail Problem    B/L great toenails have discoloration, thick, and painful for about 1 year.      Review of Systems  Musculoskeletal: Positive for joint swelling.  Skin: Positive for color change.  All other systems reviewed and are negative.      Objective:   Physical Exam        Assessment & Plan:

## 2016-12-12 NOTE — Patient Instructions (Signed)

## 2016-12-21 ENCOUNTER — Other Ambulatory Visit: Payer: Self-pay | Admitting: Family

## 2016-12-21 MED FILL — AMLODIPINE BESYLATE 5 MG TA: 5 | 90 days supply | Qty: 90 | Fill #0

## 2017-01-11 ENCOUNTER — Encounter: Payer: Self-pay | Admitting: Family

## 2017-01-11 ENCOUNTER — Ambulatory Visit (INDEPENDENT_AMBULATORY_CARE_PROVIDER_SITE_OTHER): Payer: PPO | Admitting: Family

## 2017-01-11 VITALS — BP 135/60 | HR 88 | Temp 98.3°F | Ht 62.0 in | Wt 171.0 lb

## 2017-01-11 DIAGNOSIS — M810 Age-related osteoporosis without current pathological fracture: Secondary | ICD-10-CM

## 2017-01-11 DIAGNOSIS — E1159 Type 2 diabetes mellitus with other circulatory complications: Secondary | ICD-10-CM

## 2017-01-11 DIAGNOSIS — K219 Gastro-esophageal reflux disease without esophagitis: Secondary | ICD-10-CM | POA: Diagnosis not present

## 2017-01-11 DIAGNOSIS — I152 Hypertension secondary to endocrine disorders: Secondary | ICD-10-CM

## 2017-01-11 DIAGNOSIS — I10 Essential (primary) hypertension: Secondary | ICD-10-CM

## 2017-01-11 LAB — BASIC METABOLIC PANEL
BUN: 12 mg/dL (ref 6–23)
CALCIUM: 9.1 mg/dL (ref 8.4–10.5)
CO2: 23 meq/L (ref 19–32)
CREATININE: 0.73 mg/dL (ref 0.40–1.20)
Chloride: 102 mEq/L (ref 96–112)
GFR: 82.44 mL/min (ref >60.00)
Glucose, Bld: 123 mg/dL — ABNORMAL HIGH (ref 70–99)
Potassium: 3.6 mEq/L (ref 3.5–5.1)
Sodium: 133 mEq/L — ABNORMAL LOW (ref 135–145)

## 2017-01-11 NOTE — Assessment & Plan Note (Signed)
Stable on PPI, continue same.  

## 2017-01-11 NOTE — Assessment & Plan Note (Signed)
Blood pressure is stable on current meds. Continue same.  

## 2017-01-11 NOTE — Assessment & Plan Note (Signed)
Continue caltrate. No further fosamax due to hx of femur fracture.

## 2017-01-11 NOTE — Patient Instructions (Signed)
Please complete lab work prior to leaving.   

## 2017-01-11 NOTE — Assessment & Plan Note (Signed)
>>  ASSESSMENT AND PLAN FOR ESSENTIAL HYPERTENSION WRITTEN ON 01/11/2017  1:54 PM BY O'SULLIVAN, Kiwan Gadsden, NP  Blood pressure is stable on current meds. Continue same.

## 2017-01-11 NOTE — Progress Notes (Signed)
Subjective:    Patient ID: Miranda DickSylvia W [name], female    DOB: 1941/02/24, 76 y.o.   MRN: [id number]  HPI  [name] is a 76 yr old female who presents today for follow up.  HTN- she is currently maintained on amlodipine 5mg , accupril 40mg .  BP Readings from Last 3 Encounters:  01/11/17 135/60  12/12/16 118/64  10/09/16 135/67   GERD- maintained on omeprazole 20mg . Takes one tab most days.  Occasionally will take 2.   Osteoporosis- on caltrate, previous hx of femur fracture on fosamax.  Review of Systems  Respiratory: Negative for shortness of breath.   Cardiovascular: Negative for chest pain and leg swelling.   Past Medical History:  Diagnosis Date  . Arthritis    osteoarthritis  . Barrett's esophagus   . Cataract    bilateral cateracts removed  . Chronic kidney disease    kidney stones  . Chronic obstructive bronchitis (HCC)    Dr Delford FieldWright  . Colitis   . Colon polyps   . GERD (gastroesophageal reflux disease)   . History of chicken pox   . History of nephrolithiasis   . History of transfusion of packed red blood cells   . Hyperlipidemia   . Hypertension   . IFG (impaired fasting glucose)   . Osteoporosis    pt cannot tolerate fosamax  . 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  [name]. Urinary incontinence      Social History   Social History  . Marital status: Widowed    Spouse name: N/A  . Number of children: N/A  . Years of education: N/A   Occupational History  . Retired    Social History Main Topics  . Smoking status: Former Smoker    Years: 15.00    Quit date: 05/15/1967  . Smokeless tobacco: Never Used  . Alcohol use No  . Drug use: No  . Sexual activity: No   Other Topics Concern  . Not on file   Social History Narrative   Regular exercise: yes    Past Surgical History:  Procedure Laterality Date  . ABDOMINAL HYSTERECTOMY  1970  . APPENDECTOMY    . CHOLECYSTECTOMY    . LEG SURGERY  2008   right  . TOTAL KNEE ARTHROPLASTY  04/2006   total left knee replacement    Family History  Problem Relation Age of Onset  . Coronary artery disease Mother   . Arthritis Mother   . Tuberculosis Unknown   . Asthma Maternal Grandmother   . Colon cancer Neg Hx   . Esophageal cancer Neg Hx   . Rectal cancer Neg Hx   . Stomach cancer Neg Hx     Allergies  Allergen Reactions  . Atorvastatin Other (See Comments)    REACTION: MUSCLE PAINS  . Diclofenac-Misoprostol Diarrhea and Nausea And Vomiting  . Fenofibrate Other (See Comments)    REACTION: carpopedal spasms  . Rosuvastatin Other (See Comments)    REACTION: MUSCLE PAINS  . Statins Other (See Comments)    myalgias  . Amoxicillin-Pot Clavulanate Other (See Comments)    REACTION: unspecified  . Penicillins Hives  . Sulfonamide Derivatives Hives  . Tuberculin Ppd Other (See Comments)    Redness at sight    Current Outpatient Prescriptions on File Prior to Visit  Medication Sig Dispense Refill  . amLODipine (NORVASC) 5 MG tablet TAKE 1 TABLET (5 MG TOTAL) BY MOUTH DAILY. 90 tablet 1  .  Ascorbic Acid (VITAMIN C) 1000 MG tablet Take 1,000 mg by mouth daily.    [name] aspirin 325 MG tablet Take 325 mg by mouth daily.      . beta carotene 15 MG capsule Take 15 mg by mouth daily.      . bifidobacterium infantis (ALIGN) capsule Take 1 capsule by mouth daily.      . Calcium Carbonate-Vitamin D (CALTRATE 600+D) 600-400 MG-UNIT per tablet Take 1 tablet by mouth 2 (two) times daily.    . carboxymethylcellulose (REFRESH PLUS) 0.5 % SOLN Place 1 drop into both eyes 2 (two) times daily.    . Coenzyme Q10 200 MG capsule Take 200 mg by mouth daily.      [name] ibuprofen (ADVIL,MOTRIN) 200 MG tablet Take 800 mg by mouth daily.    . Multiple Vitamin (MULTIVITAMIN) tablet Take 1 tablet by mouth daily.      . niacin 500 MG tablet Take 1,500 mg by mouth at bedtime.    . Omega-3 Fatty Acids (FISH OIL) 1000 MG CAPS Take 1 capsule by mouth every morning.       [name] omeprazole (PRILOSEC) 20 MG capsule TAKE 1 CAPSULE BY MOUTH DAILY, AND THEN 1 CAPSULE AS NEEDED 180 capsule 1  . ondansetron (ZOFRAN ODT) 8 MG disintegrating tablet Take 1 tablet (8 mg total) by mouth every 8 (eight) hours as needed for nausea or vomiting. 9 tablet 0  . potassium chloride (K-DUR,KLOR-CON) 10 MEQ tablet TAKE 1 TABLET (10 MEQ TOTAL) BY MOUTH DAILY. 90 tablet 1  . quinapril (ACCUPRIL) 40 MG tablet Take 1 tablet (40 mg total) by mouth at bedtime. 30 tablet 5  . vitamin E (VITAMIN E) 400 UNIT capsule Take 400 Units by mouth daily.       No current facility-administered medications on file prior to visit.     BP 135/60   Pulse 88   Temp 98.3 F (36.8 C) (Oral)   Ht 5\' 2"  (1.575 m)   Wt 171 lb (77.6 kg)   LMP 05/14/1968   SpO2 98%   BMI 31.28 kg/m       Objective:   Physical Exam  Constitutional: She is oriented to person, place, and time. She appears well-developed and well-nourished.  HENT:  Head: Normocephalic and atraumatic.  Cardiovascular: Normal rate, regular rhythm and normal heart sounds.   No murmur heard. Pulmonary/Chest: Effort normal and breath sounds normal. No respiratory distress. She has no wheezes.  Musculoskeletal: She exhibits no edema.  Neurological: She is alert and oriented to person, place, and time.  Psychiatric: She has a normal mood and affect. Her behavior is normal. Judgment and thought content normal.          Assessment & Plan:

## 2017-01-14 ENCOUNTER — Encounter: Payer: Self-pay | Admitting: Family

## 2017-01-21 MED FILL — QUINAPRIL HCL 40 MG TABS: 40 | 30 days supply | Qty: 30 | Fill #0

## 2017-02-01 ENCOUNTER — Ambulatory Visit: Payer: PPO | Admitting: Family

## 2017-02-11 ENCOUNTER — Other Ambulatory Visit: Payer: Self-pay | Admitting: Family

## 2017-02-11 NOTE — Telephone Encounter (Signed)
Rx approved and sent to the pharmacy by e-script.//AB/CMA 

## 2017-02-12 MED FILL — POTASSIUM CL ER 10 MEQ TAB: 10 | 90 days supply | Qty: 90 | Fill #0

## 2017-02-20 MED FILL — QUINAPRIL HCL 40 MG TABS: 40 | 30 days supply | Qty: 30 | Fill #1

## 2017-03-13 ENCOUNTER — Other Ambulatory Visit: Payer: Self-pay | Admitting: Family

## 2017-03-13 MED FILL — AMLODIPINE BESYLATE 5 MG TA: 5 | 90 days supply | Qty: 90 | Fill #1

## 2017-03-13 MED FILL — QUINAPRIL HCL 40 MG TABS: 40 | 30 days supply | Qty: 30 | Fill #0

## 2017-03-29 ENCOUNTER — Encounter: Payer: Self-pay | Admitting: Gastroenterology

## 2017-04-19 MED FILL — QUINAPRIL HCL 40 MG TABS: 40 | 30 days supply | Qty: 30 | Fill #1

## 2017-05-15 MED FILL — QUINAPRIL HCL 40 MG TABS: 40 | 30 days supply | Qty: 30 | Fill #2

## 2017-05-15 MED FILL — POTASSIUM CL ER 10 MEQ TAB: 10 | 90 days supply | Qty: 90 | Fill #1

## 2017-06-10 MED FILL — OMEPRAZOLE 20 MG CAP: 20 | 90 days supply | Qty: 180 | Fill #1

## 2017-06-19 ENCOUNTER — Other Ambulatory Visit: Payer: Self-pay | Admitting: Family

## 2017-06-19 MED FILL — QUINAPRIL HCL 40 MG TABS: 40 | 30 days supply | Qty: 30 | Fill #3

## 2017-06-19 MED FILL — AMLODIPINE BESYLATE 5 MG TA: 5 | 90 days supply | Qty: 90 | Fill #0

## 2017-07-12 ENCOUNTER — Ambulatory Visit: Payer: PPO | Admitting: Family

## 2017-07-12 ENCOUNTER — Encounter: Payer: Self-pay | Admitting: Family

## 2017-07-12 VITALS — BP 142/54 | HR 76 | Resp 16 | Ht 62.0 in | Wt 175.0 lb

## 2017-07-12 DIAGNOSIS — R739 Hyperglycemia, unspecified: Secondary | ICD-10-CM

## 2017-07-12 DIAGNOSIS — I10 Essential (primary) hypertension: Secondary | ICD-10-CM

## 2017-07-12 DIAGNOSIS — K219 Gastro-esophageal reflux disease without esophagitis: Secondary | ICD-10-CM

## 2017-07-12 DIAGNOSIS — E785 Hyperlipidemia, unspecified: Secondary | ICD-10-CM

## 2017-07-12 MED ORDER — QUINAPRIL HCL 40 MG PO TABS
40.0000 mg | ORAL_TABLET | Freq: Every day | ORAL | 1 refills | Status: DC
Start: 2017-07-12 — End: 2018-01-07

## 2017-07-12 MED ORDER — AMLODIPINE BESYLATE 5 MG PO TABS: ORAL_TABLET | ORAL | 0 refills | Status: DC

## 2017-07-12 MED ORDER — OMEPRAZOLE 20 MG PO CPDR: DELAYED_RELEASE_CAPSULE | ORAL | 1 refills | Status: DC

## 2017-07-12 MED ORDER — POTASSIUM CHLORIDE CRYS ER 10 MEQ PO TBCR: EXTENDED_RELEASE_TABLET | ORAL | 1 refills | Status: DC

## 2017-07-12 MED FILL — QUINAPRIL HCL 40 MG TABS: 40 | 90 days supply | Qty: 90 | Fill #0

## 2017-07-12 NOTE — Patient Instructions (Signed)
Please complete lab work prior to leaving. Continue to work on low fat/low cholesterol diet, exercise and weight loss.

## 2017-07-12 NOTE — Progress Notes (Signed)
Subjective:    Patient ID: Miranda DickSylvia W Reith, female    DOB: 02/04/1941, 77 y.o.   MRN: [id number]  HPI  Patient is a 77 yr old female who presents today for follow up.  HTN- maintained on amlodipine, accupril.   BP Readings from Last 3 Encounters:  07/12/17 (!) 142/54  01/11/17 135/60  12/12/16 118/64   gerd- continues omeprazole.  Most days she takes 20 mg a day.  Some days she takes a second tablet if her symptoms flare.  Hyperlipidemia-she is intolerant to statins.  She is trying to work on her diet. Lab Results  Component Value Date   CHOL 314 (H) 03/27/2016   HDL 71.20 03/27/2016   LDLCALC 212 (H) 03/27/2016   LDLDIRECT 158.8 06/10/2008   TRIG 152.0 (H) 03/27/2016   CHOLHDL 4 03/27/2016      Review of Systems    see HPI  Past Medical History:  Diagnosis Date  . Arthritis    osteoarthritis  . Barrett's esophagus   . Cataract    bilateral cateracts removed  . Chronic kidney disease    kidney stones  . Chronic obstructive bronchitis (HCC)    Dr Delford FieldWright  . Colitis   . Colon polyps   . GERD (gastroesophageal reflux disease)   . History of chicken pox   . History of nephrolithiasis   . History of transfusion of packed red blood cells   . Hyperlipidemia   . Hypertension   . IFG (impaired fasting glucose)   . Osteoporosis    pt cannot tolerate fosamax  . 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  [name]. Urinary incontinence      Social History   Socioeconomic History  . Marital status: Widowed    Spouse name: Not on file  . Number of children: Not on file  . Years of education: Not on file  . Highest education level: Not on file  Social Needs  . Financial resource strain: Not on file  . Food insecurity - worry: Not on file  . Food insecurity - inability: Not on file  . Transportation needs - medical: Not on file  . Transportation needs - non-medical: Not on file  Occupational History  . Occupation: Retired    Tobacco Use  . Smoking status: Former Smoker    Years: 15.00    Last attempt to quit: 05/15/1967    Years since quitting: 50.1  . Smokeless tobacco: Never Used  Substance and Sexual Activity  . Alcohol use: No  . Drug use: No  . Sexual activity: No  Other Topics Concern  . Not on file  Social History Narrative   Regular exercise: yes    Past Surgical History:  Procedure Laterality Date  . ABDOMINAL HYSTERECTOMY  1970  . APPENDECTOMY    . CHOLECYSTECTOMY    . LEG SURGERY  2008   right  . TOTAL KNEE ARTHROPLASTY  04/2006   total left knee replacement    Family History  Problem Relation Age of Onset  . Coronary artery disease Mother   . Arthritis Mother   . Tuberculosis Unknown   . Asthma Maternal Grandmother   . Colon cancer Neg Hx   . Esophageal cancer Neg Hx   . Rectal cancer Neg Hx   . Stomach cancer Neg Hx     Allergies  Allergen Reactions  . Atorvastatin Other (See Comments)    REACTION: MUSCLE PAINS  . Diclofenac-Misoprostol Diarrhea  and Nausea And Vomiting  . Fenofibrate Other (See Comments)    REACTION: carpopedal spasms  . Rosuvastatin Other (See Comments)    REACTION: MUSCLE PAINS  . Statins Other (See Comments)    myalgias  . Amoxicillin-Pot Clavulanate Other (See Comments)    REACTION: unspecified  . Penicillins Hives  . Sulfonamide Derivatives Hives  . Tuberculin Ppd Other (See Comments)    Redness at sight    Current Outpatient Medications on File Prior to Visit  Medication Sig Dispense Refill  . Ascorbic Acid (VITAMIN C) 1000 MG tablet Take 1,000 mg by mouth daily.    [name] aspirin 325 MG tablet Take 325 mg by mouth daily.      . beta carotene 15 MG capsule Take 15 mg by mouth daily.      . bifidobacterium infantis (ALIGN) capsule Take 1 capsule by mouth daily.      . Calcium Carbonate-Vitamin D (CALTRATE 600+D) 600-400 MG-UNIT per tablet Take 1 tablet by mouth 2 (two) times daily.    . carboxymethylcellulose (REFRESH PLUS) 0.5 % SOLN Place 1  drop into both eyes 2 (two) times daily.    . Coenzyme Q10 200 MG capsule Take 200 mg by mouth daily.      [name] ibuprofen (ADVIL,MOTRIN) 200 MG tablet Take 800 mg by mouth daily.    . Multiple Vitamin (MULTIVITAMIN) tablet Take 1 tablet by mouth daily.      . niacin 500 MG tablet Take 1,500 mg by mouth at bedtime.    . Omega-3 Fatty Acids (FISH OIL) 1000 MG CAPS Take 1 capsule by mouth every morning.     . vitamin E (VITAMIN E) 400 UNIT capsule Take 400 Units by mouth daily.       No current facility-administered medications on file prior to visit.     BP (!) 142/54 (BP Location: Right Arm, Patient Position: Sitting, Cuff Size: Large)   Pulse 76   Resp 16   Ht 5\' 2"  (1.575 m)   Wt 175 lb (79.4 kg)   LMP 05/14/1968   SpO2 97%   BMI 32.01 kg/m    Objective:   Physical Exam  Constitutional: She is oriented to person, place, and time. She appears well-developed and well-nourished.  Cardiovascular: Normal rate, regular rhythm and normal heart sounds.  No murmur heard. Pulmonary/Chest: Effort normal and breath sounds normal. No respiratory distress. She has no wheezes.  Musculoskeletal: She exhibits no edema.  Neurological: She is alert and oriented to person, place, and time.  Psychiatric: She has a normal mood and affect. Her behavior is normal. Judgment and thought content normal.          Assessment & Plan:  HTN-BP is acceptable for her age continue current medications.  Obtain follow-up A1c.  Hyperglycemia-will obtain follow-up A1c.  GERD- stable on omeprazole 20 mg.  Continue same.  Hyperlipidemia- she is unable to take statins or fenofibrate.  Continue niacin and fish oil.  We will not check follow-up lipid panel at this time as it will not change management.  We reinforced the importance of healthy diet.

## 2017-07-13 ENCOUNTER — Encounter: Payer: Self-pay | Admitting: Family

## 2017-07-13 LAB — BASIC METABOLIC PANEL
BUN: 19 mg/dL (ref 7–25)
CALCIUM: 9.6 mg/dL (ref 8.6–10.4)
CO2: 22 mmol/L (ref 20–32)
Chloride: 104 mmol/L (ref 98–110)
Creat: 0.76 mg/dL (ref 0.60–0.93)
GLUCOSE: 100 mg/dL — AB (ref 65–99)
Potassium: 4.2 mmol/L (ref 3.5–5.3)
SODIUM: 137 mmol/L (ref 135–146)

## 2017-07-13 LAB — HEMOGLOBIN A1C
EAG (MMOL/L): 6.2 (calc)
HEMOGLOBIN A1C: 5.5 %{Hb} (ref <5.7)
MEAN PLASMA GLUCOSE: 111 (calc)

## 2017-08-15 MED FILL — POTASSIUM CL 10 MEQ TAB SA: 10 | 90 days supply | Qty: 90 | Fill #0

## 2017-09-11 MED FILL — AMLODIPINE BESYLATE 5 MG TA: 5 | 90 days supply | Qty: 90 | Fill #0

## 2017-09-25 ENCOUNTER — Ambulatory Visit: Payer: PPO | Admitting: *Deleted

## 2017-09-26 ENCOUNTER — Ambulatory Visit: Payer: PPO | Admitting: *Deleted

## 2017-10-16 MED FILL — QUINAPRIL HCL 40 MG TABS: 40 | 90 days supply | Qty: 90 | Fill #1

## 2017-11-04 ENCOUNTER — Telehealth: Payer: Self-pay | Admitting: Family

## 2017-11-04 NOTE — Telephone Encounter (Signed)
Copied from CRM [id number]. Topic: Quick Communication - See Telephone Encounter >> Nov 04, 2017  1:47 PM Windy KalataMichael, Nathon Stefanski L, NT wrote: CRM for notification. See Telephone encounter for: 11/04/17.  Erskine SquibbJane is calling from the [place] [facility] and she is needing clarification on omeprazole (PRILOSEC) 20 MG capsule. She needs to know is the max a day 2. She is unsure. Cb#  [phone]

## 2017-11-04 NOTE — Telephone Encounter (Signed)
Yes max 2 tabs omeprazole/day.

## 2017-11-05 MED FILL — OMEPRAZOLE 20 MG CPDR: 20 | 90 days supply | Qty: 180 | Fill #0

## 2017-11-05 NOTE — Telephone Encounter (Signed)
Notified Meredith at [facility].

## 2017-11-12 MED FILL — POTASSIUM CL 10 MEQ TAB SA: 10 | 90 days supply | Qty: 90 | Fill #1

## 2017-12-11 ENCOUNTER — Other Ambulatory Visit: Payer: Self-pay | Admitting: Family

## 2017-12-11 MED FILL — AMLODIPINE BESYLATE 5 MG TA: 5 | 90 days supply | Qty: 90 | Fill #0

## 2018-01-07 ENCOUNTER — Other Ambulatory Visit: Payer: Self-pay | Admitting: Family

## 2018-01-10 MED FILL — QUINAPRIL HCL 40 MG TABS: 40 | 90 days supply | Qty: 90 | Fill #0

## 2018-01-17 ENCOUNTER — Encounter: Payer: Self-pay | Admitting: Family

## 2018-01-17 ENCOUNTER — Ambulatory Visit (INDEPENDENT_AMBULATORY_CARE_PROVIDER_SITE_OTHER): Payer: PPO | Admitting: Family

## 2018-01-17 VITALS — BP 144/67 | HR 84 | Temp 98.4°F | Resp 16 | Ht 62.0 in | Wt 185.0 lb

## 2018-01-17 DIAGNOSIS — E7800 Pure hypercholesterolemia, unspecified: Secondary | ICD-10-CM

## 2018-01-17 DIAGNOSIS — I10 Essential (primary) hypertension: Secondary | ICD-10-CM | POA: Diagnosis not present

## 2018-01-17 DIAGNOSIS — R739 Hyperglycemia, unspecified: Secondary | ICD-10-CM

## 2018-01-17 NOTE — Progress Notes (Signed)
Subjective:    Patient ID: [name], female    DOB: January 25, 1941, 77 y.o.   MRN: [id number]  HPI  Patient is a 77 year old female who presents today for routine follow-up.  Hypertension- maintained on amlodipine and accupril. She denies swelling, cp or shortness of breath.  BP Readings from Last 3 Encounters:  01/17/18 (!) 144/67  07/12/17 (!) 142/54  01/11/17 135/60   Hyperlipidemia- she is intolerant to statins. Lab Results  Component Value Date   CHOL 314 (H) 03/27/2016   HDL 71.20 03/27/2016   LDLCALC 212 (H) 03/27/2016   LDLDIRECT 158.8 06/10/2008   TRIG 152.0 (H) 03/27/2016   CHOLHDL 4 03/27/2016   Hyperglycemia- she has gained 10 pounds since her last visit he has gained 14 pounds since last summer.  Reports that she has been eating more fast food and going out to eat more often.   Lab Results  Component Value Date   HGBA1C 5.5 07/12/2017   Wt Readings from Last 3 Encounters:  01/17/18 185 lb (83.9 kg)  07/12/17 175 lb (79.4 kg)  01/11/17 171 lb (77.6 kg)       Review of Systems See HPI  Past Medical History:  Diagnosis Date  . Arthritis    osteoarthritis  . Barrett's esophagus   . Cataract    bilateral cateracts removed  . Chronic kidney disease    kidney stones  . Chronic obstructive bronchitis (HCC)    Dr Delford Field  . Colitis   . Colon polyps   . GERD (gastroesophageal reflux disease)   . History of chicken pox   . History of nephrolithiasis   . History of transfusion of packed red blood cells   . Hyperlipidemia   . Hypertension   . IFG (impaired fasting glucose)   . Osteoporosis    pt cannot tolerate fosamax  . 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  [name] Urinary incontinence      Social History   Socioeconomic History  . Marital status: Widowed    Spouse name: Not on file  . Number of children: Not on file  . Years of education: Not on file  . Highest education level: Not on file    Occupational History  . Occupation: Retired  Engineer, production  . Financial resource strain: Not on file  . Food insecurity:    Worry: Not on file    Inability: Not on file  . Transportation needs:    Medical: Not on file    Non-medical: Not on file  Tobacco Use  . Smoking status: Former Smoker    Years: 15.00    Last attempt to quit: 05/15/1967    Years since quitting: 50.7  . Smokeless tobacco: Never Used  Substance and Sexual Activity  . Alcohol use: No  . Drug use: No  . Sexual activity: Never  Lifestyle  . Physical activity:    Days per week: Not on file    Minutes per session: Not on file  . Stress: Not on file  Relationships  . Social connections:    Talks on phone: Not on file    Gets together: Not on file    Attends religious service: Not on file    Active member of club or organization: Not on file    Attends meetings of clubs or organizations: Not on file    Relationship status: Not on file  . Intimate partner violence:    Fear  of current or ex partner: Not on file    Emotionally abused: Not on file    Physically abused: Not on file    Forced sexual activity: Not on file  Other Topics Concern  . Not on file  Social History Narrative   Regular exercise: yes    Past Surgical History:  Procedure Laterality Date  . ABDOMINAL HYSTERECTOMY  1970  . APPENDECTOMY    . CHOLECYSTECTOMY    . LEG SURGERY  2008   right  . TOTAL KNEE ARTHROPLASTY  04/2006   total left knee replacement    Family History  Problem Relation Age of Onset  . Coronary artery disease Mother   . Arthritis Mother   . Tuberculosis Unknown   . Asthma Maternal Grandmother   . Colon cancer Neg Hx   . Esophageal cancer Neg Hx   . Rectal cancer Neg Hx   . Stomach cancer Neg Hx     Allergies  Allergen Reactions  . Atorvastatin Other (See Comments)    REACTION: MUSCLE PAINS  . Diclofenac-Misoprostol Diarrhea and Nausea And Vomiting  . Fenofibrate Other (See Comments)    REACTION:  carpopedal spasms  . Rosuvastatin Other (See Comments)    REACTION: MUSCLE PAINS  . Statins Other (See Comments)    myalgias  . Amoxicillin-Pot Clavulanate Other (See Comments)    REACTION: unspecified  . Penicillins Hives  . Sulfonamide Derivatives Hives  . Tuberculin Ppd Other (See Comments)    Redness at sight    Current Outpatient Medications on File Prior to Visit  Medication Sig Dispense Refill  . amLODipine (NORVASC) 5 MG tablet TAKE 1 TABLET (5 MG TOTAL) BY MOUTH DAILY. 90 tablet 1  . Ascorbic Acid (VITAMIN C) 1000 MG tablet Take 1,000 mg by mouth daily.    [name] aspirin 325 MG tablet Take 325 mg by mouth daily.      . beta carotene 15 MG capsule Take 15 mg by mouth daily.      . bifidobacterium infantis (ALIGN) capsule Take 1 capsule by mouth daily.      . Calcium Carbonate-Vitamin D (CALTRATE 600+D) 600-400 MG-UNIT per tablet Take 1 tablet by mouth 2 (two) times daily.    . carboxymethylcellulose (REFRESH PLUS) 0.5 % SOLN Place 1 drop into both eyes 2 (two) times daily.    . Coenzyme Q10 200 MG capsule Take 200 mg by mouth daily.      [name] ibuprofen (ADVIL,MOTRIN) 200 MG tablet Take 800 mg by mouth daily.    . Multiple Vitamin (MULTIVITAMIN) tablet Take 1 tablet by mouth daily.      . niacin 500 MG tablet Take 1,500 mg by mouth at bedtime.    . Omega-3 Fatty Acids (FISH OIL) 1000 MG CAPS Take 1 capsule by mouth every morning.     [name] omeprazole (PRILOSEC) 20 MG capsule TAKE 1 CAPSULE BY MOUTH DAILY, AND THEN 1 CAPSULE AS NEEDED (Patient taking differently: TAKE 1 CAPSULE BY MOUTH DAILY, AND THEN 1 CAPSULE AS NEEDED. Max 2 tabs per day.) 180 capsule 1  . potassium chloride (K-DUR,KLOR-CON) 10 MEQ tablet TAKE 1 TABLET (10 MEQ TOTAL) BY MOUTH DAILY. 90 tablet 1  . quinapril (ACCUPRIL) 40 MG tablet TAKE 1 TABLET (40 MG TOTAL) BY MOUTH AT BEDTIME. 90 tablet 1  . vitamin E (VITAMIN E) 400 UNIT capsule Take 400 Units by mouth daily.       No current facility-administered medications on file  prior to visit.  BP (!) 144/67 (BP Location: Right Arm, Patient Position: Sitting, Cuff Size: Small)   Pulse 84   Temp 98.4 F (36.9 C) (Oral)   Resp 16   Ht 5\' 2"  (1.575 m)   Wt 185 lb (83.9 kg)   LMP 05/14/1968   SpO2 97%   BMI 33.84 kg/m       Objective:   Physical Exam  Constitutional: She is oriented to person, place, and time. She appears well-developed and well-nourished.  Cardiovascular: Normal rate, regular rhythm and normal heart sounds.  No murmur heard. Pulmonary/Chest: Effort normal and breath sounds normal. No respiratory distress. She has no wheezes.  Musculoskeletal: She exhibits no edema.  Neurological: She is alert and oriented to person, place, and time.  Skin: Skin is warm and dry.  Psychiatric: She has a normal mood and affect. Her behavior is normal. Judgment and thought content normal.          Assessment & Plan:  Hyperlipidemia- intolerant to statins.  Discussed diet exercise and weight loss.  Hyperglycemia-will obtain follow-up A1c.  Hypertension-blood pressure is acceptable for her age.  Continue current meds.  Obtain follow-up basic metabolic panel.

## 2018-01-17 NOTE — Patient Instructions (Signed)
Please complete lab work prior to leaving. Work on [employer], regular exercise and weight loss.  Make sure to get a flu vaccine this season.

## 2018-01-18 LAB — LIPID PANEL
CHOLESTEROL: 304 mg/dL — AB (ref <200)
HDL: 50 mg/dL — ABNORMAL LOW (ref >50)
LDL CHOLESTEROL (CALC): 201 mg/dL — AB
Non-HDL Cholesterol (Calc): 254 mg/dL (calc) — ABNORMAL HIGH (ref <130)
Total CHOL/HDL Ratio: 6.1 (calc) — ABNORMAL HIGH (ref <5.0)
Triglycerides: 312 mg/dL — ABNORMAL HIGH (ref <150)

## 2018-01-18 LAB — BASIC METABOLIC PANEL
BUN: 14 mg/dL (ref 7–25)
CALCIUM: 9.2 mg/dL (ref 8.6–10.4)
CHLORIDE: 106 mmol/L (ref 98–110)
CO2: 21 mmol/L (ref 20–32)
Creat: 0.71 mg/dL (ref 0.60–0.93)
Glucose, Bld: 91 mg/dL (ref 65–99)
Potassium: 4.2 mmol/L (ref 3.5–5.3)
SODIUM: 138 mmol/L (ref 135–146)

## 2018-01-18 LAB — HEMOGLOBIN A1C
EAG (MMOL/L): 6.2 (calc)
HEMOGLOBIN A1C: 5.5 %{Hb} (ref <5.7)
MEAN PLASMA GLUCOSE: 111 (calc)

## 2018-01-19 ENCOUNTER — Encounter: Payer: Self-pay | Admitting: Family

## 2018-01-27 ENCOUNTER — Telehealth: Payer: Self-pay | Admitting: Family

## 2018-01-27 MED ORDER — EZETIMIBE 10 MG PO TABS: 10.0000 mg | ORAL_TABLET | Freq: Every day | ORAL | 5 refills | Status: DC

## 2018-01-27 MED FILL — EZETIMIBE 10 MG TABLET: 10 | 30 days supply | Qty: 30 | Fill #0

## 2018-01-27 NOTE — Telephone Encounter (Signed)
rx sent

## 2018-01-27 NOTE — Telephone Encounter (Signed)
Copied from CRM [id number]. Topic: Quick Communication - Rx Refill/Question >> Jan 27, 2018 10:20 AM Burchel, Abbi R wrote: Medication: ZETIA 10 MG tablet  Pt states she would like to start taking the generic version of this medication bc it is considerably cheaper.  Please advise.    [facility] [place] [facility] [address] [place] [address] Phone: [phone] Fax: [phone]

## 2018-02-12 ENCOUNTER — Other Ambulatory Visit: Payer: Self-pay | Admitting: Family

## 2018-02-12 MED FILL — POTASSIUM CHL ER M10 TABLET: 10 | 90 days supply | Qty: 90 | Fill #0

## 2018-02-25 MED FILL — EZETIMIBE 10 MG TABLET: 10 | 30 days supply | Qty: 30 | Fill #1

## 2018-03-03 MED FILL — OMEPRAZOLE 20 MG CPDR: 20 | 90 days supply | Qty: 180 | Fill #1

## 2018-03-11 MED FILL — AMLODIPINE BESYLATE 5 MG TA: 5 | 90 days supply | Qty: 90 | Fill #1

## 2018-03-27 MED FILL — EZETIMIBE 10 MG TABLET: 10 | 30 days supply | Qty: 30 | Fill #2

## 2018-04-08 MED FILL — QUINAPRIL HCL 40 MG TABS: 40 | 90 days supply | Qty: 90 | Fill #1

## 2018-04-23 MED FILL — EZETIMIBE 10 MG TABS: 10 | 30 days supply | Qty: 30 | Fill #3

## 2018-05-09 MED FILL — POTASSIUM CHL ER M10 TABLET: 10 | 90 days supply | Qty: 90 | Fill #1

## 2018-05-26 MED FILL — EZETIMIBE 10 MG TABS: 10 | 30 days supply | Qty: 30 | Fill #4

## 2018-06-12 ENCOUNTER — Other Ambulatory Visit: Payer: Self-pay | Admitting: Family

## 2018-06-12 MED FILL — AMLODIPINE BESYLATE 5 MG TA: 5 | 90 days supply | Qty: 90 | Fill #0

## 2018-06-30 MED FILL — EZETIMIBE 10 MG TABS: 10 | 30 days supply | Qty: 30 | Fill #5

## 2018-07-07 ENCOUNTER — Other Ambulatory Visit: Payer: Self-pay | Admitting: Family

## 2018-07-08 MED FILL — OMEPRAZOLE 20 MG CPDR: 20 | 90 days supply | Qty: 180 | Fill #0

## 2018-07-08 MED FILL — QUINAPRIL HCL 40 MG TABS: 40 | 90 days supply | Qty: 90 | Fill #0

## 2018-07-18 ENCOUNTER — Ambulatory Visit: Payer: PPO | Admitting: Family

## 2018-07-18 ENCOUNTER — Encounter: Payer: PPO | Admitting: Family

## 2018-07-22 ENCOUNTER — Other Ambulatory Visit: Payer: Self-pay | Admitting: Family

## 2018-07-23 ENCOUNTER — Encounter: Payer: Self-pay | Admitting: Family

## 2018-07-23 ENCOUNTER — Other Ambulatory Visit: Payer: Self-pay

## 2018-07-23 ENCOUNTER — Telehealth: Payer: Self-pay | Admitting: Family

## 2018-07-23 ENCOUNTER — Ambulatory Visit (INDEPENDENT_AMBULATORY_CARE_PROVIDER_SITE_OTHER): Payer: PPO | Admitting: Family

## 2018-07-23 ENCOUNTER — Encounter (HOSPITAL_BASED_OUTPATIENT_CLINIC_OR_DEPARTMENT_OTHER): Payer: Self-pay

## 2018-07-23 ENCOUNTER — Ambulatory Visit (HOSPITAL_BASED_OUTPATIENT_CLINIC_OR_DEPARTMENT_OTHER)
Admission: RE | Admit: 2018-07-23 | Discharge: 2018-07-23 | Disposition: A | Discharge status: Home / Self Care | Payer: PPO | Source: Ambulatory Visit | Attending: Family | Admitting: Family

## 2018-07-23 ENCOUNTER — Other Ambulatory Visit (HOSPITAL_BASED_OUTPATIENT_CLINIC_OR_DEPARTMENT_OTHER): Payer: Self-pay | Admitting: Medical

## 2018-07-23 VITALS — BP 153/64 | HR 87 | Temp 97.9°F | Resp 16 | Ht 62.0 in | Wt 183.0 lb

## 2018-07-23 DIAGNOSIS — K219 Gastro-esophageal reflux disease without esophagitis: Secondary | ICD-10-CM

## 2018-07-23 DIAGNOSIS — I10 Essential (primary) hypertension: Secondary | ICD-10-CM | POA: Diagnosis not present

## 2018-07-23 DIAGNOSIS — M810 Age-related osteoporosis without current pathological fracture: Secondary | ICD-10-CM

## 2018-07-23 DIAGNOSIS — Z780 Asymptomatic menopausal state: Secondary | ICD-10-CM | POA: Diagnosis not present

## 2018-07-23 DIAGNOSIS — Z1231 Encounter for screening mammogram for malignant neoplasm of breast: Secondary | ICD-10-CM | POA: Insufficient documentation

## 2018-07-23 DIAGNOSIS — Z0000 Encounter for general adult medical examination without abnormal findings: Secondary | ICD-10-CM | POA: Diagnosis not present

## 2018-07-23 DIAGNOSIS — Z1239 Encounter for other screening for malignant neoplasm of breast: Secondary | ICD-10-CM

## 2018-07-23 DIAGNOSIS — K2270 Barrett's esophagus without dysplasia: Secondary | ICD-10-CM | POA: Diagnosis not present

## 2018-07-23 LAB — COMPREHENSIVE METABOLIC PANEL
ALBUMIN: 4.3 g/dL (ref 3.5–5.2)
ALT: 20 U/L (ref 0–35)
AST: 18 U/L (ref 0–37)
Alkaline Phosphatase: 74 U/L (ref 39–117)
BUN: 14 mg/dL (ref 6–23)
CALCIUM: 9.3 mg/dL (ref 8.4–10.5)
CHLORIDE: 103 meq/L (ref 96–112)
CO2: 27 meq/L (ref 19–32)
Creatinine, Ser: 0.71 mg/dL (ref 0.40–1.20)
GFR: 79.77 mL/min (ref >60.00)
Glucose, Bld: 94 mg/dL (ref 70–99)
Potassium: 5.1 mEq/L (ref 3.5–5.1)
Sodium: 138 mEq/L (ref 135–145)
Total Bilirubin: 0.4 mg/dL (ref 0.2–1.2)
Total Protein: 6.5 g/dL (ref 6.0–8.3)

## 2018-07-23 MED FILL — EZETIMIBE 10 MG TABS: 10 | 30 days supply | Qty: 30 | Fill #0

## 2018-07-23 NOTE — Progress Notes (Signed)
Subjective:    Patient ID: [name], female    DOB: 1940/12/26, 78 y.o.   MRN: [id number]  HPI  Patient is a 78 yr old female who presents today for cpx.  Patient presents today for complete physical.  Immunizations: up to date Diet: fair Wt Readings from Last 3 Encounters:  07/23/18 183 lb (83 kg)  01/17/18 185 lb (83.9 kg)  07/12/17 175 lb (79.4 kg)  Exercise: plans to begin treadmill Colonoscopy: declines, also declines cologuard, declines EGD as well Dexa: 2016- due Pap Smear: N/A Mammogram: due Vision: due, she goes to Dr. Lucretia Roers  Hyperlipidemia-declines statin therapy.  Maintained on Zetia. Lab Results  Component Value Date   CHOL 304 (H) 01/17/2018   HDL 50 (L) 01/17/2018   LDLCALC 201 (H) 01/17/2018   LDLDIRECT 158.8 06/10/2008   TRIG 312 (H) 01/17/2018   CHOLHDL 6.1 (H) 01/17/2018   GERD/barrett's esophagus- reports rare heartburn which is diet related.  Declines follow-up EGD at this time.  States she will think about it.  She continues proton pump inhibitor.  Hypertension- continues amlodipine 5 mg as well as Accupril 40 mg daily. BP Readings from Last 3 Encounters:  07/23/18 (!) 153/64  01/17/18 (!) 144/67  07/12/17 (!) 142/54    Review of Systems  Constitutional: Negative for unexpected weight change.  HENT: Negative for rhinorrhea.   Respiratory: Negative for cough and shortness of breath.   Cardiovascular: Negative for chest pain and leg swelling.  Gastrointestinal: Negative for constipation and diarrhea.  Genitourinary: Negative for dysuria and frequency.  Musculoskeletal: Negative for arthralgias and myalgias.       Occasional leg cramps  Skin: Negative for rash.  Neurological: Negative for headaches.  Hematological: Negative for adenopathy.  Psychiatric/Behavioral:       Denies depression/anxiety   Past Medical History:  Diagnosis Date  . Arthritis    osteoarthritis  . Barrett's esophagus   . Cataract    bilateral cateracts  removed  . Chronic kidney disease    kidney stones  . Chronic obstructive bronchitis (HCC)    Dr Delford Field  . Colitis   . Colon polyps   . GERD (gastroesophageal reflux disease)   . History of chicken pox   . History of nephrolithiasis   . History of transfusion of packed red blood cells   . Hyperlipidemia   . Hypertension   . IFG (impaired fasting glucose)   . Osteoporosis    pt cannot tolerate fosamax  . 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  [name] Urinary incontinence      Social History   Socioeconomic History  . Marital status: Widowed    Spouse name: Not on file  . Number of children: Not on file  . Years of education: Not on file  . Highest education level: Not on file  Occupational History  . Occupation: Retired  Engineer, production  . Financial resource strain: Not on file  . Food insecurity:    Worry: Not on file    Inability: Not on file  . Transportation needs:    Medical: Not on file    Non-medical: Not on file  Tobacco Use  . Smoking status: Former Smoker    Years: 15.00    Last attempt to quit: 05/15/1967    Years since quitting: 51.2  . Smokeless tobacco: Never Used  Substance and Sexual Activity  . Alcohol use: No  . Drug use: No  .  Sexual activity: Never  Lifestyle  . Physical activity:    Days per week: Not on file    Minutes per session: Not on file  . Stress: Not on file  Relationships  . Social connections:    Talks on phone: Not on file    Gets together: Not on file    Attends religious service: Not on file    Active member of club or organization: Not on file    Attends meetings of clubs or organizations: Not on file    Relationship status: Not on file  . Intimate partner violence:    Fear of current or ex partner: Not on file    Emotionally abused: Not on file    Physically abused: Not on file    Forced sexual activity: Not on file  Other Topics Concern  . Not on file  Social History Narrative    Regular exercise: yes    Past Surgical History:  Procedure Laterality Date  . ABDOMINAL HYSTERECTOMY  1970  . APPENDECTOMY    . CHOLECYSTECTOMY    . LEG SURGERY  2008   right  . TOTAL KNEE ARTHROPLASTY  04/2006   total left knee replacement    Family History  Problem Relation Age of Onset  . Coronary artery disease Mother   . Arthritis Mother   . Tuberculosis Unknown   . Asthma Maternal Grandmother   . Colon cancer Neg Hx   . Esophageal cancer Neg Hx   . Rectal cancer Neg Hx   . Stomach cancer Neg Hx     Allergies  Allergen Reactions  . Atorvastatin Other (See Comments)    REACTION: MUSCLE PAINS  . Diclofenac-Misoprostol Diarrhea and Nausea And Vomiting  . Fenofibrate Other (See Comments)    REACTION: carpopedal spasms  . Rosuvastatin Other (See Comments)    REACTION: MUSCLE PAINS  . Statins Other (See Comments)    myalgias  . Amoxicillin-Pot Clavulanate Other (See Comments)    REACTION: unspecified  . Penicillins Hives  . Sulfonamide Derivatives Hives  . Tuberculin Ppd Other (See Comments)    Redness at sight    Current Outpatient Medications on File Prior to Visit  Medication Sig Dispense Refill  . amLODipine (NORVASC) 5 MG tablet TAKE 1 TABLET BY MOUTH ONCE DAILY 90 tablet 1  . Ascorbic Acid (VITAMIN C) 1000 MG tablet Take 1,000 mg by mouth daily.    [name] aspirin 325 MG tablet Take 325 mg by mouth daily.      . beta carotene 15 MG capsule Take 15 mg by mouth daily.      . bifidobacterium infantis (ALIGN) capsule Take 1 capsule by mouth daily.      . Calcium Carbonate-Vitamin D (CALTRATE 600+D) 600-400 MG-UNIT per tablet Take 1 tablet by mouth 2 (two) times daily.    . carboxymethylcellulose (REFRESH PLUS) 0.5 % SOLN Place 1 drop into both eyes 2 (two) times daily.    . Coenzyme Q10 200 MG capsule Take 200 mg by mouth daily.      [name] ezetimibe (ZETIA) 10 MG tablet Take 1 tablet (10 mg total) by mouth daily. 30 tablet 5  . ibuprofen (ADVIL,MOTRIN) 200 MG tablet  Take 800 mg by mouth daily.    . Multiple Vitamin (MULTIVITAMIN) tablet Take 1 tablet by mouth daily.      . niacin 500 MG tablet Take 1,500 mg by mouth at bedtime.    . Omega-3 Fatty Acids (FISH OIL) 1000 MG CAPS Take 1 capsule  by mouth every morning.     [name] omeprazole (PRILOSEC) 20 MG capsule TAKE 1 CAPSULE BY MOUTH DAILY, AND THEN 1 CAPSULE AS NEEDED. MAX OF 2 CAPSULES PER DAY 180 capsule 1  . potassium chloride (K-DUR,KLOR-CON) 10 MEQ tablet TAKE 1 TABLET BY MOUTH DAILY. 90 tablet 1  . quinapril (ACCUPRIL) 40 MG tablet TAKE 1 TABLET (40 MG TOTAL) BY MOUTH AT BEDTIME. 90 tablet 1  . vitamin E (VITAMIN E) 400 UNIT capsule Take 400 Units by mouth daily.       No current facility-administered medications on file prior to visit.     BP (!) 153/64 (BP Location: Right Arm, Patient Position: Sitting, Cuff Size: Normal)   Pulse 87   Temp 97.9 F (36.6 C) (Oral)   Resp 16   Ht 5\' 2"  (1.575 m)   Wt 183 lb (83 kg)   LMP 05/14/1968   SpO2 98%   BMI 33.47 kg/m       Objective:   Physical Exam  Physical Exam  Constitutional: She is oriented to person, place, and time. She appears well-developed and well-nourished. No distress.  HENT:  Head: Normocephalic and atraumatic.  Right Ear: Tympanic membrane and ear canal normal.  Left Ear: Tympanic membrane and ear canal normal.  Mouth/Throat: Oropharynx is clear and moist.  Eyes: Pupils are equal, round, and reactive to light. No scleral icterus.  Neck: Normal range of motion. No thyromegaly present.  Cardiovascular: Normal rate and regular rhythm.   No murmur heard. Pulmonary/Chest: Effort normal and breath sounds normal. No respiratory distress. He has no wheezes. She has no rales. She exhibits no tenderness.  Abdominal: Soft. Bowel sounds are normal. She exhibits no distension and no mass. There is no tenderness. There is no rebound and no guarding.  Musculoskeletal: She exhibits no edema.  Lymphadenopathy:    She has no cervical  adenopathy.  Neurological: She is alert and oriented to person, place, and time. She has normal patellar reflexes. She exhibits normal muscle tone. Coordination normal.  Skin: Skin is warm and dry.  Psychiatric: She has a normal mood and affect. Her behavior is normal. Judgment and thought content normal.          Assessment & Plan:  Preventive care-discussed healthy diet, exercise, and weight loss.  Due for follow-up bone density and mammogram.  Orders placed.  Declines follow-up: EGD at this time.  Immunizations reviewed and up-to-date with the exception of tetanus.  She declines tetanus.  Hypertension-fair blood pressure given her age.  Continue current meds/doses.  Hyperlipidemia- historically uncontrolled.  Declines statin therapy.  Reinforced importance of low-fat low-cholesterol diet exercise and weight loss.  GERD/Barrett's esophagus- continue PPI.  Declines follow-up EGD.       Assessment & Plan:

## 2018-07-23 NOTE — Patient Instructions (Addendum)
Please complete lab work prior to leaving.  Schedule your routine eye exam.  Schedule mammogram/bone density on the first floor.

## 2018-07-23 NOTE — Telephone Encounter (Signed)
Please let pt know that bone density shows bad osteoporosis.  I would recommend prolia which is injected in the office every 6 months. This will not affect her esophagus like fosamax. Also caltrate 600mg  +D twice daily and regular walking. If she is agreeable let me know and I will arrange prolia.

## 2018-07-23 NOTE — Telephone Encounter (Signed)
Also, kidney function and electrolytes are normal.

## 2018-07-24 NOTE — Telephone Encounter (Signed)
Lm for patient to call us back about bone density results. Ok for triage to release this message to patient.

## 2018-07-28 NOTE — Telephone Encounter (Signed)
Patient advised of results, she will like to proceed with Prolia.

## 2018-08-08 ENCOUNTER — Other Ambulatory Visit: Payer: Self-pay | Admitting: Family

## 2018-08-11 MED FILL — POTASSIUM CHL ER M10 TABLET: 10 | 90 days supply | Qty: 90 | Fill #0

## 2018-08-12 NOTE — Telephone Encounter (Signed)
Hi [name], can we please work on prolia benefits so we can bring her in when we are done with the shelter in place?  Thanks!

## 2018-08-25 MED FILL — EZETIMIBE 10 MG TABS: 10 | 30 days supply | Qty: 30 | Fill #1

## 2018-09-04 MED FILL — AMLODIPINE BESYLATE 5 MG TA: 5 | 90 days supply | Qty: 90 | Fill #1

## 2018-09-23 MED FILL — EZETIMIBE 10 MG TABS: 10 | 30 days supply | Qty: 30 | Fill #2

## 2018-10-03 NOTE — Telephone Encounter (Signed)
Verified patient in portal, waiting on summary of benefits from patients insurance company.

## 2018-10-07 MED FILL — QUINAPRIL HCL 40 MG TABS: 40 | 90 days supply | Qty: 90 | Fill #1

## 2018-10-22 MED FILL — EZETIMIBE 10 MG TABS: 10 | 30 days supply | Qty: 30 | Fill #3

## 2018-11-10 MED FILL — POTASSIUM CHL ER M10 TABLET: 10 | 90 days supply | Qty: 90 | Fill #1

## 2018-11-11 ENCOUNTER — Ambulatory Visit (INDEPENDENT_AMBULATORY_CARE_PROVIDER_SITE_OTHER): Payer: PPO

## 2018-11-11 ENCOUNTER — Other Ambulatory Visit: Payer: Self-pay

## 2018-11-11 DIAGNOSIS — M810 Age-related osteoporosis without current pathological fracture: Secondary | ICD-10-CM | POA: Diagnosis not present

## 2018-11-11 MED ORDER — DENOSUMAB 60 MG/ML ~~LOC~~ SOSY
60.0000 mg | PREFILLED_SYRINGE | Freq: Once | SUBCUTANEOUS | Status: AC
  Administered 2018-11-11: 60 mg via SUBCUTANEOUS

## 2018-11-11 NOTE — Progress Notes (Signed)
Reviewed. ° ° °Charlot Gouin S O'Sullivan NP °

## 2018-11-11 NOTE — Progress Notes (Signed)
Pre visit review using our clinic review tool, if applicable. No additional management support is needed unless otherwise documented below in the visit note.  Patient here for prolia injection. 85mL prolia given in left arm sq.Patient tolerated well. Due in 6 months for next injection.

## 2018-11-12 MED FILL — OMEPRAZOLE 20 MG CPDR: 20 | 90 days supply | Qty: 180 | Fill #1

## 2018-11-24 MED FILL — EZETIMIBE 10 MG TABS: 10 | 30 days supply | Qty: 30 | Fill #4

## 2018-12-02 ENCOUNTER — Other Ambulatory Visit: Payer: Self-pay | Admitting: Family

## 2018-12-02 MED FILL — AMLODIPINE BESYLATE 5 MG TA: 5 | 90 days supply | Qty: 90 | Fill #0

## 2018-12-18 MED FILL — EZETIMIBE 10 MG TABS: 10 | 30 days supply | Qty: 30 | Fill #5

## 2019-01-07 ENCOUNTER — Other Ambulatory Visit: Payer: Self-pay | Admitting: Family

## 2019-01-07 MED FILL — QUINAPRIL 40 MG TABLET: 40 | 30 days supply | Qty: 30 | Fill #0

## 2019-01-20 ENCOUNTER — Ambulatory Visit (INDEPENDENT_AMBULATORY_CARE_PROVIDER_SITE_OTHER): Payer: PPO | Admitting: Family

## 2019-01-20 ENCOUNTER — Other Ambulatory Visit: Payer: Self-pay

## 2019-01-20 ENCOUNTER — Telehealth: Payer: Self-pay | Admitting: Family

## 2019-01-20 ENCOUNTER — Other Ambulatory Visit: Payer: PPO

## 2019-01-20 VITALS — BP 148/85 | HR 99 | Wt 176.0 lb

## 2019-01-20 DIAGNOSIS — I10 Essential (primary) hypertension: Secondary | ICD-10-CM

## 2019-01-20 DIAGNOSIS — E785 Hyperlipidemia, unspecified: Secondary | ICD-10-CM

## 2019-01-20 DIAGNOSIS — K219 Gastro-esophageal reflux disease without esophagitis: Secondary | ICD-10-CM

## 2019-01-20 DIAGNOSIS — E7800 Pure hypercholesterolemia, unspecified: Secondary | ICD-10-CM

## 2019-01-20 DIAGNOSIS — E876 Hypokalemia: Secondary | ICD-10-CM

## 2019-01-20 LAB — COMPREHENSIVE METABOLIC PANEL
ALT: 28 U/L (ref 0–35)
AST: 22 U/L (ref 0–37)
Albumin: 4.3 g/dL (ref 3.5–5.2)
Alkaline Phosphatase: 61 U/L (ref 39–117)
BUN: 14 mg/dL (ref 6–23)
CO2: 24 mEq/L (ref 19–32)
Calcium: 9.4 mg/dL (ref 8.4–10.5)
Chloride: 97 mEq/L (ref 96–112)
Creatinine, Ser: 0.74 mg/dL (ref 0.40–1.20)
GFR: 75.95 mL/min (ref >60.00)
Glucose, Bld: 103 mg/dL — ABNORMAL HIGH (ref 70–99)
Potassium: 5.2 mEq/L — ABNORMAL HIGH (ref 3.5–5.1)
Sodium: 130 mEq/L — ABNORMAL LOW (ref 135–145)
Total Bilirubin: 0.5 mg/dL (ref 0.2–1.2)
Total Protein: 6.4 g/dL (ref 6.0–8.3)

## 2019-01-20 LAB — LIPID PANEL
Cholesterol: 223 mg/dL — ABNORMAL HIGH (ref 0–200)
HDL: 55.5 mg/dL (ref >39.00)
LDL Cholesterol: 141 mg/dL — ABNORMAL HIGH (ref 0–99)
NonHDL: 167.02
Total CHOL/HDL Ratio: 4
Triglycerides: 130 mg/dL (ref 0.0–149.0)
VLDL: 26 mg/dL (ref 0.0–40.0)

## 2019-01-20 MED ORDER — POTASSIUM CHLORIDE CRYS ER 10 MEQ PO TBCR: 10.0000 meq | EXTENDED_RELEASE_TABLET | Freq: Every day | ORAL | 1 refills | Status: DC

## 2019-01-20 MED ORDER — EZETIMIBE 10 MG PO TABS: 10.0000 mg | ORAL_TABLET | Freq: Every day | ORAL | 1 refills | Status: DC

## 2019-01-20 MED FILL — EZETIMIBE 10 MG TABS: 10 | 90 days supply | Qty: 90 | Fill #0

## 2019-01-20 MED FILL — POTASSIUM CL ER 10 MEQ TAB: 10 | 90 days supply | Qty: 90 | Fill #0

## 2019-01-20 NOTE — Telephone Encounter (Signed)
Please call pt and let her know that her potassium is elevated. She should stop kdure. Repeat bmet in 1 week, dx hypokalemia.

## 2019-01-20 NOTE — Progress Notes (Signed)
Virtual Visit via Telephone Note  I connected with [name] on 01/21/19 at  7:40 AM EDT by telephone and verified that I am speaking with the correct person using two identifiers.  Location: Patient: home Provider: work   I discussed the limitations, risks, security and privacy concerns of performing an evaluation and management service by telephone and the availability of in person appointments. I also discussed with the patient that there may be a patient responsible charge related to this service. The patient expressed understanding and agreed to proceed.   History of Present Illness:  Patient is a 78 yr old female who presents today for follow up.   HTN- continues amlodipine 5mg .  BP Readings from Last 3 Encounters:  01/21/19 (!) 148/85  07/23/18 (!) 153/64  01/17/18 (!) 144/67   GERD- Patient is maintained on omeprazole. Reports symptoms remain stable on PPI. Trying to eat better.  Has lost 15 pounds.     Osteoporosis- on prolia.  Wt Readings from Last 3 Encounters:  07/23/18 183 lb (83 kg)  01/17/18 185 lb (83.9 kg)  07/12/17 175 lb (79.4 kg)   Past Medical History:  Diagnosis Date  . Arthritis    osteoarthritis  . Barrett's esophagus   . Cataract    bilateral cateracts removed  . Chronic kidney disease    kidney stones  . Chronic obstructive bronchitis (HCC)    Dr Delford Field  . Colitis   . Colon polyps   . GERD (gastroesophageal reflux disease)   . History of chicken pox   . History of nephrolithiasis   . History of transfusion of packed red blood cells   . Hyperlipidemia   . Hypertension   . IFG (impaired fasting glucose)   . Osteoporosis    pt cannot tolerate fosamax  . 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  [name] Urinary incontinence      Social History   Socioeconomic History  . Marital status: Widowed    Spouse name: Not on file  . Number of children: Not on file  . Years of education: Not on file   . Highest education level: Not on file  Occupational History  . Occupation: Retired  Engineer, production  . Financial resource strain: Not on file  . Food insecurity    Worry: Not on file    Inability: Not on file  . Transportation needs    Medical: Not on file    Non-medical: Not on file  Tobacco Use  . Smoking status: Former Smoker    Years: 15.00    Quit date: 05/15/1967    Years since quitting: 51.7  . Smokeless tobacco: Never Used  Substance and Sexual Activity  . Alcohol use: No  . Drug use: No  . Sexual activity: Never  Lifestyle  . Physical activity    Days per week: Not on file    Minutes per session: Not on file  . Stress: Not on file  Relationships  . Social Musician on phone: Not on file    Gets together: Not on file    Attends religious service: Not on file    Active member of club or organization: Not on file    Attends meetings of clubs or organizations: Not on file    Relationship status: Not on file  . Intimate partner violence    Fear of current or ex partner: Not on file    Emotionally  abused: Not on file    Physically abused: Not on file    Forced sexual activity: Not on file  Other Topics Concern  . Not on file  Social History Narrative   Regular exercise: yes    Past Surgical History:  Procedure Laterality Date  . ABDOMINAL HYSTERECTOMY  1970  . APPENDECTOMY    . CHOLECYSTECTOMY    . LEG SURGERY  2008   right  . TOTAL KNEE ARTHROPLASTY  04/2006   total left knee replacement    Family History  Problem Relation Age of Onset  . Coronary artery disease Mother   . Arthritis Mother   . Tuberculosis Other   . Asthma Maternal Grandmother   . Colon cancer Neg Hx   . Esophageal cancer Neg Hx   . Rectal cancer Neg Hx   . Stomach cancer Neg Hx     Allergies  Allergen Reactions  . Atorvastatin Other (See Comments)    REACTION: MUSCLE PAINS  . Diclofenac-Misoprostol Diarrhea and Nausea And Vomiting  . Fenofibrate Other (See  Comments)    REACTION: carpopedal spasms  . Rosuvastatin Other (See Comments)    REACTION: MUSCLE PAINS  . Statins Other (See Comments)    myalgias  . Amoxicillin-Pot Clavulanate Other (See Comments)    REACTION: unspecified  . Penicillins Hives  . Sulfonamide Derivatives Hives  . Tuberculin Ppd Other (See Comments)    Redness at sight    Current Outpatient Medications on File Prior to Visit  Medication Sig Dispense Refill  . amLODipine (NORVASC) 5 MG tablet TAKE 1 TABLET BY MOUTH ONCE DAILY 90 tablet 1  . Ascorbic Acid (VITAMIN C) 1000 MG tablet Take 1,000 mg by mouth daily.    [name] aspirin 325 MG tablet Take 325 mg by mouth daily.      . beta carotene 15 MG capsule Take 15 mg by mouth daily.      . bifidobacterium infantis (ALIGN) capsule Take 1 capsule by mouth daily.      . Calcium Carbonate-Vitamin D (CALTRATE 600+D) 600-400 MG-UNIT per tablet Take 1 tablet by mouth 2 (two) times daily.    . carboxymethylcellulose (REFRESH PLUS) 0.5 % SOLN Place 1 drop into both eyes 2 (two) times daily.    . Coenzyme Q10 200 MG capsule Take 200 mg by mouth daily.      [name] ibuprofen (ADVIL,MOTRIN) 200 MG tablet Take 800 mg by mouth daily.    . Multiple Vitamin (MULTIVITAMIN) tablet Take 1 tablet by mouth daily.      . niacin 500 MG tablet Take 1,500 mg by mouth at bedtime.    . Omega-3 Fatty Acids (FISH OIL) 1000 MG CAPS Take 1 capsule by mouth every morning.     [name] omeprazole (PRILOSEC) 20 MG capsule TAKE 1 CAPSULE BY MOUTH DAILY, AND THEN 1 CAPSULE AS NEEDED. MAX OF 2 CAPSULES PER DAY 180 capsule 1  . quinapril (ACCUPRIL) 40 MG tablet TAKE 1 TABLET BY MOUTH AT BEDTIME 90 tablet 1  . vitamin E (VITAMIN E) 400 UNIT capsule Take 400 Units by mouth daily.       No current facility-administered medications on file prior to visit.     BP (!) 148/85   Pulse 99   Wt 176 lb (79.8 kg)   LMP 05/14/1968   BMI 32.19 kg/m       Observations/Objective:   Gen: Awake, alert, no acute distress Resp:  Breathing sounds even and non-labored Psych: calm/pleasant demeanor Neuro: Alert and Oriented  x 3, speech sounds clear.   Assessment and Plan:  HTN- bp stable on current meds. Follow up potassium was mildly elevated. D/c kdur. See phone note.   GERD- stable on PPI.  Hyperlipidemia-  Lab Results  Component Value Date   CHOL 223 (H) 01/20/2019   HDL 55.50 01/20/2019   LDLCALC 141 (H) 01/20/2019   LDLDIRECT 158.8 06/10/2008   TRIG 130.0 01/20/2019   CHOLHDL 4 01/20/2019   Follow up lipids are improved on zetia. Continue same.   Follow Up Instructions:    I discussed the assessment and treatment plan with the patient. The patient was provided an opportunity to ask questions and all were answered. The patient agreed with the plan and demonstrated an understanding of the instructions.   The patient was advised to call back or seek an in-person evaluation if the symptoms worsen or if the condition fails to improve as anticipated.  I provided 11 minutes of non-face-to-face time during this encounter.   Lemont FillersMelissa S O'Sullivan, NP

## 2019-01-20 NOTE — Telephone Encounter (Signed)
Also please let hr know that cholesterol is improved with zetia.

## 2019-01-21 ENCOUNTER — Encounter: Payer: Self-pay | Admitting: Family

## 2019-01-21 NOTE — Telephone Encounter (Signed)
LM requesting call back to discuss lab results and medication change.

## 2019-01-27 NOTE — Telephone Encounter (Signed)
Patient notified.  Lab appt for next Tuesday to recheck.

## 2019-01-27 NOTE — Telephone Encounter (Signed)
Could you please call her again?

## 2019-01-27 NOTE — Telephone Encounter (Signed)
Left message on machine to call back  

## 2019-02-02 MED FILL — QUINAPRIL 40 MG TABLET: 40 | 90 days supply | Qty: 90 | Fill #1

## 2019-02-03 ENCOUNTER — Other Ambulatory Visit (INDEPENDENT_AMBULATORY_CARE_PROVIDER_SITE_OTHER): Payer: PPO

## 2019-02-03 ENCOUNTER — Other Ambulatory Visit: Payer: Self-pay

## 2019-02-03 DIAGNOSIS — E876 Hypokalemia: Secondary | ICD-10-CM

## 2019-02-03 LAB — BASIC METABOLIC PANEL
BUN: 9 mg/dL (ref 6–23)
CO2: 24 mEq/L (ref 19–32)
Calcium: 8.7 mg/dL (ref 8.4–10.5)
Chloride: 101 mEq/L (ref 96–112)
Creatinine, Ser: 0.67 mg/dL (ref 0.40–1.20)
GFR: 85.17 mL/min (ref >60.00)
Glucose, Bld: 96 mg/dL (ref 70–99)
Potassium: 3.8 mEq/L (ref 3.5–5.1)
Sodium: 134 mEq/L — ABNORMAL LOW (ref 135–145)

## 2019-03-03 MED FILL — AMLODIPINE BESYLATE 5 MG TA: 5 | 90 days supply | Qty: 90 | Fill #1

## 2019-04-07 ENCOUNTER — Other Ambulatory Visit: Payer: Self-pay | Admitting: Family

## 2019-04-08 ENCOUNTER — Other Ambulatory Visit: Payer: Self-pay

## 2019-04-20 MED FILL — EZETIMIBE 10 MG TABS: 10 | 90 days supply | Qty: 90 | Fill #1

## 2019-05-04 MED FILL — QUINAPRIL 40 MG TABLET: 40 | 60 days supply | Qty: 60 | Fill #2

## 2019-06-02 ENCOUNTER — Other Ambulatory Visit: Payer: Self-pay | Admitting: Family

## 2019-06-02 MED FILL — AMLODIPINE BESYLATE 5 MG TA: 5 | 90 days supply | Qty: 90 | Fill #0

## 2019-06-03 ENCOUNTER — Ambulatory Visit: Payer: Self-pay | Admitting: Family

## 2019-06-03 NOTE — Telephone Encounter (Signed)
Pt reports stepped down from a step and heard "A pop",  left foot. States painful and unable to bear weight. Pt states she is in her car, has not seen injury but "Pretty certain it's swelling." Has not applied ice, states "I figured I'd stay in car in case I need an X-Ray." Advised UC for Xray. Pt states will follow disposition.  Reason for Disposition . Can't stand (bear weight) or walk  Answer Assessment - Initial Assessment Questions 1. MECHANISM: "How did the injury happen?" (e.g., twisting injury, direct blow)    "Just stepped down from a step" 2. ONSET: "When did the injury happen?" (Minutes or hours ago)     30 minutes ago 3. LOCATION: "Where is the injury located?"      Out aspect left foot "Bone that runs along outside of foot." 4. APPEARANCE of INJURY: "What does the injury look like?"     "Not sure, in my car.I'm sure it's swelling" 5. WEIGHT-BEARING: "Can you put weight on that foot?" "Can you walk (four steps or more)?"       no 6. SIZE: For cuts, bruises, or swelling, ask: "How large is it?" (e.g., inches or centimeters;  entire joint)      unsure 7. PAIN: "Is there pain?" If so, ask: "How bad is the pain?"    (e.g., Scale 1-10; or mild, moderate, severe)     Moderate-severe 8. TETANUS: For any breaks in the skin, ask: "When was the last tetanus booster?"     NA 9. OTHER SYMPTOMS: "Do you have any other symptoms?"     no  Protocols used: FOOT AND ANKLE INJURY-A-AH

## 2019-06-04 ENCOUNTER — Other Ambulatory Visit: Payer: Self-pay

## 2019-06-04 ENCOUNTER — Ambulatory Visit (INDEPENDENT_AMBULATORY_CARE_PROVIDER_SITE_OTHER): Payer: PPO | Admitting: Medical

## 2019-06-04 ENCOUNTER — Ambulatory Visit (HOSPITAL_BASED_OUTPATIENT_CLINIC_OR_DEPARTMENT_OTHER)
Admission: RE | Admit: 2019-06-04 | Discharge: 2019-06-04 | Disposition: A | Discharge status: Home / Self Care | Payer: PPO | Source: Ambulatory Visit | Attending: Medical | Admitting: Medical

## 2019-06-04 ENCOUNTER — Encounter: Payer: Self-pay | Admitting: Medical

## 2019-06-04 VITALS — BP 150/85 | HR 84 | Temp 97.1°F | Resp 16 | Ht 62.0 in | Wt 186.8 lb

## 2019-06-04 DIAGNOSIS — M79672 Pain in left foot: Secondary | ICD-10-CM | POA: Diagnosis not present

## 2019-06-04 DIAGNOSIS — M25572 Pain in left ankle and joints of left foot: Secondary | ICD-10-CM | POA: Insufficient documentation

## 2019-06-04 DIAGNOSIS — M7660 Achilles tendinitis, unspecified leg: Secondary | ICD-10-CM

## 2019-06-04 MED ORDER — HYDROCODONE-ACETAMINOPHEN 5-325 MG PO TABS: 1.0000 | ORAL_TABLET | Freq: Four times a day (QID) | ORAL | 0 refills | Status: DC | PRN

## 2019-06-04 MED FILL — HYDROCODON-APAP 5-325: 5-325 | 1 days supply | Qty: 4 | Fill #0

## 2019-06-04 NOTE — Progress Notes (Signed)
   Subjective:    Patient ID: [name], female    DOB: Mar 21, 1941, 79 y.o.   MRN: [id number]  HPI Pt in with left foot pain. She states had pain that was minimal for about 4-5 hours. Then she was walking and felt snap sensation.Pain mostly in mid 5th metatarsal, lateral heel and achilles tendon area.  Some medial ankle pain today.     Review of Systems  Constitutional: Negative for chills, fatigue and fever.  Respiratory: Negative for cough, chest tightness and wheezing.   Cardiovascular: Negative for chest pain and palpitations.  Musculoskeletal:       Left foot and heel pain.       Objective:   Physical Exam  General- No acute distress. Pleasant patient. Neck- Full range of motion, no jvd Lungs- Clear, even and unlabored. Heart- regular rate and rhythm. Neurologic- CNII- XII grossly intact.  Left lower ext- 5th metatarsal mid aspect moderate pain. No bruise. No swelling. Lateral aspect of heel faint tender. Achilles tendon mild tender to palpation.  Ankle- no swelling. Medial ankle tenderness      Assessment & Plan:  For your foot and ankle pain will get xrays. Can use low dose ibuprofen and if need for sever pain limited norco rx given.  Ace wrap given and recommend wrapping after xray today.  If fx refer to sports med. If xray neg but signs/symptoms persist then will refer to sports med later as well  Follow up in 7 days or as needed  [employer], [employer]

## 2019-06-04 NOTE — Patient Instructions (Signed)
For your foot and ankle pain will get xrays. Can use low dose ibuprofen and if need for sever pain limited norco rx given.  Ace wrap given and recommend wrapping after xray today.  If fx refer to sports med. If xray neg but signs/symptoms persist then will refer to sports med later as well  Follow up in 7 days or as needed

## 2019-06-04 NOTE — Telephone Encounter (Signed)
fyi patient going to Urgent care

## 2019-06-10 ENCOUNTER — Ambulatory Visit (INDEPENDENT_AMBULATORY_CARE_PROVIDER_SITE_OTHER): Payer: PPO

## 2019-06-10 ENCOUNTER — Other Ambulatory Visit: Payer: Self-pay

## 2019-06-23 ENCOUNTER — Telehealth: Payer: Self-pay | Admitting: Family

## 2019-06-23 NOTE — Progress Notes (Signed)
°  Chronic Care Management   Outreach Note  06/23/2019 Name: MANMEET ARZOLA MRN: [id number] DOB: Oct 11, 1940  Referred by: Sandford Craze, NP Reason for referral : No chief complaint on file.   An unsuccessful telephone outreach was attempted today. The patient was referred to the pharmacist for assistance with care management and care coordination.   Follow Up Plan:   Raynicia Dukes [employer] Scheduler

## 2019-07-06 ENCOUNTER — Other Ambulatory Visit: Payer: Self-pay | Admitting: Family

## 2019-07-07 ENCOUNTER — Telehealth: Payer: Self-pay | Admitting: Family

## 2019-07-07 MED FILL — QUINAPRIL 40 MG TABLET: 40 | 90 days supply | Qty: 90 | Fill #0

## 2019-07-07 NOTE — Progress Notes (Signed)
  Chronic Care Management   Outreach Note  07/07/2019 Name: SHONDREA STEINERT MRN: [id number] DOB: 1940/06/01  Referred by: Sandford Craze, NP Reason for referral : No chief complaint on file.   A second unsuccessful telephone outreach was attempted today. The patient was referred to pharmacist for assistance with care management and care coordination.  Follow Up Plan:   Raynicia Dukes [employer] Scheduler

## 2019-07-14 ENCOUNTER — Other Ambulatory Visit: Payer: Self-pay | Admitting: Family

## 2019-07-14 MED FILL — EZETIMIBE 10 MG TABS: 10 | 90 days supply | Qty: 90 | Fill #0

## 2019-07-21 ENCOUNTER — Ambulatory Visit (INDEPENDENT_AMBULATORY_CARE_PROVIDER_SITE_OTHER): Payer: PPO | Admitting: Family

## 2019-07-21 ENCOUNTER — Other Ambulatory Visit: Payer: Self-pay

## 2019-07-21 ENCOUNTER — Encounter: Payer: Self-pay | Admitting: Family

## 2019-07-21 VITALS — BP 135/64 | HR 89 | Temp 97.2°F | Resp 16 | Ht 62.0 in | Wt 190.0 lb

## 2019-07-21 DIAGNOSIS — R739 Hyperglycemia, unspecified: Secondary | ICD-10-CM | POA: Diagnosis not present

## 2019-07-21 DIAGNOSIS — E785 Hyperlipidemia, unspecified: Secondary | ICD-10-CM

## 2019-07-21 DIAGNOSIS — M810 Age-related osteoporosis without current pathological fracture: Secondary | ICD-10-CM

## 2019-07-21 DIAGNOSIS — I10 Essential (primary) hypertension: Secondary | ICD-10-CM | POA: Diagnosis not present

## 2019-07-21 LAB — HEMOGLOBIN A1C: Hgb A1c MFr Bld: 5.6 % (ref 4.6–6.5)

## 2019-07-21 MED ORDER — DENOSUMAB 60 MG/ML ~~LOC~~ SOSY
60.0000 mg | PREFILLED_SYRINGE | Freq: Once | SUBCUTANEOUS | Status: AC
  Administered 2019-07-21: 60 mg via SUBCUTANEOUS

## 2019-07-21 NOTE — Patient Instructions (Signed)
Please complete lab work prior to leaving.   

## 2019-07-21 NOTE — Progress Notes (Signed)
Subjective:    Patient ID: [name], female    DOB: Sep 05, 1940, 79 y.o.   MRN: [id number]  HPI  Patient is a 79 yr old female who presents today for follow up.  HTN- on amlodipine 5 mg, accupril 40mg . She denies LE edema. Wt Readings from Last 3 Encounters:  07/21/19 190 lb (86.2 kg)  06/04/19 186 lb 12.8 oz (84.7 kg)  01/21/19 176 lb (79.8 kg)    BP Readings from Last 3 Encounters:  07/21/19 135/64  06/04/19 (!) 150/85  01/21/19 (!) 148/85   Hyperlipidemia- maintained on zetia/niacin.  Lab Results  Component Value Date   CHOL 223 (H) 01/20/2019   HDL 55.50 01/20/2019   LDLCALC 141 (H) 01/20/2019   LDLDIRECT 158.8 06/10/2008   TRIG 130.0 01/20/2019   CHOLHDL 4 01/20/2019   Osteoporosis-on caltrate.  Intolerant to fosamax. On prolia. Last dose was 11/11/2018.   Lab Results  Component Value Date   HGBA1C 5.5 01/17/2018   HGBA1C 5.5 07/12/2017   HGBA1C 5.6 09/24/2016   Lab Results  Component Value Date   LDLCALC 141 (H) 01/20/2019   CREATININE 0.67 02/03/2019      Review of Systems See HPI  Past Medical History:  Diagnosis Date  . Arthritis    osteoarthritis  . Barrett's esophagus   . Cataract    bilateral cateracts removed  . Chronic kidney disease    kidney stones  . Chronic obstructive bronchitis (HCC)    Dr 02/05/2019  . Colitis   . Colon polyps   . GERD (gastroesophageal reflux disease)   . History of chicken pox   . History of nephrolithiasis   . History of transfusion of packed red blood cells   . Hyperlipidemia   . Hypertension   . IFG (impaired fasting glucose)   . Osteoporosis    pt cannot tolerate fosamax  . 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  Delford Field Urinary incontinence      Social History   Socioeconomic History  . Marital status: Widowed    Spouse name: Not on file  . Number of children: Not on file  . Years of education: Not on file  . Highest education level: Not on file    Occupational History  . Occupation: Retired  Tobacco Use  . Smoking status: Former Smoker    Years: 15.00    Quit date: 05/15/1967    Years since quitting: 52.2  . Smokeless tobacco: Never Used  Substance and Sexual Activity  . Alcohol use: No  . Drug use: No  . Sexual activity: Never  Other Topics Concern  . Not on file  Social History Narrative   Regular exercise: yes   Social Determinants of Health   Financial Resource Strain:   . Difficulty of Paying Living Expenses: Not on file  Food Insecurity:   . Worried About 07/13/1967 in the Last Year: Not on file  . Ran Out of Food in the Last Year: Not on file  Transportation Needs:   . Lack of Transportation (Medical): Not on file  . Lack of Transportation (Non-Medical): Not on file  Physical Activity:   . Days of Exercise per Week: Not on file  . Minutes of Exercise per Session: Not on file  Stress:   . Feeling of Stress : Not on file  Social Connections:   . Frequency of Communication with Friends and Family: Not on file  .  Frequency of Social Gatherings with Friends and Family: Not on file  . Attends Religious Services: Not on file  . Active Member of Clubs or Organizations: Not on file  . Attends Archivist Meetings: Not on file  . Marital Status: Not on file  Intimate Partner Violence:   . Fear of Current or Ex-Partner: Not on file  . Emotionally Abused: Not on file  . Physically Abused: Not on file  . Sexually Abused: Not on file    Past Surgical History:  Procedure Laterality Date  . ABDOMINAL HYSTERECTOMY  1970  . APPENDECTOMY    . CHOLECYSTECTOMY    . LEG SURGERY  2008   right  . TOTAL KNEE ARTHROPLASTY  04/2006   total left knee replacement    Family History  Problem Relation Age of Onset  . Coronary artery disease Mother   . Arthritis Mother   . Tuberculosis Other   . Asthma Maternal Grandmother   . Colon cancer Neg Hx   . Esophageal cancer Neg Hx   . Rectal cancer Neg Hx    . Stomach cancer Neg Hx     Allergies  Allergen Reactions  . Atorvastatin Other (See Comments)    REACTION: MUSCLE PAINS  . Diclofenac-Misoprostol Diarrhea and Nausea And Vomiting  . Fenofibrate Other (See Comments)    REACTION: carpopedal spasms  . Rosuvastatin Other (See Comments)    REACTION: MUSCLE PAINS  . Statins Other (See Comments)    myalgias  . Amoxicillin-Pot Clavulanate Other (See Comments)    REACTION: unspecified  . Penicillins Hives  . Sulfonamide Derivatives Hives  . Tuberculin Ppd Other (See Comments)    Redness at sight    Current Outpatient Medications on File Prior to Visit  Medication Sig Dispense Refill  . amLODipine (NORVASC) 5 MG tablet TAKE 1 TABLET BY MOUTH ONCE DAILY 90 tablet 1  . Ascorbic Acid (VITAMIN C) 1000 MG tablet Take 1,000 mg by mouth daily.    [name] aspirin 325 MG tablet Take 325 mg by mouth daily.      . beta carotene 15 MG capsule Take 15 mg by mouth daily.      . bifidobacterium infantis (ALIGN) capsule Take 1 capsule by mouth daily.      . Calcium Carbonate-Vitamin D (CALTRATE 600+D) 600-400 MG-UNIT per tablet Take 1 tablet by mouth 2 (two) times daily.    . carboxymethylcellulose (REFRESH PLUS) 0.5 % SOLN Place 1 drop into both eyes 2 (two) times daily.    . Coenzyme Q10 200 MG capsule Take 200 mg by mouth daily.      [name] ezetimibe (ZETIA) 10 MG tablet TAKE 1 TABLET BY MOUTH ONCE DAILY 90 tablet 1  . HYDROcodone-acetaminophen (NORCO) 5-325 MG tablet Take 1 tablet by mouth every 6 (six) hours as needed for moderate pain. 4 tablet 0  . ibuprofen (ADVIL,MOTRIN) 200 MG tablet Take 800 mg by mouth daily.    . Multiple Vitamin (MULTIVITAMIN) tablet Take 1 tablet by mouth daily.      . niacin 500 MG tablet Take 1,500 mg by mouth at bedtime.    . Omega-3 Fatty Acids (FISH OIL) 1000 MG CAPS Take 1 capsule by mouth every morning.     [name] omeprazole (PRILOSEC) 20 MG capsule TAKE 1 CAPSULE BY MOUTH ONCE DAILY, AND 1 CAPSULE AS NEEDED. MAX OF 2 CAPSULES  PER DAY 180 capsule 1  . quinapril (ACCUPRIL) 40 MG tablet TAKE 1 TABLET BY MOUTH AT BEDTIME 90 tablet 0  .  vitamin E (VITAMIN E) 400 UNIT capsule Take 400 Units by mouth daily.       No current facility-administered medications on file prior to visit.    BP 135/64 (BP Location: Right Arm, Patient Position: Sitting, Cuff Size: Large)   Pulse 89   Temp (!) 97.2 F (36.2 C) (Temporal)   Resp 16   Ht 5\' 2"  (1.575 m)   Wt 190 lb (86.2 kg)   LMP 05/14/1968   SpO2 98%   BMI 34.75 kg/m       Objective:   Physical Exam Constitutional:      Appearance: She is well-developed.  Cardiovascular:     Rate and Rhythm: Normal rate and regular rhythm.     Heart sounds: Normal heart sounds. No murmur.  Pulmonary:     Effort: Pulmonary effort is normal. No respiratory distress.     Breath sounds: Normal breath sounds. No wheezing.  Psychiatric:        Behavior: Behavior normal.        Thought Content: Thought content normal.        Judgment: Judgment normal.           Assessment & Plan:  HTN- bp at goal for her age.  Continue current meds/doses.   Hyperlipidemia- maintained on zetia/niacin. Continue same.   Osteoporosis- prolia injection given today. Continue caltrate bid.   Hyperglycemia- obtain A1C.   This visit occurred during the SARS-CoV-2 public health emergency.  Safety protocols were in place, including screening questions prior to the visit, additional usage of staff PPE, and extensive cleaning of exam room while observing appropriate contact time as indicated for disinfecting solutions.

## 2019-07-22 ENCOUNTER — Encounter: Payer: Self-pay | Admitting: Family

## 2019-07-22 NOTE — Progress Notes (Signed)
Mailed

## 2019-08-26 NOTE — Progress Notes (Signed)
Nurse connected with patient 08/27/19 at  2:30 PM EDT by a telephone enabled telemedicine application and verified that I am speaking with the correct person using two identifiers. Patient stated full name and DOB. Patient gave permission to continue with virtual visit. Patient's location was at home and Nurse's location was at [place] office.   Subjective:   [name] is a 79 y.o. female who presents for Medicare Annual (Subsequent) preventive examination.  Enjoys church on Sundays and Wednesday.  Review of Systems:  Home Safety/Smoke Alarms: Feels safe in home. Smoke alarms in place.  Lives alone. 1 story home. Has a emergency alert necklace and bracelet. Has a friend she talks to daily.   Female:   Mammo-07/23/18. Pt states she will do in September.     Dexa scan-  07/23/18      CCS- No longer doing routine screening due to age.     Objective:     Vitals: Unable to assess. This visit is enabled though telemedicine due to Covid 19.    Advanced Directives 08/27/2019 09/24/2016 12/14/2014 02/10/2014  Does Patient Have a Medical Advance Directive? No Yes Yes No  Type of Advance Directive - Healthcare Power of [place];Living will Healthcare Power of Attorney -  Does patient want to make changes to medical advance directive? - No - Patient declined No - Patient declined -  Copy of Healthcare Power of Attorney in Chart? - No - copy requested No - copy requested -  Would patient like information on creating a medical advance directive? No - Patient declined - - No - patient declined information    Tobacco Social History   Tobacco Use  Smoking Status Former Smoker  . Years: 15.00  . Quit date: 05/15/1967  . Years since quitting: 52.3  Smokeless Tobacco Never Used     Counseling given: Not Answered   Clinical Intake: Pain : No/denies pain     Past Medical History:  Diagnosis Date  . Arthritis    osteoarthritis  . Barrett's esophagus   . Cataract    bilateral  cateracts removed  . Chronic kidney disease    kidney stones  . Chronic obstructive bronchitis (HCC)    Dr Delford Field  . Colitis   . Colon polyps   . GERD (gastroesophageal reflux disease)   . History of chicken pox   . History of nephrolithiasis   . History of transfusion of packed red blood cells   . Hyperlipidemia   . Hypertension   . IFG (impaired fasting glucose)   . Osteoporosis    pt cannot tolerate fosamax  . 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  [name] Urinary incontinence    Past Surgical History:  Procedure Laterality Date  . ABDOMINAL HYSTERECTOMY  1970  . APPENDECTOMY    . CHOLECYSTECTOMY    . LEG SURGERY  2008   right  . TOTAL KNEE ARTHROPLASTY  04/2006   total left knee replacement   Family History  Problem Relation Age of Onset  . Coronary artery disease Mother   . Arthritis Mother   . Tuberculosis Other   . Asthma Maternal Grandmother   . Colon cancer Neg Hx   . Esophageal cancer Neg Hx   . Rectal cancer Neg Hx   . Stomach cancer Neg Hx    Social History   Socioeconomic History  . Marital status: Widowed    Spouse name: Not on file  . Number of  children: Not on file  . Years of education: Not on file  . Highest education level: Not on file  Occupational History  . Occupation: Retired  Tobacco Use  . Smoking status: Former Smoker    Years: 15.00    Quit date: 05/15/1967    Years since quitting: 52.3  . Smokeless tobacco: Never Used  Substance and Sexual Activity  . Alcohol use: No  . Drug use: No  . Sexual activity: Never  Other Topics Concern  . Not on file  Social History Narrative   Regular exercise: yes   Social Determinants of Health   Financial Resource Strain: Low Risk   . Difficulty of Paying Living Expenses: Not hard at all  Food Insecurity: No Food Insecurity  . Worried About Programme researcher, broadcasting/film/video in the Last Year: Never true  . Ran Out of Food in the Last Year: Never true    Transportation Needs: No Transportation Needs  . Lack of Transportation (Medical): No  . Lack of Transportation (Non-Medical): No  Physical Activity:   . Days of Exercise per Week:   . Minutes of Exercise per Session:   Stress:   . Feeling of Stress :   Social Connections:   . Frequency of Communication with Friends and Family:   . Frequency of Social Gatherings with Friends and Family:   . Attends Religious Services:   . Active Member of Clubs or Organizations:   . Attends Banker Meetings:   [name] Marital Status:     Outpatient Encounter Medications as of 08/27/2019  Medication Sig  . amLODipine (NORVASC) 5 MG tablet TAKE 1 TABLET BY MOUTH ONCE DAILY  . Ascorbic Acid (VITAMIN C) 1000 MG tablet Take 1,000 mg by mouth daily.  [name] aspirin 325 MG tablet Take 325 mg by mouth daily.    . beta carotene 15 MG capsule Take 15 mg by mouth daily.    . bifidobacterium infantis (ALIGN) capsule Take 1 capsule by mouth daily.    . Calcium Carbonate-Vitamin D (CALTRATE 600+D) 600-400 MG-UNIT per tablet Take 1 tablet by mouth 2 (two) times daily.  . carboxymethylcellulose (REFRESH PLUS) 0.5 % SOLN Place 1 drop into both eyes 2 (two) times daily.  . Coenzyme Q10 200 MG capsule Take 200 mg by mouth daily.    [name] ezetimibe (ZETIA) 10 MG tablet TAKE 1 TABLET BY MOUTH ONCE DAILY  . HYDROcodone-acetaminophen (NORCO) 5-325 MG tablet Take 1 tablet by mouth every 6 (six) hours as needed for moderate pain.  [name] ibuprofen (ADVIL,MOTRIN) 200 MG tablet Take 800 mg by mouth daily.  . Multiple Vitamin (MULTIVITAMIN) tablet Take 1 tablet by mouth daily.    . niacin 500 MG tablet Take 1,500 mg by mouth at bedtime.  . Omega-3 Fatty Acids (FISH OIL) 1000 MG CAPS Take 1 capsule by mouth every morning.   [name] omeprazole (PRILOSEC) 20 MG capsule TAKE 1 CAPSULE BY MOUTH ONCE DAILY, AND 1 CAPSULE AS NEEDED. MAX OF 2 CAPSULES PER DAY  . quinapril (ACCUPRIL) 40 MG tablet TAKE 1 TABLET BY MOUTH AT BEDTIME  . vitamin E  (VITAMIN E) 400 UNIT capsule Take 400 Units by mouth daily.     No facility-administered encounter medications on file as of 08/27/2019.    Activities of Daily Living In your present state of health, do you have any difficulty performing the following activities: 08/27/2019  Hearing? N  Vision? N  Difficulty concentrating or making decisions? N  Walking or climbing stairs? N  Dressing or bathing? N  Doing errands, shopping? N  Preparing Food and eating ? N  Using the Toilet? N  In the past six months, have you accidently leaked urine? N  Do you have problems with loss of bowel control? N  Managing your Medications? N  Managing your Finances? N  Housekeeping or managing your Housekeeping? N  Some recent data might be hidden    Patient Care Team: [name], NP as PCP - General Olevia Perches Lowella Bandy, MD (Inactive) as Consulting Physician (Gastroenterology) Suella Broad, MD as Consulting Physician (Physical Medicine and Rehabilitation)    Assessment:   This is a routine wellness examination for [name]. Physical assessment deferred to PCP.  Exercise Activities and Dietary recommendations Current Exercise Habits: The patient does not participate in regular exercise at present, Exercise limited by: None identified Diet (meal preparation, eat out, water intake, caffeinated beverages, dairy products, fruits and vegetables): well balanced   Goals    . Increase physical activity     Use 5 min rule.      . Weight (lb) < 150 lb (68 kg)       Fall Risk Fall Risk  08/27/2019 04/08/2019 01/17/2018 09/24/2016 09/20/2015  Falls in the past year? 0 1 No No No  Comment - Emmi Telephone Survey: data to providers prior to load - - -  Number falls in past yr: 0 1 - - -  Comment - Emmi Telephone Survey Actual Response = 2 - - -  Injury with Fall? 0 0 - - -  Follow up Education provided;Falls prevention discussed - - - -   Depression Screen PHQ 2/9 Scores 08/27/2019 06/04/2019 01/17/2018  09/24/2016  PHQ - 2 Score 0 0 0 0  PHQ- 9 Score - 0 2 2     Cognitive Function Ad8 score reviewed for issues:  Issues making decisions:no  Less interest in hobbies / activities:no  Repeats questions, stories (family complaining):no  Trouble using ordinary gadgets (microwave, computer, phone):no  Forgets the month or year: no  Mismanaging finances: no  Remembering appts:no  Daily problems with thinking and/or memory:no Ad8 score is=0  MMSE - Mini Mental State Exam 09/24/2016  Orientation to time 5  Orientation to Place 5  Registration 3  Attention/ Calculation 5  Recall 2  Language- name 2 objects 2  Language- repeat 1  Language- follow 3 step command 3  Language- read & follow direction 1  Write a sentence 1  Copy design 1  Total score 29        Immunization History  Administered Date(s) Administered  . Fluad Quad(high Dose 65+) 02/25/2019  . Influenza Split 04/18/2011, 02/08/2012  . Influenza Whole 04/01/2007, 02/02/2008, 01/31/2009, 02/20/2010  . Influenza, High Dose Seasonal PF 04/01/2018  . Influenza,inj,Quad PF,6+ Mos 02/11/2014, 01/28/2015, 03/27/2016  . Influenza-Unspecified 03/14/2013, 03/05/2017  . PFIZER SARS-COV-2 Vaccination 06/03/2019, 06/24/2019  . Pneumococcal Conjugate-13 03/23/2015  . Pneumococcal Polysaccharide-23 07/24/2006, 02/25/2019  . Td 03/19/2002, 05/14/2006  . Zoster Recombinat (Shingrix) 04/02/2017, 06/02/2017    Screening Tests Health Maintenance  Topic Date Due  . FOOT EXAM  Never done  . OPHTHALMOLOGY EXAM  Never done  . TETANUS/TDAP  05/14/2016  . INFLUENZA VACCINE  12/13/2019  . HEMOGLOBIN A1C  01/21/2020  . DEXA SCAN  Completed  . PNA vac Low Risk Adult  Completed      Plan:    Please schedule your next medicare wellness visit with me in 1 yr.  Continue to eat heart healthy diet (  full of fruits, vegetables, whole grains, lean protein, water--limit salt, fat, and sugar intake) and increase physical activity as  tolerated.  Continue doing brain stimulating activities (puzzles, reading, adult coloring books, staying active) to keep memory sharp.    I have personally reviewed and noted the following in the patient's chart:   . Medical and social history . Use of alcohol, tobacco or illicit drugs  . Current medications and supplements . Functional ability and status . Nutritional status . Physical activity . Advanced directives . List of other physicians . Hospitalizations, surgeries, and ER visits in previous 12 months . Vitals . Screenings to include cognitive, depression, and falls . Referrals and appointments  In addition, I have reviewed and discussed with patient certain preventive protocols, quality metrics, and best practice recommendations. A written personalized care plan for preventive services as well as general preventive health recommendations were provided to patient.     [place]on [place], [place]  08/27/2019

## 2019-08-27 ENCOUNTER — Ambulatory Visit (INDEPENDENT_AMBULATORY_CARE_PROVIDER_SITE_OTHER): Payer: PPO | Admitting: *Deleted

## 2019-08-27 ENCOUNTER — Encounter: Payer: Self-pay | Admitting: *Deleted

## 2019-08-27 ENCOUNTER — Other Ambulatory Visit: Payer: Self-pay

## 2019-08-27 DIAGNOSIS — Z0000 Encounter for general adult medical examination without abnormal findings: Secondary | ICD-10-CM

## 2019-08-27 NOTE — Patient Instructions (Signed)
Please schedule your next medicare wellness visit with me in 1 yr.  Continue to eat heart healthy diet (full of fruits, vegetables, whole grains, lean protein, water--limit salt, fat, and sugar intake) and increase physical activity as tolerated.  Continue doing brain stimulating activities (puzzles, reading, adult coloring books, staying active) to keep memory sharp.    [name] , Thank you for taking time to come for your Medicare Wellness Visit. I appreciate your ongoing commitment to your health goals. Please review the following plan we discussed and let me know if I can assist you in the future.   These are the goals we discussed: Goals    . Increase physical activity     Use 5 min rule.      . Weight (lb) < 150 lb (68 kg)       This is a list of the screening recommended for you and due dates:  Health Maintenance  Topic Date Due  . Complete foot exam   Never done  . Eye exam for diabetics  Never done  . Tetanus Vaccine  05/14/2016  . Flu Shot  12/13/2019  . Hemoglobin A1C  01/21/2020  . DEXA scan (bone density measurement)  Completed  . Pneumonia vaccines  Completed    Preventive Care 2 Years and Older, Female Preventive care refers to lifestyle choices and visits with your health care provider that can promote health and wellness. This includes:  A yearly physical exam. This is also called an annual well check.  Regular dental and eye exams.  Immunizations.  Screening for certain conditions.  Healthy lifestyle choices, such as diet and exercise. What can I expect for my preventive care visit? Physical exam Your health care provider will check:  Height and weight. These may be used to calculate body mass index (BMI), which is a measurement that tells if you are at a healthy weight.  Heart rate and blood pressure.  Your skin for abnormal spots. Counseling Your health care provider may ask you questions about:  Alcohol, tobacco, and drug  use.  Emotional well-being.  Home and relationship well-being.  Sexual activity.  Eating habits.  History of falls.  Memory and ability to understand (cognition).  Work and work Statistician.  Pregnancy and menstrual history. What immunizations do I need?  Influenza (flu) vaccine  This is recommended every year. Tetanus, diphtheria, and pertussis (Tdap) vaccine  You may need a Td booster every 10 years. Varicella (chickenpox) vaccine  You may need this vaccine if you have not already been vaccinated. Zoster (shingles) vaccine  You may need this after age 63. Pneumococcal conjugate (PCV13) vaccine  One dose is recommended after age 71. Pneumococcal polysaccharide (PPSV23) vaccine  One dose is recommended after age 54. Measles, mumps, and rubella (MMR) vaccine  You may need at least one dose of MMR if you were born in 1957 or later. You may also need a second dose. Meningococcal conjugate (MenACWY) vaccine  You may need this if you have certain conditions. Hepatitis A vaccine  You may need this if you have certain conditions or if you travel or work in places where you may be exposed to hepatitis A. Hepatitis B vaccine  You may need this if you have certain conditions or if you travel or work in places where you may be exposed to hepatitis B. Haemophilus influenzae type b (Hib) vaccine  You may need this if you have certain conditions. You may receive vaccines as individual doses or as  more than one vaccine together in one shot (combination vaccines). Talk with your health care provider about the risks and benefits of combination vaccines. What tests do I need? Blood tests  Lipid and cholesterol levels. These may be checked every 5 years, or more frequently depending on your overall health.  Hepatitis C test.  Hepatitis B test. Screening  Lung cancer screening. You may have this screening every year starting at age 32 if you have a 30-pack-year history of  smoking and currently smoke or have quit within the past 15 years.  Colorectal cancer screening. All adults should have this screening starting at age 73 and continuing until age 78. Your health care provider may recommend screening at age 62 if you are at increased risk. You will have tests every 1-10 years, depending on your results and the type of screening test.  Diabetes screening. This is done by checking your blood sugar (glucose) after you have not eaten for a while (fasting). You may have this done every 1-3 years.  Mammogram. This may be done every 1-2 years. Talk with your health care provider about how often you should have regular mammograms.  BRCA-related cancer screening. This may be done if you have a family history of breast, ovarian, tubal, or peritoneal cancers. Other tests  Sexually transmitted disease (STD) testing.  Bone density scan. This is done to screen for osteoporosis. You may have this done starting at age 41. Follow these instructions at home: Eating and drinking  Eat a diet that includes fresh fruits and vegetables, whole grains, lean protein, and low-fat dairy products. Limit your intake of foods with high amounts of sugar, saturated fats, and salt.  Take vitamin and mineral supplements as recommended by your health care provider.  Do not drink alcohol if your health care provider tells you not to drink.  If you drink alcohol: ? Limit how much you have to 0-1 drink a day. ? Be aware of how much alcohol is in your drink. In the [place] one drink equals one 12 oz bottle of beer (355 mL), one 5 oz glass of wine (148 mL), or one 1 oz glass of hard liquor (44 mL). Lifestyle  Take daily care of your teeth and gums.  Stay active. Exercise for at least 30 minutes on 5 or more days each week.  Do not use any products that contain nicotine or tobacco, such as cigarettes, e-cigarettes, and chewing tobacco. If you need help quitting, ask your health care  provider.  If you are sexually active, practice safe sex. Use a condom or other form of protection in order to prevent STIs (sexually transmitted infections).  Talk with your health care provider about taking a low-dose aspirin or statin. What's next?  Go to your health care provider once a year for a well check visit.  Ask your health care provider how often you should have your eyes and teeth checked.  Stay up to date on all vaccines. This information is not intended to replace advice given to you by your health care provider. Make sure you discuss any questions you have with your health care provider. Document Revised: 04/24/2018 Document Reviewed: 04/24/2018 Elsevier Patient Education  2020 [employer].

## 2019-09-04 MED FILL — AMLODIPINE BESYLATE 5 MG TA: 5 | 90 days supply | Qty: 90 | Fill #1

## 2019-09-28 ENCOUNTER — Other Ambulatory Visit: Payer: Self-pay | Admitting: Family

## 2019-09-28 MED FILL — QUINAPRIL HCL 40 MG TABS: 40 | 90 days supply | Qty: 90 | Fill #0

## 2019-10-07 MED FILL — OMEPRAZOLE 20 MG CAP: 20 | 90 days supply | Qty: 180 | Fill #1

## 2019-11-30 ENCOUNTER — Other Ambulatory Visit: Payer: Self-pay | Admitting: Family

## 2019-11-30 MED FILL — AMLODIPINE BESYLATE 5 MG TA: 5 | 90 days supply | Qty: 90 | Fill #0

## 2019-12-28 ENCOUNTER — Other Ambulatory Visit: Payer: Self-pay | Admitting: Family

## 2019-12-28 MED FILL — QUINAPRIL HCL 40 MG TABS: 40 | 90 days supply | Qty: 90 | Fill #0

## 2020-01-11 ENCOUNTER — Other Ambulatory Visit: Payer: Self-pay | Admitting: Family

## 2020-01-11 MED FILL — EZETIMIBE 10 MG TABS: 10 | 90 days supply | Qty: 90 | Fill #0

## 2020-01-22 ENCOUNTER — Ambulatory Visit: Payer: PPO | Admitting: Family

## 2020-01-28 ENCOUNTER — Telehealth: Payer: Self-pay | Admitting: Family

## 2020-01-28 NOTE — Progress Notes (Signed)
  Chronic Care Management   Outreach Note  01/28/2020 Name: [name] MRN: [id number] DOB: [birth year]  Referred by: Sandford Craze, NP Reason for referral : No chief complaint on file.   An unsuccessful telephone outreach was attempted today. The patient was referred to the pharmacist for assistance with care management and care coordination.   Follow Up Plan:   [name] [employer] Scheduler

## 2020-02-01 ENCOUNTER — Ambulatory Visit (INDEPENDENT_AMBULATORY_CARE_PROVIDER_SITE_OTHER): Payer: PPO | Admitting: Family

## 2020-02-01 ENCOUNTER — Encounter: Payer: Self-pay | Admitting: Family

## 2020-02-01 ENCOUNTER — Other Ambulatory Visit: Payer: Self-pay

## 2020-02-01 VITALS — BP 140/82 | HR 85 | Resp 15 | Ht 62.0 in | Wt 177.0 lb

## 2020-02-01 DIAGNOSIS — I10 Essential (primary) hypertension: Secondary | ICD-10-CM | POA: Diagnosis not present

## 2020-02-01 DIAGNOSIS — Z23 Encounter for immunization: Secondary | ICD-10-CM | POA: Diagnosis not present

## 2020-02-01 DIAGNOSIS — E785 Hyperlipidemia, unspecified: Secondary | ICD-10-CM | POA: Diagnosis not present

## 2020-02-01 DIAGNOSIS — K219 Gastro-esophageal reflux disease without esophagitis: Secondary | ICD-10-CM | POA: Diagnosis not present

## 2020-02-01 DIAGNOSIS — M810 Age-related osteoporosis without current pathological fracture: Secondary | ICD-10-CM | POA: Diagnosis not present

## 2020-02-01 DIAGNOSIS — E1169 Type 2 diabetes mellitus with other specified complication: Secondary | ICD-10-CM

## 2020-02-01 NOTE — Progress Notes (Signed)
Subjective:    Patient ID: [name], female    DOB: 1940/09/24, 79 y.o.   MRN: [id number]  HPI  Patient is a 79 yr old female who presents today for follow up.   HTN- maintained on amlodipine 5mg  and quinipril.   BP Readings from Last 3 Encounters:  02/01/20 140/82  07/21/19 135/64  06/04/19 (!) 150/85   GERD- reports stable on omeprazole.    Osteoarthritis- uses ibuprofen 400mg  bid prn.    She is walking and working on her weight loss. Trying to improve her diet. Uses noom. Wt Readings from Last 3 Encounters:  02/01/20 177 lb (80.3 kg)  07/21/19 190 lb (86.2 kg)  06/04/19 186 lb 12.8 oz (84.7 kg)   Lab Results  Component Value Date   HGBA1C 5.6 07/21/2019   HGBA1C 5.5 01/17/2018   HGBA1C 5.5 07/12/2017   Lab Results  Component Value Date   LDLCALC 141 (H) 01/20/2019   CREATININE 0.67 02/03/2019     Hyperlipidemia- intolerant to statins.  Lab Results  Component Value Date   CHOL 223 (H) 01/20/2019   HDL 55.50 01/20/2019   LDLCALC 141 (H) 01/20/2019   LDLDIRECT 158.8 06/10/2008   TRIG 130.0 01/20/2019   CHOLHDL 4 01/20/2019     Review of Systems See HPI  Past Medical History:  Diagnosis Date   Arthritis    osteoarthritis   Barrett's esophagus    Cataract    bilateral cateracts removed   Chronic kidney disease    kidney stones   Chronic obstructive bronchitis (HCC)    Dr 03/22/2019   Colitis    Colon polyps    GERD (gastroesophageal reflux disease)    History of chicken pox    History of nephrolithiasis    History of transfusion of packed red blood cells    Hyperlipidemia    Hypertension    IFG (impaired fasting glucose)    Osteoporosis    pt cannot tolerate fosamax   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   Urinary incontinence      Social History   Socioeconomic History   Marital status: Widowed    Spouse name: Not on file   Number of children: Not on file   Years  of education: Not on file   Highest education level: Not on file  Occupational History   Occupation: Retired  Tobacco Use   Smoking status: Former Smoker    Years: 15.00    Quit date: 05/15/1967    Years since quitting: 52.7   Smokeless tobacco: Never Used  Substance and Sexual Activity   Alcohol use: No   Drug use: No   Sexual activity: Never  Other Topics Concern   Not on file  Social History Narrative   Regular exercise: yes   Social Determinants of Health   Financial Resource Strain: Low Risk    Difficulty of Paying Living Expenses: Not hard at all  Food Insecurity: No Food Insecurity   Worried About Delford Field in the Last Year: Never true   Ran Out of Food in the Last Year: Never true  Transportation Needs: No Transportation Needs   Lack of Transportation (Medical): No   Lack of Transportation (Non-Medical): No  Physical Activity:    Days of Exercise per Week: Not on file   Minutes of Exercise per Session: Not on file  Stress:    Feeling of Stress : Not on file  Social Connections:    Frequency of Communication with Friends and Family: Not on file   Frequency of Social Gatherings with Friends and Family: Not on file   Attends Religious Services: Not on Scientist, clinical (histocompatibility and immunogenetics) or Organizations: Not on file   Attends Banker Meetings: Not on file   Marital Status: Not on file  Intimate Partner Violence:    Fear of Current or Ex-Partner: Not on file   Emotionally Abused: Not on file   Physically Abused: Not on file   Sexually Abused: Not on file    Past Surgical History:  Procedure Laterality Date   ABDOMINAL HYSTERECTOMY  1970   APPENDECTOMY     CHOLECYSTECTOMY     LEG SURGERY  2008   right   TOTAL KNEE ARTHROPLASTY  04/2006   total left knee replacement    Family History  Problem Relation Age of Onset   Coronary artery disease Mother    Arthritis Mother    Tuberculosis Other    Asthma  Maternal Grandmother    Colon cancer Neg Hx    Esophageal cancer Neg Hx    Rectal cancer Neg Hx    Stomach cancer Neg Hx     Allergies  Allergen Reactions   Atorvastatin Other (See Comments)    REACTION: MUSCLE PAINS   Diclofenac-Misoprostol Diarrhea and Nausea And Vomiting   Fenofibrate Other (See Comments)    REACTION: carpopedal spasms   Rosuvastatin Other (See Comments)    REACTION: MUSCLE PAINS   Statins Other (See Comments)    myalgias   Amoxicillin-Pot Clavulanate Other (See Comments)    REACTION: unspecified   Penicillins Hives   Sulfonamide Derivatives Hives   Tuberculin Ppd Other (See Comments)    Redness at sight    Current Outpatient Medications on File Prior to Visit  Medication Sig Dispense Refill   amLODipine (NORVASC) 5 MG tablet TAKE 1 TABLET BY MOUTH ONCE DAILY 90 tablet 1   Ascorbic Acid (VITAMIN C) 1000 MG tablet Take 1,000 mg by mouth daily.     aspirin 325 MG tablet Take 325 mg by mouth daily.       beta carotene 15 MG capsule Take 15 mg by mouth daily.       bifidobacterium infantis (ALIGN) capsule Take 1 capsule by mouth daily.       Calcium Carbonate-Vitamin D (CALTRATE 600+D) 600-400 MG-UNIT per tablet Take 1 tablet by mouth 2 (two) times daily.     carboxymethylcellulose (REFRESH PLUS) 0.5 % SOLN Place 1 drop into both eyes 2 (two) times daily.     Coenzyme Q10 200 MG capsule Take 200 mg by mouth daily.       ezetimibe (ZETIA) 10 MG tablet TAKE 1 TABLET BY MOUTH ONCE DAILY 90 tablet 1   ibuprofen (ADVIL,MOTRIN) 200 MG tablet Take 800 mg by mouth daily.     Multiple Vitamin (MULTIVITAMIN) tablet Take 1 tablet by mouth daily.       niacin 500 MG tablet Take 1,500 mg by mouth at bedtime.     Omega-3 Fatty Acids (FISH OIL) 1000 MG CAPS Take 1 capsule by mouth every morning.      omeprazole (PRILOSEC) 20 MG capsule TAKE 1 CAPSULE BY MOUTH ONCE DAILY, AND 1 CAPSULE AS NEEDED. MAX OF 2 CAPSULES PER DAY 180 capsule 1   quinapril  (ACCUPRIL) 40 MG tablet TAKE 1 TABLET BY MOUTH AT BEDTIME 90 tablet 0   vitamin E (VITAMIN E) 400  UNIT capsule Take 400 Units by mouth daily.       No current facility-administered medications on file prior to visit.    BP 140/82 (BP Location: Right Arm, Patient Position: Sitting, Cuff Size: Large)    Pulse 85    Resp 15    Ht 5\' 2"  (1.575 m)    Wt 177 lb (80.3 kg)    LMP 05/14/1968    SpO2 98%    BMI 32.37 kg/m       Objective:   Physical Exam Constitutional:      Appearance: She is well-developed.  Cardiovascular:     Rate and Rhythm: Normal rate and regular rhythm.     Heart sounds: Normal heart sounds. No murmur heard.   Pulmonary:     Effort: Pulmonary effort is normal. No respiratory distress.     Breath sounds: Normal breath sounds. No wheezing.  Psychiatric:        Behavior: Behavior normal.        Thought Content: Thought content normal.        Judgment: Judgment normal.           Assessment & Plan:  HTN- bp acceptable for her age.  Continue accupril/amlodipine.  Hyperlipidemia- working hard on diet/exercise and weight loss. Check follow up lipid panel. Continue zetia.  Osteoporosis- would like to complete Prolia another day.    GERD- stable on omeprazole. Continue same.  This visit occurred during the SARS-CoV-2 public health emergency.  Safety protocols were in place, including screening questions prior to the visit, additional usage of staff PPE, and extensive cleaning of exam room while observing appropriate contact time as indicated for disinfecting solutions.

## 2020-02-01 NOTE — Patient Instructions (Addendum)
Please complete lab work prior to leaving.   

## 2020-02-02 ENCOUNTER — Encounter: Payer: Self-pay | Admitting: Family

## 2020-02-02 LAB — COMPREHENSIVE METABOLIC PANEL
AG Ratio: 2 (calc) (ref 1.0–2.5)
ALT: 22 U/L (ref 6–29)
AST: 19 U/L (ref 10–35)
Albumin: 4.3 g/dL (ref 3.6–5.1)
Alkaline phosphatase (APISO): 66 U/L (ref 37–153)
BUN: 10 mg/dL (ref 7–25)
CO2: 27 mmol/L (ref 20–32)
Calcium: 9.9 mg/dL (ref 8.6–10.4)
Chloride: 104 mmol/L (ref 98–110)
Creat: 0.72 mg/dL (ref 0.60–0.93)
Globulin: 2.2 g/dL (calc) (ref 1.9–3.7)
Glucose, Bld: 101 mg/dL — ABNORMAL HIGH (ref 65–99)
Potassium: 5.3 mmol/L (ref 3.5–5.3)
Sodium: 139 mmol/L (ref 135–146)
Total Bilirubin: 0.4 mg/dL (ref 0.2–1.2)
Total Protein: 6.5 g/dL (ref 6.1–8.1)

## 2020-02-02 LAB — LIPID PANEL
Cholesterol: 267 mg/dL — ABNORMAL HIGH (ref <200)
HDL: 59 mg/dL (ref >=50)
LDL Cholesterol (Calc): 170 mg/dL (calc) — ABNORMAL HIGH
Non-HDL Cholesterol (Calc): 208 mg/dL (calc) — ABNORMAL HIGH (ref <130)
Total CHOL/HDL Ratio: 4.5 (calc) (ref <5.0)
Triglycerides: 212 mg/dL — ABNORMAL HIGH (ref <150)

## 2020-02-04 NOTE — Progress Notes (Signed)
Mailed out to patient 

## 2020-02-09 ENCOUNTER — Ambulatory Visit (INDEPENDENT_AMBULATORY_CARE_PROVIDER_SITE_OTHER): Payer: PPO

## 2020-02-09 ENCOUNTER — Other Ambulatory Visit: Payer: Self-pay

## 2020-02-09 DIAGNOSIS — M810 Age-related osteoporosis without current pathological fracture: Secondary | ICD-10-CM | POA: Diagnosis not present

## 2020-02-09 MED ORDER — DENOSUMAB 60 MG/ML ~~LOC~~ SOSY
60.0000 mg | PREFILLED_SYRINGE | Freq: Once | SUBCUTANEOUS | Status: AC
  Administered 2020-02-09: 60 mg via SUBCUTANEOUS

## 2020-02-09 NOTE — Progress Notes (Signed)
Patient here today for Prolia Injection. 35mL given in right arm SQ. Patient tolerated well. Next injection due around 08/09/2020

## 2020-02-17 ENCOUNTER — Telehealth: Payer: Self-pay | Admitting: Family

## 2020-02-17 NOTE — Progress Notes (Signed)
  Chronic Care Management   Outreach Note  02/17/2020 Name: [name] MRN: [id number] DOB: Aug 03, 1940  Referred by: Sandford Craze, NP Reason for referral : No chief complaint on file.   A second unsuccessful telephone outreach was attempted today. The patient was referred to pharmacist for assistance with care management and care coordination.  Follow Up Plan:   [name] [employer] Scheduler

## 2020-02-18 ENCOUNTER — Telehealth: Payer: Self-pay | Admitting: Family

## 2020-02-18 NOTE — Progress Notes (Signed)
  Chronic Care Management   Note  02/18/2020 Name: [name] MRN: [id number] DOB: Oct 13, 1940  [name] is a 79 y.o. year old female who is a primary care patient of Sandford Craze, NP. I reached out to [name] by phone today in response to a referral sent by [name]'s PCP, Sandford Craze, NP.   [name] was given information about Chronic Care Management services today including:  1. CCM service includes personalized support from designated clinical staff supervised by her physician, including individualized plan of care and coordination with other care providers 2. 24/7 contact phone numbers for assistance for urgent and routine care needs. 3. Service will only be billed when office clinical staff spend 20 minutes or more in a month to coordinate care. 4. Only one practitioner may furnish and bill the service in a calendar month. 5. The patient may stop CCM services at any time (effective at the end of the month) by phone call to the office staff.   Patient agreed to services and verbal consent obtained.   Follow up plan:   [name] [employer] Scheduler

## 2020-02-24 MED FILL — AMLODIPINE BESYLATE 5 MG TA: 5 | 90 days supply | Qty: 90 | Fill #1

## 2020-03-21 ENCOUNTER — Other Ambulatory Visit: Payer: Self-pay

## 2020-03-21 ENCOUNTER — Ambulatory Visit (INDEPENDENT_AMBULATORY_CARE_PROVIDER_SITE_OTHER): Payer: PPO | Admitting: Family

## 2020-03-21 ENCOUNTER — Encounter: Payer: Self-pay | Admitting: Family

## 2020-03-21 VITALS — BP 158/55 | HR 77 | Temp 98.5°F | Resp 16 | Ht 62.0 in | Wt 173.0 lb

## 2020-03-21 DIAGNOSIS — I10 Essential (primary) hypertension: Secondary | ICD-10-CM | POA: Diagnosis not present

## 2020-03-21 MED ORDER — AMLODIPINE BESYLATE 5 MG PO TABS: 7.5000 mg | ORAL_TABLET | Freq: Every day | ORAL | 1 refills | Status: DC

## 2020-03-21 NOTE — Patient Instructions (Signed)
Please increase amlodipine to 1.5  Tablets once daily.

## 2020-03-21 NOTE — Progress Notes (Signed)
Subjective:    Patient ID: [name], female    DOB: December 06, 1940, 79 y.o.   MRN: [id number]  HPI  Patient is a 79 yr old female who presents today to discuss hypertension. She has been noting elevated blood pressure readings at home. She is currently maintained on amlodipine 5mg , quinapril 40mg .  BP Readings from Last 3 Encounters:  03/21/20 (!) 158/55  02/01/20 140/82  07/21/19 135/64    Reports bp was175/81 this AM, had another reading 168/90's.   She continues to work on 02/03/20, exercise.  Wt Readings from Last 3 Encounters:  03/21/20 173 lb (78.5 kg)  02/01/20 177 lb (80.3 kg)  07/21/19 190 lb (86.2 kg)    Review of Systems    see HPI  Past Medical History:  Diagnosis Date   Arthritis    osteoarthritis   Barrett's esophagus    Cataract    bilateral cateracts removed   Chronic kidney disease    kidney stones   Chronic obstructive bronchitis (HCC)    Dr 02/03/20   Colitis    Colon polyps    GERD (gastroesophageal reflux disease)    History of chicken pox    History of nephrolithiasis    History of transfusion of packed red blood cells    Hyperlipidemia    Hypertension    IFG (impaired fasting glucose)    Osteoporosis    pt cannot tolerate fosamax   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   Urinary incontinence      Social History   Socioeconomic History   Marital status: Widowed    Spouse name: Not on file   Number of children: Not on file   Years of education: Not on file   Highest education level: Not on file  Occupational History   Occupation: Retired  Tobacco Use   Smoking status: Former Smoker    Years: 15.00    Quit date: 05/15/1967    Years since quitting: 52.8   Smokeless tobacco: Never Used  Substance and Sexual Activity   Alcohol use: No   Drug use: No   Sexual activity: Never  Other Topics Concern   Not on file  Social History Narrative   Regular  exercise: yes   Social Determinants of Health   Financial Resource Strain: Low Risk    Difficulty of Paying Living Expenses: Not hard at all  Food Insecurity: No Food Insecurity   Worried About Delford Field in the Last Year: Never true   Ran Out of Food in the Last Year: Never true  Transportation Needs: No Transportation Needs   Lack of Transportation (Medical): No   Lack of Transportation (Non-Medical): No  Physical Activity:    Days of Exercise per Week: Not on file   Minutes of Exercise per Session: Not on file  Stress:    Feeling of Stress : Not on file  Social Connections:    Frequency of Communication with Friends and Family: Not on file   Frequency of Social Gatherings with Friends and Family: Not on file   Attends Religious Services: Not on file   Active Member of Clubs or Organizations: Not on file   Attends 07/13/1967 Meetings: Not on file   Marital Status: Not on file  Intimate Partner Violence:    Fear of Current or Ex-Partner: Not on file   Emotionally Abused: Not on file   Physically Abused: Not on  file   Sexually Abused: Not on file    Past Surgical History:  Procedure Laterality Date   ABDOMINAL HYSTERECTOMY  1970   APPENDECTOMY     CHOLECYSTECTOMY     LEG SURGERY  2008   right   TOTAL KNEE ARTHROPLASTY  04/2006   total left knee replacement    Family History  Problem Relation Age of Onset   Coronary artery disease Mother    Arthritis Mother    Tuberculosis Other    Asthma Maternal Grandmother    Colon cancer Neg Hx    Esophageal cancer Neg Hx    Rectal cancer Neg Hx    Stomach cancer Neg Hx     Allergies  Allergen Reactions   Atorvastatin Other (See Comments)    REACTION: MUSCLE PAINS   Diclofenac-Misoprostol Diarrhea and Nausea And Vomiting   Fenofibrate Other (See Comments)    REACTION: carpopedal spasms   Rosuvastatin Other (See Comments)    REACTION: MUSCLE PAINS   Statins Other  (See Comments)    myalgias   Amoxicillin-Pot Clavulanate Other (See Comments)    REACTION: unspecified   Penicillins Hives   Sulfonamide Derivatives Hives   Tuberculin Ppd Other (See Comments)    Redness at sight    Current Outpatient Medications on File Prior to Visit  Medication Sig Dispense Refill   amLODipine (NORVASC) 5 MG tablet TAKE 1 TABLET BY MOUTH ONCE DAILY 90 tablet 1   Ascorbic Acid (VITAMIN C) 1000 MG tablet Take 1,000 mg by mouth daily.     aspirin 325 MG tablet Take 325 mg by mouth daily.       beta carotene 15 MG capsule Take 15 mg by mouth daily.       bifidobacterium infantis (ALIGN) capsule Take 1 capsule by mouth daily.       Calcium Carbonate-Vitamin D (CALTRATE 600+D) 600-400 MG-UNIT per tablet Take 1 tablet by mouth 2 (two) times daily.     carboxymethylcellulose (REFRESH PLUS) 0.5 % SOLN Place 1 drop into both eyes 2 (two) times daily.     Coenzyme Q10 200 MG capsule Take 200 mg by mouth daily.       ezetimibe (ZETIA) 10 MG tablet TAKE 1 TABLET BY MOUTH ONCE DAILY 90 tablet 1   ibuprofen (ADVIL,MOTRIN) 200 MG tablet Take 800 mg by mouth daily.     Multiple Vitamin (MULTIVITAMIN) tablet Take 1 tablet by mouth daily.       niacin 500 MG tablet Take 1,500 mg by mouth at bedtime.     Omega-3 Fatty Acids (FISH OIL) 1000 MG CAPS Take 1 capsule by mouth every morning.      omeprazole (PRILOSEC) 20 MG capsule TAKE 1 CAPSULE BY MOUTH ONCE DAILY, AND 1 CAPSULE AS NEEDED. MAX OF 2 CAPSULES PER DAY 180 capsule 1   quinapril (ACCUPRIL) 40 MG tablet TAKE 1 TABLET BY MOUTH AT BEDTIME 90 tablet 0   vitamin E (VITAMIN E) 400 UNIT capsule Take 400 Units by mouth daily.       No current facility-administered medications on file prior to visit.    BP (!) 158/55 (BP Location: Right Arm, Patient Position: Sitting, Cuff Size: Large)    Pulse 77    Temp 98.5 F (36.9 C) (Oral)    Resp 16    Ht 5\' 2"  (1.575 m)    Wt 173 lb (78.5 kg)    LMP 05/14/1968    SpO2 100%     BMI 31.64 kg/m  Objective:   Physical Exam Constitutional:      Appearance: She is well-developed.  Neck:     Thyroid: No thyromegaly.  Cardiovascular:     Rate and Rhythm: Normal rate and regular rhythm.     Heart sounds: Normal heart sounds. No murmur heard.   Pulmonary:     Effort: Pulmonary effort is normal. No respiratory distress.     Breath sounds: Normal breath sounds. No wheezing.  Musculoskeletal:     Cervical back: Neck supple.  Skin:    General: Skin is warm and dry.  Neurological:     Mental Status: She is alert and oriented to person, place, and time.  Psychiatric:        Behavior: Behavior normal.        Thought Content: Thought content normal.        Judgment: Judgment normal.           Assessment & Plan:  HTN- bp is above goal. Will increase amlodipine to 7.5mg  once daily.She will follow back up in 2 weeks and bring her bp monitor with her.  This visit occurred during the SARS-CoV-2 public health emergency.  Safety protocols were in place, including screening questions prior to the visit, additional usage of staff PPE, and extensive cleaning of exam room while observing appropriate contact time as indicated for disinfecting solutions.

## 2020-03-30 ENCOUNTER — Other Ambulatory Visit: Payer: Self-pay | Admitting: Family

## 2020-03-30 MED FILL — QUINAPRIL HCL 40 MG TABS: 40 | 90 days supply | Qty: 90 | Fill #0

## 2020-04-01 MED FILL — EZETIMIBE 10 MG TABS: 10 | 90 days supply | Qty: 90 | Fill #1

## 2020-04-04 ENCOUNTER — Ambulatory Visit (INDEPENDENT_AMBULATORY_CARE_PROVIDER_SITE_OTHER): Payer: PPO | Admitting: Family

## 2020-04-04 ENCOUNTER — Other Ambulatory Visit: Payer: Self-pay | Admitting: Family

## 2020-04-04 ENCOUNTER — Encounter: Payer: Self-pay | Admitting: Family

## 2020-04-04 ENCOUNTER — Other Ambulatory Visit: Payer: Self-pay

## 2020-04-04 VITALS — BP 155/64 | HR 79 | Temp 97.6°F | Resp 16 | Wt 171.0 lb

## 2020-04-04 DIAGNOSIS — I10 Essential (primary) hypertension: Secondary | ICD-10-CM

## 2020-04-04 MED ORDER — OMEPRAZOLE 20 MG PO CPDR: DELAYED_RELEASE_CAPSULE | ORAL | 1 refills | Status: DC

## 2020-04-04 MED ORDER — AMLODIPINE BESYLATE 10 MG PO TABS: 10.0000 mg | ORAL_TABLET | Freq: Every day | ORAL | 1 refills | Status: DC

## 2020-04-04 MED FILL — OMEPRAZOLE 20 MG CAP: 20 | 90 days supply | Qty: 180 | Fill #0

## 2020-04-04 MED FILL — AMLODIPINE BESYLATE 10 MG T: 10 | 90 days supply | Qty: 90 | Fill #0

## 2020-04-04 NOTE — Progress Notes (Signed)
Subjective:    Patient ID: [name], female    DOB: 01/27/1941, 79 y.o.   MRN: [id number]  HPI  Patient is a 79 yr old female who presents today for follow up of her hypertension.  Last visit her blood pressure was above goal. We increased her amlodipine from 5mg  to 7.5mg  once daily.  BP Readings from Last 3 Encounters:  04/04/20 (!) 155/64  03/21/20 (!) 158/55  02/01/20 140/82   Reports 150/81 last Friday, 158/55 last Monday.    Review of Systems    see HPI  Past Medical History:  Diagnosis Date   Arthritis    osteoarthritis   Barrett's esophagus    Cataract    bilateral cateracts removed   Chronic kidney disease    kidney stones   Chronic obstructive bronchitis (HCC)    Dr Thursday   Colitis    Colon polyps    GERD (gastroesophageal reflux disease)    History of chicken pox    History of nephrolithiasis    History of transfusion of packed red blood cells    Hyperlipidemia    Hypertension    IFG (impaired fasting glucose)    Osteoporosis    pt cannot tolerate fosamax   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   Urinary incontinence      Social History   Socioeconomic History   Marital status: Widowed    Spouse name: Not on file   Number of children: Not on file   Years of education: Not on file   Highest education level: Not on file  Occupational History   Occupation: Retired  Tobacco Use   Smoking status: Former Smoker    Years: 15.00    Quit date: 05/15/1967    Years since quitting: 52.9   Smokeless tobacco: Never Used  Substance and Sexual Activity   Alcohol use: No   Drug use: No   Sexual activity: Never  Other Topics Concern   Not on file  Social History Narrative   Regular exercise: yes   Social Determinants of Health   Financial Resource Strain: Low Risk    Difficulty of Paying Living Expenses: Not hard at all  Food Insecurity: No Food Insecurity    Worried About 07/13/1967 in the Last Year: Never true   Ran Out of Food in the Last Year: Never true  Transportation Needs: No Transportation Needs   Lack of Transportation (Medical): No   Lack of Transportation (Non-Medical): No  Physical Activity:    Days of Exercise per Week: Not on file   Minutes of Exercise per Session: Not on file  Stress:    Feeling of Stress : Not on file  Social Connections:    Frequency of Communication with Friends and Family: Not on file   Frequency of Social Gatherings with Friends and Family: Not on file   Attends Religious Services: Not on file   Active Member of Clubs or Organizations: Not on file   Attends Programme researcher, broadcasting/film/video Meetings: Not on file   Marital Status: Not on file  Intimate Partner Violence:    Fear of Current or Ex-Partner: Not on file   Emotionally Abused: Not on file   Physically Abused: Not on file   Sexually Abused: Not on file    Past Surgical History:  Procedure Laterality Date   ABDOMINAL HYSTERECTOMY  1970   APPENDECTOMY     CHOLECYSTECTOMY  LEG SURGERY  2008   right   TOTAL KNEE ARTHROPLASTY  04/2006   total left knee replacement    Family History  Problem Relation Age of Onset   Coronary artery disease Mother    Arthritis Mother    Tuberculosis Other    Asthma Maternal Grandmother    Colon cancer Neg Hx    Esophageal cancer Neg Hx    Rectal cancer Neg Hx    Stomach cancer Neg Hx     Allergies  Allergen Reactions   Atorvastatin Other (See Comments)    REACTION: MUSCLE PAINS   Diclofenac-Misoprostol Diarrhea and Nausea And Vomiting   Fenofibrate Other (See Comments)    REACTION: carpopedal spasms   Rosuvastatin Other (See Comments)    REACTION: MUSCLE PAINS   Statins Other (See Comments)    myalgias   Amoxicillin-Pot Clavulanate Other (See Comments)    REACTION: unspecified   Penicillins Hives   Sulfonamide Derivatives Hives   Tuberculin Ppd Other  (See Comments)    Redness at sight    Current Outpatient Medications on File Prior to Visit  Medication Sig Dispense Refill   amLODipine (NORVASC) 5 MG tablet Take 1.5 tablets (7.5 mg total) by mouth daily. 135 tablet 1   Ascorbic Acid (VITAMIN C) 1000 MG tablet Take 1,000 mg by mouth daily.     aspirin 325 MG tablet Take 325 mg by mouth daily.       beta carotene 15 MG capsule Take 15 mg by mouth daily.       bifidobacterium infantis (ALIGN) capsule Take 1 capsule by mouth daily.       Calcium Carbonate-Vitamin D (CALTRATE 600+D) 600-400 MG-UNIT per tablet Take 1 tablet by mouth 2 (two) times daily.     carboxymethylcellulose (REFRESH PLUS) 0.5 % SOLN Place 1 drop into both eyes 2 (two) times daily.     Coenzyme Q10 200 MG capsule Take 200 mg by mouth daily.       ezetimibe (ZETIA) 10 MG tablet TAKE 1 TABLET BY MOUTH ONCE DAILY 90 tablet 1   ibuprofen (ADVIL,MOTRIN) 200 MG tablet Take 800 mg by mouth daily.     Multiple Vitamin (MULTIVITAMIN) tablet Take 1 tablet by mouth daily.       niacin 500 MG tablet Take 1,500 mg by mouth at bedtime.     Omega-3 Fatty Acids (FISH OIL) 1000 MG CAPS Take 1 capsule by mouth every morning.      omeprazole (PRILOSEC) 20 MG capsule TAKE 1 CAPSULE BY MOUTH ONCE DAILY, AND 1 CAPSULE AS NEEDED. MAX OF 2 CAPSULES PER DAY 180 capsule 1   quinapril (ACCUPRIL) 40 MG tablet TAKE 1 TABLET BY MOUTH AT BEDTIME 90 tablet 0   vitamin E (VITAMIN E) 400 UNIT capsule Take 400 Units by mouth daily.       No current facility-administered medications on file prior to visit.    BP (!) 155/64 (BP Location: Right Arm, Patient Position: Sitting, Cuff Size: Large)    Pulse 79    Temp 97.6 F (36.4 C) (Temporal)    Resp 16    Wt 171 lb (77.6 kg)    LMP 05/14/1968    SpO2 100%    BMI 31.28 kg/m    Objective:   Physical Exam Constitutional:      Appearance: She is well-developed.  Neck:     Thyroid: No thyromegaly.  Cardiovascular:     Rate and Rhythm:  Normal rate and regular rhythm.  Heart sounds: Normal heart sounds. No murmur heard.   Pulmonary:     Effort: Pulmonary effort is normal. No respiratory distress.     Breath sounds: Normal breath sounds. No wheezing.  Musculoskeletal:     Cervical back: Neck supple.  Skin:    General: Skin is warm and dry.  Neurological:     Mental Status: She is alert and oriented to person, place, and time.  Psychiatric:        Behavior: Behavior normal.        Thought Content: Thought content normal.        Judgment: Judgment normal.           Assessment & Plan:  HTN- bp remains uncontrolled despite increase in amlodipine from 5mg  to 7.5mg . Will plan to increase amlodipine to 10mg  and have her return in 1 month for re-evaluation.  This visit occurred during the SARS-CoV-2 public health emergency.  Safety protocols were in place, including screening questions prior to the visit, additional usage of staff PPE, and extensive cleaning of exam room while observing appropriate contact time as indicated for disinfecting solutions.

## 2020-04-04 NOTE — Patient Instructions (Signed)
Please increase your amlodipine to 10mg once daily

## 2020-04-22 ENCOUNTER — Other Ambulatory Visit: Payer: Self-pay

## 2020-04-22 DIAGNOSIS — R739 Hyperglycemia, unspecified: Secondary | ICD-10-CM

## 2020-04-22 DIAGNOSIS — I10 Essential (primary) hypertension: Secondary | ICD-10-CM

## 2020-04-29 ENCOUNTER — Ambulatory Visit: Payer: PPO | Admitting: Pharmacist

## 2020-04-29 ENCOUNTER — Other Ambulatory Visit: Payer: Self-pay

## 2020-04-29 DIAGNOSIS — I10 Essential (primary) hypertension: Secondary | ICD-10-CM

## 2020-04-29 DIAGNOSIS — E785 Hyperlipidemia, unspecified: Secondary | ICD-10-CM

## 2020-04-29 NOTE — Chronic Care Management (AMB) (Signed)
Chronic Care Management Pharmacy  Name: [name]  MRN: [id number] DOB: [birth year]  Chief Complaint/ HPI  [name],  79 y.o. , female presents for their Initial CCM visit with the clinical pharmacist via telephone.  PCP : [name], NP  Their chronic conditions include: Hypertension, Hyperlipidemia, GERD, Osteoporosis  Office Visits: 04/04/20: Visit w/ [name], NP - Increase amlodipine to 1m daily. RTC 1 month.   03/21/20: Visit w/ MDebbrah Alar NP - Increase amlodipine to 7.569mdaily. RTC 2 weeks.   02/01/20: Visit w/ MeDebbrah AlarNP - No med changes noted.   Consult Visit: None in last 6 months.   Medications: Outpatient Encounter Medications as of 04/29/2020  Medication Sig Note  . amLODipine (NORVASC) 10 MG tablet Take 1 tablet (10 mg total) by mouth daily.   . Ascorbic Acid (VITAMIN C) 1000 MG tablet Take 1,000 mg by mouth daily.   . Marland Kitchenspirin 325 MG tablet Take 325 mg by mouth daily. 04/29/2020: Recommended pt to move to 8145mnd she is agreeable  . Calcium Carbonate-Vitamin D 600-400 MG-UNIT tablet Take 1 tablet by mouth 2 (two) times daily.   . carboxymethylcellulose (REFRESH PLUS) 0.5 % SOLN Place 1 drop into both eyes 2 (two) times daily.   . Coenzyme Q10 200 MG capsule Take 200 mg by mouth daily.   . eMarland Kitchenetimibe (ZETIA) 10 MG tablet TAKE 1 TABLET BY MOUTH ONCE DAILY   . ibuprofen (ADVIL,MOTRIN) 200 MG tablet Take 800 mg by mouth daily.   . Multiple Vitamin (MULTIVITAMIN) tablet Take 1 tablet by mouth daily.   . niacin 500 MG tablet Take 500 mg by mouth at bedtime.   . Omega-3 Fatty Acids (FISH OIL) 1000 MG CAPS Take 1 capsule by mouth every morning.   . oMarland Kitcheneprazole (PRILOSEC) 20 MG capsule TAKE 1 CAPSULE BY MOUTH ONCE DAILY, AND 1 CAPSULE AS NEEDED. MAX OF 2 CAPSULES PER DAY   . quinapril (ACCUPRIL) 40 MG tablet TAKE 1 TABLET BY MOUTH AT BEDTIME   . vitamin E 180 MG (400 UNITS) capsule Take 400 Units by mouth daily.   .  [DISCONTINUED] beta carotene 15 MG capsule Take 15 mg by mouth daily.     . [DISCONTINUED] bifidobacterium infantis (ALIGN) capsule Take 1 capsule by mouth daily.      No facility-administered encounter medications on file as of 04/29/2020.   SDOH Screenings   Alcohol Screen: Not on file  Depression (PHQ2-9): Low Risk   . PHQ-2 Score: 0  Financial Resource Strain: Low Risk   . Difficulty of Paying Living Expenses: Not hard at all  Food Insecurity: No Food Insecurity  . Worried About RunCharity fundraiser the Last Year: Never true  . Ran Out of Food in the Last Year: Never true  Housing: Low Risk   . Last Housing Risk Score: 0  Physical Activity: Sufficiently Active  . Days of Exercise per Week: 7 days  . Minutes of Exercise per Session: 40 min  Social Connections: Not on file  Stress: Not on file  Tobacco Use: Medium Risk  . Smoking Tobacco Use: Former Smoker  . Smokeless Tobacco Use: Never Used  Transportation Needs: No Transportation Needs  . Lack of Transportation (Medical): No  . Lack of Transportation (Non-Medical): No    Current Diagnosis/Assessment:  Goals Addressed            This Visit's Progress   . Chronic Care Management Pharmacy Care Plan  CARE PLAN ENTRY (see longitudinal plan of care for additional care plan information)  Current Barriers:  . Chronic Disease Management support, education, and care coordination needs related to Hypertension, Hyperlipidemia, GERD, Osteoporosis   Hypertension BP Readings from Last 3 Encounters:  04/04/20 (!) 155/64  03/21/20 (!) 158/55  02/01/20 140/82   . Pharmacist Clinical Goal(s): o Over the next 90 days, patient will work with PharmD and providers to achieve BP goal <140/90 . Current regimen:  . Amlodipine 74m daily . Quinapril 470mdaily . Interventions: o Discussed BP goal o Recommended that patient purchase new blood pressure cuff . Patient self care activities - Over the next 90 days, patient  will: o Check BP 2-3 times per week, document, and provide at future appointments o Ensure daily salt intake < 2300 mg/day o Purchase new blood pressure cuff  Hyperlipidemia Lab Results  Component Value Date/Time   LDLCALC 170 (H) 02/01/2020 08:34 AM   LDLDIRECT 158.8 06/10/2008 08:23 AM   . Pharmacist Clinical Goal(s): o Over the next 90 days, patient will work with PharmD and providers to achieve LDL goal < 100 or prevent further increase in LDL . Current regimen:  . Ezetimibe 1093maily . Krill Oil 350m41mily . Niacin 500mg44mly . Interventions: o Discussed diet and exercise o Discussed LDL goal  . Patient self care activities - Over the next 90 days, patient will: o Maintain cholesterol medication regimen.   Health Maintenance  . Pharmacist Clinical Goal(s) o Over the next 90 days, patient will work with PharmD and providers to complete health maintenance screenings/vaccinations . Interventions: o Recommended patient receive Tetanus vaccine o Recommended patient decrease aspirin to 81mg 30mtient self care activities - Over the next 90 days, patient will: o Consider getting tetanus vaccine o Decrease daily dose of aspirin down to 81mg  60medication management . Pharmacist Clinical Goal(s): o Over the next 90 days, patient will work with PharmD and providers to maintain optimal medication adherence . [facility] o Comprehensive medication review performed. o Continue current medication management strategy . Patient self care activities - Over the next 90 days, patient will: o Focus on medication adherence by filling and taking medications appropriately  o Take medications as prescribed o Report any questions or concerns to PharmD and/or provider(s)  Initial goal documentation       Social Hx:  Lives alone.  [place]aughter in [place] Son is a pastor Theme park manager[place]urch [place]  her son a lot with church trips/activities  Hypertension   BP goal is:  <140/90  Office blood pressures are  BP Readings from Last 3 Encounters:  04/04/20 (!) 155/64  03/21/20 (!) 158/55  02/01/20 140/82   Patient checks BP at home infrequently Patient home BP readings are ranging: Unable to assess  Patient has failed these meds in the past: hctz (low sodium) Patient is currently uncontrolled on the following medications:  . Amlodipine 10mg da42m(did not originally mention with medications on her list, but found the bottle) . Quinapril 40mg dai106mtakes in the morning vs bedtime; has always taken it this way)  Patient admits that she takes both medications in the morning although quinapril is listed to take at bedtime (pt may benefit from taking quinapril at bedtime considering chronotherapy theory. This has to be weighed against patient adherence)  States she has started exercising and has lost weight She feels  her BP has gone down since increasing amlodipine to 52m Has a BP cuff, but when she brought her cuff to clinic it was not similar to clinic readings She checked BP while on call and reported a systolic in the 1003B but she does not feel this is accurate. Denies headaches, chest pain, or dizziness.  Encouraged patient to report to ED if she develops symptoms.  She confirmed she is coming in on Monday for recheck.  We discussed BP goal  Plan -Consider purchasing new BP cuff -Continue current medications   Future Plan -If patient's BP remains elevated with increased dose of amlodipine, recommend patient do move quinapril to nighttime to see if this improves BP and possibly reduce her risk of cardiovascular disease  Hyperlipidemia   LDL goal < 100  Last lipids Lab Results  Component Value Date   CHOL 267 (H) 02/01/2020   HDL 59 02/01/2020   LDLCALC 170 (H) 02/01/2020   LDLDIRECT 158.8 06/10/2008   TRIG 212 (H) 02/01/2020   CHOLHDL 4.5 02/01/2020   Hepatic  Function Latest Ref Rng & Units 02/01/2020 01/20/2019 07/23/2018  Total Protein 6.1 - 8.1 g/dL 6.5 6.4 6.5  Albumin 3.5 - 5.2 g/dL - 4.3 4.3  AST 10 - 35 U/L _0 ALT 6 - 29 U/L _1 Alk Phosphatase 39 - 117 U/L - 61 74  Total Bilirubin 0.2 - 1.2 mg/dL 0.4 0.5 0.4  Bilirubin, Direct 0.0 - 0.3 mg/dL - - -     The 10-year ASCVD risk score (Mikey BussingDC Jr., et al., 2013) is: 65.2%   Values used to calculate the score:     Age: [age]years     Sex: Female     Is Non-Hispanic African American: No     Diabetic: Yes     Tobacco smoker: No     Systolic Blood Pressure: 1048mmHg     Is BP treated: Yes     HDL Cholesterol: 59 mg/dL     Total Cholesterol: 267 mg/dL   Patient has failed these meds in past: statins (myalgias), fenofibrate (carpopedal spasms) Patient is currently uncontrolled on the following medications:  . Ezetimibe 134mdaily . Krill Oil 35053maily . Niacin 500m79mily  States her cholesterol has always been high and feels it is hereditary.  It was elevated even when she was 120lbs.  Diet Doesn't cook a lot since she lives alone B - Oatmeal sometimes with egg and whole wheat bread L - Trying to eat less beef, trying to eat more chicken; had stir fry chicken yesterday D - Hot dog Snacks - Cashews (~20 per Gera Inboden) Drinks - Coke twice a week  Exercise Uses noom to monitor her calories burned Treadmill for 40 mins daily Sometimes will use walker to walk up and down road  We discussed:  diet and exercise extensively and LDL goal  Plan -Continue current medications  GERD/Barrett's Esophagus   Patient has failed these meds in past: None noted  Patient is currently controlled on the following medications:  . Omeprazole 20mg57me to twice daily  Breakthrough Sx: when she eats fried foods Breakthrough Tx: additional omeprazole Triggers: fried foods  Plan -Continue current medications  Osteoporosis   Last DEXA Scan: 07/23/2018  T-Score femoral neck:  -0.5  T-Score lumbar spine: 0.8  T-Score forearm radius: -3.6   Vit D, 25-Hydroxy  Date Value Ref Range Status  08/27/2012 48 30 - 89 ng/mL Final    Comment:  This assay accurately quantifies Vitamin D, which is the sum of the 25-Hydroxy forms of Vitamin D2 and D3.  Studies have shown that the optimum concentration of 25-Hydroxy Vitamin D is 30 ng/mL or higher.  Concentrations of Vitamin D between 20 and 29 ng/mL are considered to be insufficient and concentrations less than 20 ng/mL are considered to be deficient for Vitamin D.    Patient has failed these meds in past: alendronate (esophagus issues) Patient is currently controlled on the following medications:  . Prolia 50m every 6 months (last injection 02/09/20)  Next injection due March 2022  Plan -Continue current medications   Vaccines   Reviewed and discussed patient's vaccination history.   Immunization History  Administered Date(s) Administered  . Fluad Quad(high Dose 65+) 02/25/2019, 02/01/2020  . Influenza Split 04/18/2011, 02/08/2012  . Influenza Whole 04/01/2007, 02/02/2008, 01/31/2009, 02/20/2010  . Influenza, High Dose Seasonal PF 04/01/2018  . Influenza,inj,Quad PF,6+ Mos 02/11/2014, 01/28/2015, 03/27/2016  . Influenza-Unspecified 03/14/2013, 03/05/2017  . PFIZER SARS-COV-2 Vaccination 06/03/2019, 06/24/2019, 03/03/2020  . Pneumococcal Conjugate-13 03/23/2015  . Pneumococcal Polysaccharide-23 07/24/2006, 02/25/2019  . Td 03/19/2002, 05/14/2006  . Zoster Recombinat (Shingrix) 04/02/2017, 06/02/2017   Discussed Tetanus vaccine, but she is hesitant to receive  Plan -Recommended patient receive tetanus vaccine in pharmacy.   Medication Management   [facility]:  [facility]Phone: [phone]: [address]  [address]Phone: [phone]: [phone] Uses pill box? Yes (weekly)  Miscellaneous Meds Vitamin D 5000 units Vitamin C 10038m Vitamin E 400 units  Aspirin 32526mrecommend patient move down to 38m53miscussed risk/benefit of long term use of aspirin.  Noted that risk > benefit over age 71 f68 primary prevention, but noting pt's elevated ASCVD risk and intolerance to statins, agreeable she continue aspirin therapy, but at dose no higher than 38mg59miscussed the risk of aspirin use plus NSAID use and patient confirms she takes both after eating food.   Plan  Continue current medication management strategy   Follow up: 3 month phone visit  KanesDe BlanchrmD, BCACP Clinical Pharmacist [phone]

## 2020-04-29 NOTE — Patient Instructions (Addendum)
Visit Information  Goals Addressed            This Visit's Progress   . Chronic Care Management Pharmacy Care Plan       CARE PLAN ENTRY (see longitudinal plan of care for additional care plan information)  Current Barriers:  . Chronic Disease Management support, education, and care coordination needs related to Hypertension, Hyperlipidemia, GERD, Osteoporosis   Hypertension BP Readings from Last 3 Encounters:  04/04/20 (!) 155/64  03/21/20 (!) 158/55  02/01/20 140/82   . Pharmacist Clinical Goal(s): o Over the next 90 days, patient will work with PharmD and providers to achieve BP goal <140/90 . Current regimen:  . Amlodipine 10mg  daily . Quinapril 40mg  daily . Interventions: o Discussed BP goal o Recommended that patient purchase new blood pressure cuff . Patient self care activities - Over the next 90 days, patient will: o Check BP 2-3 times per week, document, and provide at future appointments o Ensure daily salt intake < 2300 mg/[name] o Purchase new blood pressure cuff  Hyperlipidemia Lab Results  Component Value Date/Time   LDLCALC 170 (H) 02/01/2020 08:34 AM   LDLDIRECT 158.8 06/10/2008 08:23 AM   . Pharmacist Clinical Goal(s): o Over the next 90 days, patient will work with PharmD and providers to achieve LDL goal < 100 or prevent further increase in LDL . Current regimen:  . Ezetimibe 10mg  daily . Krill Oil 350mg  daily . Niacin 500mg  daily . Interventions: o Discussed diet and exercise o Discussed LDL goal  . Patient self care activities - Over the next 90 days, patient will: o Maintain cholesterol medication regimen.   Health Maintenance  . Pharmacist Clinical Goal(s) o Over the next 90 days, patient will work with PharmD and providers to complete health maintenance screenings/vaccinations . Interventions: o Recommended patient receive Tetanus vaccine o Recommended patient decrease aspirin to 81mg  . Patient self care activities - Over the next 90  days, patient will: o Consider getting tetanus vaccine o Decrease daily dose of aspirin down to 81mg      Medication management . Pharmacist Clinical Goal(s): o Over the next 90 days, patient will work with PharmD and providers to maintain optimal medication adherence . Current pharmacy: 02/03/2020 Pharmacy . Interventions o Comprehensive medication review performed. o Continue current medication management strategy . Patient self care activities - Over the next 90 days, patient will: o Focus on medication adherence by filling and taking medications appropriately  o Take medications as prescribed o Report any questions or concerns to PharmD and/or provider(s)  Initial goal documentation        [name] was given information about Chronic Care Management services today including:  1. CCM service includes personalized support from designated clinical staff supervised by her physician, including individualized plan of care and coordination with other care providers 2. 24/7 contact phone numbers for assistance for urgent and routine care needs. 3. Standard insurance, coinsurance, copays and deductibles apply for chronic care management only during months in which we provide at least 20 minutes of these services. Most insurances cover these services at 100%, however patients may be responsible for any copay, coinsurance and/or deductible if applicable. This service may help you avoid the need for more expensive face-to-face services. 4. Only one practitioner may furnish and bill the service in a calendar month. 5. The patient may stop CCM services at any time (effective at the end of the month) by phone call to the office staff.  Patient agreed to  services and verbal consent obtained.   The patient verbalized understanding of instructions, educational materials, and care plan provided today and agreed to receive a mailed copy of patient instructions, educational materials, and  care plan.  Telephone follow up appointment with pharmacy team member scheduled for: 07/28/2020  Dannielle Karvonen [name], [facility]    How to Take Your Blood Pressure You can take your blood pressure at home with a machine. You may need to check your blood pressure at home:  To check if you have high blood pressure (hypertension).  To check your blood pressure over time.  To make sure your blood pressure medicine is working. Supplies needed: You will need a blood pressure machine, or monitor. You can buy one at a drugstore or online. When choosing one:  Choose one with an arm cuff.  Choose one that wraps around your upper arm. Only one finger should fit between your arm and the cuff.  Do not choose one that measures your blood pressure from your wrist or finger. Your doctor can suggest a monitor. How to prepare Avoid these things for 30 minutes before checking your blood pressure:  Drinking caffeine.  Drinking alcohol.  Eating.  Smoking.  Exercising. Five minutes before checking your blood pressure:  Pee.  Sit in a dining chair. Avoid sitting in a soft couch or armchair.  Be quiet. Do not talk. How to take your blood pressure Follow the instructions that came with your machine. If you have a digital blood pressure monitor, these may be the instructions: 1. Sit up straight. 2. Place your feet on the floor. Do not cross your ankles or legs. 3. Rest your left arm at the level of your heart. You may rest it on a table, desk, or chair. 4. Pull up your shirt sleeve. 5. Wrap the blood pressure cuff around the upper part of your left arm. The cuff should be 1 inch (2.5 cm) above your elbow. It is best to wrap the cuff around bare skin. 6. Fit the cuff snugly around your arm. You should be able to place only one finger between the cuff and your arm. 7. Put the cord inside the groove of your elbow. 8. Press the power button. 9. Sit quietly while the cuff fills with air and loses  air. 10. Write down the numbers on the screen. 11. Wait 2-3 minutes and then repeat steps 1-10. What do the numbers mean? Two numbers make up your blood pressure. The first number is called systolic pressure. The second is called diastolic pressure. An example of a blood pressure reading is "120 over 80" (or 120/80). If you are an adult and do not have a medical condition, use this guide to find out if your blood pressure is normal: Normal  First number: below 120.  Second number: below 80. Elevated  First number: 120-129.  Second number: below 80. Hypertension stage 1  First number: 130-139.  Second number: 80-89. Hypertension stage 2  First number: 140 or above.  Second number: 90 or above. Your blood pressure is above normal even if only the top or bottom number is above normal. Follow these instructions at home:  Check your blood pressure as often as your doctor tells you to.  Take your monitor to your next doctor's appointment. Your doctor will: ? Make sure you are using it correctly. ? Make sure it is working right.  Make sure you understand what your blood pressure numbers should be.  Tell your doctor if your  medicines are causing side effects. Contact a doctor if:  Your blood pressure keeps being high. Get help right away if:  Your first blood pressure number is higher than 180.  Your second blood pressure number is higher than 120. This information is not intended to replace advice given to you by your health care provider. Make sure you discuss any questions you have with your health care provider. Document Revised: 04/12/2017 Document Reviewed: 10/07/2015 Elsevier Patient Education  2020 [employer].

## 2020-05-02 ENCOUNTER — Encounter: Payer: Self-pay | Admitting: Family

## 2020-05-02 ENCOUNTER — Ambulatory Visit (INDEPENDENT_AMBULATORY_CARE_PROVIDER_SITE_OTHER): Payer: PPO | Admitting: Family

## 2020-05-02 ENCOUNTER — Other Ambulatory Visit: Payer: Self-pay

## 2020-05-02 VITALS — BP 136/64 | HR 75 | Temp 98.5°F | Resp 16 | Ht 62.0 in | Wt 171.0 lb

## 2020-05-02 DIAGNOSIS — I10 Essential (primary) hypertension: Secondary | ICD-10-CM

## 2020-05-02 MED ORDER — ASPIRIN 81 MG PO TBEC: 81.0000 mg | DELAYED_RELEASE_TABLET | Freq: Every day | ORAL | 12 refills | Status: AC

## 2020-05-02 NOTE — Progress Notes (Signed)
Subjective:    Patient ID: [name], female    DOB: [birth year], 79 y.o.   MRN: [id number]  HPI  Patient is a 79 yr old female who presents today for follow up.  HTN- maintained on amlodipine 10mg  once daily, accupril 40mg . Last visit we increased her amlodipine from 7.5mg  to 10mg  once daily.   BP Readings from Last 3 Encounters:  05/02/20 136/64  04/04/20 (!) 155/64  03/21/20 (!) 158/55  Review of Systems    see HPI  Past Medical History:  Diagnosis Date  . Arthritis    osteoarthritis  . Barrett's esophagus   . Cataract    bilateral cateracts removed  . Chronic kidney disease    kidney stones  . Chronic obstructive bronchitis (HCC)    Dr 05/04/20  . Colitis   . Colon polyps   . GERD (gastroesophageal reflux disease)   . History of chicken pox   . History of nephrolithiasis   . History of transfusion of packed red blood cells   . Hyperlipidemia   . Hypertension   . IFG (impaired fasting glucose)   . Osteoporosis    pt cannot tolerate fosamax  . 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  04/06/20 Urinary incontinence      Social History   Socioeconomic History  . Marital status: Widowed    Spouse name: Not on file  . Number of children: Not on file  . Years of education: Not on file  . Highest education level: Not on file  Occupational History  . Occupation: Retired  Tobacco Use  . Smoking status: Former Smoker    Years: 15.00    Quit date: 05/15/1967    Years since quitting: 53.0  . Smokeless tobacco: Never Used  Substance and Sexual Activity  . Alcohol use: No  . Drug use: No  . Sexual activity: Never  Other Topics Concern  . Not on file  Social History Narrative   Regular exercise: yes   Social Determinants of Health   Financial Resource Strain: Low Risk   . Difficulty of Paying Living Expenses: Not hard at all  Food Insecurity: No Food Insecurity  . Worried About [place] in the Last Year:  Never true  . Ran Out of Food in the Last Year: Never true  Transportation Needs: No Transportation Needs  . Lack of Transportation (Medical): No  . Lack of Transportation (Non-Medical): No  Physical Activity: Sufficiently Active  . Days of Exercise per Week: 7 days  . Minutes of Exercise per Session: 40 min  Stress: Not on file  Social Connections: Not on file  Intimate Partner Violence: Not on file    Past Surgical History:  Procedure Laterality Date  . ABDOMINAL HYSTERECTOMY  1970  . APPENDECTOMY    . CHOLECYSTECTOMY    . LEG SURGERY  2008   right  . TOTAL KNEE ARTHROPLASTY  04/2006   total left knee replacement    Family History  Problem Relation Age of Onset  . Coronary artery disease Mother   . Arthritis Mother   . Tuberculosis Other   . Asthma Maternal Grandmother   . Colon cancer Neg Hx   . Esophageal cancer Neg Hx   . Rectal cancer Neg Hx   . Stomach cancer Neg Hx     Allergies  Allergen Reactions  . Atorvastatin Other (See Comments)    REACTION: MUSCLE PAINS  . Diclofenac-Misoprostol  Diarrhea and Nausea And Vomiting  . Fenofibrate Other (See Comments)    REACTION: carpopedal spasms  . Rosuvastatin Other (See Comments)    REACTION: MUSCLE PAINS  . Statins Other (See Comments)    myalgias  . Amoxicillin-Pot Clavulanate Other (See Comments)    REACTION: unspecified  . Penicillins Hives  . Sulfonamide Derivatives Hives  . Tuberculin Ppd Other (See Comments)    Redness at sight    Current Outpatient Medications on File Prior to Visit  Medication Sig Dispense Refill  . amLODipine (NORVASC) 10 MG tablet Take 1 tablet (10 mg total) by mouth daily. 90 tablet 1  . Ascorbic Acid (VITAMIN C) 1000 MG tablet Take 1,000 mg by mouth daily.    [name] aspirin 325 MG tablet Take 325 mg by mouth daily.    . Calcium Carbonate-Vitamin D 600-400 MG-UNIT tablet Take 1 tablet by mouth 2 (two) times daily.    . carboxymethylcellulose (REFRESH PLUS) 0.5 % SOLN Place 1 drop into  both eyes 2 (two) times daily.    . Coenzyme Q10 200 MG capsule Take 200 mg by mouth daily.    [name] ezetimibe (ZETIA) 10 MG tablet TAKE 1 TABLET BY MOUTH ONCE DAILY 90 tablet 1  . ibuprofen (ADVIL,MOTRIN) 200 MG tablet Take 800 mg by mouth daily.    . Multiple Vitamin (MULTIVITAMIN) tablet Take 1 tablet by mouth daily.    . niacin 500 MG tablet Take 500 mg by mouth at bedtime.    . Omega-3 Fatty Acids (FISH OIL) 1000 MG CAPS Take 1 capsule by mouth every morning.    [name] omeprazole (PRILOSEC) 20 MG capsule TAKE 1 CAPSULE BY MOUTH ONCE DAILY, AND 1 CAPSULE AS NEEDED. MAX OF 2 CAPSULES PER DAY 180 capsule 1  . quinapril (ACCUPRIL) 40 MG tablet TAKE 1 TABLET BY MOUTH AT BEDTIME 90 tablet 0  . vitamin E 180 MG (400 UNITS) capsule Take 400 Units by mouth daily.     No current facility-administered medications on file prior to visit.    BP 136/64 (BP Location: Right Arm, Patient Position: Sitting, Cuff Size: Small)   Pulse 75   Temp 98.5 F (36.9 C) (Oral)   Resp 16   Ht 5\' 2"  (1.575 m)   Wt 171 lb (77.6 kg)   LMP 05/14/1968   SpO2 99%   BMI 31.28 kg/m    Objective:   Physical Exam Constitutional:      Appearance: She is well-developed and well-nourished.  Neck:     Thyroid: No thyromegaly.  Cardiovascular:     Rate and Rhythm: Normal rate and regular rhythm.     Heart sounds: Normal heart sounds. No murmur heard.   Pulmonary:     Effort: Pulmonary effort is normal. No respiratory distress.     Breath sounds: Normal breath sounds. No wheezing.  Musculoskeletal:     Cervical back: Neck supple.  Skin:    General: Skin is warm and dry.  Neurological:     Mental Status: She is alert and oriented to person, place, and time.  Psychiatric:        Mood and Affect: Mood and affect normal.        Behavior: Behavior normal.        Thought Content: Thought content normal.        Judgment: Judgment normal.           Assessment & Plan:  HTN- bp stable/improved. Continue  amlodipine  10 mg once daily, accupril 40mg .  This visit occurred during the SARS-CoV-2 public health emergency.  Safety protocols were in place, including screening questions prior to the visit, additional usage of staff PPE, and extensive cleaning of exam room while observing appropriate contact time as indicated for disinfecting solutions.

## 2020-05-30 ENCOUNTER — Telehealth: Payer: Self-pay | Admitting: Pharmacist

## 2020-05-30 NOTE — Progress Notes (Addendum)
Chronic Care Management Pharmacy Assistant   Name: [name]  MRN: [id number] DOB: April 30, 1941  Reason for Encounter: HTN Disease State  Patient Questions:  1.  Have you seen any other providers since your last visit? Yes  2.  Any changes in your medicines or health? Yes    PCP : Sandford Craze, NP   Their chronic conditions include: Hypertension, Hyperlipidemia, GERD, Osteoporosis  Office Visit: 05-02-2020 (PCP) Patient presented in the office for a follow up. BP has been stable since increasing Amlodipine from 7.5 mg to 10 mg.  Medication changes: Aspirin 81 mg tab have been replaced with 325 mg.  Consults: None since their last CCM visit with the clinical pharmacist on 04-29-2020.  Allergies:   Allergies  Allergen Reactions   Atorvastatin Other (See Comments)    REACTION: MUSCLE PAINS   Diclofenac-Misoprostol Diarrhea and Nausea And Vomiting   Fenofibrate Other (See Comments)    REACTION: carpopedal spasms   Rosuvastatin Other (See Comments)    REACTION: MUSCLE PAINS   Statins Other (See Comments)    myalgias   Amoxicillin-Pot Clavulanate Other (See Comments)    REACTION: unspecified   Penicillins Hives   Sulfonamide Derivatives Hives   Tuberculin Ppd Other (See Comments)    Redness at sight    Medications: Outpatient Encounter Medications as of 05/30/2020  Medication Sig   amLODipine (NORVASC) 10 MG tablet Take 1 tablet (10 mg total) by mouth daily.   Ascorbic Acid (VITAMIN C) 1000 MG tablet Take 1,000 mg by mouth daily.   aspirin (ASPIRIN 81) 81 MG EC tablet Take 1 tablet (81 mg total) by mouth daily. Swallow whole.   Calcium Carbonate-Vitamin D 600-400 MG-UNIT tablet Take 1 tablet by mouth 2 (two) times daily.   carboxymethylcellulose (REFRESH PLUS) 0.5 % SOLN Place 1 drop into both eyes 2 (two) times daily.   Coenzyme Q10 200 MG capsule Take 200 mg by mouth daily.   ezetimibe (ZETIA) 10 MG tablet TAKE 1 TABLET BY MOUTH ONCE DAILY   ibuprofen  (ADVIL,MOTRIN) 200 MG tablet Take 800 mg by mouth daily.   Multiple Vitamin (MULTIVITAMIN) tablet Take 1 tablet by mouth daily.   niacin 500 MG tablet Take 500 mg by mouth at bedtime.   Omega-3 Fatty Acids (FISH OIL) 1000 MG CAPS Take 1 capsule by mouth every morning.   omeprazole (PRILOSEC) 20 MG capsule TAKE 1 CAPSULE BY MOUTH ONCE DAILY, AND 1 CAPSULE AS NEEDED. MAX OF 2 CAPSULES PER DAY   quinapril (ACCUPRIL) 40 MG tablet TAKE 1 TABLET BY MOUTH AT BEDTIME   vitamin E 180 MG (400 UNITS) capsule Take 400 Units by mouth daily.   No facility-administered encounter medications on file as of 05/30/2020.    Current Diagnosis: Patient Active Problem List   Diagnosis Date Noted   Preventative health care 12/14/2014   Hyponatremia 02/20/2014   Hypokalemia 02/20/2014   Hyperglycemia 11/17/2013   Need for prophylactic vaccination and inoculation against influenza 02/08/2012   HYPERCHOLESTEROLEMIA 04/01/2008   BARRETTS ESOPHAGUS 04/01/2008   OSTEOARTHRITIS 04/01/2008   URINARY INCONTINENCE 04/01/2008   TOBACCO ABUSE, HX OF 04/01/2008   GERD 09/24/2007   Essential hypertension 04/01/2007   Osteoporosis 05/31/2006   COLONIC POLYPS, HX OF 05/31/2006   NEPHROLITHIASIS, HX OF 05/31/2006    Goals Addressed   None    Reviewed chart prior to disease state call. Spoke with patient regarding BP  Recent Office Vitals: BP Readings from Last 3 Encounters:  05/02/20 136/64  04/04/20 [name])  155/64  03/21/20 (!) 158/55   Pulse Readings from Last 3 Encounters:  05/02/20 75  04/04/20 79  03/21/20 77    Wt Readings from Last 3 Encounters:  05/02/20 171 lb (77.6 kg)  04/04/20 171 lb (77.6 kg)  03/21/20 173 lb (78.5 kg)     Kidney Function Lab Results  Component Value Date/Time   CREATININE 0.72 02/01/2020 08:34 AM   CREATININE 0.67 02/03/2019 07:43 AM   CREATININE 0.74 01/20/2019 09:48 AM   CREATININE 0.71 01/17/2018 02:06 PM   GFR 85.17 02/03/2019 07:43 AM   GFRNONAA 82 (L) 02/17/2014  05:45 AM   GFRNONAA 84 11/17/2013 09:15 AM   GFRAA >90 02/17/2014 05:45 AM   GFRAA >89 11/17/2013 09:15 AM    BMP Latest Ref Rng & Units 02/01/2020 02/03/2019 01/20/2019  Glucose 65 - 99 mg/dL 834(H) 96 962(I)  BUN 7 - 25 mg/dL 10 9 14   Creatinine 0.60 - 0.93 mg/dL 2.97 9.89  BUN/Creat Ratio 6 - 22 (calc) NOT APPLICABLE - -  Sodium 135 - 146 mmol/L 139 134(L) 130(L)  Potassium 3.5 - 5.3 mmol/L 5.3 3.8 5.2 No hemolysis seen(H)  Chloride 98 - 110 mmol/L 104 101 97  CO2 20 - 32 mmol/L 27 24 24   Calcium 8.6 - 10.4 mg/dL 9.9 8.7 9.4    Current antihypertensive regimen:  Amlodipine 10mg  daily Quinapril 40 mg daily  How often are you checking your Blood Pressure? 1-2x per week Patient was asked to check BP 2-3 times per week  Current home BP readings: 160/88, 149/87, 148/89, 159/96.  What recent interventions/DTPs have been made by any provider to improve Blood Pressure control since last CPP Visit: none  Any recent hospitalizations or ED visits since last visit with CPP? No   What diet changes have been made to improve Blood Pressure Control?  Patient states she has been trying to eat more healthy with joining [employer].  What exercise is being done to improve your Blood Pressure Control?  Patient reports walking up to 60 minutes a day on the treadmill daily. She has lost 20 pounds over the past eight months.  Adherence Review: Is the patient currently on ACE/ARB medication? Yes Quinapril 40mg  daily Does the patient have >5 day gap between last estimated fill dates? No   Follow-Up:  Pharmacist Review   , [facility] Clinical Pharmacist Assistant [phone]  7 minutes spent in review, coordination, and documentation.  Reviewed by: , PharmD Clinical Pharmacist [facility] [phone]

## 2020-06-13 ENCOUNTER — Ambulatory Visit: Payer: Self-pay | Admitting: Pharmacist

## 2020-06-13 NOTE — Chronic Care Management (AMB) (Signed)
  Chronic Care Management   Follow Up Note   06/13/2020 Name: [name] MRN: [id number] DOB: 1941-01-22  Referred by: Sandford Craze, NP Reason for referral : No chief complaint on file.   [name] is a 80 y.o. year old female who is a primary care patient of Sandford Craze, NP. The CCM team was consulted for assistance with chronic disease management and care coordination needs.    Review of patient status, including review of consultants reports, relevant laboratory and other test results, and collaboration with appropriate care team members and the patient's provider was performed as part of comprehensive patient evaluation and provision of chronic care management services.    SDOH (Social Determinants of Health) assessments performed: No See Care Plan activities for detailed interventions related to [facility])     Outpatient Encounter Medications as of 06/13/2020  Medication Sig  . amLODipine (NORVASC) 10 MG tablet Take 1 tablet (10 mg total) by mouth daily.  . Ascorbic Acid (VITAMIN C) 1000 MG tablet Take 1,000 mg by mouth daily.  [name] aspirin (ASPIRIN 81) 81 MG EC tablet Take 1 tablet (81 mg total) by mouth daily. Swallow whole.  . Calcium Carbonate-Vitamin D 600-400 MG-UNIT tablet Take 1 tablet by mouth 2 (two) times daily.  . carboxymethylcellulose (REFRESH PLUS) 0.5 % SOLN Place 1 drop into both eyes 2 (two) times daily.  . Coenzyme Q10 200 MG capsule Take 200 mg by mouth daily.  [name] ezetimibe (ZETIA) 10 MG tablet TAKE 1 TABLET BY MOUTH ONCE DAILY  . ibuprofen (ADVIL,MOTRIN) 200 MG tablet Take 800 mg by mouth daily.  . Multiple Vitamin (MULTIVITAMIN) tablet Take 1 tablet by mouth daily.  . niacin 500 MG tablet Take 500 mg by mouth at bedtime.  . Omega-3 Fatty Acids (FISH OIL) 1000 MG CAPS Take 1 capsule by mouth every morning.  [name] omeprazole (PRILOSEC) 20 MG capsule TAKE 1 CAPSULE BY MOUTH ONCE DAILY, AND 1 CAPSULE AS NEEDED. MAX OF 2 CAPSULES PER DAY  .  quinapril (ACCUPRIL) 40 MG tablet TAKE 1 TABLET BY MOUTH AT BEDTIME  . vitamin E 180 MG (400 UNITS) capsule Take 400 Units by mouth daily.   No facility-administered encounter medications on file as of 06/13/2020.     Objective:   Chart review to analyze care gaps and adherence date for star measures.  Goals Addressed   None     There are no care plans to display for this patient.  Care Gaps: - BP > 140/90  Adherence: - Patient is 100% adherent with HTN (Quinapril 40mg ) with 0 days missed - Continue current medication management strategy  Plan:   The patient has been provided with contact information for the care management team and has been advised to call with any health related questions or concerns.    , PharmD Clinical Pharmacist [facility] [phone]

## 2020-06-28 ENCOUNTER — Other Ambulatory Visit: Payer: Self-pay | Admitting: Family

## 2020-06-28 MED FILL — QUINAPRIL HCL 40 MG TABS: 40 | 90 days supply | Qty: 90 | Fill #0

## 2020-06-28 MED FILL — AMLODIPINE BESYLATE 10 MG T: 10 | 90 days supply | Qty: 90 | Fill #1

## 2020-06-30 ENCOUNTER — Other Ambulatory Visit: Payer: Self-pay | Admitting: Family

## 2020-06-30 MED FILL — EZETIMIBE 10 MG TABS: 10 | 90 days supply | Qty: 90 | Fill #0

## 2020-07-07 ENCOUNTER — Telehealth: Payer: Self-pay | Admitting: Pharmacist

## 2020-07-07 NOTE — Progress Notes (Addendum)
Chronic Care Management Pharmacy Assistant   Name: RENIKA SHIFLET  MRN: [id number] DOB: 06/19/1940  Reason for Encounter: Disease State For HTN.  Patient Questions:  1.  Have you seen any other providers since your last visit? No.   2.  Any changes in your medicines or health? No.  PCP : Sandford Craze, NP  Office Visits: None since 06/13/20  Consults: None since 06/13/20  Allergies:   Allergies  Allergen Reactions   Atorvastatin Other (See Comments)    REACTION: MUSCLE PAINS   Diclofenac-Misoprostol Diarrhea and Nausea And Vomiting   Fenofibrate Other (See Comments)    REACTION: carpopedal spasms   Rosuvastatin Other (See Comments)    REACTION: MUSCLE PAINS   Statins Other (See Comments)    myalgias   Amoxicillin-Pot Clavulanate Other (See Comments)    REACTION: unspecified   Penicillins Hives   Sulfonamide Derivatives Hives   Tuberculin Ppd Other (See Comments)    Redness at sight    Medications: Outpatient Encounter Medications as of 07/07/2020  Medication Sig   amLODipine (NORVASC) 10 MG tablet Take 1 tablet (10 mg total) by mouth daily.   Ascorbic Acid (VITAMIN C) 1000 MG tablet Take 1,000 mg by mouth daily.   aspirin (ASPIRIN 81) 81 MG EC tablet Take 1 tablet (81 mg total) by mouth daily. Swallow whole.   Calcium Carbonate-Vitamin D 600-400 MG-UNIT tablet Take 1 tablet by mouth 2 (two) times daily.   carboxymethylcellulose (REFRESH PLUS) 0.5 % SOLN Place 1 drop into both eyes 2 (two) times daily.   Coenzyme Q10 200 MG capsule Take 200 mg by mouth daily.   ezetimibe (ZETIA) 10 MG tablet TAKE 1 TABLET BY MOUTH ONCE DAILY   ibuprofen (ADVIL,MOTRIN) 200 MG tablet Take 800 mg by mouth daily.   Multiple Vitamin (MULTIVITAMIN) tablet Take 1 tablet by mouth daily.   niacin 500 MG tablet Take 500 mg by mouth at bedtime.   Omega-3 Fatty Acids (FISH OIL) 1000 MG CAPS Take 1 capsule by mouth every morning.   omeprazole (PRILOSEC) 20 MG capsule TAKE 1 CAPSULE BY  MOUTH ONCE DAILY, AND 1 CAPSULE AS NEEDED. MAX OF 2 CAPSULES PER DAY   quinapril (ACCUPRIL) 40 MG tablet TAKE 1 TABLET BY MOUTH AT BEDTIME   vitamin E 180 MG (400 UNITS) capsule Take 400 Units by mouth daily.   No facility-administered encounter medications on file as of 07/07/2020.    Current Diagnosis: Patient Active Problem List   Diagnosis Date Noted   Preventative health care 12/14/2014   Hyponatremia 02/20/2014   Hypokalemia 02/20/2014   Hyperglycemia 11/17/2013   Need for prophylactic vaccination and inoculation against influenza 02/08/2012   HYPERCHOLESTEROLEMIA 04/01/2008   BARRETTS ESOPHAGUS 04/01/2008   OSTEOARTHRITIS 04/01/2008   URINARY INCONTINENCE 04/01/2008   TOBACCO ABUSE, HX OF 04/01/2008   GERD 09/24/2007   Essential hypertension 04/01/2007   Osteoporosis 05/31/2006   COLONIC POLYPS, HX OF 05/31/2006   NEPHROLITHIASIS, HX OF 05/31/2006    Goals Addressed   None    Reviewed chart prior to disease state call. Spoke with patient regarding BP  Recent Office Vitals: BP Readings from Last 3 Encounters:  05/02/20 136/64  04/04/20 (!) 155/64  03/21/20 (!) 158/55   Pulse Readings from Last 3 Encounters:  05/02/20 75  04/04/20 79  03/21/20 77    Wt Readings from Last 3 Encounters:  05/02/20 171 lb (77.6 kg)  04/04/20 171 lb (77.6 kg)  03/21/20 173 lb (78.5 kg)  Kidney Function Lab Results  Component Value Date/Time   CREATININE 0.72 02/01/2020 08:34 AM   CREATININE 0.67 02/03/2019 07:43 AM   CREATININE 0.74 01/20/2019 09:48 AM   CREATININE 0.71 01/17/2018 02:06 PM   GFR 85.17 02/03/2019 07:43 AM   GFRNONAA 82 (L) 02/17/2014 05:45 AM   GFRNONAA 84 11/17/2013 09:15 AM   GFRAA >90 02/17/2014 05:45 AM   GFRAA >89 11/17/2013 09:15 AM    BMP Latest Ref Rng & Units 02/01/2020 02/03/2019 01/20/2019  Glucose 65 - 99 mg/dL 962(I) 96 297(L)  BUN 7 - 25 mg/dL 10 9 14   Creatinine 0.60 - 0.93 mg/dL 8.92 1.19  BUN/Creat Ratio 6 - 22 (calc) NOT  APPLICABLE - -  Sodium 135 - 146 mmol/L 139 134(L) 130(L)  Potassium 3.5 - 5.3 mmol/L 5.3 3.8 5.2 No hemolysis seen(H)  Chloride 98 - 110 mmol/L 104 101 97  CO2 20 - 32 mmol/L 27 24 24   Calcium 8.6 - 10.4 mg/dL 9.9 8.7 9.4    Current antihypertensive regimen:  Amlodipine 10 mg daily Quinapril 40 mg daily  What recent interventions/DTPs have been made by any provider to improve Blood Pressure control since last CPP Visit: None.  Any recent hospitalizations or ED visits since last visit with CPP? No.  Adherence Review: Is the patient currently on ACE/ARB medication?  Yes, Quinapril 40 mg.  Does the patient have >5 day gap between last estimated fill dates? No   Third unsuccessful telephone outreach was attempted today. The patient was referred to the pharmacist for assistance with care management and care coordination.   Follow-Up:  Pharmacist Review   04-18-1978, RMA Clinical Pharmacist Assistant [phone]  5 minutes spent in review, coordination, and documentation.  Reviewed by: Hulen Luster, PharmD Clinical Pharmacist [facility] [phone]

## 2020-07-11 ENCOUNTER — Telehealth: Payer: Self-pay | Admitting: Pharmacist

## 2020-07-11 NOTE — Progress Notes (Addendum)
Chronic Care Management Pharmacy Assistant   Name: MONET NORTH  MRN: [id number] DOB: [birth year]  Reason for Encounter: Disease State For HTN  Patient Questions:  1.  Have you seen any other providers since your last visit? No.   2.  Any changes in your medicines or health? No.    PCP : Sandford Craze, NP   Office Visits: None since 07/07/20  Consults: None since 07/07/20  Allergies:   Allergies  Allergen Reactions   Atorvastatin Other (See Comments)    REACTION: MUSCLE PAINS   Diclofenac-Misoprostol Diarrhea and Nausea And Vomiting   Fenofibrate Other (See Comments)    REACTION: carpopedal spasms   Rosuvastatin Other (See Comments)    REACTION: MUSCLE PAINS   Statins Other (See Comments)    myalgias   Amoxicillin-Pot Clavulanate Other (See Comments)    REACTION: unspecified   Penicillins Hives   Sulfonamide Derivatives Hives   Tuberculin Ppd Other (See Comments)    Redness at sight    Medications: Outpatient Encounter Medications as of 07/11/2020  Medication Sig   amLODipine (NORVASC) 10 MG tablet Take 1 tablet (10 mg total) by mouth daily.   Ascorbic Acid (VITAMIN C) 1000 MG tablet Take 1,000 mg by mouth daily.   aspirin (ASPIRIN 81) 81 MG EC tablet Take 1 tablet (81 mg total) by mouth daily. Swallow whole.   Calcium Carbonate-Vitamin D 600-400 MG-UNIT tablet Take 1 tablet by mouth 2 (two) times daily.   carboxymethylcellulose (REFRESH PLUS) 0.5 % SOLN Place 1 drop into both eyes 2 (two) times daily.   Coenzyme Q10 200 MG capsule Take 200 mg by mouth daily.   ezetimibe (ZETIA) 10 MG tablet TAKE 1 TABLET BY MOUTH ONCE DAILY   ibuprofen (ADVIL,MOTRIN) 200 MG tablet Take 800 mg by mouth daily.   Multiple Vitamin (MULTIVITAMIN) tablet Take 1 tablet by mouth daily.   niacin 500 MG tablet Take 500 mg by mouth at bedtime.   Omega-3 Fatty Acids (FISH OIL) 1000 MG CAPS Take 1 capsule by mouth every morning.   omeprazole (PRILOSEC) 20 MG capsule TAKE 1 CAPSULE  BY MOUTH ONCE DAILY, AND 1 CAPSULE AS NEEDED. MAX OF 2 CAPSULES PER DAY   quinapril (ACCUPRIL) 40 MG tablet TAKE 1 TABLET BY MOUTH AT BEDTIME   vitamin E 180 MG (400 UNITS) capsule Take 400 Units by mouth daily.   No facility-administered encounter medications on file as of 07/11/2020.    Current Diagnosis: Patient Active Problem List   Diagnosis Date Noted   Preventative health care 12/14/2014   Hyponatremia 02/20/2014   Hypokalemia 02/20/2014   Hyperglycemia 11/17/2013   Need for prophylactic vaccination and inoculation against influenza 02/08/2012   HYPERCHOLESTEROLEMIA 04/01/2008   BARRETTS ESOPHAGUS 04/01/2008   OSTEOARTHRITIS 04/01/2008   URINARY INCONTINENCE 04/01/2008   TOBACCO ABUSE, HX OF 04/01/2008   GERD 09/24/2007   Essential hypertension 04/01/2007   Osteoporosis 05/31/2006   COLONIC POLYPS, HX OF 05/31/2006   NEPHROLITHIASIS, HX OF 05/31/2006    Goals Addressed   None    Reviewed chart prior to disease state call. Spoke with patient regarding BP  Recent Office Vitals: BP Readings from Last 3 Encounters:  05/02/20 136/64  04/04/20 (!) 155/64  03/21/20 (!) 158/55   Pulse Readings from Last 3 Encounters:  05/02/20 75  04/04/20 79  03/21/20 77    Wt Readings from Last 3 Encounters:  05/02/20 171 lb (77.6 kg)  04/04/20 171 lb (77.6 kg)  03/21/20 173 lb (78.5 kg)  Kidney Function Lab Results  Component Value Date/Time   CREATININE 0.72 02/01/2020 08:34 AM   CREATININE 0.67 02/03/2019 07:43 AM   CREATININE 0.74 01/20/2019 09:48 AM   CREATININE 0.71 01/17/2018 02:06 PM   GFR 85.17 02/03/2019 07:43 AM   GFRNONAA 82 (L) 02/17/2014 05:45 AM   GFRNONAA 84 11/17/2013 09:15 AM   GFRAA >90 02/17/2014 05:45 AM   GFRAA >89 11/17/2013 09:15 AM    BMP Latest Ref Rng & Units 02/01/2020 02/03/2019 01/20/2019  Glucose 65 - 99 mg/dL 295(A) 96 213(Y)  BUN 7 - 25 mg/dL 10 9 14   Creatinine 0.60 - 0.93 mg/dL 8.65 7.84  BUN/Creat Ratio 6 - 22 (calc) NOT  APPLICABLE - -  Sodium 135 - 146 mmol/L 139 134(L) 130(L)  Potassium 3.5 - 5.3 mmol/L 5.3 3.8 5.2 No hemolysis seen(H)  Chloride 98 - 110 mmol/L 104 101 97  CO2 20 - 32 mmol/L 27 24 24   Calcium 8.6 - 10.4 mg/dL 9.9 8.7 9.4    Current antihypertensive regimen:  Amlodipine 10 mg daily Quinapril 40 mg daily  How often are you checking your Blood Pressure? Patient stated 1-2x per week   Current home BP readings: 133/71 07/11/19  What recent interventions/DTPs have been made by any provider to improve Blood Pressure control since last CPP Visit: None  Any recent hospitalizations or ED visits since last visit with CPP? Patient stated no.  What diet changes have been made to improve Blood Pressure Control?  Patient stated she eats oatmeal most mornings. She stated she eats stir fry chicken with vegetables, beans with rice, and some times just vegetables. Patient stated she drinks 8 cups of water a day or more.  What exercise is being done to improve your Blood Pressure Control?  Patient stated 20-30 min walking on the treadmill daily. She stated she lost a total of 15 pounds.  Adherence Review: Is the patient currently on ACE/ARB medication? No.  Does the patient have >5 day gap between last estimated fill dates? No  Patient returned my call. She stated she did not have any questions or concerns about her medication at this time.   Follow-Up:  Pharmacist Review   04-18-1978, RMA Clinical Pharmacist Assistant [phone]  5 minutes spent in review, coordination, and documentation.  Reviewed by: Hulen Luster, PharmD Clinical Pharmacist [facility] [phone]

## 2020-07-11 NOTE — Progress Notes (Signed)
    Chronic Care Management Pharmacy Assistant   Name: [name]  MRN: [id number] DOB: October 13, 1940  Reason for Encounter: Adherence Review  PCP : [name], NP  Verified Adherence Gap Information. Per insurance data, the patient is 100% compliant with the HTN (Quinapril)  Medication. Per insurance data patient has met their wellness bundle as well as their annual wellness visit screening .Their most recent A1C was 5.6 on 07/21/19 and their most recent blood pressure was 140/82 on 02/01/20. The patient did not reach their blood pressure goal by keeping their blood pressure below 140/90. The patiens total gaps-all measures are equal to 1.  Follow-Up:  Pharmacist Review   Charlann Lange, [place] Pharmacist Assistant [phone]

## 2020-07-22 NOTE — Progress Notes (Incomplete)
Chronic Care Management Pharmacy Note  07/22/2020 Name:  [name] MRN:  [id number] DOB:  Nov 21, 1940  Subjective: [name] is an 80 y.o. year old female who is a primary patient of [name], NP.  The CCM team was consulted for assistance with disease management and care coordination needs.    Engaged with patient by telephone for follow up visit in response to provider referral for pharmacy case management and/or care coordination services.   Consent to Services:  The patient was given the following information about Chronic Care Management services today, agreed to services, and gave verbal consent: 1. CCM service includes personalized support from designated clinical staff supervised by the primary care provider, including individualized plan of care and coordination with other care providers 2. 24/7 contact phone numbers for assistance for urgent and routine care needs. 3. Service will only be billed when office clinical staff spend 20 minutes or more in a month to coordinate care. 4. Only one practitioner may furnish and bill the service in a calendar month. 5.The patient may stop CCM services at any time (effective at the end of the month) by phone call to the office staff. 6. The patient will be responsible for cost sharing (co-pay) of up to 20% of the service fee (after annual deductible is met). Patient agreed to services and consent obtained.  Patient Care Team: [name], NP as PCP - General Olevia Perches Lowella Bandy, MD (Inactive) as Consulting Physician (Gastroenterology) Suella Broad, MD as Consulting Physician (Physical Medicine and Rehabilitation) Day, Melvenia Beam, [facility] (Inactive) as Pharmacist (Pharmacist)  Recent office visits: 05/02/20 Inda [place]) - Regular follow up, no medication changes  04/04/20: Visit w/ [name], NP - Increase amlodipine to 71m daily. RTC 1 month Recent consult visits: None Since last CCM visit  Hospital  visits: None in previous 6 months  Objective:  Lab Results  Component Value Date   CREATININE 0.72 02/01/2020   BUN 10 02/01/2020   GFR 85.17 02/03/2019   GFRNONAA 82 (L) 02/17/2014   GFRAA >90 02/17/2014   NA 139 02/01/2020   K 5.3 02/01/2020   CALCIUM 9.9 02/01/2020   CO2 27 02/01/2020    Lab Results  Component Value Date/Time   HGBA1C 5.6 07/21/2019 09:27 AM   HGBA1C 5.5 01/17/2018 02:06 PM   GFR 85.17 02/03/2019 07:43 AM   GFR 75.95 01/20/2019 09:48 AM    Last diabetic Eye exam: No results found for: HMDIABEYEEXA  Last diabetic Foot exam: No results found for: HMDIABFOOTEX   Lab Results  Component Value Date   CHOL 267 (H) 02/01/2020   HDL 59 02/01/2020   LDLCALC 170 (H) 02/01/2020   LDLDIRECT 158.8 06/10/2008   TRIG 212 (H) 02/01/2020   CHOLHDL 4.5 02/01/2020    Hepatic Function Latest Ref Rng & Units 02/01/2020 01/20/2019 07/23/2018  Total Protein 6.1 - 8.1 g/dL 6.5 6.4 6.5  Albumin 3.5 - 5.2 g/dL - 4.3 4.3  AST 10 - 35 U/L '19 22 18  ' ALT 6 - 29 U/L '22 28 20  ' Alk Phosphatase 39 - 117 U/L - 61 74  Total Bilirubin 0.2 - 1.2 mg/dL 0.4 0.5 0.4  Bilirubin, Direct 0.0 - 0.3 mg/dL - - -    Lab Results  Component Value Date/Time   TSH 2.25 06/10/2008 08:23 AM   TSH 1.47 04/01/2007 12:00 AM    CBC Latest Ref Rng & Units 11/18/2015 02/26/2014 02/17/2014  WBC 4.0 - 10.5 K/uL 9.8 10.0 18.3(H)  Hemoglobin 12.0 -  15.0 g/dL 14.3 13.0 12.1  Hematocrit 36.0 - 46.0 % 42.9 40.0 35.5(L)  Platelets 150.0 - 400.0 K/uL 370.0 623.0(H) 344    Lab Results  Component Value Date/Time   VD25OH 48 08/27/2012 08:48 AM    Clinical ASCVD: {YES/NO:21197} The 10-year ASCVD risk score Mikey Bussing DC Jr., et al., 2013) is: 55.5%   Values used to calculate the score:     Age: 47 years     Sex: Female     Is Non-Hispanic African American: No     Diabetic: Yes     Tobacco smoker: No     Systolic Blood Pressure: 725 mmHg     Is BP treated: Yes     HDL Cholesterol: 59 mg/dL     Total  Cholesterol: 267 mg/dL    Depression screen [facility] 2/9 08/27/2019 06/04/2019 01/17/2018  Decreased Interest 0 0 0  Down, Depressed, Hopeless 0 0 0  PHQ - 2 Score 0 0 0  Altered sleeping - 0 1  Tired, decreased energy - 0 1  Change in appetite - 0 0  Feeling bad or failure about yourself  - 0 0  Trouble concentrating - 0 0  Moving slowly or fidgety/restless - 0 0  Suicidal thoughts - 0 0  PHQ-9 Score - 0 2  Difficult doing work/chores - Not difficult at all Not difficult at all     ***Other: (CHADS2VASc if Afib, MMRC or CAT for COPD, ACT, DEXA)  Social History   Tobacco Use  Smoking Status Former Smoker  . Years: 15.00  . Quit date: 05/15/1967  . Years since quitting: 53.2  Smokeless Tobacco Never Used   BP Readings from Last 3 Encounters:  05/02/20 136/64  04/04/20 (!) 155/64  03/21/20 (!) 158/55   Pulse Readings from Last 3 Encounters:  05/02/20 75  04/04/20 79  03/21/20 77   Wt Readings from Last 3 Encounters:  05/02/20 171 lb (77.6 kg)  04/04/20 171 lb (77.6 kg)  03/21/20 173 lb (78.5 kg)    Assessment/Interventions: Review of patient past medical history, allergies, medications, health status, including review of consultants reports, laboratory and other test data, was performed as part of comprehensive evaluation and provision of chronic care management services.   SDOH:  (Social Determinants of Health) assessments and interventions performed: {yes/no:20286}   CCM Care Plan  Allergies  Allergen Reactions  . Atorvastatin Other (See Comments)    REACTION: MUSCLE PAINS  . Diclofenac-Misoprostol Diarrhea and Nausea And Vomiting  . Fenofibrate Other (See Comments)    REACTION: carpopedal spasms  . Rosuvastatin Other (See Comments)    REACTION: MUSCLE PAINS  . Statins Other (See Comments)    myalgias  . Amoxicillin-Pot Clavulanate Other (See Comments)    REACTION: unspecified  . Penicillins Hives  . Sulfonamide Derivatives Hives  . Tuberculin Ppd Other (See  Comments)    Redness at sight    Medications Reviewed Today    Reviewed by Jiles Prows, CMA (Certified Medical Assistant) on 05/02/20 at 22  Med List Status: <None>  Medication Order Taking? Sig Documenting Provider Last Dose Status Informant  amLODipine (NORVASC) 10 MG tablet [id number] Yes Take 1 tablet (10 mg total) by mouth daily. [name], NP Taking Active   Ascorbic Acid (VITAMIN C) 1000 MG tablet [id number] Yes Take 1,000 mg by mouth daily. [provider] Taking Active Self  aspirin 325 MG tablet [id number] Yes Take 325 mg by mouth daily. [provider] Taking Active Self  Med Note De Blanch   Fri Apr 29, 2020  9:53 AM) Recommended pt to move to 69m and she is agreeable  Calcium Carbonate-Vitamin D 600-400 MG-UNIT tablet [id number] Take 1 tablet by mouth 2 (two) times daily. [provider] Taking Active Self  carboxymethylcellulose (REFRESH PLUS) 0.5 % SOLN [id number] Place 1 drop into both eyes 2 (two) times daily. [provider] Taking Active Self  Coenzyme Q10 200 MG capsule [id number] Take 200 mg by mouth daily. [provider] Taking Active Self  ezetimibe (ZETIA) 10 MG tablet [id number] TAKE 1 TABLET BY MOUTH ONCE DAILY ODebbrah Alar NP Taking Active   ibuprofen (ADVIL,MOTRIN) 200 MG tablet [id number] Take 800 mg by mouth daily. [provider] Taking Active   Multiple Vitamin (MULTIVITAMIN) tablet [id number] Take 1 tablet by mouth daily. [provider] Taking Active Self  niacin 500 MG tablet [id number] Take 500 mg by mouth at bedtime. [provider] Taking Active Self           Med Note (De Blanch  Fri Apr 29, 2020  9:20 AM)    Omega-3 Fatty Acids (FISH OIL) 1000 MG CAPS [id number] Take 1 capsule by mouth every morning. [provider] Taking Active Self  omeprazole (PRILOSEC) 20 MG capsule [id number] TAKE 1 CAPSULE BY MOUTH ONCE DAILY,  AND 1 CAPSULE AS NEEDED. MAX OF 2 CAPSULES PER DAY ODebbrah Alar NP Taking Active   quinapril (ACCUPRIL) 40 MG tablet [id number] TAKE 1 TABLET BY MOUTH AT BEDTIME ODebbrah Alar NP Taking Active   vitamin E 180 MG (400 UNITS) capsule [id number] Take 400 Units by mouth daily. [provider] Taking Active Self          Patient Active Problem List   Diagnosis Date Noted  . Preventative health care 12/14/2014  . Hyponatremia 02/20/2014  . Hypokalemia 02/20/2014  . Hyperglycemia 11/17/2013  . Need for prophylactic vaccination and inoculation against influenza 02/08/2012  . HYPERCHOLESTEROLEMIA 04/01/2008  . BARRETTS ESOPHAGUS 04/01/2008  . OSTEOARTHRITIS 04/01/2008  . URINARY INCONTINENCE 04/01/2008  . TOBACCO ABUSE, HX OF 04/01/2008  . GERD 09/24/2007  . Essential hypertension 04/01/2007  . Osteoporosis 05/31/2006  . COLONIC POLYPS, HX OF 05/31/2006  . NEPHROLITHIASIS, HX OF 05/31/2006    Immunization History  Administered Date(s) Administered  . Fluad Quad(high Dose 65+) 02/25/2019, 02/01/2020  . Influenza Split 04/18/2011, 02/08/2012  . Influenza Whole 04/01/2007, 02/02/2008, 01/31/2009, 02/20/2010  . Influenza, High Dose Seasonal PF 04/01/2018  . Influenza,inj,Quad PF,6+ Mos 02/11/2014, 01/28/2015, 03/27/2016  . Influenza-Unspecified 03/14/2013, 03/05/2017  . PFIZER(Purple Top)SARS-COV-2 Vaccination 06/03/2019, 06/24/2019, 03/03/2020  . Pneumococcal Conjugate-13 03/23/2015  . Pneumococcal Polysaccharide-23 07/24/2006, 02/25/2019  . Td 03/19/2002, 05/14/2006  . Zoster Recombinat (Shingrix) 04/02/2017, 06/02/2017    Conditions to be addressed/monitored:  {USCCMDZASSESSMENTOPTIONS:23563}  There are no care plans that you recently modified to display for this patient.    Medication Assistance: {MEDASSISTANCEINFO:25044}  [facility] [facility]  [place] [place]Phone: [phone]: [phone] [place]Phone: [phone]: [phone] Uses pill box? {Yes or If no, why not?:20788} Pt endorses ***% compliance  We discussed: {Pharmacy options:24294} [facility] and Follow Up Patient Decision:  {  ETUYWSBB:79536}  Plan: {CM FOLLOW UP PLAN:25073}  ***  Current Barriers:  . {pharmacybarriers:24917} . ***  Pharmacist Clinical Goal(s):  [name] Over the next *** days, patient will {PHARMACYGOALCHOICES:24921} through collaboration with PharmD and provider.  . ***  Interventions: . 1:1 collaboration with [name], NP regarding development and update of comprehensive plan of care as evidenced by provider attestation and co-signature . Inter-disciplinary care team collaboration (see longitudinal plan of care) . Comprehensive medication review performed; medication list updated in electronic medical record  Hypertension (BP goal {CHL HP UPSTREAM Pharmacist BP ranges:[phone]}) -{US controlled/uncontrolled:25276} -Current treatment: . *** -Medications previously tried: ***  -Current home readings: *** -Current dietary habits: *** -Current exercise habits: *** -{ACTIONS;DENIES/REPORTS:21021675::"Denies"} hypotensive/hypertensive symptoms -Educated on {CCM BP Counseling:25124} -Counseled to monitor BP at home ***, document, and provide log at future appointments -{CCMPHARMDINTERVENTION:25122}  Hyperlipidemia: (LDL goal < ***) -{US controlled/uncontrolled:25276} -Current treatment: . *** -Medications previously tried: ***  -Current dietary patterns: *** -Current exercise habits: *** -Educated on {CCM HLD Counseling:25126} -{CCMPHARMDINTERVENTION:25122}  Osteoporosis / Osteopenia (Goal ***) -{US controlled/uncontrolled:25276} -Last  DEXA Scan: ***   T-Score femoral neck: ***  T-Score total hip: ***  T-Score lumbar spine: ***  T-Score forearm radius: ***  10-year probability of major osteoporotic fracture: ***  10-year probability of hip fracture: *** -Patient {is;is not an osteoporosis candidate:23886} -Current treatment  . *** -Medications previously tried: ***  -{Osteoporosis Counseling:23892} -{CCMPHARMDINTERVENTION:25122}  GERD/Barrett's Esophagus (Goal: ***) -{US controlled/uncontrolled:25276} -Current treatment  . *** -Medications previously tried: ***  -{CCMPHARMDINTERVENTION:25122}   Patient Goals/Self-Care Activities . Over the next *** days, patient will:  - {pharmacypatientgoals:24919}  Follow Up Plan: {CM FOLLOW UP VQOH:00979}

## 2020-07-28 ENCOUNTER — Ambulatory Visit (INDEPENDENT_AMBULATORY_CARE_PROVIDER_SITE_OTHER): Payer: PPO | Admitting: Pharmacist

## 2020-07-28 ENCOUNTER — Encounter: Payer: Self-pay | Admitting: Pharmacist

## 2020-07-28 ENCOUNTER — Other Ambulatory Visit: Payer: Self-pay

## 2020-07-28 DIAGNOSIS — I10 Essential (primary) hypertension: Secondary | ICD-10-CM

## 2020-07-28 DIAGNOSIS — Z0000 Encounter for general adult medical examination without abnormal findings: Secondary | ICD-10-CM

## 2020-07-28 DIAGNOSIS — E785 Hyperlipidemia, unspecified: Secondary | ICD-10-CM

## 2020-07-28 DIAGNOSIS — M810 Age-related osteoporosis without current pathological fracture: Secondary | ICD-10-CM

## 2020-07-28 NOTE — Patient Instructions (Signed)
Current Barriers:  . Unable to achieve control of hypertension and hyperlipidemia   Pharmacist Clinical Goal(s):  [name] Over the next 90 days, patient will maintain control of blood presure as evidenced by blood presure readings less than 140/90  . adhere to prescribed medication regimen as evidenced by refil history and attainment of treatment goals through collaboration with PharmD and provider.   Interventions: . 1:1 collaboration with Sandford Craze, NP regarding development and update of comprehensive plan of care as evidenced by provider attestation and co-signature . Inter-disciplinary care team collaboration (see longitudinal plan of care) . Comprehensive medication review performed; medication list updated in electronic medical record  Hypertension: . Improving control but still some readings >140/90  current treatment:amlodipine 10mg  daily and quinapril 40mg  daily;  . Denies hypotensive/hypertensive symptoms . Counseled on continuing efforts to limit sodium intake and decrease weight. Continue phyiscal activity.    Hyperlipidemia and hypertriglyceridemia . Uncontrolled; current treatment:naicin 500mg  daily, Krill Oil daily and ezetimibe 10mg  daily;  Medications previously tried: atorvastatin and rosuvastatin - both caused severe myalgias and affected ability to walk; fenofibrate  . Current dietary patterns: limiting serving sizes . Educated on LDL and triglyceride goals;  Assessed dietary and exercise compliance; patient is meeting dietary and physical activity goals.   Osteoporosis: . Current treatment: Prolia 60mg  every 6 months - next dose due around 08/08/2020;  . Last DEXA: 07/2018,lowest T-score -3.6 at right forearm radium . Supplementation: calcium carbonate 600mg  twice daily and vitamin D 5000 IU daily;  Consider checking serum vitamin D (if covered by insurance) since taking high dose vitamin D.   Consider rechecking DEXA.    Patient Goals/Self-Care  Activities . Over the next 90 days, patient will:  take medications as prescribed and check blood pressure weekly, document, and provide at future appointments  Follow Up Plan: Telephone follow up appointment with care management team member scheduled for:  3 months

## 2020-07-28 NOTE — Progress Notes (Addendum)
I have personally reviewed this encounter including the documentation in this note and have collaborated with the care management provider regarding care management and care coordination activities to include development and update of the comprehensive care plan. I am certifying that I agree with the content of this note and encounter as supervising provider.  [name] O'Sullivan NP  

## 2020-07-28 NOTE — Chronic Care Management (AMB) (Signed)
Chronic Care Management Pharmacy Note  07/28/2020 Name:  [name] MRN:  [id number] DOB:  1940-10-03  Subjective: [name] is an 80 y.o. year old female who is a primary patient of [name], NP.  The CCM team was consulted for assistance with disease management and care coordination needs.    Engaged with patient by telephone for follow up visit in response to provider referral for pharmacy case management and/or care coordination services.   Consent to Services:  The patient was given information about Chronic Care Management services, agreed to services, and gave verbal consent prior to initiation of services.  Please see initial visit note for detailed documentation.   Patient Care Team: [name], NP as PCP - General Olevia Perches Lowella Bandy, MD (Inactive) as Consulting Physician (Gastroenterology) Suella Broad, MD as Consulting Physician (Physical Medicine and Rehabilitation) Cherre Robins, PharmD (Pharmacist)  Recent office visits: 05/02/20 [facility]) - Regular follow up, no medication changes  04/04/20:Visit w/ [name], NP -Increase amlodipine to 83m daily. RTC 1 month  Recent consult visits: None since last CCM visit  Hospital visits: None in previous 6 months  Objective:  Lab Results  Component Value Date   CREATININE 0.72 02/01/2020   CREATININE 0.67 02/03/2019   CREATININE 0.74 01/20/2019    Lab Results  Component Value Date   HGBA1C 5.6 07/21/2019   Last diabetic Eye exam: No results found for: HMDIABEYEEXA  Last diabetic Foot exam: No results found for: HMDIABFOOTEX      Component Value Date/Time   CHOL 267 (H) 02/01/2020 0834   TRIG 212 (H) 02/01/2020 0834   HDL 59 02/01/2020 0834   CHOLHDL 4.5 02/01/2020 0834   VLDL 26.0 01/20/2019 0948   LDLCALC 170 (H) 02/01/2020 0834   LDLDIRECT 158.8 06/10/2008 0823    Hepatic Function Latest Ref Rng & Units 02/01/2020 01/20/2019 07/23/2018  Total Protein 6.1 -  8.1 g/dL 6.5 6.4 6.5  Albumin 3.5 - 5.2 g/dL - 4.3 4.3  AST 10 - 35 U/L '19 22 18  ' ALT 6 - 29 U/L '22 28 20  ' Alk Phosphatase 39 - 117 U/L - 61 74  Total Bilirubin 0.2 - 1.2 mg/dL 0.4 0.5 0.4  Bilirubin, Direct 0.0 - 0.3 mg/dL - - -    Lab Results  Component Value Date/Time   TSH 2.25 06/10/2008 08:23 AM   TSH 1.47 04/01/2007 12:00 AM    CBC Latest Ref Rng & Units 11/18/2015 02/26/2014 02/17/2014  WBC 4.0 - 10.5 K/uL 9.8 10.0 18.3(H)  Hemoglobin 12.0 - 15.0 g/dL 14.3 13.0 12.1  Hematocrit 36.0 - 46.0 % 42.9 40.0 35.5(L)  Platelets 150.0 - 400.0 K/uL 370.0 623.0(H) 344    Lab Results  Component Value Date/Time   VD25OH 48 08/27/2012 08:48 AM    Clinical ASCVD: No  The 10-year ASCVD risk score (Mikey BussingDC Jr., et al., 2013) is: 55.5%   Values used to calculate the score:     Age: [age]years     Sex: Female     Is Non-Hispanic African American: No     Diabetic: Yes     Tobacco smoker: No     Systolic Blood Pressure: 1035mmHg     Is BP treated: Yes     HDL Cholesterol: 59 mg/dL     Total Cholesterol: 267 mg/dL    Other: Last DEXA 07/23/2018 showed lowest T-Score of -3.6 at right foremar radius.  Started Prolia 645mSQ every 6 months 11/01/18.  Health Maintenance:  Patient refused Tetanus vaccine in the past due to concerns of side effects (sore arm). She might be candidate for Tdap - does not look like she has had Tdap with pertussis booster. She is potentially interested given that she has several young grandchildren.   Social History   Tobacco Use  Smoking Status Former Smoker  . Years: 15.00  . Quit date: 05/15/1967  . Years since quitting: 53.2  Smokeless Tobacco Never Used   BP Readings from Last 3 Encounters:  05/02/20 136/64  04/04/20 (!) 155/64  03/21/20 (!) 158/55   Pulse Readings from Last 3 Encounters:  05/02/20 75  04/04/20 79  03/21/20 77   Wt Readings from Last 3 Encounters:  05/02/20 171 lb (77.6 kg)  04/04/20 171 lb (77.6 kg)  03/21/20 173 lb (78.5  kg)    Assessment: Review of patient past medical history, allergies, medications, health status, including review of consultants reports, laboratory and other test data, was performed as part of comprehensive evaluation and provision of chronic care management services.   SDOH:  (Social Determinants of Health) assessments and interventions performed:    CCM Care Plan  Allergies  Allergen Reactions  . Atorvastatin Other (See Comments)    REACTION: MUSCLE PAINS  . Diclofenac-Misoprostol Diarrhea and Nausea And Vomiting  . Fenofibrate Other (See Comments)    REACTION: carpopedal spasms  . Rosuvastatin Other (See Comments)    REACTION: MUSCLE PAINS  . Statins Other (See Comments)    myalgias  . Amoxicillin-Pot Clavulanate Other (See Comments)    REACTION: unspecified  . Penicillins Hives  . Sulfonamide Derivatives Hives  . Tuberculin Ppd Other (See Comments)    Redness at sight    Medications Reviewed Today    Reviewed by Cherre Robins, PharmD (Pharmacist) on 07/28/20 at [phone]  Med List Status: <None>  Medication Order Taking? Sig Documenting Provider Last Dose Status Informant  amLODipine (NORVASC) 10 MG tablet [id number] Yes Take 1 tablet (10 mg total) by mouth daily. [name], NP Taking Active   Ascorbic Acid (VITAMIN C) 1000 MG tablet [id number] Yes Take 1,000 mg by mouth daily. [provider] Taking Active Self  aspirin (ASPIRIN 81) 81 MG EC tablet [id number] Yes Take 1 tablet (81 mg total) by mouth daily. Swallow whole. [name], NP Taking Active   Calcium Carbonate-Vitamin D 600-400 MG-UNIT tablet [id number] Yes Take 1 tablet by mouth 2 (two) times daily. [provider] Taking Active Self  carboxymethylcellulose (REFRESH PLUS) 0.5 % SOLN [id number] Yes Place 1 drop into both eyes 2 (two) times daily. [provider] Taking Active Self  Cholecalciferol (VITAMIN D) 125 MCG (5000 UT) CAPS [id number] Yes Take 1 capsule by mouth daily.  [provider] Taking Active   Coenzyme Q10 200 MG capsule [id number] Yes Take 200 mg by mouth daily. [provider] Taking Active Self  ezetimibe (ZETIA) 10 MG tablet [id number] Yes TAKE 1 TABLET BY MOUTH ONCE DAILY [name], NP Taking Active   ibuprofen (ADVIL,MOTRIN) 200 MG tablet [id number] Yes Take 400 mg by mouth daily. [provider] Taking Active   Multiple Vitamin (MULTIVITAMIN) tablet [id number] Yes Take 1 tablet by mouth daily. [provider] Taking Active Self  niacin 500 MG tablet [id number] Yes Take 500 mg by mouth at bedtime. [provider] Taking Active Self           Med Note De Blanch   Fri Apr 29, 2020  9:20 AM)    Omega-3 Fatty  Acids (FISH OIL) 1000 MG CAPS [id number] Yes Take 1 capsule by mouth every morning. [provider] Taking Active Self  omeprazole (PRILOSEC) 20 MG capsule [id number] Yes TAKE 1 CAPSULE BY MOUTH ONCE DAILY, AND 1 CAPSULE AS NEEDED. MAX OF 2 CAPSULES PER DAY [name], NP Taking Active   quinapril (ACCUPRIL) 40 MG tablet [id number] Yes TAKE 1 TABLET BY MOUTH AT BEDTIME [name], NP Taking Active   vitamin E 180 MG (400 UNITS) capsule [id number] Yes Take 400 Units by mouth daily. [provider] Taking Active Self          Patient Active Problem List   Diagnosis Date Noted  . Preventative health care 12/14/2014  . Hyponatremia 02/20/2014  . Hypokalemia 02/20/2014  . Hyperglycemia 11/17/2013  . Need for prophylactic vaccination and inoculation against influenza 02/08/2012  . HYPERCHOLESTEROLEMIA 04/01/2008  . BARRETTS ESOPHAGUS 04/01/2008  . OSTEOARTHRITIS 04/01/2008  . URINARY INCONTINENCE 04/01/2008  . TOBACCO ABUSE, HX OF 04/01/2008  . GERD 09/24/2007  . Essential hypertension 04/01/2007  . Osteoporosis 05/31/2006  . COLONIC POLYPS, HX OF 05/31/2006  . NEPHROLITHIASIS, HX OF 05/31/2006    Immunization History  Administered Date(s) Administered  .  Fluad Quad(high Dose 65+) 02/25/2019, 02/01/2020  . Influenza Split 04/18/2011, 02/08/2012  . Influenza Whole 04/01/2007, 02/02/2008, 01/31/2009, 02/20/2010  . Influenza, High Dose Seasonal PF 04/01/2018  . Influenza,inj,Quad PF,6+ Mos 02/11/2014, 01/28/2015, 03/27/2016  . Influenza-Unspecified 03/14/2013, 03/05/2017  . PFIZER(Purple Top)SARS-COV-2 Vaccination 06/03/2019, 06/24/2019, 03/03/2020  . Pneumococcal Conjugate-13 03/23/2015  . Pneumococcal Polysaccharide-23 07/24/2006, 02/25/2019  . Td 03/19/2002, 05/14/2006  . Zoster Recombinat (Shingrix) 04/02/2017, 06/02/2017    Conditions to be addressed/monitored: HTN, HLD, Hypertriglyceridemia and osteoporosis  There are no care plans that you recently modified to display for this patient.   Medication Assistance: None required.  Patient affirms current coverage meets needs.  Patient's preferred pharmacy is:  [facility], [address] [place] [address] Phone: [phone] Fax: [phone]  [place], [place] - [address] [place] [address] Phone: [phone] Fax: [phone]  Follow Up:  Patient agrees to Care Plan and Follow-up.  Plan:  HTN: Patient to continue to check BP at home weekly and document results. Continue current medications for HTN - amlodipine and quinapril  Osteoporosis:  Next Prolia injection due around 08/08/2020.  Also consider if recheck DEXA is indicated as last was 2 years ago. Consider serum vitamin D check (if insurance covers) since patient is taking OTC vitamin D 5000 units daily.  Continue weight bearing exercise.  Hyperlipidemia / hypertriglyceridemia with intolerance to statins:  Continue current therapy; Upcoming appt with PCP; will check back to see if lipids reassessed at that appt.   Health Maintenance:  Consider if Tdap would be appropriate for  patient. Patient to discuss with PCP at visit 08/01/20.     Telephone follow up appointment with care management team member scheduled for:  3 months unless labs / BP from upcoming PCP appt indicate otherwise. The patient has been provided with contact information for the care management team and has been advised to call with any health related questions or concerns. Next PCP appointment scheduled for:  08/01/20   Cherre Robins, PharmD Clinical Pharmacist [place] [facility]

## 2020-08-01 ENCOUNTER — Ambulatory Visit (INDEPENDENT_AMBULATORY_CARE_PROVIDER_SITE_OTHER): Payer: PPO | Admitting: Family

## 2020-08-01 ENCOUNTER — Other Ambulatory Visit: Payer: Self-pay

## 2020-08-01 VITALS — BP 130/72 | HR 83 | Temp 98.1°F | Resp 16 | Ht 62.0 in | Wt 172.2 lb

## 2020-08-01 DIAGNOSIS — M810 Age-related osteoporosis without current pathological fracture: Secondary | ICD-10-CM | POA: Diagnosis not present

## 2020-08-01 DIAGNOSIS — E559 Vitamin D deficiency, unspecified: Secondary | ICD-10-CM | POA: Diagnosis not present

## 2020-08-01 DIAGNOSIS — I10 Essential (primary) hypertension: Secondary | ICD-10-CM | POA: Diagnosis not present

## 2020-08-01 LAB — BASIC METABOLIC PANEL
BUN: 10 mg/dL (ref 6–23)
CO2: 25 mEq/L (ref 19–32)
Calcium: 9.8 mg/dL (ref 8.4–10.5)
Chloride: 99 mEq/L (ref 96–112)
Creatinine, Ser: 0.66 mg/dL (ref 0.40–1.20)
GFR: 83.5 mL/min (ref >60.00)
Glucose, Bld: 109 mg/dL — ABNORMAL HIGH (ref 70–99)
Potassium: 3.9 mEq/L (ref 3.5–5.1)
Sodium: 135 mEq/L (ref 135–145)

## 2020-08-01 LAB — VITAMIN D 25 HYDROXY (VIT D DEFICIENCY, FRACTURES): VITD: 49.28 ng/mL (ref 30.00–100.00)

## 2020-08-01 MED ORDER — TETANUS-DIPHTH-ACELL PERTUSSIS 5-2.5-18.5 LF-MCG/0.5 IM SUSY: 0.5000 mL | PREFILLED_SYRINGE | Freq: Once | INTRAMUSCULAR | 0 refills | Status: AC

## 2020-08-01 NOTE — Patient Instructions (Signed)
Please complete lab work prior to leaving.   

## 2020-08-01 NOTE — Progress Notes (Signed)
Subjective:    Patient ID: [name], female    DOB: 12-20-1940, 80 y.o.   MRN: [id number]  HPI  Patient presents today for follow up.   HTN- She reports that she walked on the treadmill this AM.  She continues amlodipine 10mg  once daily in the AM.   BP Readings from Last 3 Encounters:  08/01/20 130/72  05/02/20 136/64  04/04/20 (!) 155/64     Review of Systems See HPI  Past Medical History:  Diagnosis Date  . Arthritis    osteoarthritis  . Barrett's esophagus   . Cataract    bilateral cateracts removed  . Chronic kidney disease    kidney stones  . Chronic obstructive bronchitis (HCC)    Dr 04/06/20  . Colitis   . Colon polyps   . GERD (gastroesophageal reflux disease)   . History of chicken pox   . History of nephrolithiasis   . History of transfusion of packed red blood cells   . Hyperlipidemia   . Hypertension   . IFG (impaired fasting glucose)   . Osteoporosis    pt cannot tolerate fosamax  . 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  Delford Field Urinary incontinence      Social History   Socioeconomic History  . Marital status: Widowed    Spouse name: Not on file  . Number of children: Not on file  . Years of education: Not on file  . Highest education level: Not on file  Occupational History  . Occupation: Retired  Tobacco Use  . Smoking status: Former Smoker    Years: 15.00    Quit date: 05/15/1967    Years since quitting: 53.2  . Smokeless tobacco: Never Used  Substance and Sexual Activity  . Alcohol use: No  . Drug use: No  . Sexual activity: Never  Other Topics Concern  . Not on file  Social History Narrative   Regular exercise: yes   Social Determinants of Health   Financial Resource Strain: Low Risk   . Difficulty of Paying Living Expenses: Not hard at all  Food Insecurity: No Food Insecurity  . Worried About 07/13/1967 in the Last Year: Never true  . Ran Out of Food in the Last  Year: Never true  Transportation Needs: No Transportation Needs  . Lack of Transportation (Medical): No  . Lack of Transportation (Non-Medical): No  Physical Activity: Sufficiently Active  . Days of Exercise per Week: 7 days  . Minutes of Exercise per Session: 40 min  Stress: Not on file  Social Connections: Not on file  Intimate Partner Violence: Not on file    Past Surgical History:  Procedure Laterality Date  . ABDOMINAL HYSTERECTOMY  1970  . APPENDECTOMY    . CHOLECYSTECTOMY    . LEG SURGERY  2008   right  . TOTAL KNEE ARTHROPLASTY  04/2006   total left knee replacement    Family History  Problem Relation Age of Onset  . Coronary artery disease Mother   . Arthritis Mother   . Tuberculosis Other   . Asthma Maternal Grandmother   . Colon cancer Neg Hx   . Esophageal cancer Neg Hx   . Rectal cancer Neg Hx   . Stomach cancer Neg Hx     Allergies  Allergen Reactions  . Atorvastatin Other (See Comments)    REACTION: MUSCLE PAINS  . Diclofenac-Misoprostol Diarrhea and Nausea And Vomiting  .  Fenofibrate Other (See Comments)    REACTION: carpopedal spasms  . Rosuvastatin Other (See Comments)    REACTION: MUSCLE PAINS  . Statins Other (See Comments)    myalgias  . Amoxicillin-Pot Clavulanate Other (See Comments)    REACTION: unspecified  . Penicillins Hives  . Sulfonamide Derivatives Hives  . Tuberculin Ppd Other (See Comments)    Redness at sight    Current Outpatient Medications on File Prior to Visit  Medication Sig Dispense Refill  . amLODipine (NORVASC) 10 MG tablet Take 1 tablet (10 mg total) by mouth daily. 90 tablet 1  . Ascorbic Acid (VITAMIN C) 1000 MG tablet Take 1,000 mg by mouth daily.    [name] aspirin (ASPIRIN 81) 81 MG EC tablet Take 1 tablet (81 mg total) by mouth daily. Swallow whole. 30 tablet 12  . Calcium Carbonate-Vitamin D 600-400 MG-UNIT tablet Take 1 tablet by mouth 2 (two) times daily.    . carboxymethylcellulose (REFRESH PLUS) 0.5 % SOLN  Place 1 drop into both eyes 2 (two) times daily.    . Cholecalciferol (VITAMIN D) 125 MCG (5000 UT) CAPS Take 1 capsule by mouth daily.    . Coenzyme Q10 200 MG capsule Take 200 mg by mouth daily.    [name] ezetimibe (ZETIA) 10 MG tablet TAKE 1 TABLET BY MOUTH ONCE DAILY 90 tablet 1  . ibuprofen (ADVIL,MOTRIN) 200 MG tablet Take 400 mg by mouth daily.    . Multiple Vitamin (MULTIVITAMIN) tablet Take 1 tablet by mouth daily.    . niacin 500 MG tablet Take 500 mg by mouth at bedtime.    . Omega-3 Fatty Acids (FISH OIL) 1000 MG CAPS Take 1 capsule by mouth every morning.    [name] omeprazole (PRILOSEC) 20 MG capsule TAKE 1 CAPSULE BY MOUTH ONCE DAILY, AND 1 CAPSULE AS NEEDED. MAX OF 2 CAPSULES PER DAY 180 capsule 1  . quinapril (ACCUPRIL) 40 MG tablet TAKE 1 TABLET BY MOUTH AT BEDTIME 90 tablet 0  . vitamin E 180 MG (400 UNITS) capsule Take 400 Units by mouth daily.     No current facility-administered medications on file prior to visit.    BP 130/72   Pulse 83   Temp 98.1 F (36.7 C) (Oral)   Resp 16   Ht 5\' 2"  (1.575 m)   Wt 172 lb 3.2 oz (78.1 kg)   LMP 05/14/1968   SpO2 98%   BMI 31.50 kg/m       Objective:   Physical Exam Constitutional:      Appearance: She is well-developed.  Cardiovascular:     Rate and Rhythm: Normal rate and regular rhythm.     Heart sounds: Normal heart sounds. No murmur heard.   Pulmonary:     Effort: Pulmonary effort is normal. No respiratory distress.     Breath sounds: Normal breath sounds. No wheezing.  Psychiatric:        Behavior: Behavior normal.        Thought Content: Thought content normal.        Judgment: Judgment normal.           Assessment & Plan:  HTN- initial bp mildly elevated.  Follow up bp improved. Continue amloidipine 10 mg once daily. Obtain follow up bmet.  Osteopososis- will be due for prolia after 3/29. Due for dexa. Order placed.   Vit D deficiency- obtain follow up vit D level.  This visit occurred during the  SARS-CoV-2 public health emergency.  Safety protocols were in place,  including screening questions prior to the visit, additional usage of staff PPE, and extensive cleaning of exam room while observing appropriate contact time as indicated for disinfecting solutions.

## 2020-08-08 ENCOUNTER — Ambulatory Visit (HOSPITAL_BASED_OUTPATIENT_CLINIC_OR_DEPARTMENT_OTHER)
Admission: RE | Admit: 2020-08-08 | Discharge: 2020-08-08 | Disposition: A | Discharge status: Home / Self Care | Payer: PPO | Source: Ambulatory Visit | Attending: Family | Admitting: Family

## 2020-08-08 ENCOUNTER — Other Ambulatory Visit: Payer: Self-pay

## 2020-08-08 DIAGNOSIS — M810 Age-related osteoporosis without current pathological fracture: Secondary | ICD-10-CM | POA: Diagnosis not present

## 2020-08-09 ENCOUNTER — Encounter: Payer: Self-pay | Admitting: Family

## 2020-08-09 ENCOUNTER — Ambulatory Visit: Payer: PPO

## 2020-08-09 NOTE — Progress Notes (Signed)
Mailed out to pt 

## 2020-08-10 ENCOUNTER — Telehealth: Payer: Self-pay | Admitting: Pharmacist

## 2020-08-10 NOTE — Progress Notes (Addendum)
    Chronic Care Management Pharmacy Assistant   Name: [name]  MRN: [id number] DOB: 08/03/1940   Reason for Encounter: Adherence Review  Verified Adherence Gap Information. Per insurance data, the patient is 100% compliant with the HTN (Quinapril) medication. Per insurance data patient has met their wellness bundle and annual wellness screening. Their most recent A1C was 5.6 on 07/21/19. The patients total gap-all measures is equal to 1.  The most recent blood pressure was 140/82 on 02/01/20. The patient met goal with keeping her blood pressure below 140/90.   Follow-Up:Pharmacist Review  Charlann Lange, [place] Pharmacist Assistant [phone]

## 2020-08-17 ENCOUNTER — Ambulatory Visit (INDEPENDENT_AMBULATORY_CARE_PROVIDER_SITE_OTHER): Payer: PPO

## 2020-08-17 ENCOUNTER — Other Ambulatory Visit: Payer: Self-pay

## 2020-08-17 DIAGNOSIS — M810 Age-related osteoporosis without current pathological fracture: Secondary | ICD-10-CM | POA: Diagnosis not present

## 2020-08-17 MED ORDER — DENOSUMAB 60 MG/ML ~~LOC~~ SOSY
60.0000 mg | PREFILLED_SYRINGE | Freq: Once | SUBCUTANEOUS | Status: AC
Start: 2020-08-17 — End: 2020-08-17
  Administered 2020-08-17: 60 mg via SUBCUTANEOUS

## 2020-08-17 NOTE — Progress Notes (Signed)
Pt is here today for prolia injection. Pt was given prolia injection in left subq. Pt tolerated well.  Pt is scheduled to return 02/17/21

## 2020-08-31 NOTE — Progress Notes (Signed)
Subjective:   [name] is a 80 y.o. female who presents for Medicare Annual (Subsequent) preventive examination.   Review of Systems     Cardiac Risk Factors include: advanced age (>42men, >19 women);dyslipidemia;hypertension;obesity (BMI >30kg/m2)     Objective:    Today's Vitals   09/01/20 0807  BP: (!) 148/78  Pulse: 87  Resp: 16  Temp: 97.7 F (36.5 C)  TempSrc: Temporal  SpO2: 98%  Weight: 166 lb 6.4 oz (75.5 kg)  Height:  (1.575 m)   Body mass index is 30.43 kg/m.  Advanced Directives 09/01/2020 08/27/2019 09/24/2016 12/14/2014 02/10/2014  Does Patient Have a Medical Advance Directive? Yes No Yes Yes No  Type of Estate agent of [place];Living will - Healthcare Power of [place];Living will Healthcare Power of Attorney -  Does patient want to make changes to medical advance directive? - - No - Patient declined No - Patient declined -  Copy of Healthcare Power of Attorney in Chart? No - copy requested - No - copy requested No - copy requested -  Would patient like information on creating a medical advance directive? - No - Patient declined - - No - patient declined information    Current Medications (verified) Outpatient Encounter Medications as of 09/01/2020  Medication Sig  . amLODipine (NORVASC) 10 MG tablet TAKE 1 TABLET (10 MG TOTAL) BY MOUTH DAILY.  [name] Ascorbic Acid (VITAMIN C) 1000 MG tablet Take 1,000 mg by mouth daily.  [name] aspirin (ASPIRIN 81) 81 MG EC tablet Take 1 tablet (81 mg total) by mouth daily. Swallow whole.  . Calcium Carbonate-Vitamin D 600-400 MG-UNIT tablet Take 1 tablet by mouth 2 (two) times daily.  . carboxymethylcellulose (REFRESH PLUS) 0.5 % SOLN Place 1 drop into both eyes 2 (two) times daily.  . Cholecalciferol (VITAMIN D) 125 MCG (5000 UT) CAPS Take 1 capsule by mouth daily.  . Coenzyme Q10 200 MG capsule Take 200 mg by mouth daily.  [name] ezetimibe (ZETIA) 10 MG tablet TAKE 1 TABLET BY MOUTH ONCE DAILY  .  ibuprofen (ADVIL,MOTRIN) 200 MG tablet Take 400 mg by mouth daily.  . Multiple Vitamin (MULTIVITAMIN) tablet Take 1 tablet by mouth daily.  . niacin 500 MG tablet Take 500 mg by mouth at bedtime.  . Omega-3 Fatty Acids (FISH OIL) 1000 MG CAPS Take 1 capsule by mouth every morning.  [name] omeprazole (PRILOSEC) 20 MG capsule TAKE 1 CAPSULE BY MOUTH ONCE DAILY, AND 1 CAPSULE AS NEEDED. MAX OF 2 CAPSULES PER DAY  . quinapril (ACCUPRIL) 40 MG tablet TAKE 1 TABLET BY MOUTH AT BEDTIME  . vitamin E 180 MG (400 UNITS) capsule Take 400 Units by mouth daily.   No facility-administered encounter medications on file as of 09/01/2020.    Allergies (verified) Atorvastatin, Diclofenac-misoprostol, Fenofibrate, Rosuvastatin, Statins, Amoxicillin-pot clavulanate, Penicillins, Sulfonamide derivatives, and Tuberculin ppd   History: Past Medical History:  Diagnosis Date  . Arthritis    osteoarthritis  . Barrett's esophagus   . Cataract    bilateral cateracts removed  . Chronic kidney disease    kidney stones  . Chronic obstructive bronchitis (HCC)    Dr Delford Field  . Colitis   . Colon polyps   . GERD (gastroesophageal reflux disease)   . History of chicken pox   . History of nephrolithiasis   . History of transfusion of packed red blood cells   . Hyperlipidemia   . Hypertension   . IFG (impaired fasting glucose)   . Osteoporosis  pt cannot tolerate fosamax  . 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  [name] Urinary incontinence    Past Surgical History:  Procedure Laterality Date  . ABDOMINAL HYSTERECTOMY  1970  . APPENDECTOMY    . CHOLECYSTECTOMY    . LEG SURGERY  2008   right  . TOTAL KNEE ARTHROPLASTY  04/2006   total left knee replacement   Family History  Problem Relation Age of Onset  . Coronary artery disease Mother   . Arthritis Mother   . Tuberculosis Other   . Asthma Maternal Grandmother   . Colon cancer Neg Hx   . Esophageal cancer Neg Hx    . Rectal cancer Neg Hx   . Stomach cancer Neg Hx    Social History   Socioeconomic History  . Marital status: Widowed    Spouse name: Not on file  . Number of children: Not on file  . Years of education: Not on file  . Highest education level: Not on file  Occupational History  . Occupation: Retired  Tobacco Use  . Smoking status: Former Smoker    Years: 15.00    Quit date: 05/15/1967    Years since quitting: 53.3  . Smokeless tobacco: Never Used  Substance and Sexual Activity  . Alcohol use: No  . Drug use: No  . Sexual activity: Never  Other Topics Concern  . Not on file  Social History Narrative   Regular exercise: yes   Social Determinants of Health   Financial Resource Strain: Low Risk   . Difficulty of Paying Living Expenses: Not hard at all  Food Insecurity: No Food Insecurity  . Worried About Programme researcher, broadcasting/film/video in the Last Year: Never true  . Ran Out of Food in the Last Year: Never true  Transportation Needs: No Transportation Needs  . Lack of Transportation (Medical): No  . Lack of Transportation (Non-Medical): No  Physical Activity: Sufficiently Active  . Days of Exercise per Week: 7 days  . Minutes of Exercise per Session: 40 min  Stress: No Stress Concern Present  . Feeling of Stress : Not at all  Social Connections: Moderately Integrated  . Frequency of Communication with Friends and Family: More than three times a week  . Frequency of Social Gatherings with Friends and Family: More than three times a week  . Attends Religious Services: More than 4 times per year  . Active Member of Clubs or Organizations: Yes  . Attends Banker Meetings: 1 to 4 times per year  . Marital Status: Widowed    Tobacco Counseling Counseling given: Not Answered   Clinical Intake:  Pre-visit preparation completed: Yes  Pain : No/denies pain     Nutritional Status: BMI > 30  Obese Nutritional Risks: None Diabetes: No  How often do you need to have  someone help you when you read instructions, pamphlets, or other written materials from your doctor or pharmacy?: 1 - Never  Diabetic?No  Interpreter Needed?: No  Information entered by :: Thomasenia Sales LPN   Activities of Daily Living In your present state of health, do you have any difficulty performing the following activities: 09/01/2020  Hearing? N  Vision? N  Difficulty concentrating or making decisions? N  Walking or climbing stairs? N  Dressing or bathing? N  Doing errands, shopping? N  Some recent data might be hidden    Patient Care Team: Sandford Craze, NP as PCP - General  Hart Carwin, MD (Inactive) as Consulting Physician (Gastroenterology) Sheran Luz, MD as Consulting Physician (Physical Medicine and Rehabilitation) Henrene Pastor, PharmD (Pharmacist)  Indicate any recent Medical Services you may have received from other than [facility] providers in the past year (date may be approximate).     Assessment:   This is a routine wellness examination for [place].  Hearing/Vision screen  Hearing Screening   125Hz  250Hz  500Hz  1000Hz  2000Hz  3000Hz  4000Hz  6000Hz  8000Hz   Right ear:           Left ear:           Comments: No issues  Vision Screening Comments: Last eye exam-2020-Dr. Reading glasses  Dietary issues and exercise activities discussed: Current Exercise Habits: Home exercise routine, Type of exercise: treadmill, Time (Minutes): 25, Frequency (Times/Week): 7, Weekly Exercise (Minutes/Week): 175, Intensity: Mild, Exercise limited by: None identified  Goals    . Chronic Care Management Pharmacy Care Plan     CARE PLAN ENTRY (see longitudinal plan of care for additional care plan information)  Current Barriers:  . Chronic Disease Management support, education, and care coordination needs related to Hypertension, Hyperlipidemia, GERD / acid reflux, Osteoporosis   Hypertension BP Readings from Last 3 Encounters:  05/02/20 136/64  04/04/20 (!)  155/64  03/21/20 (!) 158/55   . Pharmacist Clinical Goal(s): o Over the next 90 days, patient will work with PharmD and providers to achieve BP goal <140/90 . Current regimen:  . Amlodipine 10mg  daily . Quinapril 40mg  daily . Interventions: o Discussed BP goal . Patient self care activities - Over the next 90 days, patient will: o Check BP at least once weekly, document, and provide at future appointments o Ensure daily salt intake < 2300 mg/day o Patient has follow up with PCP next week and BP will be check in office. Will follow up with patient sooner if not at goal.   Hyperlipidemia Lab Results  Component Value Date/Time   LDLCALC 170 (H) 02/01/2020 08:34 AM   LDLDIRECT 158.8 06/10/2008 08:23 AM   . Pharmacist Clinical Goal(s): o Over the next 90 days, patient will work with PharmD and providers to achieve LDL goal < 100 or prevent further increase in LDL  . Current regimen:  . Ezetimibe 10mg  daily . Krill Oil 350mg  daily . Niacin 500mg  daily . Interventions: o Discussed diet and exercise o Discussed LDL goal  . Patient self care activities - Over the next 90 days, patient will: o Maintain cholesterol medication regimen.  o Continue to exercise and restrict serving sizes, especially for foods with high to moderate fat content  Health Maintenance  . Pharmacist Clinical Goal(s) o Over the next 90 days, patient will work with PharmD and providers to complete health maintenance screenings/vaccinations . Interventions: o Recommended patient receive Tetanus/pertussis vaccine.   . Patient self care activities - Over the next 90 days, patient will: o Consider getting tetanus / Tdap vaccine - patient to discuss with primary care provider at appt 08/01/2020   Osteoporosis (Goal prevention of fractures / stabalize bone minteral density)  -Last DEXA Scan: 07/23/2018              T-Score total hip: -0.5 (left)             T-Score lumbar spine: + 0.8             T-Score forearm  radius: -3.6          -Patient is a candidate for pharmacologic treatment due to  low T-Score in right forearm -Current treatment   Prolia 60mg  subcutaneously every 6 months - started 11/01/2018  Calcium + D 600/400mg  daily -Medications previously tried: alendronate -Recommend [phone] units of vitamin D daily. Recommend 1200 mg of calcium daily from dietary and supplemental sources. Recommend weight-bearing and muscle strengthening exercises for building and maintaining bone density. -Due to have DEXA rechecked; will see PCP next week. Consider ordering DEXA to assess Prolia effects on improving / stabilizing BMD.  Medication management . Pharmacist Clinical Goal(s): o Over the next 90 days, patient will work with PharmD and providers to maintain optimal medication adherence . [facility] . Interventions o Comprehensive medication review performed. o Continue current medication management strategy . Patient self care activities - Over the next 90 days, patient will: o Focus on medication adherence by filling and taking medications appropriately  o Take medications as prescribed o Report any questions or concerns to PharmD and/or provider(s)  Please see past updates related to this goal by clicking on the "Past Updates" button in the selected goal      . Increase physical activity     Patient is exercising regularly at least 5 days per week for 20 minutes.     . Weight (lb) < 150 lb (68 kg)     Patient has lost about 20 lbs (current home weight reported to be around 167 bs)      Depression Screen PHQ 2/9 Scores 09/01/2020 08/27/2019 06/04/2019 01/17/2018 09/24/2016 09/24/2016 09/20/2015  PHQ - 2 Score 0 0 0 0 0 0 0  PHQ- 9 Score - - 0 2 2 - -    Fall Risk Fall Risk  09/01/2020 08/27/2019 04/08/2019 01/17/2018 09/24/2016  Falls in the past year? 0 0 1 No No  Comment - - Emmi Telephone Survey: data to providers prior to load - -  Number falls in past yr:  0 0 1 - -  Comment - - Emmi Telephone Survey Actual Response = 2 - -  Injury with Fall? 0 0 0 - -  Follow up Falls prevention discussed Education provided;Falls prevention discussed - - -    FALL RISK PREVENTION PERTAINING TO THE HOME:  Any stairs in or around the home? No  Home free of loose throw rugs in walkways, pet beds, electrical cords, etc? Yes  Adequate lighting in your home to reduce risk of falls? Yes   ASSISTIVE DEVICES UTILIZED TO PREVENT FALLS:  Life alert? Yes  Use of a cane, walker or w/c? No  Grab bars in the bathroom? Yes  Shower chair or bench in shower? No  Elevated toilet seat or a handicapped toilet? No   TIMED UP AND GO:  Was the test performed? Yes .  Length of time to ambulate 10 feet: 10 sec.   Gait steady and fast without use of assistive device  Cognitive Function:Normal cognitive status assessed by direct observation by this Nurse Health Advisor. No abnormalities found.   MMSE - Mini Mental State Exam 09/24/2016  Orientation to time 5  Orientation to Place 5  Registration 3  Attention/ Calculation 5  Recall 2  Language- name 2 objects 2  Language- repeat 1  Language- follow 3 step command 3  Language- read & follow direction 1  Write a sentence 1  Copy design 1  Total score 29     6CIT Screen 09/01/2020  What Year? 0 points  What month? 0 points  What time? 0 points  Count  back from 20 0 points  Months in reverse 0 points  Repeat phrase 0 points  Total Score 0    Immunizations Immunization History  Administered Date(s) Administered  . Fluad Quad(high Dose 65+) 02/25/2019, 02/01/2020  . Influenza Split 04/18/2011, 02/08/2012  . Influenza Whole 04/01/2007, 02/02/2008, 01/31/2009, 02/20/2010  . Influenza, High Dose Seasonal PF 04/01/2018  . Influenza,inj,Quad PF,6+ Mos 02/11/2014, 01/28/2015, 03/27/2016  . Influenza-Unspecified 03/14/2013, 03/05/2017  . PFIZER(Purple Top)SARS-COV-2 Vaccination 06/03/2019, 06/24/2019, 03/03/2020   . Pneumococcal Conjugate-13 03/23/2015  . Pneumococcal Polysaccharide-23 07/24/2006, 02/25/2019  . Td 03/19/2002, 05/14/2006  . Tdap 07/12/2020  . Zoster Recombinat (Shingrix) 04/02/2017, 06/02/2017    TDAP status: Due, Education has been provided regarding the importance of this vaccine. Advised may receive this vaccine at local pharmacy or Health Dept. Aware to provide a copy of the vaccination record if obtained from local pharmacy or Health Dept. Verbalized acceptance and understanding.  Flu Vaccine status: Up to date  Pneumococcal vaccine status: Up to date  Covid-19 vaccine status: Completed vaccines  Qualifies for Shingles Vaccine? No   Zostavax completed No   Shingrix Completed?: Yes  Screening Tests Health Maintenance  Topic Date Due  . Hepatitis C Screening  Never done  . HEMOGLOBIN A1C  01/21/2020  . INFLUENZA VACCINE  12/12/2020  . TETANUS/TDAP  07/13/2030  . DEXA SCAN  Completed  . COVID-19 Vaccine  Completed  . PNA vac Low Risk Adult  Completed  . HPV VACCINES  Aged Out  . FOOT EXAM  Discontinued  . OPHTHALMOLOGY EXAM  Discontinued    Health Maintenance  Health Maintenance Due  Topic Date Due  . Hepatitis C Screening  Never done  . HEMOGLOBIN A1C  01/21/2020    Colorectal cancer screening: No longer required.   Mammogram status: Ordered today. Pt provided with contact info and advised to call to schedule appt.   Bone Density status: Completed 08/08/2020. Results reflect: Bone density results: OSTEOPOROSIS. Repeat every 2 years.  Lung Cancer Screening: (Low Dose CT Chest recommended if Age 52-80 years, 30 pack-year currently smoking OR have quit w/in 15years.) does not qualify.    Additional Screening:  Hepatitis C Screening: does not qualify  Vision Screening: Recommended annual ophthalmology exams for early detection of glaucoma and other disorders of the eye. Is the patient up to date with their annual eye exam?  No  Who is the provider or  what is the name of the office in which the patient attends annual eye exams? Dr. Lucretia Roers Patient will make an appt soon  Dental Screening: Recommended annual dental exams for proper oral hygiene  Community Resource Referral / Chronic Care Management: CRR required this visit?  No   CCM required this visit?  No      Plan:     I have personally reviewed and noted the following in the patient's chart:   . Medical and social history . Use of alcohol, tobacco or illicit drugs  . Current medications and supplements . Functional ability and status . Nutritional status . Physical activity . Advanced directives . List of other physicians . Hospitalizations, surgeries, and ER visits in previous 12 months . Vitals . Screenings to include cognitive, depression, and falls . Referrals and appointments  In addition, I have reviewed and discussed with patient certain preventive protocols, quality metrics, and best practice recommendations. A written personalized care plan for preventive services as well as general preventive health recommendations were provided to patient.    Patient would like to  access avs on my-chart.   Roanna Raider, LPN   05/19/2692  Nurse Health Advisor  Nurse Notes: None

## 2020-09-01 ENCOUNTER — Ambulatory Visit (INDEPENDENT_AMBULATORY_CARE_PROVIDER_SITE_OTHER): Payer: PPO

## 2020-09-01 ENCOUNTER — Other Ambulatory Visit: Payer: Self-pay

## 2020-09-01 VITALS — BP 148/78 | HR 87 | Temp 97.7°F | Resp 16 | Ht 62.0 in | Wt 166.4 lb

## 2020-09-01 DIAGNOSIS — Z0000 Encounter for general adult medical examination without abnormal findings: Secondary | ICD-10-CM

## 2020-09-01 DIAGNOSIS — Z1231 Encounter for screening mammogram for malignant neoplasm of breast: Secondary | ICD-10-CM

## 2020-09-01 NOTE — Patient Instructions (Signed)
[name] , Thank you for taking time to come for your Medicare Wellness Visit. I appreciate your ongoing commitment to your health goals. Please review the following plan we discussed and let me know if I can assist you in the future.   Screening recommendations/referrals: Colonoscopy: No longer required Mammogram: Ordered today. Someone will call you to schedule. Bone Density: Completed 08/08/20-Due 08/09/2022 Recommended yearly ophthalmology/optometry visit for glaucoma screening and checkup Recommended yearly dental visit for hygiene and checkup  Vaccinations: Influenza vaccine: Up to date Pneumococcal vaccine: Completed vaccines Tdap vaccine: Up to date-Due 07/2030 Shingles vaccine: Completed vaccines   Covid-19:Completed vaccines  Advanced directives: Please bring a copy for your chart  Conditions/risks identified: See problem list  Next appointment: Follow up in one year for your annual wellness visit 09/07/2021 @ 9:40   Preventive Care 65 Years and Older, Female Preventive care refers to lifestyle choices and visits with your health care provider that can promote health and wellness. What does preventive care include?  A yearly physical exam. This is also called an annual well check.  Dental exams once or twice a year.  Routine eye exams. Ask your health care provider how often you should have your eyes checked.  Personal lifestyle choices, including:  Daily care of your teeth and gums.  Regular physical activity.  Eating a healthy diet.  Avoiding tobacco and drug use.  Limiting alcohol use.  Practicing safe sex.  Taking low-dose aspirin every day.  Taking vitamin and mineral supplements as recommended by your health care provider. What happens during an annual well check? The services and screenings done by your health care provider during your annual well check will depend on your age, overall health, lifestyle risk factors, and family history of  disease. Counseling  Your health care provider may ask you questions about your:  Alcohol use.  Tobacco use.  Drug use.  Emotional well-being.  Home and relationship well-being.  Sexual activity.  Eating habits.  History of falls.  Memory and ability to understand (cognition).  Work and work Astronomer.  Reproductive health. Screening  You may have the following tests or measurements:  Height, weight, and BMI.  Blood pressure.  Lipid and cholesterol levels. These may be checked every 5 years, or more frequently if you are over 24 years old.  Skin check.  Lung cancer screening. You may have this screening every year starting at age 80 if you have a 30-pack-year history of smoking and currently smoke or have quit within the past 15 years.  Fecal occult blood test (FOBT) of the stool. You may have this test every year starting at age 80.  Flexible sigmoidoscopy or colonoscopy. You may have a sigmoidoscopy every 5 years or a colonoscopy every 10 years starting at age 80.  Hepatitis C blood test.  Hepatitis B blood test.  Sexually transmitted disease (STD) testing.  Diabetes screening. This is done by checking your blood sugar (glucose) after you have not eaten for a while (fasting). You may have this done every 1-3 years.  Bone density scan. This is done to screen for osteoporosis. You may have this done starting at age 80.  Mammogram. This may be done every 1-2 years. Talk to your health care provider about how often you should have regular mammograms. Talk with your health care provider about your test results, treatment options, and if necessary, the need for more tests. Vaccines  Your health care provider may recommend certain vaccines, such as:  Influenza vaccine.  This is recommended every year.  Tetanus, diphtheria, and acellular pertussis (Tdap, Td) vaccine. You may need a Td booster every 10 years.  Zoster vaccine. You may need this after age  40.  Pneumococcal 13-valent conjugate (PCV13) vaccine. One dose is recommended after age 80.  Pneumococcal polysaccharide (PPSV23) vaccine. One dose is recommended after age 80. Talk to your health care provider about which screenings and vaccines you need and how often you need them. This information is not intended to replace advice given to you by your health care provider. Make sure you discuss any questions you have with your health care provider. Document Released: 05/27/2015 Document Revised: 01/18/2016 Document Reviewed: 03/01/2015 Elsevier Interactive Patient Education  2017 [place] Prevention in the Home Falls can cause injuries. They can happen to people of all ages. There are many things you can do to make your home safe and to help prevent falls. What can I do on the outside of my home?  Regularly fix the edges of walkways and driveways and fix any cracks.  Remove anything that might make you trip as you walk through a door, such as a raised step or threshold.  Trim any bushes or trees on the path to your home.  Use bright outdoor lighting.  Clear any walking paths of anything that might make someone trip, such as rocks or tools.  Regularly check to see if handrails are loose or broken. Make sure that both sides of any steps have handrails.  Any raised decks and porches should have guardrails on the edges.  Have any leaves, snow, or ice cleared regularly.  Use sand or salt on walking paths during winter.  Clean up any spills in your garage right away. This includes oil or grease spills. What can I do in the bathroom?  Use night lights.  Install grab bars by the toilet and in the tub and shower. Do not use towel bars as grab bars.  Use non-skid mats or decals in the tub or shower.  If you need to sit down in the shower, use a plastic, non-slip stool.  Keep the floor dry. Clean up any water that spills on the floor as soon as it happens.  Remove  soap buildup in the tub or shower regularly.  Attach bath mats securely with double-sided non-slip rug tape.  Do not have throw rugs and other things on the floor that can make you trip. What can I do in the bedroom?  Use night lights.  Make sure that you have a light by your bed that is easy to reach.  Do not use any sheets or blankets that are too big for your bed. They should not hang down onto the floor.  Have a firm chair that has side arms. You can use this for support while you get dressed.  Do not have throw rugs and other things on the floor that can make you trip. What can I do in the kitchen?  Clean up any spills right away.  Avoid walking on wet floors.  Keep items that you use a lot in easy-to-reach places.  If you need to reach something above you, use a strong step stool that has a grab bar.  Keep electrical cords out of the way.  Do not use floor polish or wax that makes floors slippery. If you must use wax, use non-skid floor wax.  Do not have throw rugs and other things on the floor that can make  you trip. What can I do with my stairs?  Do not leave any items on the stairs.  Make sure that there are handrails on both sides of the stairs and use them. Fix handrails that are broken or loose. Make sure that handrails are as long as the stairways.  Check any carpeting to make sure that it is firmly attached to the stairs. Fix any carpet that is loose or worn.  Avoid having throw rugs at the top or bottom of the stairs. If you do have throw rugs, attach them to the floor with carpet tape.  Make sure that you have a light switch at the top of the stairs and the bottom of the stairs. If you do not have them, ask someone to add them for you. What else can I do to help prevent falls?  Wear shoes that:  Do not have high heels.  Have rubber bottoms.  Are comfortable and fit you well.  Are closed at the toe. Do not wear sandals.  If you use a  stepladder:  Make sure that it is fully opened. Do not climb a closed stepladder.  Make sure that both sides of the stepladder are locked into place.  Ask someone to hold it for you, if possible.  Clearly mark and make sure that you can see:  Any grab bars or handrails.  First and last steps.  Where the edge of each step is.  Use tools that help you move around (mobility aids) if they are needed. These include:  Canes.  Walkers.  Scooters.  Crutches.  Turn on the lights when you go into a dark area. Replace any light bulbs as soon as they burn out.  Set up your furniture so you have a clear path. Avoid moving your furniture around.  If any of your floors are uneven, fix them.  If there are any pets around you, be aware of where they are.  Review your medicines with your doctor. Some medicines can make you feel dizzy. This can increase your chance of falling. Ask your doctor what other things that you can do to help prevent falls. This information is not intended to replace advice given to you by your health care provider. Make sure you discuss any questions you have with your health care provider. Document Released: 02/24/2009 Document Revised: 10/06/2015 Document Reviewed: 06/04/2014 Elsevier Interactive Patient Education  2017 [employer].

## 2020-09-05 ENCOUNTER — Other Ambulatory Visit: Payer: Self-pay

## 2020-09-05 ENCOUNTER — Ambulatory Visit (HOSPITAL_BASED_OUTPATIENT_CLINIC_OR_DEPARTMENT_OTHER)
Admission: RE | Admit: 2020-09-05 | Discharge: 2020-09-05 | Disposition: A | Discharge status: Home / Self Care | Payer: PPO | Source: Ambulatory Visit | Attending: Family | Admitting: Family

## 2020-09-05 DIAGNOSIS — Z1231 Encounter for screening mammogram for malignant neoplasm of breast: Secondary | ICD-10-CM

## 2020-09-21 ENCOUNTER — Other Ambulatory Visit (HOSPITAL_BASED_OUTPATIENT_CLINIC_OR_DEPARTMENT_OTHER): Payer: Self-pay

## 2020-09-21 MED FILL — Omeprazole Cap Delayed Release 20 MG: ORAL | 90 days supply | Qty: 180 | Fill #0 | Status: AC

## 2020-09-26 ENCOUNTER — Other Ambulatory Visit (HOSPITAL_BASED_OUTPATIENT_CLINIC_OR_DEPARTMENT_OTHER): Payer: Self-pay

## 2020-09-26 ENCOUNTER — Other Ambulatory Visit: Payer: Self-pay | Admitting: Family

## 2020-09-26 MED ORDER — QUINAPRIL HCL 40 MG PO TABS
40.0000 mg | ORAL_TABLET | Freq: Every day | ORAL | 1 refills | Status: DC
  Filled 2020-09-26: qty 90, 90d supply, fill #0
  Filled 2020-12-23: qty 90, 90d supply, fill #1

## 2020-09-30 ENCOUNTER — Other Ambulatory Visit (HOSPITAL_BASED_OUTPATIENT_CLINIC_OR_DEPARTMENT_OTHER): Payer: Self-pay

## 2020-09-30 MED FILL — Ezetimibe Tab 10 MG: ORAL | 90 days supply | Qty: 90 | Fill #0 | Status: AC

## 2020-10-25 ENCOUNTER — Other Ambulatory Visit: Payer: Self-pay | Admitting: Family

## 2020-10-25 ENCOUNTER — Other Ambulatory Visit (HOSPITAL_BASED_OUTPATIENT_CLINIC_OR_DEPARTMENT_OTHER): Payer: Self-pay

## 2020-10-25 MED ORDER — AMLODIPINE BESYLATE 10 MG PO TABS
ORAL_TABLET | Freq: Every day | ORAL | 1 refills | Status: DC
  Filled 2020-10-25: qty 90, 90d supply, fill #0
  Filled 2021-01-17: qty 90, 90d supply, fill #1

## 2020-10-28 ENCOUNTER — Encounter: Payer: PPO | Admitting: Pharmacist

## 2020-10-28 NOTE — Chronic Care Management (AMB) (Signed)
Erroneous encounter

## 2020-10-30 NOTE — Progress Notes (Signed)
This encounter was created in error - please disregard.  This encounter was created in error - please disregard.

## 2020-11-23 ENCOUNTER — Ambulatory Visit (INDEPENDENT_AMBULATORY_CARE_PROVIDER_SITE_OTHER): Payer: PPO | Admitting: Pharmacist

## 2020-11-23 DIAGNOSIS — I10 Essential (primary) hypertension: Secondary | ICD-10-CM | POA: Diagnosis not present

## 2020-11-23 DIAGNOSIS — E785 Hyperlipidemia, unspecified: Secondary | ICD-10-CM

## 2020-11-23 DIAGNOSIS — M810 Age-related osteoporosis without current pathological fracture: Secondary | ICD-10-CM

## 2020-11-23 NOTE — Chronic Care Management (AMB) (Signed)
Chronic Care Management Pharmacy Note  11/23/2020 Name:  [name] MRN:  [id number] DOB:  1941/02/22  Subjective: [name] is an 80 y.o. year old female who is a primary patient of [name], NP.  The CCM team was consulted for assistance with disease management and care coordination needs.    Engaged with patient by telephone for follow up visit in response to provider referral for pharmacy case management and/or care coordination services.   Consent to Services:  The patient was given information about Chronic Care Management services, agreed to services, and gave verbal consent prior to initiation of services.  Please see initial visit note for detailed documentation.   Patient Care Team: [name], NP as PCP - General Olevia Perches Lowella Bandy, MD (Inactive) as Consulting Physician (Gastroenterology) Suella Broad, MD as Consulting Physician (Physical Medicine and Rehabilitation) Cherre Robins, PharmD (Pharmacist)  Recent office visits: 08/01/2020 - PCP Inda [place]) No medication changes: HTN- initial bp mildly elevated.  Follow up bp improved. Continue amloidipine 10 mg once daily. Obtain follow up bmet.  Osteopososis- will be due for prolia after 3/29. Due for dexa. Order placed.   Vit D deficiency- obtain follow up vit D level.  Recent consult visits: None since last CCM visit  Hospital visits: None in previous 6 months  Objective:  Lab Results  Component Value Date   CREATININE 0.66 08/01/2020   CREATININE 0.72 02/01/2020   CREATININE 0.67 02/03/2019    Lab Results  Component Value Date   HGBA1C 5.6 07/21/2019   Last diabetic Eye exam: No results found for: HMDIABEYEEXA  Last diabetic Foot exam: No results found for: HMDIABFOOTEX      Component Value Date/Time   CHOL 267 (H) 02/01/2020 0834   TRIG 212 (H) 02/01/2020 0834   HDL 59 02/01/2020 0834   CHOLHDL 4.5 02/01/2020 0834   VLDL 26.0 01/20/2019 0948   LDLCALC 170 (H)  02/01/2020 0834   LDLDIRECT 158.8 06/10/2008 0823    Hepatic Function Latest Ref Rng & Units 02/01/2020 01/20/2019 07/23/2018  Total Protein 6.1 - 8.1 g/dL 6.5 6.4 6.5  Albumin 3.5 - 5.2 g/dL - 4.3 4.3  AST 10 - 35 U/L '19 22 18  ' ALT 6 - 29 U/L '22 28 20  ' Alk Phosphatase 39 - 117 U/L - 61 74  Total Bilirubin 0.2 - 1.2 mg/dL 0.4 0.5 0.4  Bilirubin, Direct 0.0 - 0.3 mg/dL - - -    Lab Results  Component Value Date/Time   TSH 2.25 06/10/2008 08:23 AM   TSH 1.47 04/01/2007 12:00 AM    CBC Latest Ref Rng & Units 11/18/2015 02/26/2014 02/17/2014  WBC 4.0 - 10.5 K/uL 9.8 10.0 18.3(H)  Hemoglobin 12.0 - 15.0 g/dL 14.3 13.0 12.1  Hematocrit 36.0 - 46.0 % 42.9 40.0 35.5(L)  Platelets 150.0 - 400.0 K/uL 370.0 623.0(H) 344    Lab Results  Component Value Date/Time   VD25OH 49.28 08/01/2020 08:34 AM   VD25OH 48 08/27/2012 08:48 AM    Clinical ASCVD: No  The 10-year ASCVD risk score Mikey Bussing DC Jr., et al., 2013) is: 61.7%   Values used to calculate the score:     Age: 76 years     Sex: Female     Is Non-Hispanic African American: No     Diabetic: Yes     Tobacco smoker: No     Systolic Blood Pressure: 557 mmHg     Is BP treated: Yes     HDL Cholesterol: 59  mg/dL     Total Cholesterol: 267 mg/dL     Social History   Tobacco Use  Smoking Status Former   Years: 15.00   Pack years: 0.00   Types: Cigarettes   Quit date: 05/15/1967   Years since quitting: 53.5  Smokeless Tobacco Never   BP Readings from Last 3 Encounters:  09/01/20 (!) 148/78  08/01/20 130/72  05/02/20 136/64   Pulse Readings from Last 3 Encounters:  09/01/20 87  08/01/20 83  05/02/20 75   Wt Readings from Last 3 Encounters:  09/01/20 166 lb 6.4 oz (75.5 kg)  08/01/20 172 lb 3.2 oz (78.1 kg)  05/02/20 171 lb (77.6 kg)    Assessment: Review of patient past medical history, allergies, medications, health status, including review of consultants reports, laboratory and other test data, was performed as part of  comprehensive evaluation and provision of chronic care management services.   SDOH:  (Social Determinants of Health) assessments and interventions performed:  SDOH Interventions    Flowsheet Row Most Recent Value  SDOH Interventions   Physical Activity Interventions Intervention Not Indicated       CCM Care Plan  Allergies  Allergen Reactions   Atorvastatin Other (See Comments)    REACTION: MUSCLE PAINS   Diclofenac-Misoprostol Diarrhea and Nausea And Vomiting   Fenofibrate Other (See Comments)    REACTION: carpopedal spasms   Rosuvastatin Other (See Comments)    REACTION: MUSCLE PAINS   Statins Other (See Comments)    myalgias   Amoxicillin-Pot Clavulanate Other (See Comments)    REACTION: unspecified   Penicillins Hives   Sulfonamide Derivatives Hives   Tuberculin Ppd Other (See Comments)    Redness at sight    Medications Reviewed Today     Reviewed by Cherre Robins, PharmD (Pharmacist) on 11/23/20 at 1434  Med List Status: <None>   Medication Order Taking? Sig Documenting Provider Last Dose Status Informant  amLODipine (NORVASC) 10 MG tablet [id number] Yes TAKE 1 TABLET (10 MG TOTAL) BY MOUTH DAILY. [name], NP Taking Active   Ascorbic Acid (VITAMIN C) 1000 MG tablet [id number] Yes Take 1,000 mg by mouth daily. [provider] Taking Active Self  aspirin (ASPIRIN 81) 81 MG EC tablet [id number] Yes Take 1 tablet (81 mg total) by mouth daily. Swallow whole. [name], NP Taking Active   Calcium Carbonate-Vitamin D 600-400 MG-UNIT tablet [id number] Yes Take 1 tablet by mouth 2 (two) times daily. [provider] Taking Active Self  carboxymethylcellulose (REFRESH PLUS) 0.5 % SOLN [id number] Yes Place 1 drop into both eyes 2 (two) times daily. [provider] Taking Active Self  Cholecalciferol (VITAMIN D) 125 MCG (5000 UT) CAPS [id number] Yes Take 1 capsule by mouth daily. [provider] Taking Active   Coenzyme Q10 200 MG  capsule [id number] Yes Take 200 mg by mouth daily. [provider] Taking Active Self  ezetimibe (ZETIA) 10 MG tablet [id number] Yes TAKE 1 TABLET BY MOUTH ONCE DAILY [name], NP Taking Active   ibuprofen (ADVIL,MOTRIN) 200 MG tablet [id number] Yes Take 400 mg by mouth daily. [provider] Taking Active   Multiple Vitamin (MULTIVITAMIN) tablet [id number] Yes Take 1 tablet by mouth daily. [provider] Taking Active Self  niacin 500 MG tablet [id number] Yes Take 500 mg by mouth at bedtime. [provider] Taking Active Self           Med Note De Blanch   Fri Apr 29, 2020  9:20 AM)    Omega-3  Fatty Acids (FISH OIL) 1000 MG CAPS [id number] Yes Take 1 capsule by mouth every morning. [provider] Taking Active Self  omeprazole (PRILOSEC) 20 MG capsule [id number] Yes TAKE 1 CAPSULE BY MOUTH ONCE DAILY, AND 1 CAPSULE AS NEEDED. MAX OF 2 CAPSULES PER DAY [name], NP Taking Active   quinapril (ACCUPRIL) 40 MG tablet [id number] Yes Take 1 tablet (40 mg total) by mouth at bedtime. [name], NP Taking Active   vitamin E 180 MG (400 UNITS) capsule [id number] Yes Take 400 Units by mouth daily. [provider] Taking Active Self            Patient Active Problem List   Diagnosis Date Noted   Preventative health care 12/14/2014   Hyponatremia 02/20/2014   Hypokalemia 02/20/2014   Hyperglycemia 11/17/2013   Need for prophylactic vaccination and inoculation against influenza 02/08/2012   HYPERCHOLESTEROLEMIA 04/01/2008   BARRETTS ESOPHAGUS 04/01/2008   OSTEOARTHRITIS 04/01/2008   URINARY INCONTINENCE 04/01/2008   TOBACCO ABUSE, HX OF 04/01/2008   GERD 09/24/2007   Essential hypertension 04/01/2007   Osteoporosis 05/31/2006   COLONIC POLYPS, HX OF 05/31/2006   NEPHROLITHIASIS, HX OF 05/31/2006    Immunization History  Administered Date(s) Administered   Fluad Quad(high Dose 65+) 02/25/2019, 02/01/2020    Influenza Split 04/18/2011, 02/08/2012   Influenza Whole 04/01/2007, 02/02/2008, 01/31/2009, 02/20/2010   Influenza, High Dose Seasonal PF 04/01/2018   Influenza,inj,Quad PF,6+ Mos 02/11/2014, 01/28/2015, 03/27/2016   Influenza-Unspecified 03/14/2013, 03/05/2017   PFIZER(Purple Top)SARS-COV-2 Vaccination 06/03/2019, 06/24/2019, 03/03/2020, 09/29/2020   Pneumococcal Conjugate-13 03/23/2015   Pneumococcal Polysaccharide-23 07/24/2006, 02/25/2019   Td 03/19/2002, 05/14/2006   Tdap 07/12/2020   Zoster Recombinat (Shingrix) 04/02/2017, 06/02/2017    Conditions to be addressed/monitored: HTN, HLD, and osteoporosis, GERD  Care Plan : General Pharmacy (Adult)  Updates made by Cherre Robins, PHARMD since 11/23/2020 12:00 AM     Problem: HTN; HDL; osteoporosis; GERD   Note:   Current Barriers:  Unable to achieve control of hyperlipidemia -due to statin intolerance   Pharmacist Clinical Goal(s):  Over the next 180 days, patient will achieve control of hyperlipidmeia as evidenced by LDL <100 maintain control of hypertension as evidenced by BP <140/90  through collaboration with PharmD and provider.   Interventions: 1:1 collaboration with [name], NP regarding development and update of comprehensive plan of care as evidenced by provider attestation and co-signature Inter-disciplinary care team collaboration (see longitudinal plan of care) Comprehensive medication review performed; medication list updated in electronic medical record  Hypertension Improving control but still varilable; BP goal <140/90 Current regimen:  Amlodipine 61m daily Quinapril 463mdaily Interventions: Discussed BP goal Reviewed refill history for blood pressure medications Reocmmended patient check blood pressure every other day, document, and provide at future appointments Ensure daily salt intake < 2300 mg/day   Hyperlipidemia Not at goal;  LDL goal < 100 or prevent further increase in  LDL Current regimen:  Ezetimibe 1082maily Krill Oil 350m92mily Niacin 500mg64mly Past medication tried: fenofibrate - caused carpopedal spasms; atorvastatin, simvastatin and rosuvastatin all caused myalgias Interventions: Discussed diet and exercise Discussed LDL goal  Maintain cholesterol medication regimen.  Continue to exercise and restrict serving sizes, especially for foods with high to moderate fat content Recheck lipids in 3 months  Health Maintenance  Pharmacist Clinical Goal(s) Over the next 90 days, patient will work with PharmD and providers to complete health maintenance screenings/vaccinations Interventions: Recommended patient receive Tetanus/pertussis vaccine (done) Also verified she has gotten 2nd Covid booster  Patient self care activities - Over the next 90 days, patient will: None in regards to vaccinations  Osteoporosis (Goal prevention of fractures / stabalize bone minteral density) Goal: prevent fractures and falls  Current treatment  Prolia 33m subcutaneously every 6 months - started 11/01/2018 Calcium + D 600/4057mdaily Last DEXA Scan: 07/23/2018  T-Score total hip: -0.5 (left) T-Score lumbar spine: + 0.8 T-Score forearm radius: -3.6 Medications previously tried: alendronate Next Prolia injection due around 08/08/2020.  Interventions: Recommend [id number] units of vitamin D daily.  Recommend 1200 mg of calcium daily from dietary and supplemental sources. Recommend weight-bearing and muscle strengthening exercises for building and maintaining bone density. Due to have DEXA rechecked (completed 07/2020)  Continue weight bearing exercise Fall prevention discussed  Medication management [facility] Interventions Comprehensive medication review performed. Continue current medication management strategy) Reviewed recent medication refill history - patient is filling maintenance meds on time   Patient  Goals/Self-Care Activities Over the next 180 days, patient will:  take medications as prescribed, check blood pressure every other day, document, and provide at future appointments, and target a minimum of 150 minutes of moderate intensity exercise weekly  Follow Up Plan: Telephone follow up appointment with care management team member scheduled for:  6 months       Long-Range Goal: pharmacy related goals for chronic care and medication management   Priority: High      Medication Assistance: None required.  Patient affirms current coverage meets needs.  [facility] [id number]: [phone]: [phone] [address]: [phone]: [phone] Follow Up:  Patient agrees to Care Plan and Follow-up.  Plan: Telephone follow up appointment with care management team member scheduled for:  6 months  TaCherre RobinsPharmD Clinical Pharmacist [phone]

## 2020-11-23 NOTE — Patient Instructions (Signed)
Visit Information  PATIENT GOALS:  Goals Addressed             This Visit's Progress    Chronic Care Management Pharmacy Care Plan   Not on track    CARE PLAN ENTRY (see longitudinal plan of care for additional care plan information)  Current Barriers:  Chronic Disease Management support, education, and care coordination needs related to Hypertension, Hyperlipidemia, GERD / acid reflux, Osteoporosis   Hypertension BP Readings from Last 3 Encounters:  09/01/20 (!) 148/78  08/01/20 130/72  05/02/20 136/64  Pharmacist Clinical Goal(s): Over the next 90 days, patient will work with PharmD and providers to achieve BP goal <140/90 Current regimen:  Amlodipine 10mg  daily Quinapril 40mg  daily Interventions: Discussed BP goal Reviewed refill history for blood pressure medications Patient self care activities - Over the next 90 days, patient will: Check blood pressure every other day, document, and provide at future appointments Ensure daily salt intake < 2300 mg/day   Hyperlipidemia Lab Results  Component Value Date/Time   LDLCALC 170 (H) 02/01/2020 08:34 AM   LDLDIRECT 158.8 06/10/2008 08:23 AM  Pharmacist Clinical Goal(s): Over the next 90 days, patient will work with PharmD and providers to achieve LDL goal < 100 or prevent further increase in LDL Current regimen:  Ezetimibe 10mg  daily Krill Oil 350mg  daily Niacin 500mg  daily Interventions: Discussed diet and exercise Discussed LDL goal  Patient self care activities - Over the next 90 days, patient will: Maintain cholesterol medication regimen.  Continue to exercise and restrict serving sizes, especially for foods with high to moderate fat content Recheck lipids in 3 months  Health Maintenance  Pharmacist Clinical Goal(s) Over the next 90 days, patient will work with PharmD and providers to complete health maintenance screenings/vaccinations Interventions: Recommended patient receive Tetanus/pertussis vaccine  (done) Also verified she has gotten 2nd Covid booster Patient self care activities - Over the next 90 days, patient will: None in regards to vaccinations  Osteoporosis (Goal prevention of fractures / stabalize bone minteral density) Pharmacist Clinical Goal(s) Over the next 90 days, patient will work with PharmD and providers to prevent fractures and falls  Current treatment  Prolia 60mg  subcutaneously every 6 months - started 11/01/2018 Calcium + D 600/400mg  daily Interventions: Recommend [phone] units of vitamin D daily.  Recommend 1200 mg of calcium daily from dietary and supplemental sources. Recommend weight-bearing and muscle strengthening exercises for building and maintaining bone density. Due to have DEXA rechecked (completed 07/2020) Patient self care activities - Over the next 90 days, patient will: Continue current therapy for osteoporosis Follow fall prevention tips  Medication management Pharmacist Clinical Goal(s): Over the next 90 days, patient will work with PharmD and providers to maintain optimal medication adherence Current pharmacy: Pharmacy Interventions Comprehensive medication review performed. Continue current medication management strategy Patient self care activities - Over the next 90 days, patient will: Focus on medication adherence by filling and taking medications appropriately  Take medications as prescribed Report any questions or concerns to PharmD and/or provider(s)  Please see past updates related to this goal by clicking on the "Past Updates" button in the selected goal           The patient verbalized understanding of instructions, educational materials, and care plan provided today and declined offer to receive copy of patient instructions, educational materials, and care plan.   Telephone follow up appointment with care management team member scheduled for: 6 months  , PharmD Clinical  Pharmacist [facility]

## 2020-12-23 ENCOUNTER — Other Ambulatory Visit (HOSPITAL_BASED_OUTPATIENT_CLINIC_OR_DEPARTMENT_OTHER): Payer: Self-pay

## 2020-12-28 ENCOUNTER — Other Ambulatory Visit (HOSPITAL_BASED_OUTPATIENT_CLINIC_OR_DEPARTMENT_OTHER): Payer: Self-pay

## 2020-12-28 ENCOUNTER — Other Ambulatory Visit: Payer: Self-pay

## 2020-12-28 ENCOUNTER — Other Ambulatory Visit: Payer: Self-pay | Admitting: Family

## 2020-12-28 MED ORDER — EZETIMIBE 10 MG PO TABS
ORAL_TABLET | Freq: Every day | ORAL | 1 refills | Status: DC
  Filled 2020-12-28: qty 90, 90d supply, fill #0
  Filled 2021-04-03: qty 90, 90d supply, fill #1

## 2021-01-17 ENCOUNTER — Other Ambulatory Visit (HOSPITAL_BASED_OUTPATIENT_CLINIC_OR_DEPARTMENT_OTHER): Payer: Self-pay

## 2021-02-01 ENCOUNTER — Ambulatory Visit: Payer: PPO | Admitting: Family

## 2021-02-06 ENCOUNTER — Ambulatory Visit (INDEPENDENT_AMBULATORY_CARE_PROVIDER_SITE_OTHER): Payer: PPO | Admitting: Family

## 2021-02-06 ENCOUNTER — Other Ambulatory Visit: Payer: Self-pay

## 2021-02-06 ENCOUNTER — Other Ambulatory Visit (HOSPITAL_BASED_OUTPATIENT_CLINIC_OR_DEPARTMENT_OTHER): Payer: Self-pay

## 2021-02-06 VITALS — BP 156/75 | HR 98 | Temp 98.5°F | Resp 16 | Wt 174.0 lb

## 2021-02-06 DIAGNOSIS — R739 Hyperglycemia, unspecified: Secondary | ICD-10-CM

## 2021-02-06 DIAGNOSIS — K2270 Barrett's esophagus without dysplasia: Secondary | ICD-10-CM

## 2021-02-06 DIAGNOSIS — E7800 Pure hypercholesterolemia, unspecified: Secondary | ICD-10-CM

## 2021-02-06 DIAGNOSIS — R011 Cardiac murmur, unspecified: Secondary | ICD-10-CM

## 2021-02-06 DIAGNOSIS — Z23 Encounter for immunization: Secondary | ICD-10-CM | POA: Diagnosis not present

## 2021-02-06 DIAGNOSIS — I10 Essential (primary) hypertension: Secondary | ICD-10-CM

## 2021-02-06 DIAGNOSIS — E669 Obesity, unspecified: Secondary | ICD-10-CM | POA: Diagnosis not present

## 2021-02-06 LAB — COMPREHENSIVE METABOLIC PANEL
ALT: 20 U/L (ref 0–35)
AST: 18 U/L (ref 0–37)
Albumin: 4.2 g/dL (ref 3.5–5.2)
Alkaline Phosphatase: 65 U/L (ref 39–117)
BUN: 10 mg/dL (ref 6–23)
CO2: 27 mEq/L (ref 19–32)
Calcium: 9.5 mg/dL (ref 8.4–10.5)
Chloride: 102 mEq/L (ref 96–112)
Creatinine, Ser: 0.68 mg/dL (ref 0.40–1.20)
GFR: 82.6 mL/min (ref >60.00)
Glucose, Bld: 100 mg/dL — ABNORMAL HIGH (ref 70–99)
Potassium: 4.5 mEq/L (ref 3.5–5.1)
Sodium: 139 mEq/L (ref 135–145)
Total Bilirubin: 0.5 mg/dL (ref 0.2–1.2)
Total Protein: 6.5 g/dL (ref 6.0–8.3)

## 2021-02-06 LAB — LIPID PANEL
Cholesterol: 285 mg/dL — ABNORMAL HIGH (ref 0–200)
HDL: 62.1 mg/dL (ref >39.00)
NonHDL: 222.87
Total CHOL/HDL Ratio: 5
Triglycerides: 203 mg/dL — ABNORMAL HIGH (ref 0.0–149.0)
VLDL: 40.6 mg/dL — ABNORMAL HIGH (ref 0.0–40.0)

## 2021-02-06 LAB — LDL CHOLESTEROL, DIRECT: Direct LDL: 189 mg/dL

## 2021-02-06 LAB — HEMOGLOBIN A1C: Hgb A1c MFr Bld: 5.8 % (ref 4.6–6.5)

## 2021-02-06 MED ORDER — OMEPRAZOLE 40 MG PO CPDR
40.0000 mg | DELAYED_RELEASE_CAPSULE | Freq: Every day | ORAL | 1 refills | Status: DC
  Filled 2021-02-06: qty 90, 90d supply, fill #0
  Filled 2021-05-23: qty 90, 90d supply, fill #1

## 2021-02-06 MED ORDER — HYDROCHLOROTHIAZIDE 25 MG PO TABS
25.0000 mg | ORAL_TABLET | Freq: Every day | ORAL | 0 refills | Status: DC
  Filled 2021-02-06: qty 90, 90d supply, fill #0

## 2021-02-06 MED ORDER — POTASSIUM CHLORIDE CRYS ER 10 MEQ PO TBCR
10.0000 meq | EXTENDED_RELEASE_TABLET | Freq: Every day | ORAL | 1 refills | Status: DC
  Filled 2021-02-06: qty 30, 30d supply, fill #0

## 2021-02-06 NOTE — Assessment & Plan Note (Addendum)
BP Readings from Last 3 Encounters:  02/06/21 (!) 156/75  09/01/20 (!) 148/78  08/01/20 130/72   Maintained on accupril 40mg  and amlodipine 10mg .  Above goal. Will add hctz 25mg  once daily as well as kdur 10 mEQ once daily.

## 2021-02-06 NOTE — Assessment & Plan Note (Signed)
>>  ASSESSMENT AND PLAN FOR ESSENTIAL HYPERTENSION WRITTEN ON 02/06/2021  9:08 AM BY O'SULLIVAN, Revan Gendron, NP  BP Readings from Last 3 Encounters:  02/06/21 (!) 156/75  09/01/20 (!) 148/78  08/01/20 130/72   Maintained on accupril 40mg  and amlodipine 10mg .  Above goal. Will add hctz 25mg  once daily as well as kdur 10 mEQ once daily.

## 2021-02-06 NOTE — Assessment & Plan Note (Signed)
>>  ASSESSMENT AND PLAN FOR HYPERCHOLESTEROLEMIA WRITTEN ON 02/06/2021  9:07 AM BY DARYL SETTER, NP  Lab Results  Component Value Date   CHOL 267 (H) 02/01/2020   HDL 59 02/01/2020   LDLCALC 170 (H) 02/01/2020   LDLDIRECT 158.8 06/10/2008   TRIG 212 (H) 02/01/2020   CHOLHDL 4.5 02/01/2020   Continue low cholesterol diet.

## 2021-02-06 NOTE — Assessment & Plan Note (Signed)
>>  ASSESSMENT AND PLAN FOR HYPERGLYCEMIA WRITTEN ON 02/06/2021  9:08 AM BY Sandford Craze, NP  Lab Results  Component Value Date   HGBA1C 5.6 07/21/2019   HGBA1C 5.5 01/17/2018   HGBA1C 5.5 07/12/2017   Lab Results  Component Value Date   LDLCALC 170 (H) 02/01/2020   CREATININE 0.66 08/01/2020   Check A1C.

## 2021-02-06 NOTE — Assessment & Plan Note (Addendum)
Lab Results  Component Value Date   HGBA1C 5.6 07/21/2019   HGBA1C 5.5 01/17/2018   HGBA1C 5.5 07/12/2017   Lab Results  Component Value Date   LDLCALC 170 (H) 02/01/2020   CREATININE 0.66 08/01/2020   Check A1C.

## 2021-02-06 NOTE — Assessment & Plan Note (Signed)
Will change form 20mg  caps to 40mg  once daily.

## 2021-02-06 NOTE — Patient Instructions (Signed)
Add HCTZ 25mg  for blood pressure and swelling. Add Kdur 10 mEQ (potassium) once daily.

## 2021-02-06 NOTE — Progress Notes (Signed)
Subjective:   By signing my name below, I, Shehryar Baig, attest that this documentation has been prepared under the direction and in the presence of [name] NP. 02/06/2021     Patient ID: [name], female    DOB: 1940/11/30, 80 y.o.   MRN: [id number]  Chief Complaint  Patient presents with   Hypertension    Here for follow up     HPI Patient is in today for a office visit.  Handicap- She is requesting to fill out a form renewing her handicap parking tags. She is requesting this due to her arthritis limiting her mobility and requiring her to park closer to reduce the distance walked.  Weight- She is requesting  to join a healthy weight an wellness program to help her manage her weight loss.  Reflux- She is requesting a refill on 40 mg Prilosec daily PO. She reports no new issues while taking it.  Blood pressure- Her blood pressure is slightly elevated during this visit. She continues taking 10 mg amlodipine daily PO, 40 mg quinapril daily PO and reports no new issues while taking them. She reports getting occasional leg swelling that worsens while traveling.   BP Readings from Last 3 Encounters:  02/06/21 (!) 156/75  09/01/20 (!) 148/78  08/01/20 130/72   Pulse Readings from Last 3 Encounters:  02/06/21 98  09/01/20 87  08/01/20 83   Cholesterol- She continues taking 10 mg Zetia daily PO, 500 mg niacin and reports no new issues while taking it.  Lab Results  Component Value Date   CHOL 267 (H) 02/01/2020   HDL 59 02/01/2020   LDLCALC 170 (H) 02/01/2020   LDLDIRECT 158.8 06/10/2008   TRIG 212 (H) 02/01/2020   CHOLHDL 4.5 02/01/2020   Immunizations- She has received the flu vaccine during this visit.    Health Maintenance Due  Topic Date Due   Hepatitis C Screening  Never done   HEMOGLOBIN A1C  01/21/2020   INFLUENZA VACCINE  12/12/2020    Past Medical History:  Diagnosis Date   Arthritis    osteoarthritis   Barrett's esophagus    Cataract     bilateral cateracts removed   Chronic kidney disease    kidney stones   Chronic obstructive bronchitis (HCC)    Dr Joya Gaskins   Colitis    Colon polyps    GERD (gastroesophageal reflux disease)    History of chicken pox    History of nephrolithiasis    History of transfusion of packed red blood cells    Hyperlipidemia    Hypertension    IFG (impaired fasting glucose)    Osteoporosis    pt cannot tolerate fosamax   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   Urinary incontinence     Past Surgical History:  Procedure Laterality Date   ABDOMINAL HYSTERECTOMY  1970   APPENDECTOMY     CHOLECYSTECTOMY     LEG SURGERY  2008   right   TOTAL KNEE ARTHROPLASTY  04/2006   total left knee replacement    Family History  Problem Relation Age of Onset   Coronary artery disease Mother    Arthritis Mother    Tuberculosis Other    Asthma Maternal Grandmother    Colon cancer Neg Hx    Esophageal cancer Neg Hx    Rectal cancer Neg Hx    Stomach cancer Neg Hx     Social History  Socioeconomic History   Marital status: Widowed    Spouse name: Not on file   Number of children: Not on file   Years of education: Not on file   Highest education level: Not on file  Occupational History   Occupation: Retired  Tobacco Use   Smoking status: Former    Years: 15.00    Types: Cigarettes    Quit date: 05/15/1967    Years since quitting: 53.7   Smokeless tobacco: Never  Substance and Sexual Activity   Alcohol use: No   Drug use: No   Sexual activity: Never  Other Topics Concern   Not on file  Social History Narrative   Regular exercise: yes   Social Determinants of Health   Financial Resource Strain: Low Risk    Difficulty of Paying Living Expenses: Not hard at all  Food Insecurity: No Food Insecurity   Worried About Charity fundraiser in the Last Year: Never true   [place] in the Last Year: Never true  Transportation Needs: No  Transportation Needs   Lack of Transportation (Medical): No   Lack of Transportation (Non-Medical): No  Physical Activity: Insufficiently Active   Days of Exercise per Week: 7 days   Minutes of Exercise per Session: 20 min  Stress: No Stress Concern Present   Feeling of Stress : Not at all  Social Connections: Moderately Integrated   Frequency of Communication with Friends and Family: More than three times a week   Frequency of Social Gatherings with Friends and Family: More than three times a week   Attends Religious Services: More than 4 times per year   Active Member of [employer] or Organizations: Yes   Attends Archivist Meetings: 1 to 4 times per year   Marital Status: Widowed  Human resources officer Violence: Not At Risk   Fear of Current or Ex-Partner: No   Emotionally Abused: No   Physically Abused: No   Sexually Abused: No    Outpatient Medications Prior to Visit  Medication Sig Dispense Refill   amLODipine (NORVASC) 10 MG tablet TAKE 1 TABLET (10 MG TOTAL) BY MOUTH DAILY. 90 tablet 1   Ascorbic Acid (VITAMIN C) 1000 MG tablet Take 1,000 mg by mouth daily.     aspirin (ASPIRIN 81) 81 MG EC tablet Take 1 tablet (81 mg total) by mouth daily. Swallow whole. 30 tablet 12   Calcium Carbonate-Vitamin D 600-400 MG-UNIT tablet Take 1 tablet by mouth 2 (two) times daily.     carboxymethylcellulose (REFRESH PLUS) 0.5 % SOLN Place 1 drop into both eyes 2 (two) times daily.     Cholecalciferol (VITAMIN D) 125 MCG (5000 UT) CAPS Take 1 capsule by mouth daily.     Coenzyme Q10 200 MG capsule Take 200 mg by mouth daily.     ezetimibe (ZETIA) 10 MG tablet TAKE 1 TABLET BY MOUTH ONCE DAILY 90 tablet 1   ibuprofen (ADVIL,MOTRIN) 200 MG tablet Take 400 mg by mouth daily.     Multiple Vitamin (MULTIVITAMIN) tablet Take 1 tablet by mouth daily.     niacin 500 MG tablet Take 500 mg by mouth at bedtime.     Omega-3 Fatty Acids (FISH OIL) 1000 MG CAPS Take 1 capsule by mouth every morning.      quinapril (ACCUPRIL) 40 MG tablet Take 1 tablet (40 mg total) by mouth at bedtime. 90 tablet 1   vitamin E 180 MG (400 UNITS) capsule Take 400 Units by mouth daily.  omeprazole (PRILOSEC) 20 MG capsule TAKE 1 CAPSULE BY MOUTH ONCE DAILY, AND 1 CAPSULE AS NEEDED. MAX OF 2 CAPSULES PER DAY 180 capsule 1   No facility-administered medications prior to visit.    Allergies  Allergen Reactions   Atorvastatin Other (See Comments)    REACTION: MUSCLE PAINS   Diclofenac-Misoprostol Diarrhea and Nausea And Vomiting   Fenofibrate Other (See Comments)    REACTION: carpopedal spasms   Rosuvastatin Other (See Comments)    REACTION: MUSCLE PAINS   Statins Other (See Comments)    myalgias   Amoxicillin-Pot Clavulanate Other (See Comments)    REACTION: unspecified   Penicillins Hives   Sulfonamide Derivatives Hives   Tuberculin Ppd Other (See Comments)    Redness at sight    ROS See HPI    Objective:    Physical Exam Constitutional:      General: She is not in acute distress.    Appearance: Normal appearance. She is not ill-appearing.  HENT:     Head: Normocephalic and atraumatic.     Right Ear: External ear normal.     Left Ear: External ear normal.  Eyes:     Extraocular Movements: Extraocular movements intact.     Pupils: Pupils are equal, round, and reactive to light.  Cardiovascular:     Rate and Rhythm: Normal rate and regular rhythm.     Heart sounds: Murmur heard.  Systolic murmur is present with a grade of 1/6.    No gallop.     Comments: Her blood pressure measured 158/80 during the PE Pulmonary:     Effort: Pulmonary effort is normal. No respiratory distress.     Breath sounds: Normal breath sounds. No wheezing or rales.  Skin:    General: Skin is warm and dry.  Neurological:     Mental Status: She is alert and oriented to person, place, and time.  Psychiatric:        Behavior: Behavior normal.    BP (!) 156/75 (BP Location: Right Arm, Patient Position:  Sitting, Cuff Size: Small)   Pulse 98   Temp 98.5 F (36.9 C) (Oral)   Resp 16   Wt 174 lb (78.9 kg)   LMP 05/14/1968   SpO2 99%   BMI 31.83 kg/m  Wt Readings from Last 3 Encounters:  02/06/21 174 lb (78.9 kg)  09/01/20 166 lb 6.4 oz (75.5 kg)  08/01/20 172 lb 3.2 oz (78.1 kg)       Assessment & Plan:   Problem List Items Addressed This Visit       Unprioritized   Hyperglycemia    Lab Results  Component Value Date   HGBA1C 5.6 07/21/2019   HGBA1C 5.5 01/17/2018   HGBA1C 5.5 07/12/2017   Lab Results  Component Value Date   LDLCALC 170 (H) 02/01/2020   CREATININE 0.66 08/01/2020  Check A1C.       Relevant Orders   Hemoglobin A1c   HYPERCHOLESTEROLEMIA    Lab Results  Component Value Date   CHOL 267 (H) 02/01/2020   HDL 59 02/01/2020   LDLCALC 170 (H) 02/01/2020   LDLDIRECT 158.8 06/10/2008   TRIG 212 (H) 02/01/2020   CHOLHDL 4.5 02/01/2020  Continue low cholesterol diet.       Relevant Medications   hydrochlorothiazide (HYDRODIURIL) 25 MG tablet   Other Relevant Orders   Lipid panel   Essential hypertension    BP Readings from Last 3 Encounters:  02/06/21 (!) 156/75  09/01/20 (!) 148/78  08/01/20  130/72  Maintained on accupril 59m and amlodipine 149m  Above goal. Will add hctz 2540mnce daily as well as kdur 10 mEQ once daily.       Relevant Medications   hydrochlorothiazide (HYDRODIURIL) 25 MG tablet   Other Relevant Orders   Comp Met (CMET)   BARRETTS ESOPHAGUS    Will change form 20m6mps to 40mg59me daily.       Other Visit Diagnoses     Needs flu shot    -  Primary   Relevant Orders   Flu Vaccine QUAD High Dose(Fluad)   Obesity (BMI 30.0-34.9)       Relevant Orders   Amb Ref to Medical Weight Management   Murmur       Relevant Orders   ECHOCARDIOGRAM COMPLETE        Meds ordered this encounter  Medications   omeprazole (PRILOSEC) 40 MG capsule    Sig: Take 1 capsule (40 mg total) by mouth daily.    Dispense:  90  capsule    Refill:  1    Order Specific Question:   Supervising Provider    Answer:   BLYTHPenni [email or web address]]   hydrochlorothiazide (HYDRODIURIL) 25 MG tablet    Sig: Take 1 tablet (25 mg total) by mouth daily.    Dispense:  90 tablet    Refill:  0    Order Specific Question:   Supervising Provider    Answer:   BLYTHPenni [email or web address]]   potassium chloride (KLOR-CON) 10 MEQ tablet    Sig: Take 1 tablet (10 mEq total) by mouth daily.    Dispense:  30 tablet    Refill:  1    Order Specific Question:   Supervising Provider    Answer:   BLYTHPenni Homans243]    I, MelisDebbrah Alarpersonally preformed the services described in this documentation.  All medical record entries made by the scribe were at my direction and in my presence.  I have reviewed the chart and discharge instructions (if applicable) and agree that the record reflects my personal performance and is accurate and complete. 02/06/2021   I,Shehryar Baig,acting as a scribe for MelisNance Pear,have documented all relevant documentation on the behalf of MelisNance Pearas directed by  MelisNance Pearwhile in the presence of MelisNance Pear   MelisNance Pear

## 2021-02-06 NOTE — Assessment & Plan Note (Addendum)
Lab Results  Component Value Date   CHOL 267 (H) 02/01/2020   HDL 59 02/01/2020   LDLCALC 170 (H) 02/01/2020   LDLDIRECT 158.8 06/10/2008   TRIG 212 (H) 02/01/2020   CHOLHDL 4.5 02/01/2020   Continue low cholesterol diet.

## 2021-02-13 ENCOUNTER — Other Ambulatory Visit: Payer: Self-pay

## 2021-02-13 ENCOUNTER — Ambulatory Visit: Payer: PPO | Attending: Internal Medicine

## 2021-02-13 ENCOUNTER — Other Ambulatory Visit (HOSPITAL_BASED_OUTPATIENT_CLINIC_OR_DEPARTMENT_OTHER): Payer: Self-pay

## 2021-02-13 ENCOUNTER — Ambulatory Visit (INDEPENDENT_AMBULATORY_CARE_PROVIDER_SITE_OTHER): Payer: PPO | Admitting: Family

## 2021-02-13 VITALS — BP 154/72 | HR 89 | Temp 98.6°F | Resp 16 | Wt 173.0 lb

## 2021-02-13 DIAGNOSIS — Z23 Encounter for immunization: Secondary | ICD-10-CM

## 2021-02-13 DIAGNOSIS — I10 Essential (primary) hypertension: Secondary | ICD-10-CM | POA: Diagnosis not present

## 2021-02-13 LAB — BASIC METABOLIC PANEL WITH GFR
BUN: 11 mg/dL (ref 6–23)
CO2: 23 meq/L (ref 19–32)
Calcium: 9.8 mg/dL (ref 8.4–10.5)
Chloride: 97 meq/L (ref 96–112)
Creatinine, Ser: 0.77 mg/dL (ref 0.40–1.20)
GFR: 73.15 mL/min
Glucose, Bld: 115 mg/dL — ABNORMAL HIGH (ref 70–99)
Potassium: 4 meq/L (ref 3.5–5.1)
Sodium: 133 meq/L — ABNORMAL LOW (ref 135–145)

## 2021-02-13 MED ORDER — METOPROLOL SUCCINATE ER 50 MG PO TB24
50.0000 mg | ORAL_TABLET | Freq: Every day | ORAL | 0 refills | Status: DC
  Filled 2021-02-13: qty 90, 90d supply, fill #0

## 2021-02-13 NOTE — Assessment & Plan Note (Addendum)
BP Readings from Last 3 Encounters:  02/13/21 (!) 154/72  02/06/21 (!) 156/75  09/01/20 (!) 148/78   BP still high despite the addition of hctz 25mg  once daily. Will add toprol xl 50mg  once daily and plan follow up in 2 weeks.

## 2021-02-13 NOTE — Patient Instructions (Signed)
Please complete lab work prior to leaving. Please add Toprol xl 50mg  once daily.

## 2021-02-13 NOTE — Progress Notes (Signed)
-  Subjective:   By signing my name below, I, Shehryar Baig, attest that this documentation has been prepared under the direction and in the presence of [name]. 02/13/2021    Patient ID: [name], female    DOB: Jul 30, 1940, 80 y.o.   MRN: [id number]  Chief Complaint  Patient presents with   Hypertension    Here for follow up    Hypertension  Patient is in today for a office visit.  Blood pressure- Her blood pressure is elevated during this visit. She has increased back pain from her recent vacation and thinks it may be contributing to her higher blood pressure. During last visit she was prescribed 25 mg hydrochlorothiazide daily PO and reports having muscle cramps while taking it. She notes her cramps are manageable at this time and is willing to continue this medication. She continues taking 40 mg quinapril daily PO, 10 mg amlodipine daily PO and reports no new issues while taking them. She occasionally checks her blood pressure at home.   BP Readings from Last 3 Encounters:  02/13/21 (!) 154/72  02/06/21 (!) 156/75  09/01/20 (!) 148/78   Pulse Readings from Last 3 Encounters:  02/13/21 89  02/06/21 98  09/01/20 87   Prolia- She continues receiving prolia injections.  Exercise- She reports that she used to participate in exercise by doing water aerobics. She is planning to start going after her vacations. She participates in light exercise at this time.  Immunizations- She has received her Flu vaccine. She is interested in getting the new Covid-19 booster vaccine.    Health Maintenance Due  Topic Date Due   Hepatitis C Screening  Never done    Past Medical History:  Diagnosis Date   Arthritis    osteoarthritis   Barrett's esophagus    Cataract    bilateral cateracts removed   Chronic kidney disease    kidney stones   Chronic obstructive bronchitis (HCC)    Dr Delford Field   Colitis    Colon polyps    GERD (gastroesophageal reflux disease)     History of chicken pox    History of nephrolithiasis    History of transfusion of packed red blood cells    Hyperlipidemia    Hypertension    IFG (impaired fasting glucose)    Osteoporosis    pt cannot tolerate fosamax   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   Urinary incontinence     Past Surgical History:  Procedure Laterality Date   ABDOMINAL HYSTERECTOMY  1970   APPENDECTOMY     CHOLECYSTECTOMY     LEG SURGERY  2008   right   TOTAL KNEE ARTHROPLASTY  04/2006   total left knee replacement    Family History  Problem Relation Age of Onset   Coronary artery disease Mother    Arthritis Mother    Tuberculosis Other    Asthma Maternal Grandmother    Colon cancer Neg Hx    Esophageal cancer Neg Hx    Rectal cancer Neg Hx    Stomach cancer Neg Hx     Social History   Socioeconomic History   Marital status: Widowed    Spouse name: Not on file   Number of children: Not on file   Years of education: Not on file   Highest education level: Not on file  Occupational History   Occupation: Retired  Tobacco Use   Smoking status: Former  Years: 15.00    Types: Cigarettes    Quit date: 05/15/1967    Years since quitting: 53.7   Smokeless tobacco: Never  Substance and Sexual Activity   Alcohol use: No   Drug use: No   Sexual activity: Never  Other Topics Concern   Not on file  Social History Narrative   Regular exercise: yes   Social Determinants of Health   Financial Resource Strain: Low Risk    Difficulty of Paying Living Expenses: Not hard at all  Food Insecurity: No Food Insecurity   Worried About Programme researcher, broadcasting/film/video in the Last Year: Never true   Ran Out of Food in the Last Year: Never true  Transportation Needs: No Transportation Needs   Lack of Transportation (Medical): No   Lack of Transportation (Non-Medical): No  Physical Activity: Insufficiently Active   Days of Exercise per Week: 7 days   Minutes of  Exercise per Session: 20 min  Stress: No Stress Concern Present   Feeling of Stress : Not at all  Social Connections: Moderately Integrated   Frequency of Communication with Friends and Family: More than three times a week   Frequency of Social Gatherings with Friends and Family: More than three times a week   Attends Religious Services: More than 4 times per year   Active Member of [employer] or Organizations: Yes   Attends Banker Meetings: 1 to 4 times per year   Marital Status: Widowed  Catering manager Violence: Not At Risk   Fear of Current or Ex-Partner: No   Emotionally Abused: No   Physically Abused: No   Sexually Abused: No    Outpatient Medications Prior to Visit  Medication Sig Dispense Refill   amLODipine (NORVASC) 10 MG tablet TAKE 1 TABLET (10 MG TOTAL) BY MOUTH DAILY. 90 tablet 1   Ascorbic Acid (VITAMIN C) 1000 MG tablet Take 1,000 mg by mouth daily.     aspirin (ASPIRIN 81) 81 MG EC tablet Take 1 tablet (81 mg total) by mouth daily. Swallow whole. 30 tablet 12   Calcium Carbonate-Vitamin D 600-400 MG-UNIT tablet Take 1 tablet by mouth 2 (two) times daily.     carboxymethylcellulose (REFRESH PLUS) 0.5 % SOLN Place 1 drop into both eyes 2 (two) times daily.     Cholecalciferol (VITAMIN D) 125 MCG (5000 UT) CAPS Take 1 capsule by mouth daily.     Coenzyme Q10 200 MG capsule Take 200 mg by mouth daily.     ezetimibe (ZETIA) 10 MG tablet TAKE 1 TABLET BY MOUTH ONCE DAILY 90 tablet 1   hydrochlorothiazide (HYDRODIURIL) 25 MG tablet Take 1 tablet (25 mg total) by mouth daily. 90 tablet 0   ibuprofen (ADVIL,MOTRIN) 200 MG tablet Take 400 mg by mouth daily.     Multiple Vitamin (MULTIVITAMIN) tablet Take 1 tablet by mouth daily.     niacin 500 MG tablet Take 500 mg by mouth at bedtime.     Omega-3 Fatty Acids (FISH OIL) 1000 MG CAPS Take 1 capsule by mouth every morning.     omeprazole (PRILOSEC) 40 MG capsule Take 1 capsule (40 mg total) by mouth daily. 90 capsule 1    potassium chloride (KLOR-CON) 10 MEQ tablet Take 1 tablet (10 mEq total) by mouth daily. 30 tablet 1   quinapril (ACCUPRIL) 40 MG tablet Take 1 tablet (40 mg total) by mouth at bedtime. 90 tablet 1   vitamin E 180 MG (400 UNITS) capsule Take 400 Units by mouth  daily.     No facility-administered medications prior to visit.    Allergies  Allergen Reactions   Atorvastatin Other (See Comments)    REACTION: MUSCLE PAINS   Diclofenac-Misoprostol Diarrhea and Nausea And Vomiting   Fenofibrate Other (See Comments)    REACTION: carpopedal spasms   Rosuvastatin Other (See Comments)    REACTION: MUSCLE PAINS   Statins Other (See Comments)    myalgias   Amoxicillin-Pot Clavulanate Other (See Comments)    REACTION: unspecified   Penicillins Hives   Sulfonamide Derivatives Hives   Tuberculin Ppd Other (See Comments)    Redness at sight    ROS See HPI    Objective:    Physical Exam Constitutional:      General: She is not in acute distress.    Appearance: Normal appearance. She is not ill-appearing.  HENT:     Head: Normocephalic and atraumatic.     Right Ear: External ear normal.     Left Ear: External ear normal.  Eyes:     Extraocular Movements: Extraocular movements intact.     Pupils: Pupils are equal, round, and reactive to light.  Cardiovascular:     Rate and Rhythm: Normal rate and regular rhythm.     Heart sounds: Normal heart sounds. No murmur heard.   No gallop.     Comments: Her blood pressure measured 170/95 during the PE when taken in her right arm. Her blood pressure measured 170/85 during the PE when taken in her left arm. Pulmonary:     Effort: Pulmonary effort is normal. No respiratory distress.     Breath sounds: Normal breath sounds. No wheezing or rales.  Skin:    General: Skin is warm and dry.  Neurological:     Mental Status: She is alert and oriented to person, place, and time.  Psychiatric:        Behavior: Behavior normal.        Judgment:  Judgment normal.    BP (!) 154/72 (BP Location: Right Arm, Patient Position: Sitting, Cuff Size: Small)   Pulse 89   Temp 98.6 F (37 C) (Oral)   Resp 16   Wt 173 lb (78.5 kg)   LMP 05/14/1968   SpO2 99%   BMI 31.64 kg/m  Wt Readings from Last 3 Encounters:  02/13/21 173 lb (78.5 kg)  02/06/21 174 lb (78.9 kg)  09/01/20 166 lb 6.4 oz (75.5 kg)       Assessment & Plan:   Problem List Items Addressed This Visit       Unprioritized   Essential hypertension    BP Readings from Last 3 Encounters:  02/13/21 (!) 154/72  02/06/21 (!) 156/75  09/01/20 (!) 148/78  BP still high despite the addition of hctz 25mg  once daily. Will add toprol xl 50mg  once daily and plan follow up in 2 weeks.        Relevant Medications   metoprolol succinate (TOPROL-XL) 50 MG 24 hr tablet   Other Visit Diagnoses     Primary hypertension    -  Primary   Relevant Medications   metoprolol succinate (TOPROL-XL) 50 MG 24 hr tablet   Other Relevant Orders   Basic metabolic panel        Meds ordered this encounter  Medications   metoprolol succinate (TOPROL-XL) 50 MG 24 hr tablet    Sig: Take 1 tablet (50 mg total) by mouth daily. Take with or immediately following a meal.    Dispense:  90  tablet    Refill:  0    Order Specific Question:   Supervising Provider    Answer:   Danise Edge A [[id number]]    I, [name], personally preformed the services described in this documentation.  All medical record entries made by the scribe were at my direction and in my presence.  I have reviewed the chart and discharge instructions (if applicable) and agree that the record reflects my personal performance and is accurate and complete. 02/13/2021   I,Shehryar Baig,acting as a Neurosurgeon for Lemont Fillers, NP.,have documented all relevant documentation on the behalf of Lemont Fillers, NP,as directed by  Lemont Fillers, NP while in the presence of Lemont Fillers,  NP.   Lemont Fillers, NP

## 2021-02-13 NOTE — Progress Notes (Signed)
   Covid-19 Vaccination Clinic  Name:  [name]    MRN: [id number] DOB: [birth year]  02/13/2021  [name] was observed post Covid-19 immunization for 15 minutes without incident. She was provided with Vaccine Information Sheet and instruction to access the V-Safe system.   [name] was instructed to call 911 with any severe reactions post vaccine: Difficulty breathing  Swelling of face and throat  A fast heartbeat  A bad rash all over body  Dizziness and weakness

## 2021-02-13 NOTE — Assessment & Plan Note (Signed)
>>  ASSESSMENT AND PLAN FOR ESSENTIAL HYPERTENSION WRITTEN ON 02/13/2021  9:24 AM BY O'SULLIVAN, Somer Trotter, NP  BP Readings from Last 3 Encounters:  02/13/21 (!) 154/72  02/06/21 (!) 156/75  09/01/20 (!) 148/78   BP still high despite the addition of hctz 25mg  once daily. Will add toprol xl 50mg  once daily and plan follow up in 2 weeks.

## 2021-02-17 ENCOUNTER — Other Ambulatory Visit: Payer: Self-pay

## 2021-02-17 ENCOUNTER — Ambulatory Visit (INDEPENDENT_AMBULATORY_CARE_PROVIDER_SITE_OTHER): Payer: PPO

## 2021-02-17 DIAGNOSIS — M810 Age-related osteoporosis without current pathological fracture: Secondary | ICD-10-CM

## 2021-02-17 MED ORDER — DENOSUMAB 60 MG/ML ~~LOC~~ SOSY
60.0000 mg | PREFILLED_SYRINGE | Freq: Once | SUBCUTANEOUS | Status: AC
  Administered 2021-02-17: 60 mg via SUBCUTANEOUS

## 2021-02-17 NOTE — Progress Notes (Signed)
Pt is here today for prolia injection. Pt was given prolia injection in right subq. Pt tolerated well  Pt is scheduled for 08/22/21

## 2021-02-20 ENCOUNTER — Ambulatory Visit (HOSPITAL_BASED_OUTPATIENT_CLINIC_OR_DEPARTMENT_OTHER)
Admission: RE | Admit: 2021-02-20 | Discharge: 2021-02-20 | Disposition: A | Discharge status: Home / Self Care | Payer: PPO | Source: Ambulatory Visit | Attending: Family | Admitting: Family

## 2021-02-20 ENCOUNTER — Other Ambulatory Visit: Payer: Self-pay

## 2021-02-20 DIAGNOSIS — R011 Cardiac murmur, unspecified: Secondary | ICD-10-CM | POA: Insufficient documentation

## 2021-02-20 LAB — ECHOCARDIOGRAM COMPLETE
AR max vel: 2.13 cm2
AV Area VTI: 2.49 cm2
AV Area mean vel: 2.24 cm2
AV Mean grad: 4.3 mmHg
AV Peak grad: 8 mmHg
Ao pk vel: 1.42 m/s
Area-P 1/2: 3.2 cm2
Calc EF: 67.5 %
S' Lateral: 2.7 cm
Single Plane A2C EF: 64.2 %
Single Plane A4C EF: 67.9 %

## 2021-02-21 ENCOUNTER — Other Ambulatory Visit (HOSPITAL_BASED_OUTPATIENT_CLINIC_OR_DEPARTMENT_OTHER): Payer: Self-pay

## 2021-02-21 MED ORDER — COVID-19MRNA BIVAL VACC PFIZER 30 MCG/0.3ML IM SUSP
INTRAMUSCULAR | 0 refills | Status: DC
  Filled 2021-02-21: qty 0.3, 1d supply, fill #0

## 2021-03-02 ENCOUNTER — Telehealth: Payer: Self-pay

## 2021-03-02 NOTE — Chronic Care Management (AMB) (Signed)
Chronic Care Management Pharmacy Assistant   Name: [name]  MRN: [id number] DOB: 1940-12-22   Reason for Encounter: Disease State Hypertension    Recent office visits:  02/13/21 [name] - Seen for hypertension - Labs ordered - Start toprol xl 50mg  - Follow up in 2 weeks  02/06/21 02/08/21 NP - Seen for flu shot - Labs ordered - Start hctz 25mg  once daily - Start kdur 10 mEQ once daily - Change barretts esophagus from 20 mg caps to 40 mg once daily - Follow up in 1 week  Recent consult visits:  02/20/21 Cardiology - NP - Seen for echocardiogram   Hospital visits:  None in previous 6 months  Medications: Outpatient Encounter Medications as of 03/02/2021  Medication Sig   amLODipine (NORVASC) 10 MG tablet TAKE 1 TABLET (10 MG TOTAL) BY MOUTH DAILY.   Ascorbic Acid (VITAMIN C) 1000 MG tablet Take 1,000 mg by mouth daily.   aspirin (ASPIRIN 81) 81 MG EC tablet Take 1 tablet (81 mg total) by mouth daily. Swallow whole.   Calcium Carbonate-Vitamin D 600-400 MG-UNIT tablet Take 1 tablet by mouth 2 (two) times daily.   carboxymethylcellulose (REFRESH PLUS) 0.5 % SOLN Place 1 drop into both eyes 2 (two) times daily.   Cholecalciferol (VITAMIN D) 125 MCG (5000 UT) CAPS Take 1 capsule by mouth daily.   Coenzyme Q10 200 MG capsule Take 200 mg by mouth daily.   COVID-19 mRNA bivalent vaccine, Pfizer, injection Inject into the muscle.   ezetimibe (ZETIA) 10 MG tablet TAKE 1 TABLET BY MOUTH ONCE DAILY   hydrochlorothiazide (HYDRODIURIL) 25 MG tablet Take 1 tablet (25 mg total) by mouth daily.   ibuprofen (ADVIL,MOTRIN) 200 MG tablet Take 400 mg by mouth daily.   metoprolol succinate (TOPROL-XL) 50 MG 24 hr tablet Take 1 tablet (50 mg total) by mouth daily. Take with or immediately following a meal.   Multiple Vitamin (MULTIVITAMIN) tablet Take 1 tablet by mouth daily.   niacin 500 MG tablet Take 500 mg by mouth at bedtime.   Omega-3 Fatty  Acids (FISH OIL) 1000 MG CAPS Take 1 capsule by mouth every morning.   omeprazole (PRILOSEC) 40 MG capsule Take 1 capsule (40 mg total) by mouth daily.   potassium chloride (KLOR-CON) 10 MEQ tablet Take 1 tablet (10 mEq total) by mouth daily.   quinapril (ACCUPRIL) 40 MG tablet Take 1 tablet (40 mg total) by mouth at bedtime.   vitamin E 180 MG (400 UNITS) capsule Take 400 Units by mouth daily.   No facility-administered encounter medications on file as of 03/02/2021.   Reviewed chart prior to disease state call. Spoke with patient regarding BP  Recent Office Vitals: BP Readings from Last 3 Encounters:  02/13/21 (!) 154/72  02/06/21 (!) 156/75  09/01/20 (!) 148/78   Pulse Readings from Last 3 Encounters:  02/13/21 89  02/06/21 98  09/01/20 87    Wt Readings from Last 3 Encounters:  02/13/21 173 lb (78.5 kg)  02/06/21 174 lb (78.9 kg)  09/01/20 166 lb 6.4 oz (75.5 kg)     Kidney Function Lab Results  Component Value Date/Time   CREATININE 0.77 02/13/2021 09:30 AM   CREATININE 0.68 02/06/2021 09:09 AM   CREATININE 0.72 02/01/2020 08:34 AM   CREATININE 0.71 01/17/2018 02:06 PM   GFR 73.15 02/13/2021 09:30 AM   GFRNONAA 82 (L) 02/17/2014 05:45 AM   GFRNONAA 84 11/17/2013 09:15 AM   GFRAA >90 02/17/2014  05:45 AM   GFRAA >89 11/17/2013 09:15 AM    BMP Latest Ref Rng & Units 02/13/2021 02/06/2021 08/01/2020  Glucose 70 - 99 mg/dL 264(B) 583(E) 940(H)  BUN 6 - 23 mg/dL 11 10 10   Creatinine 0.40 - 1.20 mg/dL 6.80 8.81  BUN/Creat Ratio 6 - 22 (calc) - - -  Sodium 135 - 145 mEq/L 133(L) 139 135  Potassium 3.5 - 5.1 mEq/L 4.0 4.5 3.9  Chloride 96 - 112 mEq/L 97 102 99  CO2 19 - 32 mEq/L 23 27 25   Calcium 8.4 - 10.5 mg/dL 9.8 9.5 9.8    Current antihypertensive regimen:  Amlodipine 10mg  daily Quinapril 40mg  daily toprol xl 50mg  hctz 25mg   kdur 10 mEQ  How often are you checking your Blood Pressure?  Infrequently  Patient states that she does not check her blood  pressure at home often.  Current home BP readings: N/a What recent interventions/DTPs have been made by any provider to improve Blood Pressure control since last CPP Visit: N/a Any recent hospitalizations or ED visits since last visit with CPP? No What diet changes have been made to improve Blood Pressure Control?  Patient states that she watches her salt intake but occasionally enjoys chips once in a while.  What exercise is being done to improve your Blood Pressure Control?  Patient states that she usually walks on the treadmill but has recently stopped due to arthritis pain in her back. She gets a massage about once a month to help with her pain   Adherence Review: Is the patient currently on ACE/ARB medication? Yes Does the patient have >5 day gap between last estimated fill dates? No    Star Rating Drugs: ezetimibe (ZETIA) 10 MG tablet - Last filled: 12/28/20 90 DS, 09/30/2020 90 DS  quinapril (ACCUPRIL) 40 MG tablet- Last filled: 12/23/2020 90 DS, 09/27/2020 90 DS   , CMA

## 2021-03-06 ENCOUNTER — Other Ambulatory Visit (HOSPITAL_BASED_OUTPATIENT_CLINIC_OR_DEPARTMENT_OTHER): Payer: Self-pay

## 2021-03-06 ENCOUNTER — Other Ambulatory Visit: Payer: Self-pay

## 2021-03-06 ENCOUNTER — Ambulatory Visit (INDEPENDENT_AMBULATORY_CARE_PROVIDER_SITE_OTHER): Payer: PPO | Admitting: Family

## 2021-03-06 DIAGNOSIS — I10 Essential (primary) hypertension: Secondary | ICD-10-CM

## 2021-03-06 MED ORDER — POTASSIUM CHLORIDE CRYS ER 10 MEQ PO TBCR
10.0000 meq | EXTENDED_RELEASE_TABLET | Freq: Every day | ORAL | 1 refills | Status: DC
  Filled 2021-03-06: qty 90, 90d supply, fill #0
  Filled 2021-05-30: qty 90, 90d supply, fill #1

## 2021-03-06 NOTE — Progress Notes (Signed)
Subjective:   By signing my name below, I, Shehryar Baig, attest that this documentation has been prepared under the direction and in the presence of [name]. 03/06/2021    Patient ID: [name], female    DOB: 08/28/1940, 80 y.o.   MRN: [id number]  Chief Complaint  Patient presents with   Hypertension    Here for follow up    HPI Patient is in today for a office visit.   Blood pressure- Her blood pressure has improved since her last visit. She continues taking 25 mg hydrochlorothiazide daily PO, 50 mg metoprolol succinate daily PO, 10 mg amlodipine daily PO, potassium supplements and reports no new issues while taking it. She is requesting a refill on potassium supplements.   BP Readings from Last 3 Encounters:  03/06/21 (!) 140/59  02/13/21 (!) 154/72  02/06/21 (!) 156/75   Pulse Readings from Last 3 Encounters:  03/06/21 74  02/13/21 89  02/06/21 98    Health Maintenance Due  Topic Date Due   Hepatitis C Screening  Never done    Past Medical History:  Diagnosis Date   Arthritis    osteoarthritis   Barrett's esophagus    Cataract    bilateral cateracts removed   Chronic kidney disease    kidney stones   Chronic obstructive bronchitis (HCC)    Dr Delford Field   Colitis    Colon polyps    GERD (gastroesophageal reflux disease)    History of chicken pox    History of nephrolithiasis    History of transfusion of packed red blood cells    Hyperlipidemia    Hypertension    IFG (impaired fasting glucose)    Osteoporosis    pt cannot tolerate fosamax   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   Urinary incontinence     Past Surgical History:  Procedure Laterality Date   ABDOMINAL HYSTERECTOMY  1970   APPENDECTOMY     CHOLECYSTECTOMY     LEG SURGERY  2008   right   TOTAL KNEE ARTHROPLASTY  04/2006   total left knee replacement    Family History  Problem Relation Age of Onset   Coronary artery  disease Mother    Arthritis Mother    Tuberculosis Other    Asthma Maternal Grandmother    Colon cancer Neg Hx    Esophageal cancer Neg Hx    Rectal cancer Neg Hx    Stomach cancer Neg Hx     Social History   Socioeconomic History   Marital status: Widowed    Spouse name: Not on file   Number of children: Not on file   Years of education: Not on file   Highest education level: Not on file  Occupational History   Occupation: Retired  Tobacco Use   Smoking status: Former    Years: 15.00    Types: Cigarettes    Quit date: 05/15/1967    Years since quitting: 53.8   Smokeless tobacco: Never  Substance and Sexual Activity   Alcohol use: No   Drug use: No   Sexual activity: Never  Other Topics Concern   Not on file  Social History Narrative   Regular exercise: yes   Social Determinants of Health   Financial Resource Strain: Low Risk    Difficulty of Paying Living Expenses: Not hard at all  Food Insecurity: No Food Insecurity   Worried About Programme researcher, broadcasting/film/video  in the Last Year: Never true   Ran Out of Food in the Last Year: Never true  Transportation Needs: No Transportation Needs   Lack of Transportation (Medical): No   Lack of Transportation (Non-Medical): No  Physical Activity: Insufficiently Active   Days of Exercise per Week: 7 days   Minutes of Exercise per Session: 20 min  Stress: No Stress Concern Present   Feeling of Stress : Not at all  Social Connections: Moderately Integrated   Frequency of Communication with Friends and Family: More than three times a week   Frequency of Social Gatherings with Friends and Family: More than three times a week   Attends Religious Services: More than 4 times per year   Active Member of [employer] or Organizations: Yes   Attends Banker Meetings: 1 to 4 times per year   Marital Status: Widowed  Catering manager Violence: Not At Risk   Fear of Current or Ex-Partner: No   Emotionally Abused: No   Physically Abused:  No   Sexually Abused: No    Outpatient Medications Prior to Visit  Medication Sig Dispense Refill   amLODipine (NORVASC) 10 MG tablet TAKE 1 TABLET (10 MG TOTAL) BY MOUTH DAILY. 90 tablet 1   Ascorbic Acid (VITAMIN C) 1000 MG tablet Take 1,000 mg by mouth daily.     aspirin (ASPIRIN 81) 81 MG EC tablet Take 1 tablet (81 mg total) by mouth daily. Swallow whole. 30 tablet 12   Calcium Carbonate-Vitamin D 600-400 MG-UNIT tablet Take 1 tablet by mouth 2 (two) times daily.     carboxymethylcellulose (REFRESH PLUS) 0.5 % SOLN Place 1 drop into both eyes 2 (two) times daily.     Cholecalciferol (VITAMIN D) 125 MCG (5000 UT) CAPS Take 1 capsule by mouth daily.     Coenzyme Q10 200 MG capsule Take 200 mg by mouth daily.     COVID-19 mRNA bivalent vaccine, Pfizer, injection Inject into the muscle. 0.3 mL 0   ezetimibe (ZETIA) 10 MG tablet TAKE 1 TABLET BY MOUTH ONCE DAILY 90 tablet 1   hydrochlorothiazide (HYDRODIURIL) 25 MG tablet Take 1 tablet (25 mg total) by mouth daily. 90 tablet 0   ibuprofen (ADVIL,MOTRIN) 200 MG tablet Take 400 mg by mouth daily.     metoprolol succinate (TOPROL-XL) 50 MG 24 hr tablet Take 1 tablet (50 mg total) by mouth daily. Take with or immediately following a meal. 90 tablet 0   Multiple Vitamin (MULTIVITAMIN) tablet Take 1 tablet by mouth daily.     niacin 500 MG tablet Take 500 mg by mouth at bedtime.     Omega-3 Fatty Acids (FISH OIL) 1000 MG CAPS Take 1 capsule by mouth every morning.     omeprazole (PRILOSEC) 40 MG capsule Take 1 capsule (40 mg total) by mouth daily. 90 capsule 1   quinapril (ACCUPRIL) 40 MG tablet Take 1 tablet (40 mg total) by mouth at bedtime. 90 tablet 1   vitamin E 180 MG (400 UNITS) capsule Take 400 Units by mouth daily.     potassium chloride (KLOR-CON) 10 MEQ tablet Take 1 tablet (10 mEq total) by mouth daily. 30 tablet 1   No facility-administered medications prior to visit.    Allergies  Allergen Reactions   Atorvastatin Other (See  Comments)    REACTION: MUSCLE PAINS   Diclofenac-Misoprostol Diarrhea and Nausea And Vomiting   Fenofibrate Other (See Comments)    REACTION: carpopedal spasms   Rosuvastatin Other (See Comments)  REACTION: MUSCLE PAINS   Statins Other (See Comments)    myalgias   Amoxicillin-Pot Clavulanate Other (See Comments)    REACTION: unspecified   Penicillins Hives   Sulfonamide Derivatives Hives   Tuberculin Ppd Other (See Comments)    Redness at sight    ROS     Objective:    Physical Exam Constitutional:      General: She is not in acute distress.    Appearance: Normal appearance. She is not ill-appearing.  HENT:     Head: Normocephalic and atraumatic.     Right Ear: External ear normal.     Left Ear: External ear normal.  Eyes:     Extraocular Movements: Extraocular movements intact.     Pupils: Pupils are equal, round, and reactive to light.  Cardiovascular:     Rate and Rhythm: Normal rate and regular rhythm.     Heart sounds: Normal heart sounds. No murmur heard.   No gallop.  Pulmonary:     Effort: Pulmonary effort is normal. No respiratory distress.     Breath sounds: Normal breath sounds. No wheezing or rales.  Skin:    General: Skin is warm and dry.  Neurological:     Mental Status: She is alert and oriented to person, place, and time.  Psychiatric:        Behavior: Behavior normal.    BP (!) 140/59 (BP Location: Right Arm, Patient Position: Sitting, Cuff Size: Small)   Pulse 74   Temp 98.5 F (36.9 C) (Oral)   Resp 16   Wt 174 lb (78.9 kg)   LMP 05/14/1968   SpO2 99%   BMI 31.83 kg/m  Wt Readings from Last 3 Encounters:  03/06/21 174 lb (78.9 kg)  02/13/21 173 lb (78.5 kg)  02/06/21 174 lb (78.9 kg)       Assessment & Plan:   Problem List Items Addressed This Visit       Unprioritized   Essential hypertension    BP Readings from Last 3 Encounters:  03/06/21 (!) 140/59  02/13/21 (!) 154/72  02/06/21 (!) 156/75  Stable/improved. Continue  quinapril, metoprolol and hctz.          Meds ordered this encounter  Medications   potassium chloride (KLOR-CON) 10 MEQ tablet    Sig: Take 1 tablet (10 mEq total) by mouth daily.    Dispense:  90 tablet    Refill:  1    Order Specific Question:   Supervising Provider    Answer:   Danise Edge A [[id number]]    I, [name], personally preformed the services described in this documentation.  All medical record entries made by the scribe were at my direction and in my presence.  I have reviewed the chart and discharge instructions (if applicable) and agree that the record reflects my personal performance and is accurate and complete. 03/06/2021   I,Shehryar Baig,acting as a Neurosurgeon for Lemont Fillers, NP.,have documented all relevant documentation on the behalf of Lemont Fillers, NP,as directed by  Lemont Fillers, NP while in the presence of Lemont Fillers, NP.   Lemont Fillers, NP

## 2021-03-06 NOTE — Assessment & Plan Note (Addendum)
BP Readings from Last 3 Encounters:  03/06/21 (!) 140/59  02/13/21 (!) 154/72  02/06/21 (!) 156/75   Stable/improved. Continue quinapril, metoprolol and hctz.

## 2021-03-06 NOTE — Assessment & Plan Note (Signed)
>>  ASSESSMENT AND PLAN FOR ESSENTIAL HYPERTENSION WRITTEN ON 03/06/2021 10:11 AM BY O'SULLIVAN, Michal Callicott, NP  BP Readings from Last 3 Encounters:  03/06/21 (!) 140/59  02/13/21 (!) 154/72  02/06/21 (!) 156/75   Stable/improved. Continue quinapril, metoprolol and hctz.

## 2021-03-07 NOTE — Chronic Care Management (AMB) (Signed)
Patient would like to know if [name] has an update on the weight loss program through medicare. She stated that she talked about it with [name] at their last appointment and she wanted to know how long it would take to hear back from them.

## 2021-03-08 NOTE — Telephone Encounter (Signed)
It looks like her PCP sent a referral to the [facility]. It appears the referral is ready for appointment to be made. Patient can try calling the clinic schedule an appointment at [phone],

## 2021-03-09 NOTE — Chronic Care Management (AMB) (Signed)
Called patient to give relays [name]'s message  She wrote down the number to make and appointment and appreciates the call.

## 2021-03-27 ENCOUNTER — Other Ambulatory Visit (HOSPITAL_BASED_OUTPATIENT_CLINIC_OR_DEPARTMENT_OTHER): Payer: Self-pay

## 2021-03-27 ENCOUNTER — Other Ambulatory Visit: Payer: Self-pay | Admitting: Family

## 2021-03-27 MED ORDER — QUINAPRIL HCL 40 MG PO TABS
40.0000 mg | ORAL_TABLET | Freq: Every day | ORAL | 1 refills | Status: DC
  Filled 2021-03-27: qty 90, 90d supply, fill #0
  Filled 2021-06-27: qty 90, 90d supply, fill #1

## 2021-04-03 ENCOUNTER — Other Ambulatory Visit (HOSPITAL_BASED_OUTPATIENT_CLINIC_OR_DEPARTMENT_OTHER): Payer: Self-pay

## 2021-04-25 ENCOUNTER — Other Ambulatory Visit: Payer: Self-pay | Admitting: Family

## 2021-04-25 ENCOUNTER — Other Ambulatory Visit (HOSPITAL_BASED_OUTPATIENT_CLINIC_OR_DEPARTMENT_OTHER): Payer: Self-pay

## 2021-04-25 MED ORDER — HYDROCHLOROTHIAZIDE 25 MG PO TABS
25.0000 mg | ORAL_TABLET | Freq: Every day | ORAL | 1 refills | Status: DC
  Filled 2021-04-25: qty 90, 90d supply, fill #0
  Filled 2021-07-27: qty 90, 90d supply, fill #1

## 2021-04-25 MED ORDER — AMLODIPINE BESYLATE 10 MG PO TABS
ORAL_TABLET | Freq: Every day | ORAL | 1 refills | Status: DC
  Filled 2021-04-25: qty 90, 90d supply, fill #0
  Filled 2021-07-20: qty 90, 90d supply, fill #1

## 2021-05-09 ENCOUNTER — Other Ambulatory Visit: Payer: Self-pay | Admitting: Family

## 2021-05-09 ENCOUNTER — Other Ambulatory Visit (HOSPITAL_BASED_OUTPATIENT_CLINIC_OR_DEPARTMENT_OTHER): Payer: Self-pay

## 2021-05-09 MED ORDER — METOPROLOL SUCCINATE ER 50 MG PO TB24
50.0000 mg | ORAL_TABLET | Freq: Every day | ORAL | 0 refills | Status: DC
  Filled 2021-05-09: qty 90, 90d supply, fill #0

## 2021-05-23 ENCOUNTER — Other Ambulatory Visit (HOSPITAL_BASED_OUTPATIENT_CLINIC_OR_DEPARTMENT_OTHER): Payer: Self-pay

## 2021-05-23 ENCOUNTER — Ambulatory Visit (INDEPENDENT_AMBULATORY_CARE_PROVIDER_SITE_OTHER): Payer: PPO | Admitting: Pharmacist

## 2021-05-23 DIAGNOSIS — M810 Age-related osteoporosis without current pathological fracture: Secondary | ICD-10-CM

## 2021-05-23 DIAGNOSIS — I10 Essential (primary) hypertension: Secondary | ICD-10-CM

## 2021-05-23 DIAGNOSIS — E782 Mixed hyperlipidemia: Secondary | ICD-10-CM

## 2021-05-23 NOTE — Patient Instructions (Signed)
Mrs. Bold It was a pleasure speaking with you today.  I have attached a summary of our visit today and information about your health goals.   Patient Goals/Self-Care Activities Over the next 180 days, patient will:  take medications as prescribed,  check blood pressure 2 to 3 times per week and document, and provide at future appointments, and  Try to restart exercise - start with 5 to 10 minutes and increase as able. Stop the following supplements; CoEzyme Q10, vitamin E, Vitamin D (in with calcium supplement) and vitamin C.  Take hydrochlorothiazide and metoprolol succinate 50mg  in the morning Take quinapril and amlodipine in the evening / bedtime  Our next appointment is by telephone on October 20, 2021 at 1:00 pm  Please call the care guide team at [phone] if you need to cancel or reschedule your appointment.     If you have any questions or concerns, please feel free to contact me either at the phone number below or with a MyChart message.   Keep up the good work!  [phone], PharmD Clinical Pharmacist [facility] [phone] (direct line)  [phone] (main office number)  CARE PLAN ENTRY Updated 05/23/2021 Hypertension BP Readings from Last 3 Encounters:  03/06/21 (!) 140/59  02/13/21 (!) 154/72  02/06/21 (!) 156/75   Pharmacist Clinical Goal(s): Over the next 90 days, patient will work with PharmD and providers to achieve BP goal <140/90 Current regimen:  Amlodipine 10mg  daily Quinapril 40mg  daily in evening Hydrochlorothiazide 25mg  daily  Metoprolol succinate ER 50mg  daily with food. Interventions: Discussed blood pressure  goal Reviewed refill history for blood pressure medications Patient self care activities - Over the next 90 days, patient will: Check blood pressure 2 to 3 times per week, document, and provide at future appointments Ensure daily salt intake < 2300 mg/day Recommend take hydrochlorothiazide and  metoprolol succinate each morning  Recommend take quinapril and amlodipine each evening / bedtime   Hyperlipidemia Lab Results  Component Value Date/Time   LDLCALC 170 (H) 02/01/2020 08:34 AM   LDLDIRECT 189.0 02/06/2021 09:09 AM   Pharmacist Clinical Goal(s): Over the next 90 days, patient will work with PharmD and providers to achieve LDL goal < 100 or prevent further increase in LDL Current regimen:  Ezetimibe 10mg  daily Krill Oil 350mg  daily Niacin 500mg  daily Interventions: Discussed diet and exercise Discussed LDL goal  Patient self care activities - Over the next 90 days, patient will: Maintain cholesterol medication regimen.  Restart exercise as able and restrict serving sizes, especially for foods with high to moderate fat content Maintain cholesterol medication regimen except can stop CoEnzyme Q10 since you are no longer taking statin (COEnzyme Q10 is usually recommended to lower risk of muscle aches with statin medications)  Recheck lipids in 6 months  Osteoporosis (Goal prevention of fractures / stabalize bone minteral density) Pharmacist Clinical Goal(s) Over the next 90 days, patient will work with PharmD and providers to prevent fractures and falls  Current treatment  Prolia 60mg  subcutaneously every 6 months - started 11/01/2018 Calcium + D 600/400mg  daily Interventions: Recommend [phone] units of vitamin D daily.  Recommend 1200 mg of calcium daily from dietary and supplemental sources. Recommend weight-bearing and muscle strengthening exercises for building and maintaining bone density. Reviewed DEXA - up to date - done 07/2020 Patient self care activities - Over the next 90 days, patient will: Continue current therapy for osteoporosis Follow fall prevention tips  Medication management Pharmacist Clinical Goal(s): Over the  next 90 days, patient will work with PharmD and providers to maintain optimal medication adherence Current pharmacy: [facility] Interventions Comprehensive medication review performed. Continue current medication management strategy Patient self care activities - Over the next 90 days, patient will: Focus on medication adherence by filling and taking medications appropriately  Take medications as prescribed Report any questions or concerns to PharmD and/or provider(s)   The patient verbalized understanding of instructions, educational materials, and care plan provided today and agreed to receive a mailed copy of patient instructions, educational materials, and care plan.

## 2021-05-23 NOTE — Chronic Care Management (AMB) (Signed)
Chronic Care Management Pharmacy Note  05/23/2021 Name:  [name] MRN:  [id number] DOB:  1940/07/06  Summary:  Patient would like to decreased number of supplements she is taking. She is taking MVI and calcium + D; Recommended she could stop the following supplements; CoEzyme Q10, vitamin E, Vitamin D (in with calcium supplement) and vitamin C.  Recommend checking serum vitamin D in 3 to 6 months (if need can restart vitamin D supplement)  LDL still very elevated. Discussed retrying pravastatin since she tolerated in past but just want not "strong enough" but patient declined to retry ANY statin.  Also discussed best time to take blood pressure medications. Recommended:  hydrochlorothiazide and metoprolol succinate each morning  quinapril and amlodipine each evening / bedtime  Subjective: [name] is an 81 y.o. year old female who is a primary patient of [name], NP.  The CCM team was consulted for assistance with disease management and care coordination needs.    Engaged with patient by telephone for follow up visit in response to provider referral for pharmacy case management and/or care coordination services.   Consent to Services:  The patient was given information about Chronic Care Management services, agreed to services, and gave verbal consent prior to initiation of services.  Please see initial visit note for detailed documentation.   Patient Care Team: [name], NP as PCP - General Olevia Perches Lowella Bandy, MD (Inactive) as Consulting Physician (Gastroenterology) Suella Broad, MD as Consulting Physician (Physical Medicine and Rehabilitation) Cherre Robins, [place] (Pharmacist)  Recent office visits: 03/06/2021 - Int Med [facility]) Follow up HTN. BP was 140/59. BP improved. No med changes.  02/17/2021 - Received Prolia injection in office.  02/13/2021 - Int Med [facility]) F/U HTN. BP still elevated at 154/72. Added metoprolol succinate  68m daiy.  02/06/2021 - Int Med ([facility] F/U HTN. BP was elevated at 156/75; Added HCTZ 278mdaily and potassium ER 10 MEq daily.  Referred to healthy weight loss clinic.  Recent consult visits: None since last CCM visit  Hospital visits: None in previous 6 months  Objective:  Lab Results  Component Value Date   CREATININE 0.77 02/13/2021   CREATININE 0.68 02/06/2021   CREATININE 0.66 08/01/2020    Lab Results  Component Value Date   HGBA1C 5.8 02/06/2021   Last diabetic Eye exam: No results found for: HMDIABEYEEXA  Last diabetic Foot exam: No results found for: HMDIABFOOTEX      Component Value Date/Time   CHOL 285 (H) 02/06/2021 0909   TRIG 203.0 (H) 02/06/2021 0909   HDL 62.10 02/06/2021 0909   CHOLHDL 5 02/06/2021 0909   VLDL 40.6 (H) 02/06/2021 0909   LDLCALC 170 (H) 02/01/2020 0834   LDLDIRECT 189.0 02/06/2021 0909    Hepatic Function Latest Ref Rng & Units 02/06/2021 02/01/2020 01/20/2019  Total Protein 6.0 - 8.3 g/dL 6.5 6.5 6.4  Albumin 3.5 - 5.2 g/dL 4.2 - 4.3  AST 0 - 37 U/L '18 19 22  ' ALT 0 - 35 U/L '20 22 28  ' Alk Phosphatase 39 - 117 U/L 65 - 61  Total Bilirubin 0.2 - 1.2 mg/dL 0.5 0.4 0.5  Bilirubin, Direct 0.0 - 0.3 mg/dL - - -    Lab Results  Component Value Date/Time   TSH 2.25 06/10/2008 08:23 AM   TSH 1.47 04/01/2007 12:00 AM    CBC Latest Ref Rng & Units 11/18/2015 02/26/2014 02/17/2014  WBC 4.0 - 10.5 K/uL 9.8 10.0 18.3(H)  Hemoglobin 12.0 -  15.0 g/dL 14.3 13.0 12.1  Hematocrit 36.0 - 46.0 % 42.9 40.0 35.5(L)  Platelets 150.0 - 400.0 K/uL 370.0 623.0(H) 344    Lab Results  Component Value Date/Time   VD25OH 49.28 08/01/2020 08:34 AM   VD25OH 48 08/27/2012 08:48 AM    Clinical ASCVD: No  The ASCVD Risk score (Arnett DK, et al., 2019) failed to calculate for the following reasons:   The 2019 ASCVD risk score is only valid for ages 10 to 16     Social History   Tobacco Use  Smoking Status Former   Years: 15.00   Types:  Cigarettes   Quit date: 05/15/1967   Years since quitting: 54.0  Smokeless Tobacco Never   BP Readings from Last 3 Encounters:  03/06/21 (!) 140/59  02/13/21 (!) 154/72  02/06/21 (!) 156/75   Pulse Readings from Last 3 Encounters:  03/06/21 74  02/13/21 89  02/06/21 98   Wt Readings from Last 3 Encounters:  03/06/21 174 lb (78.9 kg)  02/13/21 173 lb (78.5 kg)  02/06/21 174 lb (78.9 kg)    Assessment: Review of patient past medical history, allergies, medications, health status, including review of consultants reports, laboratory and other test data, was performed as part of comprehensive evaluation and provision of chronic care management services.   SDOH:  (Social Determinants of Health) assessments and interventions performed:  SDOH Interventions    Flowsheet Row Most Recent Value  SDOH Interventions   Financial Strain Interventions Intervention Not Indicated  Physical Activity Interventions Patient Refused  [patient injured back about 4 months ago and has not restarted exercise yet.]       CCM Care Plan  Allergies  Allergen Reactions   Atorvastatin Other (See Comments)    REACTION: MUSCLE PAINS   Diclofenac-Misoprostol Diarrhea and Nausea And Vomiting   Fenofibrate Other (See Comments)    REACTION: carpopedal spasms   Rosuvastatin Other (See Comments)    REACTION: MUSCLE PAINS   Statins Other (See Comments)    myalgias   Amoxicillin-Pot Clavulanate Other (See Comments)    REACTION: unspecified   Penicillins Hives   Sulfonamide Derivatives Hives   Tuberculin Ppd Other (See Comments)    Redness at sight    Medications Reviewed Today     Reviewed by Cherre Robins, RPH-CPP (Pharmacist) on 05/23/21 at 1430  Med List Status: <None>   Medication Order Taking? Sig Documenting Provider Last Dose Status Informant  amLODipine (NORVASC) 10 MG tablet [id number] Yes TAKE 1 TABLET (10 MG TOTAL) BY MOUTH DAILY. [name], NP Taking Active   Ascorbic Acid  (VITAMIN C) 1000 MG tablet [id number] Yes Take 1,000 mg by mouth daily. [provider] Taking Active Self  aspirin (ASPIRIN 81) 81 MG EC tablet [id number] Yes Take 1 tablet (81 mg total) by mouth daily. Swallow whole. [name], NP Taking Active   Calcium Carbonate-Vitamin D 600-400 MG-UNIT tablet [id number] Yes Take 1 tablet by mouth 2 (two) times daily. [provider] Taking Active Self  carboxymethylcellulose (REFRESH PLUS) 0.5 % SOLN [id number] Yes Place 1 drop into both eyes 2 (two) times daily. [provider] Taking Active Self  Cholecalciferol (VITAMIN D) 125 MCG (5000 UT) CAPS [id number] Yes Take 1 capsule by mouth daily. [provider] Taking Active   Coenzyme Q10 200 MG capsule [id number] Yes Take 200 mg by mouth daily. [provider] Taking Active Self  denosumab (PROLIA) 60 MG/ML SOSY injection [id number] Yes Inject 60 mg into the skin every 6 (six) months.  [provider] Taking Active   ezetimibe (ZETIA) 10 MG tablet [id number] Yes TAKE 1 TABLET BY MOUTH ONCE DAILY [name], NP Taking Active   hydrochlorothiazide (HYDRODIURIL) 25 MG tablet [id number] Yes Take 1 tablet (25 mg total) by mouth daily. [name], NP Taking Active   ibuprofen (ADVIL,MOTRIN) 200 MG tablet [id number] Yes Take 400 mg by mouth daily. [provider] Taking Active   metoprolol succinate (TOPROL-XL) 50 MG 24 hr tablet [id number] Yes Take 1 tablet (50 mg total) by mouth daily. Take with or immediately following a meal. [name], NP Taking Active   Multiple Vitamin (MULTIVITAMIN) tablet [id number] Yes Take 1 tablet by mouth daily. [provider] Taking Active Self  niacin 500 MG tablet [id number] Yes Take 500 mg by mouth at bedtime. [provider] Taking Active Self           Med Note [place]   Fri Apr 29, 2020  9:20 AM)    Omega-3 Fatty Acids (FISH OIL) 1000 MG CAPS [id number] Yes Take 1 capsule by  mouth every morning. [provider] Taking Active Self  omeprazole (PRILOSEC) 40 MG capsule [id number] Yes Take 1 capsule (40 mg total) by mouth daily. [name], NP Taking Active   potassium chloride (KLOR-CON) 10 MEQ tablet [id number] Yes Take 1 tablet (10 mEq total) by mouth daily. [name], NP Taking Active   quinapril (ACCUPRIL) 40 MG tablet [id number] Yes Take 1 tablet (40 mg total) by mouth at bedtime. [name], NP Taking Active   vitamin E 180 MG (400 UNITS) capsule [id number] Yes Take 400 Units by mouth daily. [provider] Taking Active Self            Patient Active Problem List   Diagnosis Date Noted   Preventative health care 12/14/2014   Hyponatremia 02/20/2014   Hypokalemia 02/20/2014   Hyperglycemia 11/17/2013   Need for prophylactic vaccination and inoculation against influenza 02/08/2012   HYPERCHOLESTEROLEMIA 04/01/2008   BARRETTS ESOPHAGUS 04/01/2008   OSTEOARTHRITIS 04/01/2008   URINARY INCONTINENCE 04/01/2008   TOBACCO ABUSE, HX OF 04/01/2008   GERD 09/24/2007   Essential hypertension 04/01/2007   Osteoporosis 05/31/2006   COLONIC POLYPS, HX OF 05/31/2006   NEPHROLITHIASIS, HX OF 05/31/2006    Immunization History  Administered Date(s) Administered   Fluad Quad(high Dose 65+) 02/25/2019, 02/01/2020, 02/06/2021   Influenza Split 04/18/2011, 02/08/2012   Influenza Whole 04/01/2007, 02/02/2008, 01/31/2009, 02/20/2010   Influenza, High Dose Seasonal PF 04/01/2018   Influenza,inj,Quad PF,6+ Mos 02/11/2014, 01/28/2015, 03/27/2016   Influenza-Unspecified 03/14/2013, 03/05/2017   PFIZER(Purple Top)SARS-COV-2 Vaccination 06/03/2019, 06/24/2019, 03/03/2020, 09/29/2020   Pfizer Covid-19 Vaccine Bivalent Booster 33yr & up 02/13/2021   Pneumococcal Conjugate-13 03/23/2015   Pneumococcal Polysaccharide-23 07/24/2006, 02/25/2019   Td 03/19/2002, 05/14/2006   Tdap 07/12/2020   Zoster Recombinat (Shingrix) 04/02/2017,  06/02/2017    Conditions to be addressed/monitored: HTN, HLD, and osteoporosis, GERD  Care Plan : General Pharmacy (Adult)  Updates made by ECherre Robins RPH-CPP since 05/23/2021 12:00 AM     Problem: HTN; HDL; osteoporosis; GERD   Note:   Current Barriers:  Unable to achieve control of hyperlipidemia due to statin intolerance   Pharmacist Clinical Goal(s):  Over the next 180 days, patient will achieve control of hyperlipidmeia as evidenced by LDL <100 maintain control of hypertension as evidenced by BP <140/90  through collaboration with PharmD and provider.   Interventions: 1:1 collaboration with ODebbrah Alar NP regarding development and update of comprehensive plan of care as  evidenced by provider attestation and co-signature Inter-disciplinary care team collaboration (see longitudinal plan of care) Comprehensive medication review performed; medication list updated in electronic medical record  Hypertension Improving control; BP goal <140/90 Patient states she has not checked blood pressure in the last 2 weeks.  Denies dizziness Current regimen:  Amlodipine 28m daily Quinapril 468mdaily at bedtime Hydrochlorothiazide 2552maily  Metoprolol succinate ER 39m59mily with food. Interventions: Discussed blood pressure  goal Reviewed refill history for blood pressure medications Reocmmended patient check blood pressure every other day, document, and provide at future appointments Ensure daily salt intake < 2300 mg/day  Hyperlipidemia Not at goal;  LDL goal < 100 or prevent further increase in LDL Current regimen:  Ezetimibe 10mg63mly Krill Oil 339mg 64my Niacin 500mg d32m Past medication tried: fenofibrate - caused carpopedal spasms; atorvastatin, simvastatin and rosuvastatin all caused myalgias Patient also had taken pravastatin 40mg in24m9 but was changed due to not "strong enough"; No notation of intolerance.  Has not been able to exercise due to back  pain Interventions: Discussed diet and exercise Discussed LDL goal  Discussed retrying pravastatin since it appears she tolerated in past. Patient declined ANY statin due to past experience / myalgias Maintain cholesterol medication regimen. I did recommend she could stop CoEnzyme Q10 since she is not taking statin. Recheck lipids in 6 months   Osteoporosis (Goal prevention of fractures / stabalize bone minteral density) Goal: prevent fractures and falls  Current treatment  Prolia 60mg sub41mneously every 6 months - started 11/01/2018 / last dose 02/17/2021 Calcium + D 600/400mg dail45mtamin D 2000 IU daily  Daily Multivitamin  Last DEXA Scan: 08/08/2020 Left Femur Total 08/08/2020 Normal -0.3  Left Femur Total 07/23/2018 Normal -0.5  Left Femur Total 02/01/2015 Normal -0.6  Right Forearm Radius 33% 08/08/2020 Osteoporosis -3.8  Right Forearm Radius 33% 07/23/2018 Osteoporosis -3.6   Medications previously tried: alendronate Next Prolia injection due around 08/2021 - has appointment Interventions: Recommend [phone] units of vitamin D daily.  Recommend 1200 mg of calcium daily from dietary and supplemental sources.  Recommend weight-bearing and muscle strengthening exercises for building and maintaining bone density as able Due to have DEXA rechecked (completed 07/2020) Fall prevention discussed (addressed at previous visit)   Medication management [facility] Comprehensive medication review performed. Continue current medication management strategy) Reviewed recent medication refill history - patient is filling maintenance meds on time Discussed current supplements - recommended consider stopping the following: CoEZyme Q10, vitamin E, Vitamin D (in with calcium supplement) and vitamin C.  Recheck vitamin D level in 3 to 6 months.    Patient Goals/Self-Care Activities Over the next 180 days, patient will:  take  medications as prescribed,  check blood pressure 2 to 3 times per week and document, and provide at future appointments, and  Try to restart exercise - start with 5 to 10 minutes and increase as able. Stop the following supplements; CoEzyme Q10, vitamin E, Vitamin D (in with calcium supplement) and vitamin C.   Follow Up Plan: Telephone follow up appointment with care management team member scheduled for:  6 months        Medication Assistance: None required.  Patient affirms current coverage meets needs.  [phone] [place]  [place] [address] Phone: [phone] Fax: [phone]   Follow Up:  Patient agrees to Care Plan and Follow-up.  Plan: Telephone follow up appointment with care management team member scheduled for:  6 months  Cherre Robins, PharmD Clinical Pharmacist [facility] [place] [phone]

## 2021-05-30 ENCOUNTER — Other Ambulatory Visit (HOSPITAL_BASED_OUTPATIENT_CLINIC_OR_DEPARTMENT_OTHER): Payer: Self-pay

## 2021-06-13 DIAGNOSIS — I10 Essential (primary) hypertension: Secondary | ICD-10-CM | POA: Diagnosis not present

## 2021-06-13 DIAGNOSIS — M810 Age-related osteoporosis without current pathological fracture: Secondary | ICD-10-CM | POA: Diagnosis not present

## 2021-06-13 DIAGNOSIS — E782 Mixed hyperlipidemia: Secondary | ICD-10-CM

## 2021-06-27 ENCOUNTER — Other Ambulatory Visit (HOSPITAL_BASED_OUTPATIENT_CLINIC_OR_DEPARTMENT_OTHER): Payer: Self-pay

## 2021-07-03 ENCOUNTER — Other Ambulatory Visit: Payer: Self-pay | Admitting: Family

## 2021-07-03 ENCOUNTER — Other Ambulatory Visit (HOSPITAL_BASED_OUTPATIENT_CLINIC_OR_DEPARTMENT_OTHER): Payer: Self-pay

## 2021-07-03 MED ORDER — EZETIMIBE 10 MG PO TABS
10.0000 mg | ORAL_TABLET | Freq: Every day | ORAL | 1 refills | Status: DC
  Filled 2021-07-03: qty 90, 90d supply, fill #0
  Filled 2021-10-02: qty 90, 90d supply, fill #1

## 2021-07-13 ENCOUNTER — Telehealth: Payer: Self-pay

## 2021-07-13 NOTE — Telephone Encounter (Signed)
Prolia VOB initiated via [email or web address] ? ?Last OV:  ?Next OV:  ?Last Prolia inj: 02/17/21 ?Next Prolia inj DUE: 08/19/21 ? ?

## 2021-07-20 ENCOUNTER — Other Ambulatory Visit (HOSPITAL_BASED_OUTPATIENT_CLINIC_OR_DEPARTMENT_OTHER): Payer: Self-pay

## 2021-07-21 NOTE — Telephone Encounter (Signed)
Pt ready for scheduling on or after 08/19/21 ? ?Out-of-pocket cost due at time of visit: $301 ? ?Primary: [employer] ?Prolia co-insurance: 20% (approximately $276) ?Admin fee co-insurance: 20% (approximately $25) ? ?Secondary: n/a ?Prolia co-insurance:  ?Admin fee co-insurance:  ? ?Deductible: does not apply ? ?Prior Auth: not required ?PA# ?Valid:  ? ?** This summary of benefits is an estimation of the patient's out-of-pocket cost. Exact cost may vary based on individual plan coverage.  ? ?

## 2021-07-25 NOTE — Telephone Encounter (Signed)
Pt scheduled  

## 2021-07-27 ENCOUNTER — Other Ambulatory Visit (HOSPITAL_BASED_OUTPATIENT_CLINIC_OR_DEPARTMENT_OTHER): Payer: Self-pay

## 2021-08-01 ENCOUNTER — Other Ambulatory Visit (HOSPITAL_BASED_OUTPATIENT_CLINIC_OR_DEPARTMENT_OTHER): Payer: Self-pay

## 2021-08-01 ENCOUNTER — Other Ambulatory Visit: Payer: Self-pay | Admitting: Family

## 2021-08-01 MED ORDER — METOPROLOL SUCCINATE ER 50 MG PO TB24
50.0000 mg | ORAL_TABLET | Freq: Every day | ORAL | 0 refills | Status: DC
  Filled 2021-08-02: qty 90, 90d supply, fill #0

## 2021-08-02 ENCOUNTER — Other Ambulatory Visit (HOSPITAL_BASED_OUTPATIENT_CLINIC_OR_DEPARTMENT_OTHER): Payer: Self-pay

## 2021-08-21 ENCOUNTER — Other Ambulatory Visit (HOSPITAL_BASED_OUTPATIENT_CLINIC_OR_DEPARTMENT_OTHER): Payer: Self-pay

## 2021-08-21 ENCOUNTER — Other Ambulatory Visit: Payer: Self-pay | Admitting: Family

## 2021-08-21 DIAGNOSIS — Z0289 Encounter for other administrative examinations: Secondary | ICD-10-CM

## 2021-08-21 MED ORDER — OMEPRAZOLE 40 MG PO CPDR
40.0000 mg | DELAYED_RELEASE_CAPSULE | Freq: Every day | ORAL | 1 refills | Status: DC
  Filled 2021-08-21: qty 90, 90d supply, fill #0
  Filled 2021-11-15: qty 90, 90d supply, fill #1

## 2021-08-22 ENCOUNTER — Ambulatory Visit: Payer: PPO

## 2021-08-22 ENCOUNTER — Other Ambulatory Visit (HOSPITAL_BASED_OUTPATIENT_CLINIC_OR_DEPARTMENT_OTHER): Payer: Self-pay | Admitting: Family

## 2021-08-22 DIAGNOSIS — Z1231 Encounter for screening mammogram for malignant neoplasm of breast: Secondary | ICD-10-CM

## 2021-08-22 DIAGNOSIS — M810 Age-related osteoporosis without current pathological fracture: Secondary | ICD-10-CM | POA: Diagnosis not present

## 2021-08-22 MED ORDER — DENOSUMAB 60 MG/ML ~~LOC~~ SOSY
60.0000 mg | PREFILLED_SYRINGE | Freq: Once | SUBCUTANEOUS | Status: AC
  Administered 2021-08-22: 60 mg via SUBCUTANEOUS

## 2021-08-22 NOTE — Progress Notes (Signed)
Pt is here today for prolia injection. Pt was given prolia injection in right subq. Pt tolerated well ? ?Pt is scheduled for 02/21/22 ?

## 2021-08-23 ENCOUNTER — Other Ambulatory Visit: Payer: Self-pay | Admitting: Family

## 2021-08-23 ENCOUNTER — Other Ambulatory Visit (HOSPITAL_BASED_OUTPATIENT_CLINIC_OR_DEPARTMENT_OTHER): Payer: Self-pay

## 2021-08-23 MED ORDER — POTASSIUM CHLORIDE CRYS ER 10 MEQ PO TBCR
10.0000 meq | EXTENDED_RELEASE_TABLET | Freq: Every day | ORAL | 1 refills | Status: DC
  Filled 2021-08-23: qty 90, 90d supply, fill #0

## 2021-08-24 ENCOUNTER — Other Ambulatory Visit (HOSPITAL_BASED_OUTPATIENT_CLINIC_OR_DEPARTMENT_OTHER): Payer: Self-pay

## 2021-08-25 ENCOUNTER — Ambulatory Visit (INDEPENDENT_AMBULATORY_CARE_PROVIDER_SITE_OTHER): Payer: PPO | Admitting: Family

## 2021-08-25 ENCOUNTER — Other Ambulatory Visit (HOSPITAL_BASED_OUTPATIENT_CLINIC_OR_DEPARTMENT_OTHER): Payer: Self-pay

## 2021-08-25 VITALS — BP 132/64 | HR 69 | Temp 98.5°F | Resp 16 | Wt 179.0 lb

## 2021-08-25 DIAGNOSIS — M5416 Radiculopathy, lumbar region: Secondary | ICD-10-CM | POA: Insufficient documentation

## 2021-08-25 MED ORDER — FLUCONAZOLE 150 MG PO TABS
ORAL_TABLET | ORAL | 0 refills | Status: DC
  Filled 2021-08-25: qty 1, 1d supply, fill #0

## 2021-08-25 MED ORDER — METHYLPREDNISOLONE 4 MG PO TBPK
ORAL_TABLET | ORAL | 0 refills | Status: DC
  Filled 2021-08-25: qty 21, 6d supply, fill #0

## 2021-08-25 NOTE — Patient Instructions (Signed)
Please begin medrol dose pak.  ?Call if symptoms worsen or if symptoms do not improve.  ?

## 2021-08-25 NOTE — Assessment & Plan Note (Addendum)
Will rx with medrol dose pak. She is advised to call if symptoms worsen or if symptoms do not improve. States she is prone to oral thrush with steroids and does not like oral antifungals. Requesting rx for diflucan.  ?

## 2021-08-25 NOTE — Progress Notes (Addendum)
? ?Subjective:  ? ?By signing my name below, I, [name], attest that this documentation has been prepared under the direction and in the presence of [name], [name] NP, 08/25/2021   ? ? Patient ID: [name], female    DOB: 23-Oct-1940, 81 y.o.   MRN: [id number] ? ?Chief Complaint  ?Patient presents with  ? Back Pain  ?  Complains of lower back pain on the right going to right hip and down right leg  ? ? ?HPI ?Patient is in today for an office visit. ? ?Right Lumbar Pain - She complains of pain in her right lumbar that radiates down her right leg. She states that she can work for 10 minutes before having to sit down due to pain. She requests to use Prednisone to help alleviate symptoms.  ? ?Nausea - She complains of intermittent nausea.  She noted that when she coughs, she will gag and then is left feeling nauseas.  ? ?Weight Management - She states that her weight management visit is scheduled for 08/29/2021. She is looking forward to this.  ? ?Health Maintenance Due  ?Topic Date Due  ? HEMOGLOBIN A1C  08/06/2021  ? ? ?Past Medical History:  ?Diagnosis Date  ? Arthritis   ? osteoarthritis  ? Barrett's esophagus   ? Cataract   ? bilateral cateracts removed  ? Chronic kidney disease   ? kidney stones  ? Chronic obstructive bronchitis (HCC)   ? Dr Delford FieldWright  ? Colitis   ? Colon polyps   ? GERD (gastroesophageal reflux disease)   ? History of chicken pox   ? History of nephrolithiasis   ? History of transfusion of packed red blood cells   ? Hyperlipidemia   ? Hypertension   ? IFG (impaired fasting glucose)   ? Osteoporosis   ? pt cannot tolerate fosamax  ? Positive PPD   ? exposure to 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 Positive PPD only, negative CXR.  ? Urinary incontinence   ? ? ?Past Surgical History:  ?Procedure Laterality Date  ? ABDOMINAL HYSTERECTOMY  1970  ? APPENDECTOMY    ? CHOLECYSTECTOMY    ? LEG SURGERY  2008  ? right  ? TOTAL KNEE ARTHROPLASTY  04/2006  ? total left knee replacement  ? ? ?Family History   ?Problem Relation Age of Onset  ? Coronary artery disease Mother   ? Arthritis Mother   ? Tuberculosis Other   ? Asthma Maternal Grandmother   ? Colon cancer Neg Hx   ? Esophageal cancer Neg Hx   ? Rectal cancer Neg Hx   ? Stomach cancer Neg Hx   ? ? ?Social History  ? ?Socioeconomic History  ? Marital status: Widowed  ?  Spouse name: Not on file  ? Number of children: Not on file  ? Years of education: Not on file  ? Highest education level: Not on file  ?Occupational History  ? Occupation: Retired  ?Tobacco Use  ? Smoking status: Former  ?  Years: 15.00  ?  Types: Cigarettes  ?  Quit date: 05/15/1967  ?  Years since quitting: 54.3  ? Smokeless tobacco: Never  ?Substance and Sexual Activity  ? Alcohol use: No  ? Drug use: No  ? Sexual activity: Never  ?Other Topics Concern  ? Not on file  ?Social History Narrative  ? Regular exercise: yes  ? ?Social Determinants of Health  ? ?Financial Resource Strain: Low Risk   ? Difficulty of Paying Living Expenses: Not hard  at all  ?Food Insecurity: No Food Insecurity  ? Worried About Programme researcher, broadcasting/film/video in the Last Year: Never true  ? Ran Out of Food in the Last Year: Never true  ?Transportation Needs: No Transportation Needs  ? Lack of Transportation (Medical): No  ? Lack of Transportation (Non-Medical): No  ?Physical Activity: Inactive  ? Days of Exercise per Week: 0 days  ? Minutes of Exercise per Session: 0 min  ?Stress: No Stress Concern Present  ? Feeling of Stress : Not at all  ?Social Connections: Moderately Integrated  ? Frequency of Communication with Friends and Family: More than three times a week  ? Frequency of Social Gatherings with Friends and Family: More than three times a week  ? Attends Religious Services: More than 4 times per year  ? Active Member of Clubs or Organizations: Yes  ? Attends Banker Meetings: 1 to 4 times per year  ? Marital Status: Widowed  ?Intimate Partner Violence: Not At Risk  ? Fear of Current or Ex-Partner: No  ?  Emotionally Abused: No  ? Physically Abused: No  ? Sexually Abused: No  ? ? ?Outpatient Medications Prior to Visit  ?Medication Sig Dispense Refill  ? amLODipine (NORVASC) 10 MG tablet TAKE 1 TABLET (10 MG TOTAL) BY MOUTH DAILY. 90 tablet 1  ? Ascorbic Acid (VITAMIN C) 1000 MG tablet Take 1,000 mg by mouth daily.    ? aspirin (ASPIRIN 81) 81 MG EC tablet Take 1 tablet (81 mg total) by mouth daily. Swallow whole. 30 tablet 12  ? Calcium Carbonate-Vitamin D 600-400 MG-UNIT tablet Take 1 tablet by mouth 2 (two) times daily.    ? carboxymethylcellulose (REFRESH PLUS) 0.5 % SOLN Place 1 drop into both eyes 2 (two) times daily.    ? Coenzyme Q10 200 MG capsule Take 200 mg by mouth daily.    ? denosumab (PROLIA) 60 MG/ML SOSY injection Inject 60 mg into the skin every 6 (six) months.    ? ezetimibe (ZETIA) 10 MG tablet Take 1 tablet (10 mg total) by mouth daily. 90 tablet 1  ? hydrochlorothiazide (HYDRODIURIL) 25 MG tablet Take 1 tablet (25 mg total) by mouth daily. 90 tablet 1  ? ibuprofen (ADVIL,MOTRIN) 200 MG tablet Take 400 mg by mouth daily.    ? metoprolol succinate (TOPROL-XL) 50 MG 24 hr tablet Take 1 tablet (50 mg total) by mouth daily. Take with or immediately following a meal. 90 tablet 0  ? Multiple Vitamin (MULTIVITAMIN) tablet Take 1 tablet by mouth daily.    ? niacin 500 MG tablet Take 500 mg by mouth at bedtime.    ? Omega-3 Fatty Acids (FISH OIL) 1000 MG CAPS Take 1 capsule by mouth every morning.    ? omeprazole (PRILOSEC) 40 MG capsule Take 1 capsule (40 mg total) by mouth daily. 90 capsule 1  ? potassium chloride (KLOR-CON M) 10 MEQ tablet Take 1 tablet (10 mEq total) by mouth daily. 90 tablet 1  ? quinapril (ACCUPRIL) 40 MG tablet Take 1 tablet (40 mg total) by mouth at bedtime. 90 tablet 1  ? Cholecalciferol (VITAMIN D) 125 MCG (5000 UT) CAPS Take 1 capsule by mouth daily. (Patient not taking: Reported on 08/25/2021)    ? vitamin E 180 MG (400 UNITS) capsule Take 400 Units by mouth daily. (Patient not  taking: Reported on 08/25/2021)    ? ?No facility-administered medications prior to visit.  ? ? ?Allergies  ?Allergen Reactions  ? Atorvastatin Other (See  Comments)  ?  REACTION: MUSCLE PAINS  ? Diclofenac-Misoprostol Diarrhea and Nausea And Vomiting  ? Fenofibrate Other (See Comments)  ?  REACTION: carpopedal spasms  ? Rosuvastatin Other (See Comments)  ?  REACTION: MUSCLE PAINS  ? Statins Other (See Comments)  ?  myalgias  ? Amoxicillin-Pot Clavulanate Other (See Comments)  ?  REACTION: unspecified  ? Penicillins Hives  ? Sulfonamide Derivatives Hives  ? Tuberculin Ppd Other (See Comments)  ?  Redness at sight  ? ? ?Review of Systems  ?Respiratory:  Positive for cough (Accompanied with gagging).   ?Gastrointestinal:  Positive for nausea.  ?Musculoskeletal:  Positive for back pain (Radiates down right side).  ? ?   ?Objective:  ?  ?Physical Exam ?Constitutional:   ?   General: She is not in acute distress. ?   Appearance: Normal appearance. She is not ill-appearing.  ?HENT:  ?   Head: Normocephalic and atraumatic.  ?   Right Ear: External ear normal.  ?   Left Ear: External ear normal.  ?Eyes:  ?   Extraocular Movements: Extraocular movements intact.  ?   Pupils: Pupils are equal, round, and reactive to light.  ?Cardiovascular:  ?   Rate and Rhythm: Normal rate and regular rhythm.  ?   Heart sounds: Normal heart sounds. No murmur heard. ?  No gallop.  ?Pulmonary:  ?   Effort: Pulmonary effort is normal. No respiratory distress.  ?   Breath sounds: Normal breath sounds. No wheezing or rales.  ?Musculoskeletal:  ?   Cervical back: Normal.  ?   Thoracic back: Normal.  ?   Lumbar back: Tenderness present.  ?   Comments: 5/5 strength in lower extremities   ?Skin: ?   General: Skin is warm and dry.  ?Neurological:  ?   Mental Status: She is alert and oriented to person, place, and time.  ?   Deep Tendon Reflexes:  ?   Reflex Scores: ?     Patellar reflexes are 2+ on the right side and 2+ on the left side. ?Psychiatric:      ?   Mood and Affect: Mood normal.     ?   Behavior: Behavior normal.     ?   Judgment: Judgment normal.  ? ? ?BP 132/64 (BP Location: Right Arm, Patient Position: Sitting, Cuff Size: Large)   Pulse 69   Temp 9

## 2021-08-27 NOTE — Telephone Encounter (Signed)
Last Prolia inj 08/22/21 Next Prolia inj due 02/22/22 

## 2021-08-29 ENCOUNTER — Encounter (INDEPENDENT_AMBULATORY_CARE_PROVIDER_SITE_OTHER): Payer: Self-pay | Admitting: Family Medicine

## 2021-08-29 ENCOUNTER — Ambulatory Visit (INDEPENDENT_AMBULATORY_CARE_PROVIDER_SITE_OTHER): Payer: PPO | Admitting: Family Medicine

## 2021-08-29 VITALS — BP 148/80 | HR 80 | Temp 97.5°F | Ht 60.0 in | Wt 170.0 lb

## 2021-08-29 DIAGNOSIS — I10 Essential (primary) hypertension: Secondary | ICD-10-CM | POA: Diagnosis not present

## 2021-08-29 DIAGNOSIS — E782 Mixed hyperlipidemia: Secondary | ICD-10-CM

## 2021-08-29 DIAGNOSIS — R5383 Other fatigue: Secondary | ICD-10-CM

## 2021-08-29 DIAGNOSIS — E559 Vitamin D deficiency, unspecified: Secondary | ICD-10-CM | POA: Diagnosis not present

## 2021-08-29 DIAGNOSIS — R0602 Shortness of breath: Secondary | ICD-10-CM

## 2021-08-29 DIAGNOSIS — R739 Hyperglycemia, unspecified: Secondary | ICD-10-CM

## 2021-08-29 DIAGNOSIS — Z6833 Body mass index (BMI) 33.0-33.9, adult: Secondary | ICD-10-CM

## 2021-08-29 DIAGNOSIS — Z1331 Encounter for screening for depression: Secondary | ICD-10-CM

## 2021-08-29 DIAGNOSIS — E669 Obesity, unspecified: Secondary | ICD-10-CM | POA: Diagnosis not present

## 2021-08-30 LAB — T4: T4, Total: 10.6 ug/dL (ref 4.5–12.0)

## 2021-08-30 LAB — CBC WITH DIFFERENTIAL/PLATELET
Basophils Absolute: 0.1 10*3/uL (ref 0.0–0.2)
Basos: 0 %
EOS (ABSOLUTE): 0 10*3/uL (ref 0.0–0.4)
Eos: 0 %
Hematocrit: 39.8 % (ref 34.0–46.6)
Hemoglobin: 13.5 g/dL (ref 11.1–15.9)
Immature Grans (Abs): 0.3 10*3/uL — ABNORMAL HIGH (ref 0.0–0.1)
Immature Granulocytes: 2 %
Lymphocytes Absolute: 3.2 10*3/uL — ABNORMAL HIGH (ref 0.7–3.1)
Lymphs: 19 %
MCH: 29.7 pg (ref 26.6–33.0)
MCHC: 33.9 g/dL (ref 31.5–35.7)
MCV: 88 fL (ref 79–97)
Monocytes Absolute: 1.2 10*3/uL — ABNORMAL HIGH (ref 0.1–0.9)
Monocytes: 7 %
Neutrophils Absolute: 12.4 10*3/uL — ABNORMAL HIGH (ref 1.4–7.0)
Neutrophils: 72 %
Platelets: 530 10*3/uL — ABNORMAL HIGH (ref 150–450)
RBC: 4.55 x10E6/uL (ref 3.77–5.28)
RDW: 13.2 % (ref 11.7–15.4)
WBC: 17.1 10*3/uL — ABNORMAL HIGH (ref 3.4–10.8)

## 2021-08-30 LAB — COMPREHENSIVE METABOLIC PANEL
ALT: 23 IU/L (ref 0–32)
AST: 18 IU/L (ref 0–40)
Albumin/Globulin Ratio: 2.2 (ref 1.2–2.2)
Albumin: 5 g/dL — ABNORMAL HIGH (ref 3.7–4.7)
Alkaline Phosphatase: 64 IU/L (ref 44–121)
BUN/Creatinine Ratio: 22 (ref 12–28)
BUN: 14 mg/dL (ref 8–27)
Bilirubin Total: 0.3 mg/dL (ref 0.0–1.2)
CO2: 17 mmol/L — ABNORMAL LOW (ref 20–29)
Calcium: 9.4 mg/dL (ref 8.7–10.3)
Chloride: 95 mmol/L — ABNORMAL LOW (ref 96–106)
Creatinine, Ser: 0.63 mg/dL (ref 0.57–1.00)
Globulin, Total: 2.3 g/dL (ref 1.5–4.5)
Glucose: 109 mg/dL — ABNORMAL HIGH (ref 70–99)
Potassium: 4.6 mmol/L (ref 3.5–5.2)
Sodium: 130 mmol/L — ABNORMAL LOW (ref 134–144)
Total Protein: 7.3 g/dL (ref 6.0–8.5)
eGFR: 90 mL/min/{1.73_m2} (ref >59)

## 2021-08-30 LAB — FOLATE: Folate: >20 ng/mL (ref >3.0)

## 2021-08-30 LAB — HEMOGLOBIN A1C
Est. average glucose Bld gHb Est-mCnc: 120 mg/dL
Hgb A1c MFr Bld: 5.8 % — ABNORMAL HIGH (ref 4.8–5.6)

## 2021-08-30 LAB — INSULIN, RANDOM: INSULIN: 10 u[IU]/mL (ref 2.6–24.9)

## 2021-08-30 LAB — TSH: TSH: 3.53 u[IU]/mL (ref 0.450–4.500)

## 2021-08-30 LAB — LIPID PANEL WITH LDL/HDL RATIO
Cholesterol, Total: 273 mg/dL — ABNORMAL HIGH (ref 100–199)
HDL: 75 mg/dL (ref >39)
LDL Chol Calc (NIH): 168 mg/dL — ABNORMAL HIGH (ref 0–99)
LDL/HDL Ratio: 2.2 ratio (ref 0.0–3.2)
Triglycerides: 165 mg/dL — ABNORMAL HIGH (ref 0–149)
VLDL Cholesterol Cal: 30 mg/dL (ref 5–40)

## 2021-08-30 LAB — VITAMIN B12: Vitamin B-12: 761 pg/mL (ref 232–1245)

## 2021-08-30 LAB — VITAMIN D 25 HYDROXY (VIT D DEFICIENCY, FRACTURES): Vit D, 25-Hydroxy: 59.5 ng/mL (ref 30.0–100.0)

## 2021-09-04 ENCOUNTER — Telehealth (INDEPENDENT_AMBULATORY_CARE_PROVIDER_SITE_OTHER): Payer: Self-pay

## 2021-09-04 NOTE — Telephone Encounter (Signed)
Called pt and notified her that we will go over all her labs at her planned f/up ov in the very near future.  Until then, pt instructed to decrease her water/ liquid intake by 1/3 -1/2 of her current intake.   ?PT also instructed that it is also very important she make a f/up OV with her PCP as soon as there is an opening, to discuss these abnormalities as well. Lab results sent to [name] per Dr.Opalski. Pt verbalized understanding and stated that she has appointment with PCP tomorrow 09/05/21. ?

## 2021-09-04 NOTE — Telephone Encounter (Signed)
-----   Message from Mellody Dance, DO sent at 09/03/2021  7:45 PM EDT ----- ?Regarding: lab results... ?Hobie Kohles,  ? ?Please call pt and notify her that we will go over all her labs at her planned f/up ov in the very near future.  Until then, tell pt to decrease her water/ liquid intake by 1/3 -1/2 of her current intake.   ?However, please tell her it is also very important she make a f/up OV with her PCP as soon as there is an opening, to discuss these abnormalities as well.  ( I have cc'ed [name] about these.)   ? ?Mckell Riecke, please just add a note to this one stating you talked to pt and conveyed the above messages.  (No need to send back to me unless new issues arise) ? ?Thanks,  ?Dr Raliegh Scarlet ? ? ? ? ?----- Message ----- ?From: Interface, [facility] Lab Results In ?Sent: 08/30/2021   7:40 AM EDT ?To: Mellody Dance, DO ? ? ?

## 2021-09-05 ENCOUNTER — Ambulatory Visit (INDEPENDENT_AMBULATORY_CARE_PROVIDER_SITE_OTHER): Payer: PPO | Admitting: Family

## 2021-09-05 ENCOUNTER — Other Ambulatory Visit (HOSPITAL_BASED_OUTPATIENT_CLINIC_OR_DEPARTMENT_OTHER): Payer: Self-pay

## 2021-09-05 ENCOUNTER — Telehealth: Payer: Self-pay | Admitting: Family

## 2021-09-05 VITALS — BP 125/55 | HR 66 | Temp 98.4°F | Resp 16 | Wt 170.0 lb

## 2021-09-05 DIAGNOSIS — E8809 Other disorders of plasma-protein metabolism, not elsewhere classified: Secondary | ICD-10-CM

## 2021-09-05 DIAGNOSIS — D72829 Elevated white blood cell count, unspecified: Secondary | ICD-10-CM | POA: Insufficient documentation

## 2021-09-05 DIAGNOSIS — M5416 Radiculopathy, lumbar region: Secondary | ICD-10-CM | POA: Diagnosis not present

## 2021-09-05 DIAGNOSIS — E871 Hypo-osmolality and hyponatremia: Secondary | ICD-10-CM | POA: Diagnosis not present

## 2021-09-05 DIAGNOSIS — I10 Essential (primary) hypertension: Secondary | ICD-10-CM

## 2021-09-05 LAB — COMPREHENSIVE METABOLIC PANEL
ALT: 27 U/L (ref 0–35)
AST: 19 U/L (ref 0–37)
Albumin: 4.4 g/dL (ref 3.5–5.2)
Alkaline Phosphatase: 58 U/L (ref 39–117)
BUN: 13 mg/dL (ref 6–23)
CO2: 22 mEq/L (ref 19–32)
Calcium: 9.2 mg/dL (ref 8.4–10.5)
Chloride: 94 mEq/L — ABNORMAL LOW (ref 96–112)
Creatinine, Ser: 0.73 mg/dL (ref 0.40–1.20)
GFR: 77.68 mL/min (ref >60.00)
Glucose, Bld: 106 mg/dL — ABNORMAL HIGH (ref 70–99)
Potassium: 4.3 mEq/L (ref 3.5–5.1)
Sodium: 127 mEq/L — ABNORMAL LOW (ref 135–145)
Total Bilirubin: 0.4 mg/dL (ref 0.2–1.2)
Total Protein: 6.6 g/dL (ref 6.0–8.3)

## 2021-09-05 LAB — CBC WITH DIFFERENTIAL/PLATELET
Basophils Absolute: 0.1 10*3/uL (ref 0.0–0.1)
Basophils Relative: 0.8 % (ref 0.0–3.0)
Eosinophils Absolute: 0.5 10*3/uL (ref 0.0–0.7)
Eosinophils Relative: 3.7 % (ref 0.0–5.0)
HCT: 38.9 % (ref 36.0–46.0)
Hemoglobin: 13 g/dL (ref 12.0–15.0)
Lymphocytes Relative: 23.6 % (ref 12.0–46.0)
Lymphs Abs: 2.9 10*3/uL (ref 0.7–4.0)
MCHC: 33.4 g/dL (ref 30.0–36.0)
MCV: 89.6 fl (ref 78.0–100.0)
Monocytes Absolute: 0.7 10*3/uL (ref 0.1–1.0)
Monocytes Relative: 6 % (ref 3.0–12.0)
Neutro Abs: 8.1 10*3/uL — ABNORMAL HIGH (ref 1.4–7.7)
Neutrophils Relative %: 65.9 % (ref 43.0–77.0)
Platelets: 384 10*3/uL (ref 150.0–400.0)
RBC: 4.35 Mil/uL (ref 3.87–5.11)
RDW: 13.8 % (ref 11.5–15.5)
WBC: 12.3 10*3/uL — ABNORMAL HIGH (ref 4.0–10.5)

## 2021-09-05 MED ORDER — METOPROLOL SUCCINATE ER 50 MG PO TB24
75.0000 mg | ORAL_TABLET | Freq: Every day | ORAL | 0 refills | Status: DC
  Filled 2021-09-05 – 2021-10-16 (×2): qty 135, 90d supply, fill #0

## 2021-09-05 NOTE — Progress Notes (Signed)
? ?Subjective:  ? ?By signing my name below, I, [name], attest that this documentation has been prepared under the direction and in the presence of [name], 09/05/2021  ? ? Patient ID: [name], female    DOB: Oct 09, 1940, 81 y.o.   MRN: [id number] ? ?Chief Complaint  ?Patient presents with  ? Hypertension  ?  Here for follow up  ? Hip Pain  ?  Complains of hip pain "flare up"  ? ? ?HPI ?Patient is in today for an office visit. ? ?Lab Results - She went to an appointment on 08/29/2021 at her weight loss dr's office and had routine lab work draw.  Her sodium levels were low. She was originally on a low - sodium diet. She has been decreasing her fluid intake.  Her white blood cell count is high. She was taking steroid medications at the time of lab test.  ? ?Blood Pressure - Her blood pressure is improving. She is taking her medications as prescribed.  ?BP Readings from Last 3 Encounters:  ?09/05/21 (!) 125/55  ?08/29/21 (!) 148/80  ?08/25/21 132/64  ? ? ? ?There are no preventive care reminders to display for this patient. ? ?Past Medical History:  ?Diagnosis Date  ? Arthritis   ? osteoarthritis  ? Back pain   ? Barrett's esophagus   ? Cataract   ? bilateral cateracts removed  ? Chronic kidney disease   ? kidney stones  ? Chronic obstructive bronchitis (HCC)   ? Dr Delford Field  ? Colitis   ? Colon polyps   ? GERD (gastroesophageal reflux disease)   ? History of chicken pox   ? History of nephrolithiasis   ? History of transfusion of packed red blood cells   ? Hypercholesteremia   ? Hyperlipidemia   ? Hypertension   ? IFG (impaired fasting glucose)   ? Osteoarthritis   ? Osteoporosis   ? pt cannot tolerate fosamax  ? Positive PPD   ? exposure to 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 Positive PPD only, negative CXR.  ? Swelling of both lower extremities   ? Urinary incontinence   ? ? ?Past Surgical History:  ?Procedure Laterality Date  ? ABDOMINAL HYSTERECTOMY  1970  ? APPENDECTOMY    ? CHOLECYSTECTOMY    ? LEG  SURGERY  2008  ? right  ? TOTAL KNEE ARTHROPLASTY  04/2006  ? total left knee replacement  ? ? ?Family History  ?Problem Relation Age of Onset  ? Coronary artery disease Mother   ? Arthritis Mother   ? High blood pressure Mother   ? Heart disease Mother   ? High blood pressure Father   ? Heart disease Father   ? Asthma Maternal Grandmother   ? Tuberculosis Other   ? Colon cancer Neg Hx   ? Esophageal cancer Neg Hx   ? Rectal cancer Neg Hx   ? Stomach cancer Neg Hx   ? ? ?Social History  ? ?Socioeconomic History  ? Marital status: Widowed  ?  Spouse name: Not on file  ? Number of children: Not on file  ? Years of education: Not on file  ? Highest education level: Not on file  ?Occupational History  ? Occupation: Retired  ?Tobacco Use  ? Smoking status: Former  ?  Years: 15.00  ?  Types: Cigarettes  ?  Quit date: 05/15/1967  ?  Years since quitting: 54.3  ? Smokeless tobacco: Never  ?Substance and Sexual Activity  ? Alcohol use: No  ?  Drug use: No  ? Sexual activity: Never  ?Other Topics Concern  ? Not on file  ?Social History Narrative  ? Regular exercise: yes  ? ?Social Determinants of Health  ? ?Financial Resource Strain: Low Risk   ? Difficulty of Paying Living Expenses: Not hard at all  ?Food Insecurity: Not on file  ?Transportation Needs: Not on file  ?Physical Activity: Inactive  ? Days of Exercise per Week: 0 days  ? Minutes of Exercise per Session: 0 min  ?Stress: Not on file  ?Social Connections: Not on file  ?Intimate Partner Violence: Not on file  ? ? ?Outpatient Medications Prior to Visit  ?Medication Sig Dispense Refill  ? amLODipine (NORVASC) 10 MG tablet TAKE 1 TABLET (10 MG TOTAL) BY MOUTH DAILY. 90 tablet 1  ? Ascorbic Acid (VITAMIN C) 1000 MG tablet Take 1,000 mg by mouth daily.    ? aspirin (ASPIRIN 81) 81 MG EC tablet Take 1 tablet (81 mg total) by mouth daily. Swallow whole. 30 tablet 12  ? Calcium Carbonate-Vitamin D 600-400 MG-UNIT tablet Take 1 tablet by mouth 2 (two) times daily.    ?  carboxymethylcellulose (REFRESH PLUS) 0.5 % SOLN Place 1 drop into both eyes 2 (two) times daily.    ? Cholecalciferol (VITAMIN D) 125 MCG (5000 UT) CAPS Take 1 capsule by mouth daily.    ? Coenzyme Q10 200 MG capsule Take 200 mg by mouth daily.    ? denosumab (PROLIA) 60 MG/ML SOSY injection Inject 60 mg into the skin every 6 (six) months.    ? ezetimibe (ZETIA) 10 MG tablet Take 1 tablet (10 mg total) by mouth daily. 90 tablet 1  ? hydrochlorothiazide (HYDRODIURIL) 25 MG tablet Take 1 tablet (25 mg total) by mouth daily. 90 tablet 1  ? ibuprofen (ADVIL,MOTRIN) 200 MG tablet Take 400 mg by mouth daily.    ? methylPREDNISolone (MEDROL DOSEPAK) 4 MG TBPK tablet Take by mouth per package instructions on back of pack 21 tablet 0  ? metoprolol succinate (TOPROL-XL) 50 MG 24 hr tablet Take 1 tablet (50 mg total) by mouth daily. Take with or immediately following a meal. 90 tablet 0  ? Multiple Vitamin (MULTIVITAMIN) tablet Take 1 tablet by mouth daily.    ? niacin 500 MG tablet Take 500 mg by mouth at bedtime.    ? Omega-3 Fatty Acids (FISH OIL) 1000 MG CAPS Take 1 capsule by mouth every morning.    ? omeprazole (PRILOSEC) 40 MG capsule Take 1 capsule (40 mg total) by mouth daily. 90 capsule 1  ? potassium chloride (KLOR-CON M) 10 MEQ tablet Take 1 tablet (10 mEq total) by mouth daily. 90 tablet 1  ? quinapril (ACCUPRIL) 40 MG tablet Take 1 tablet (40 mg total) by mouth at bedtime. 90 tablet 1  ? vitamin E 180 MG (400 UNITS) capsule Take 400 Units by mouth daily.    ? fluconazole (DIFLUCAN) 150 MG tablet Take 1 tablet by mouth as needed for oral thrush 1 tablet 0  ? ?No facility-administered medications prior to visit.  ? ? ?Allergies  ?Allergen Reactions  ? Atorvastatin Other (See Comments)  ?  REACTION: MUSCLE PAINS  ? Diclofenac-Misoprostol Diarrhea and Nausea And Vomiting  ? Fenofibrate Other (See Comments)  ?  REACTION: carpopedal spasms  ? Rosuvastatin Other (See Comments)  ?  REACTION: MUSCLE PAINS  ? Statins  Other (See Comments)  ?  myalgias  ? Amoxicillin-Pot Clavulanate Other (See Comments)  ?  REACTION: unspecified  ?  Penicillins Hives  ? Sulfonamide Derivatives Hives  ? Tuberculin Ppd Other (See Comments)  ?  Redness at sight  ? ? ?ROS ? ?   ?Objective:  ?  ?Physical Exam ?Constitutional:   ?   General: She is not in acute distress. ?   Appearance: Normal appearance. She is not ill-appearing.  ?HENT:  ?   Head: Normocephalic and atraumatic.  ?   Right Ear: External ear normal.  ?   Left Ear: External ear normal.  ?Eyes:  ?   Extraocular Movements: Extraocular movements intact.  ?   Pupils: Pupils are equal, round, and reactive to light.  ?Cardiovascular:  ?   Rate and Rhythm: Normal rate and regular rhythm.  ?   Heart sounds: Normal heart sounds. No murmur heard. ?  No gallop.  ?Pulmonary:  ?   Effort: Pulmonary effort is normal. No respiratory distress.  ?   Breath sounds: Normal breath sounds. No wheezing or rales.  ?Skin: ?   General: Skin is warm and dry.  ?Neurological:  ?   Mental Status: She is alert and oriented to person, place, and time.  ?Psychiatric:     ?   Mood and Affect: Mood normal.     ?   Behavior: Behavior normal.     ?   Judgment: Judgment normal.  ? ? ?BP (!) 125/55 (BP Location: Right Arm, Patient Position: Sitting, Cuff Size: Small)   Pulse 66   Temp 98.4 ?F (36.9 ?C) (Oral)   Resp 16   Wt 170 lb (77.1 kg)   LMP 05/14/1968   SpO2 99%   BMI 33.20 kg/m?  ?Wt Readings from Last 3 Encounters:  ?09/05/21 170 lb (77.1 kg)  ?08/29/21 170 lb (77.1 kg)  ?08/25/21 179 lb (81.2 kg)  ? ? ?   ?Assessment & Plan:  ? ?Problem List Items Addressed This Visit   ? ?  ? Unprioritized  ? Lumbar radiculopathy, acute  ?  Notes some improvement following steroid taper. She plans to start some walking.  ? ?  ?  ? Leukocytosis - Primary  ?  Lab Results  ?Component Value Date  ? WBC 17.1 (H) 08/29/2021  ? HGB 13.5 08/29/2021  ? HCT 39.8 08/29/2021  ? MCV 88 08/29/2021  ? PLT 530 (H) 08/29/2021  ?Suspect that  elevated WBC was due to steroids. She has now been off steroids for >1 week. Repeat CBC today.  ?  ?  ? Relevant Orders  ? CBC with Differential/Platelet  ? Hyponatremia  ?  I suspect that hctz is a contributing factor. Re

## 2021-09-05 NOTE — Assessment & Plan Note (Signed)
>>  ASSESSMENT AND PLAN FOR ESSENTIAL HYPERTENSION WRITTEN ON 09/05/2021  9:19 AM BY O'SULLIVAN, Luis Sami, NP  BP looks good. Continue hctz, quinapril and toprol.

## 2021-09-05 NOTE — Assessment & Plan Note (Signed)
New finding on recent lab work. Check SPEP.  ?

## 2021-09-05 NOTE — Assessment & Plan Note (Signed)
BP looks good. Continue hctz, quinapril and toprol.  ?

## 2021-09-05 NOTE — Assessment & Plan Note (Signed)
I suspect that hctz is a contributing factor. Repeat CMET today.  ?

## 2021-09-05 NOTE — Assessment & Plan Note (Signed)
Notes some improvement following steroid taper. She plans to start some walking.  ?

## 2021-09-05 NOTE — Assessment & Plan Note (Signed)
Lab Results  ?Component Value Date  ? WBC 17.1 (H) 08/29/2021  ? HGB 13.5 08/29/2021  ? HCT 39.8 08/29/2021  ? MCV 88 08/29/2021  ? PLT 530 (H) 08/29/2021  ? ?Suspect that elevated WBC was due to steroids. She has now been off steroids for >1 week. Repeat CBC today.  ?

## 2021-09-05 NOTE — Telephone Encounter (Signed)
Patient advised of results, provider's advised and change in medications. She verbalized understanding. She was schedule to come back for follow up in 2 weeks. ?

## 2021-09-05 NOTE — Patient Instructions (Signed)
Please complete lab work prior to leaving.   

## 2021-09-05 NOTE — Telephone Encounter (Signed)
Please advise pt that her sodium has dropped a bit more. I would like her to stop hctz as I think this is contributing.  ? ?In the meantime, increase toprol xl from 50mg  to 75mg  (1.5 tabs) once daily.  ? ?White blood cell count is almost back to normal.  ? ?Follow up in 1-2 weeks.  ?

## 2021-09-07 ENCOUNTER — Ambulatory Visit: Payer: PPO

## 2021-09-08 ENCOUNTER — Telehealth: Payer: Self-pay | Admitting: Family

## 2021-09-08 DIAGNOSIS — D72829 Elevated white blood cell count, unspecified: Secondary | ICD-10-CM

## 2021-09-08 LAB — PROTEIN ELECTROPHORESIS, SERUM
Albumin ELP: 4.2 g/dL (ref 3.8–4.8)
Alpha 1: 0.3 g/dL (ref 0.2–0.3)
Alpha 2: 0.9 g/dL (ref 0.5–0.9)
Beta 2: 0.3 g/dL (ref 0.2–0.5)
Beta Globulin: 0.5 g/dL (ref 0.4–0.6)
Gamma Globulin: 0.6 g/dL — ABNORMAL LOW (ref 0.8–1.7)
Total Protein: 6.8 g/dL (ref 6.1–8.1)

## 2021-09-08 NOTE — Telephone Encounter (Signed)
Patient advised she needs 1 month follow up for labs, she has appt on 5-09 for follow up and asked if she can move appt two weeks up to make it one month. Appointment was moved to 10-03-21 ?

## 2021-09-08 NOTE — Telephone Encounter (Signed)
I would like to repeat pt's labs in 1 month. I think that the changes that we are seeing are left over from her recent steroid medication.  ?

## 2021-09-11 ENCOUNTER — Ambulatory Visit (HOSPITAL_BASED_OUTPATIENT_CLINIC_OR_DEPARTMENT_OTHER)
Admission: RE | Admit: 2021-09-11 | Discharge: 2021-09-11 | Disposition: A | Discharge status: Home / Self Care | Payer: PPO | Source: Ambulatory Visit | Attending: Family | Admitting: Family

## 2021-09-11 ENCOUNTER — Encounter (HOSPITAL_BASED_OUTPATIENT_CLINIC_OR_DEPARTMENT_OTHER): Payer: Self-pay

## 2021-09-11 DIAGNOSIS — Z1231 Encounter for screening mammogram for malignant neoplasm of breast: Secondary | ICD-10-CM | POA: Diagnosis not present

## 2021-09-14 ENCOUNTER — Ambulatory Visit (INDEPENDENT_AMBULATORY_CARE_PROVIDER_SITE_OTHER): Payer: PPO

## 2021-09-14 VITALS — BP 137/74 | HR 61 | Temp 98.3°F | Resp 16 | Ht 60.0 in | Wt 170.0 lb

## 2021-09-14 DIAGNOSIS — Z0000 Encounter for general adult medical examination without abnormal findings: Secondary | ICD-10-CM

## 2021-09-14 NOTE — Progress Notes (Signed)
? ?Subjective:  ? Miranda DickSylvia W [name] is a 81 y.o. female who presents for Medicare Annual (Subsequent) preventive examination. ? ?Review of Systems    ? ?Cardiac Risk Factors include: advanced age (>6755men, 74>65 women);hypertension;dyslipidemia ? ?   ?Objective:  ?  ?Today's Vitals  ? 09/14/21 1021  ?BP: 137/74  ?Pulse: 61  ?Resp: 16  ?Temp: 98.3 ?F (36.8 ?C)  ?SpO2: 98%  ?Weight: 170 lb (77.1 kg)  ?Height: 5' (1.524 m)  ? ?Body mass index is 33.2 kg/m?. ? ? ?  09/14/2021  ? 10:27 AM 09/01/2020  ?  8:15 AM 08/27/2019  ?  2:24 PM 09/24/2016  ? 10:20 AM 12/14/2014  ?  9:36 AM 02/10/2014  ?  9:31 AM  ?Advanced Directives  ?Does Patient Have a Medical Advance Directive? Yes Yes No Yes Yes No  ?Type of Estate agentAdvance Directive Healthcare Power of WigginsAttorney;Out of facility DNR (pink MOST or yellow form);Living will Healthcare Power of [place]landsAttorney;Living will  Healthcare Power of [place]orney;Living will Healthcare Power of Attorney   ?Does patient want to make changes to medical advance directive?    No - Patient declined No - Patient declined   ?Copy of Healthcare Power of Attorney in Chart? No - copy requested No - copy requested  No - copy requested No - copy requested   ?Would patient like information on creating a medical advance directive?   No - Patient declined   No - patient declined information  ? ? ?Current Medications (verified) ?Outpatient Encounter Medications as of 09/14/2021  ?Medication Sig  ? amLODipine (NORVASC) 10 MG tablet TAKE 1 TABLET (10 MG TOTAL) BY MOUTH DAILY.  ? Ascorbic Acid (VITAMIN C) 1000 MG tablet Take 1,000 mg by mouth daily.  ? aspirin (ASPIRIN 81) 81 MG EC tablet Take 1 tablet (81 mg total) by mouth daily. Swallow whole.  ? carboxymethylcellulose (REFRESH PLUS) 0.5 % SOLN Place 1 drop into both eyes 2 (two) times daily.  ? Cholecalciferol (VITAMIN D) 125 MCG (5000 UT) CAPS Take 1 capsule by mouth daily.  ? Coenzyme Q10 200 MG capsule Take 200 mg by mouth daily.  ? denosumab (PROLIA) 60 MG/ML SOSY injection Inject  60 mg into the skin every 6 (six) months.  ? ezetimibe (ZETIA) 10 MG tablet Take 1 tablet (10 mg total) by mouth daily.  ? ibuprofen (ADVIL,MOTRIN) 200 MG tablet Take 400 mg by mouth daily.  ? metoprolol succinate (TOPROL-XL) 50 MG 24 hr tablet Take 1&1/2 tablets (75 mg total) by mouth daily. Take with or immediately following a meal.  ? Multiple Vitamin (MULTIVITAMIN) tablet Take 1 tablet by mouth daily.  ? niacin 500 MG tablet Take 500 mg by mouth at bedtime.  ? Omega-3 Fatty Acids (FISH OIL) 1000 MG CAPS Take 1 capsule by mouth every morning.  ? omeprazole (PRILOSEC) 40 MG capsule Take 1 capsule (40 mg total) by mouth daily.  ? potassium chloride (KLOR-CON M) 10 MEQ tablet Take 1 tablet (10 mEq total) by mouth daily.  ? quinapril (ACCUPRIL) 40 MG tablet Take 1 tablet (40 mg total) by mouth at bedtime.  ? [DISCONTINUED] Calcium Carbonate-Vitamin D 600-400 MG-UNIT tablet Take 1 tablet by mouth 2 (two) times daily.  ? [DISCONTINUED] vitamin E 180 MG (400 UNITS) capsule Take 400 Units by mouth daily.  ? ?No facility-administered encounter medications on file as of 09/14/2021.  ? ? ?Allergies (verified) ?Atorvastatin, Diclofenac-misoprostol, Fenofibrate, Rosuvastatin, Statins, Amoxicillin-pot clavulanate, Penicillins, Sulfonamide derivatives, and Tuberculin ppd  ? ?History: ?Past Medical History:  ?  Diagnosis Date  ? Arthritis   ? osteoarthritis  ? Back pain   ? Barrett's esophagus   ? Cataract   ? bilateral cateracts removed  ? Chronic kidney disease   ? kidney stones  ? Chronic obstructive bronchitis (HCC)   ? Dr Delford Field  ? Colitis   ? Colon polyps   ? GERD (gastroesophageal reflux disease)   ? History of chicken pox   ? History of nephrolithiasis   ? History of transfusion of packed red blood cells   ? Hypercholesteremia   ? Hyperlipidemia   ? Hypertension   ? IFG (impaired fasting glucose)   ? Osteoarthritis   ? Osteoporosis   ? pt cannot tolerate fosamax  ? Positive PPD   ? exposure to 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 Positive  PPD only, negative CXR.  ? Swelling of both lower extremities   ? Urinary incontinence   ? ?Past Surgical History:  ?Procedure Laterality Date  ? ABDOMINAL HYSTERECTOMY  1970  ? APPENDECTOMY    ? CHOLECYSTECTOMY    ? LEG SURGERY  2008  ? right  ? TOTAL KNEE ARTHROPLASTY  04/2006  ? total left knee replacement  ? ?Family History  ?Problem Relation Age of Onset  ? Coronary artery disease Mother   ? Arthritis Mother   ? High blood pressure Mother   ? Heart disease Mother   ? High blood pressure Father   ? Heart disease Father   ? Asthma Maternal Grandmother   ? Tuberculosis Other   ? Colon cancer Neg Hx   ? Esophageal cancer Neg Hx   ? Rectal cancer Neg Hx   ? Stomach cancer Neg Hx   ? ?Social History  ? ?Socioeconomic History  ? Marital status: Widowed  ?  Spouse name: Not on file  ? Number of children: Not on file  ? Years of education: Not on file  ? Highest education level: Not on file  ?Occupational History  ? Occupation: Retired  ?Tobacco Use  ? Smoking status: Former  ?  Years: 15.00  ?  Types: Cigarettes  ?  Quit date: 05/15/1967  ?  Years since quitting: 54.3  ? Smokeless tobacco: Never  ?Substance and Sexual Activity  ? Alcohol use: No  ? Drug use: No  ? Sexual activity: Never  ?Other Topics Concern  ? Not on file  ?Social History Narrative  ? Regular exercise: yes  ? ?Social Determinants of Health  ? ?Financial Resource Strain: Low Risk   ? Difficulty of Paying Living Expenses: Not hard at all  ?Food Insecurity: No Food Insecurity  ? Worried About Programme researcher, broadcasting/film/video in the Last Year: Never true  ? Ran Out of Food in the Last Year: Never true  ?Transportation Needs: No Transportation Needs  ? Lack of Transportation (Medical): No  ? Lack of Transportation (Non-Medical): No  ?Physical Activity: Inactive  ? Days of Exercise per Week: 0 days  ? Minutes of Exercise per Session: 0 min  ?Stress: No Stress Concern Present  ? Feeling of Stress : Not at all  ?Social Connections: Moderately Integrated  ? Frequency of  Communication with Friends and Family: More than three times a week  ? Frequency of Social Gatherings with Friends and Family: More than three times a week  ? Attends Religious Services: More than 4 times per year  ? Active Member of Clubs or Organizations: Yes  ? Attends Banker Meetings: More than 4 times per year  ? Marital  Status: Widowed  ? ? ?Tobacco Counseling ?Counseling given: Not Answered ? ? ?Clinical Intake: ? ?Pre-visit preparation completed: Yes ? ?Pain : No/denies pain ? ?  ? ?BMI - recorded: 33.2 ?Nutritional Status: BMI > 30  Obese ?Nutritional Risks: None ?Diabetes: No ? ?How often do you need to have someone help you when you read instructions, pamphlets, or other written materials from your doctor or pharmacy?: 1 - Never ? ?Diabetic?No ? ?Interpreter Needed?: No ? ?Information entered by :: Zarian Colpitts ? ? ?Activities of Daily Living ? ?  09/14/2021  ? 10:37 AM  ?In your present state of health, do you have any difficulty performing the following activities:  ?Hearing? 0  ?Vision? 0  ?Difficulty concentrating or making decisions? 0  ?Walking or climbing stairs? 0  ?Dressing or bathing? 0  ?Doing errands, shopping? 0  ?Preparing Food and eating ? N  ?Using the Toilet? N  ?In the past six months, have you accidently leaked urine? Y  ?Do you have problems with loss of bowel control? N  ?Managing your Medications? N  ?Managing your Finances? N  ?Housekeeping or managing your Housekeeping? N  ? ? ?Patient Care Team: ?Sandford Craze, NP as PCP - General ?Hart Carwin, MD (Inactive) as Consulting Physician (Gastroenterology) ?Sheran Luz, MD as Consulting Physician (Physical Medicine and Rehabilitation) ?Henrene Pastor, RPH-CPP (Pharmacist) ? ?Indicate any recent Medical Services you may have received from other than [facility] providers in the past year (date may be approximate). ? ?   ?Assessment:  ? This is a routine wellness examination for Jinan. ? ?Hearing/Vision screen ?No  results found. ? ?Dietary issues and exercise activities discussed: ?Current Exercise Habits: The patient does not participate in regular exercise at present, Exercise limited by: None identified ? ? Goals Add

## 2021-09-14 NOTE — Patient Instructions (Signed)
[name] , ?Thank you for taking time to come for your Medicare Wellness Visit. I appreciate your ongoing commitment to your health goals. Please review the following plan we discussed and let me know if I can assist you in the future.  ? ?Screening recommendations/referrals: ?Colonoscopy: no longer needed  ?Mammogram: 09/11/21 due 09/12/22 ?Bone Density: 08/08/20 due 08/09/22 ?Recommended yearly ophthalmology/optometry visit for glaucoma screening and checkup ?Recommended yearly dental visit for hygiene and checkup ? ?Vaccinations: ?Influenza vaccine: up to date ?Pneumococcal vaccine: up to date ?Tdap vaccine: up to date ?Shingles vaccine: up to date   ?Covid-19:completed ? ?Advanced directives: yes, not on file ? ?Conditions/risks identified: see problem list  ? ?Next appointment: Follow up in one year for your annual wellness visit  ? ? ?Preventive Care 81 Years and Older, Female ?Preventive care refers to lifestyle choices and visits with your health care provider that can promote health and wellness. ?What does preventive care include? ?A yearly physical exam. This is also called an annual well check. ?Dental exams once or twice a year. ?Routine eye exams. Ask your health care provider how often you should have your eyes checked. ?Personal lifestyle choices, including: ?Daily care of your teeth and gums. ?Regular physical activity. ?Eating a healthy diet. ?Avoiding tobacco and drug use. ?Limiting alcohol use. ?Practicing safe sex. ?Taking low-dose aspirin every day. ?Taking vitamin and mineral supplements as recommended by your health care provider. ?What happens during an annual well check? ?The services and screenings done by your health care provider during your annual well check will depend on your age, overall health, lifestyle risk factors, and family history of disease. ?Counseling  ?Your health care provider may ask you questions about your: ?Alcohol use. ?Tobacco use. ?Drug use. ?Emotional  well-being. ?Home and relationship well-being. ?Sexual activity. ?Eating habits. ?History of falls. ?Memory and ability to understand (cognition). ?Work and work Astronomer. ?Reproductive health. ?Screening  ?You may have the following tests or measurements: ?Height, weight, and BMI. ?Blood pressure. ?Lipid and cholesterol levels. These may be checked every 5 years, or more frequently if you are over 48 years old. ?Skin check. ?Lung cancer screening. You may have this screening every year starting at age 41 if you have a 30-pack-year history of smoking and currently smoke or have quit within the past 15 years. ?Fecal occult blood test (FOBT) of the stool. You may have this test every year starting at age 23. ?Flexible sigmoidoscopy or colonoscopy. You may have a sigmoidoscopy every 5 years or a colonoscopy every 10 years starting at age 50. ?Hepatitis C blood test. ?Hepatitis B blood test. ?Sexually transmitted disease (STD) testing. ?Diabetes screening. This is done by checking your blood sugar (glucose) after you have not eaten for a while (fasting). You may have this done every 1-3 years. ?Bone density scan. This is done to screen for osteoporosis. You may have this done starting at age 42. ?Mammogram. This may be done every 1-2 years. Talk to your health care provider about how often you should have regular mammograms. ?Talk with your health care provider about your test results, treatment options, and if necessary, the need for more tests. ?Vaccines  ?Your health care provider may recommend certain vaccines, such as: ?Influenza vaccine. This is recommended every year. ?Tetanus, diphtheria, and acellular pertussis (Tdap, Td) vaccine. You may need a Td booster every 10 years. ?Zoster vaccine. You may need this after age 69. ?Pneumococcal 13-valent conjugate (PCV13) vaccine. One dose is recommended after age 8. ?Pneumococcal polysaccharide (  PPSV23) vaccine. One dose is recommended after age 25. ?Talk to your  health care provider about which screenings and vaccines you need and how often you need them. ?This information is not intended to replace advice given to you by your health care provider. Make sure you discuss any questions you have with your health care provider. ?Document Released: 05/27/2015 Document Revised: 01/18/2016 Document Reviewed: 03/01/2015 ?Elsevier Interactive Patient Education ? 2017 [place]. ? ?Fall Prevention in the Home ?Falls can cause injuries. They can happen to people of all ages. There are many things you can do to make your home safe and to help prevent falls. ?What can I do on the outside of my home? ?Regularly fix the edges of walkways and driveways and fix any cracks. ?Remove anything that might make you trip as you walk through a door, such as a raised step or threshold. ?Trim any bushes or trees on the path to your home. ?Use bright outdoor lighting. ?Clear any walking paths of anything that might make someone trip, such as rocks or tools. ?Regularly check to see if handrails are loose or broken. Make sure that both sides of any steps have handrails. ?Any raised decks and porches should have guardrails on the edges. ?Have any leaves, snow, or ice cleared regularly. ?Use sand or salt on walking paths during winter. ?Clean up any spills in your garage right away. This includes oil or grease spills. ?What can I do in the bathroom? ?Use night lights. ?Install grab bars by the toilet and in the tub and shower. Do not use towel bars as grab bars. ?Use non-skid mats or decals in the tub or shower. ?If you need to sit down in the shower, use a plastic, non-slip stool. ?Keep the floor dry. Clean up any water that spills on the floor as soon as it happens. ?Remove soap buildup in the tub or shower regularly. ?Attach bath mats securely with double-sided non-slip rug tape. ?Do not have throw rugs and other things on the floor that can make you trip. ?What can I do in the bedroom? ?Use night  lights. ?Make sure that you have a light by your bed that is easy to reach. ?Do not use any sheets or blankets that are too big for your bed. They should not hang down onto the floor. ?Have a firm chair that has side arms. You can use this for support while you get dressed. ?Do not have throw rugs and other things on the floor that can make you trip. ?What can I do in the kitchen? ?Clean up any spills right away. ?Avoid walking on wet floors. ?Keep items that you use a lot in easy-to-reach places. ?If you need to reach something above you, use a strong step stool that has a grab bar. ?Keep electrical cords out of the way. ?Do not use floor polish or wax that makes floors slippery. If you must use wax, use non-skid floor wax. ?Do not have throw rugs and other things on the floor that can make you trip. ?What can I do with my stairs? ?Do not leave any items on the stairs. ?Make sure that there are handrails on both sides of the stairs and use them. Fix handrails that are broken or loose. Make sure that handrails are as long as the stairways. ?Check any carpeting to make sure that it is firmly attached to the stairs. Fix any carpet that is loose or worn. ?Avoid having throw rugs at the top or  bottom of the stairs. If you do have throw rugs, attach them to the floor with carpet tape. ?Make sure that you have a light switch at the top of the stairs and the bottom of the stairs. If you do not have them, ask someone to add them for you. ?What else can I do to help prevent falls? ?Wear shoes that: ?Do not have high heels. ?Have rubber bottoms. ?Are comfortable and fit you well. ?Are closed at the toe. Do not wear sandals. ?If you use a stepladder: ?Make sure that it is fully opened. Do not climb a closed stepladder. ?Make sure that both sides of the stepladder are locked into place. ?Ask someone to hold it for you, if possible. ?Clearly mark and make sure that you can see: ?Any grab bars or handrails. ?First and last  steps. ?Where the edge of each step is. ?Use tools that help you move around (mobility aids) if they are needed. These include: ?Canes. ?Walkers. ?Scooters. ?Crutches. ?Turn on the lights when you go into a dark area. Replace

## 2021-09-19 ENCOUNTER — Ambulatory Visit: Payer: PPO | Admitting: Family

## 2021-09-19 ENCOUNTER — Other Ambulatory Visit (HOSPITAL_BASED_OUTPATIENT_CLINIC_OR_DEPARTMENT_OTHER): Payer: Self-pay

## 2021-09-19 ENCOUNTER — Telehealth: Payer: Self-pay | Admitting: Family

## 2021-09-19 ENCOUNTER — Other Ambulatory Visit: Payer: Self-pay | Admitting: Family

## 2021-09-19 MED ORDER — LISINOPRIL 20 MG PO TABS
40.0000 mg | ORAL_TABLET | Freq: Every day | ORAL | 2 refills | Status: DC
  Filled 2021-09-19: qty 60, 30d supply, fill #0
  Filled 2021-10-31: qty 60, 30d supply, fill #1

## 2021-09-19 NOTE — Telephone Encounter (Signed)
Please advise pt that I have sent Linsinopril 40mg  once daily to replace the quinapril. I would like her to return for a nurse visit with BMET in 1 week please. Dx htn.  ?

## 2021-09-19 NOTE — Telephone Encounter (Addendum)
Quinapril is on long term back order- no release date.  ?

## 2021-09-19 NOTE — Telephone Encounter (Signed)
Pt states pharmacy is out of rx and she is needing a substitute. Please advise.  ? ?quinapril (ACCUPRIL) 40 MG tablet ? ?[facility]  ? [address], [place] [address]  ?Phone:  [phone]  Fax:  [phone]  ?

## 2021-09-19 NOTE — Progress Notes (Signed)
? ? ? ? ?Chief Complaint:  ? ?OBESITY ?[name] (MR# [id number]) is a 81 y.o. female who presents for evaluation and treatment of obesity and related comorbidities. Current Body mass index is 33.2 kg/m?[name] has been struggling with her weight for many years and has been unsuccessful in either losing weight, maintaining weight loss, or reaching her healthy weight goal. ? ?Referred by a friend who comes here. Retired and widowed. Craves potatoes, pancakes, and biscuits and gravy. Worst habits are bread and sweets. Loves coffee with a lot of flavored creamer. Patient used No[employer] in the past and really liked it. Back pain has slowed her down the past couple months but prior. On the treadmill 40 minutes everyday and very active with resistance training as well.  ? ?[name] is currently in the action stage of change and ready to dedicate time achieving and maintaining a healthier weight. Kimberlye is interested in becoming our patient and working on intensive lifestyle modifications including (but not limited to) diet and exercise for weight loss. ? ?[name]'s habits were reviewed today and are as follows: her desired weight loss is 30 lbs, she started gaining weight at 66, her heaviest weight ever was 190 pounds, she has significant food cravings issues, she skips meals frequently, she is frequently drinking liquids with calories, she frequently makes poor food choices, and she struggles with emotional eating. ? ?Depression Screen ?[name]'s Food and Mood (modified PHQ-9) score was 0. ? ? ?  09/14/2021  ? 10:33 AM  ?Depression screen PHQ 2/9  ?Decreased Interest 0  ?Down, Depressed, Hopeless 0  ?PHQ - 2 Score 0  ?Altered sleeping 0  ?Tired, decreased energy 0  ?Change in appetite 0  ?Feeling bad or failure about yourself  0  ?Trouble concentrating 0  ?Moving slowly or fidgety/restless 0  ?Suicidal thoughts 0  ?PHQ-9 Score 0  ?Difficult doing work/chores Not difficult at all  ? ?Subjective:  ? ?1. Other fatigue ?[name]  admits to daytime somnolence and admits to waking up still tired. Patient has a history of symptoms of daytime fatigue and morning fatigue. [name] generally gets 5 hours of sleep per night, and states that she has generally restful sleep. Snoring is present. Apneic episodes are not present. Epworth Sleepiness Score is 8. Denies emotional eating or depression or anxiety, but gets up every 2 hours and uses the bathroom.  ? ?2. SOB (shortness of breath) on exertion ?[name] notes increasing shortness of breath with exercising and seems to be worsening over time with weight gain. She notes getting out of breath sooner with activity than she used to. This has not gotten worse recently. [name] denies shortness of breath at rest or orthopnea. ? ?3. Essential hypertension ?Taking Toprol, Norvasc, HCTZ, and Accupril. Usually her blood pressure is 130's/70-80's. Medication management by her PCP. ? ?4. Mixed hyperlipidemia ?Intolerant to statins. She is taking Zetia, and medication management per PCP.   ? ?5. Hyperglycemia ?No history of pre-diabetes or diabetes mellitus. She is not on medications.  ? ?6. Vitamin D deficiency ?Takes OTC Vitamin D, unsure of dose. Last Vitamin D level was 49.28 on 08/01/2020. ? ?Assessment/Plan:  ? ?Orders Placed This Encounter  ?Procedures  ? Vitamin B12  ? CBC with Differential/Platelet  ? Comprehensive metabolic panel  ? Folate  ? Hemoglobin A1c  ? Insulin, random  ? Lipid Panel With LDL/HDL Ratio  ? T4  ? TSH  ? VITAMIN D 25 Hydroxy (Vit-D Deficiency, Fractures)  ? EKG 12-Lead  ? ? ?  There are no discontinued medications.  ? ?No orders of the defined types were placed in this encounter. ?  ? ?1. Other fatigue ?[name] does feel that her weight is causing her energy to be lower than it should be. Fatigue may be related to obesity, depression or many other causes. Labs will be ordered, and in the meanwhile, [name] will focus on self care including making healthy food choices, increasing physical  activity and focusing on stress reduction. Recommended the patient to discussed frequent urination with her PCP. ? ?- EKG 12-Lead ?- Vitamin B12 ?- CBC with Differential/Platelet ?- Folate ?- T4 ?- TSH ? ?2. SOB (shortness of breath) on exertion ?[name] does feel that she gets out of breath more easily that she used to when she exercises. [name]'s shortness of breath appears to be obesity related and exercise induced. She has agreed to work on weight loss and gradually increase exercise to treat her exercise induced shortness of breath. Will continue to monitor closely. ? ?3. Essential hypertension ?We will check labs today. Continue medication management per her PCP, and work on prudent nutritional plan and weight loss.  ? ?- Comprehensive metabolic panel ? ?4. Mixed hyperlipidemia ?We will check labs today. Continue medication management per PCP. Follow prudent nutritional plan with low sat/trans fats.  ? ?- Comprehensive metabolic panel ?- Lipid Panel With LDL/HDL Ratio ? ?5. Hyperglycemia ?We will check labs today. She loves her sweets and knows she needs to cut back. Follow her prudent nutritional plan and lose weight.  ? ?- Hemoglobin A1c ?- Insulin, random ? ?6. Vitamin D deficiency ?We will check labs today. Continue OTC supplement until lab results return. Do every 2 year bone density per PCP's recommendation.  ? ?- VITAMIN D 25 Hydroxy (Vit-D Deficiency, Fractures) ? ?7. Depression screening ?[name] had a positive depression screening. Depression is commonly associated with obesity and often results in emotional eating behaviors. We will monitor this closely and work on CBT to help improve the non-hunger eating patterns. Referral to Psychology may be required if no improvement is seen as she continues in our clinic. ? ?8. Class 1 obesity with serious comorbidity and body mass index (BMI) of 33.0 to 33.9 in adult, unspecified obesity type ?[name] is currently in the action stage of change and her goal is to  continue with weight loss efforts. I recommend [name] begin the structured treatment plan as follows: ? ?She has agreed to the Category 1 Plan. ? ?Exercise goals: As is.  ? ?Behavioral modification strategies: meal planning and cooking strategies and planning for success. ? ?She was informed of the importance of frequent follow-up visits to maximize her success with intensive lifestyle modifications for her multiple health conditions. She was informed we would discuss her lab results at her next visit unless there is a critical issue that needs to be addressed sooner. [name] agreed to keep her next visit at the agreed upon time to discuss these results. ? ?Objective:  ? ?Blood pressure (!) 148/80, pulse 80, temperature (!) 97.5 ?F (36.4 ?C), height 5' (1.524 m), weight 170 lb (77.1 kg), last menstrual period 05/14/1968, SpO2 95 %. Body mass index is 33.2 kg/m?. ? ?EKG: Normal sinus rhythm, rate 69 BPM. ? ?Indirect Calorimeter completed today shows a VO2 of 156 and a REE of 1080.  Her calculated basal metabolic rate is [id number] thus her basal metabolic rate is worse than expected. ? ?General: Cooperative, alert, well developed, in no acute distress. ?HEENT: Conjunctivae and lids unremarkable. ?Cardiovascular:  Regular rhythm.  ?Lungs: Normal work of breathing. ?Neurologic: No focal deficits.  ? ?Lab Results  ?Component Value Date  ? CREATININE 0.73 09/05/2021  ? BUN 13 09/05/2021  ? NA 127 (L) 09/05/2021  ? K 4.3 09/05/2021  ? CL 94 (L) 09/05/2021  ? CO2 22 09/05/2021  ? ?Lab Results  ?Component Value Date  ? ALT 27 09/05/2021  ? AST 19 09/05/2021  ? ALKPHOS 58 09/05/2021  ? BILITOT 0.4 09/05/2021  ? ?Lab Results  ?Component Value Date  ? HGBA1C 5.8 (H) 08/29/2021  ? HGBA1C 5.8 02/06/2021  ? HGBA1C 5.6 07/21/2019  ? HGBA1C 5.5 01/17/2018  ? HGBA1C 5.5 07/12/2017  ? ?Lab Results  ?Component Value Date  ? INSULIN 10.0 08/29/2021  ? ?Lab Results  ?Component Value Date  ? TSH 3.530 08/29/2021  ? ?Lab Results  ?Component Value  Date  ? CHOL 273 (H) 08/29/2021  ? HDL 75 08/29/2021  ? LDLCALC 168 (H) 08/29/2021  ? LDLDIRECT 189.0 02/06/2021  ? TRIG 165 (H) 08/29/2021  ? CHOLHDL 5 02/06/2021  ? ?Lab Results  ?Component Value

## 2021-09-20 ENCOUNTER — Other Ambulatory Visit (HOSPITAL_BASED_OUTPATIENT_CLINIC_OR_DEPARTMENT_OTHER): Payer: Self-pay

## 2021-09-20 ENCOUNTER — Ambulatory Visit (INDEPENDENT_AMBULATORY_CARE_PROVIDER_SITE_OTHER): Payer: PPO | Admitting: Family Medicine

## 2021-09-20 ENCOUNTER — Encounter (INDEPENDENT_AMBULATORY_CARE_PROVIDER_SITE_OTHER): Payer: Self-pay | Admitting: Family Medicine

## 2021-09-20 VITALS — BP 148/76 | HR 68 | Temp 98.2°F | Ht 60.0 in | Wt 163.0 lb

## 2021-09-20 DIAGNOSIS — Z6832 Body mass index (BMI) 32.0-32.9, adult: Secondary | ICD-10-CM | POA: Diagnosis not present

## 2021-09-20 DIAGNOSIS — E871 Hypo-osmolality and hyponatremia: Secondary | ICD-10-CM | POA: Diagnosis not present

## 2021-09-20 DIAGNOSIS — E7849 Other hyperlipidemia: Secondary | ICD-10-CM | POA: Diagnosis not present

## 2021-09-20 DIAGNOSIS — R7303 Prediabetes: Secondary | ICD-10-CM

## 2021-09-20 DIAGNOSIS — E669 Obesity, unspecified: Secondary | ICD-10-CM

## 2021-09-20 DIAGNOSIS — E559 Vitamin D deficiency, unspecified: Secondary | ICD-10-CM

## 2021-09-20 NOTE — Telephone Encounter (Signed)
Patient has 7 more days of the quinapril, will start lisinopril after that and have labs at her scheduled appointment 10-03-21 ?

## 2021-09-29 NOTE — Progress Notes (Signed)
Chief Complaint:   OBESITY [name] is here to discuss her progress with her obesity treatment plan along with follow-up of her obesity related diagnoses. [name] is on the Category 1 Plan and states she is following her eating plan approximately 99% of the time. [name] states she is walking on the treadmill for 5 minutes 3 times per week.  Today's visit was #: 2 Starting weight: 170 lbs Starting date: 08/29/2021 Today's weight: 163 lbs Today's date: 09/20/2021 Total lbs lost to date: 7 Total lbs lost since last in-office visit: 7  Interim History: [name] is here today for her first follow-up office visit since starting the program with [place]. All blood work/ lab tests that were recently ordered by myself or an outside provider were reviewed with patient today per their request.   Extended time was spent counseling her on all new disease processes that were discovered or preexisting ones that are affected by BMI. She understands that many of these abnormalities will need to monitored regularly along with the current treatment plan of prudent dietary changes, in which we are making each and every office visit, to improve these health parameters.   [name] was able to eat all of the foods and it went well. She states "it is a lot of food and a lot of meat." [name] denies issues with the meal plan, but she did have several questions about it today. [name] is a little bored with breakfast. She denies hunger or cravings.  Subjective:   1. Prediabetes [name] has a new diagnosis of prediabetes based on her elevated HgA1c. Labs were discussed today. She was informed this puts her at greater risk of developing diabetes. She continues to work on diet and exercise to decrease her risk of diabetes. She denies cravings or hunger.  Lab Results  Component Value Date   HGBA1C 5.8 (H) 08/29/2021   Lab Results  Component Value Date   INSULIN 10.0 08/29/2021   2. Other hyperlipidemia Labs were  discussed today. [name] is unable to tolerate statins, as she is unable to walk on them as they caused too much pain. [name] is taking Zetia now.  3. Hyponatremia [name] was asked to follow up with her PCP regarding her low sodium levels. She did and her PCP changed some of her blood pressure medicines and is repeating labs and following closely. [name] is asymptomatic and denies confusion, lethargy, muscle cramps, or neurological changes.  4. Vitamin D deficiency She is currently taking OTC vitamin D 5,000 each day. Her last bone density was in 3/22 and showed osteoporosis. She denies nausea, vomiting or muscle weakness.  Lab Results  Component Value Date   VD25OH 59.5 08/29/2021   VD25OH 49.28 08/01/2020   VD25OH 48 08/27/2012   Assessment/Plan:  No orders of the defined types were placed in this encounter.   There are no discontinued medications.   No orders of the defined types were placed in this encounter.    1. Prediabetes [name] has no desire for medication. She agrees to continue her PNP and weight loss. [name] was given the handout on prediabetes and insulin resistance.  2. Other hyperlipidemia [name] is not at goal with her LDL at 168, triglycerides at 165, and her HDL was great at 75. [name] agrees to follow her PNP with low saturated and trans fats and to continue to increase activity. Medicine management is per PCP.  3. Hyponatremia Red flags were discussed with [name]. Her PCP discontinued her hydrochlorthiazide  diuretic recently and will be watching her closely.  4. Vitamin D deficiency [name]'s vitamin D is at goal. [name] agrees to continue taking OTC vitamin D 5,000IU QD. She agrees to continue [name] per her PCP, calcium, and vitamin D supplement for her osteoporosis. Education was done.  5. Obesity with current BMI of 32.0 [name] is currently in the action stage of change. As such, her goal is to continue with weight loss efforts. She has agreed to the  Category 1 Plan with breakfast options.   Exercise goals:  As is.  Behavioral modification strategies: increasing lean protein intake, decreasing simple carbohydrates, and better snacking choices.  [name] has agreed to follow-up with our clinic in 3 weeks. She was informed of the importance of frequent follow-up visits to maximize her success with intensive lifestyle modifications for her multiple health conditions.   Objective:   Blood pressure (!) 148/76, pulse 68, temperature 98.2 F (36.8 C), height 5' (1.524 m), weight 163 lb (73.9 kg), last menstrual period 05/14/1968, SpO2 97 %. Body mass index is 31.83 kg/m.  General: Cooperative, alert, well developed, in no acute distress. HEENT: Conjunctivae and lids unremarkable. Cardiovascular: Regular rhythm.  Lungs: Normal work of breathing. Neurologic: No focal deficits.   Lab Results  Component Value Date   CREATININE 0.73 09/05/2021   BUN 13 09/05/2021   NA 127 (L) 09/05/2021   K 4.3 09/05/2021   CL 94 (L) 09/05/2021   CO2 22 09/05/2021   Lab Results  Component Value Date   ALT 27 09/05/2021   AST 19 09/05/2021   ALKPHOS 58 09/05/2021   BILITOT 0.4 09/05/2021   Lab Results  Component Value Date   HGBA1C 5.8 (H) 08/29/2021   HGBA1C 5.8 02/06/2021   HGBA1C 5.6 07/21/2019   HGBA1C 5.5 01/17/2018   HGBA1C 5.5 07/12/2017   Lab Results  Component Value Date   INSULIN 10.0 08/29/2021   Lab Results  Component Value Date   TSH 3.530 08/29/2021   Lab Results  Component Value Date   CHOL 273 (H) 08/29/2021   HDL 75 08/29/2021   LDLCALC 168 (H) 08/29/2021   LDLDIRECT 189.0 02/06/2021   TRIG 165 (H) 08/29/2021   CHOLHDL 5 02/06/2021   Lab Results  Component Value Date   VD25OH 59.5 08/29/2021   VD25OH 49.28 08/01/2020   VD25OH 48 08/27/2012   Lab Results  Component Value Date   WBC 12.3 (H) 09/05/2021   HGB 13.0 09/05/2021   HCT 38.9 09/05/2021   MCV 89.6 09/05/2021   PLT 384.0 09/05/2021   No results  found for: IRON, TIBC, FERRITIN  Obesity Behavioral Intervention:   Approximately 15 minutes were spent on the discussion below.  ASK: We discussed the diagnosis of obesity with [name] today and [name] agreed to give [place] permission to discuss obesity behavioral modification therapy today.  ASSESS: [name] has the diagnosis of obesity and her BMI today is 32.0. [name] is in the action stage of change.   ADVISE: [name] was educated on the multiple health risks of obesity as well as the benefit of weight loss to improve her health. She was advised of the need for long term treatment and the importance of lifestyle modifications to improve her current health and to decrease her risk of future health problems.  AGREE: Multiple dietary modification options and treatment options were discussed and [name] agreed to follow the recommendations documented in the above note.  ARRANGE: [name] was educated on the importance of frequent visits to treat  obesity as outlined per CMS and USPSTF guidelines and agreed to schedule her next follow up appointment today.  Attestation Statements:   Reviewed by clinician on day of visit: allergies, medications, problem list, medical history, surgical history, family history, social history, and previous encounter notes.  Time spent on visit including pre-visit chart review and post-visit care and charting was 40 minutes.   IMarcille Blanco, CMA, am acting as transcriptionist for [employer], DO  I have reviewed the above documentation for accuracy and completeness, and I agree with the above. Marjory Sneddon, D.O.  [place] was signed into law in 2016 which includes the topic of electronic health records.  This provides immediate access to information in MyChart.  This includes consultation notes, operative notes, office notes, lab results and pathology reports.  If you have any questions about what you read please let us know at your next  visit so we can discuss your concerns and take corrective action if need be.  We are right here with you.

## 2021-10-02 ENCOUNTER — Other Ambulatory Visit (HOSPITAL_BASED_OUTPATIENT_CLINIC_OR_DEPARTMENT_OTHER): Payer: Self-pay

## 2021-10-03 ENCOUNTER — Telehealth: Payer: Self-pay | Admitting: Family

## 2021-10-03 ENCOUNTER — Ambulatory Visit (INDEPENDENT_AMBULATORY_CARE_PROVIDER_SITE_OTHER): Payer: PPO | Admitting: Family

## 2021-10-03 VITALS — BP 147/54 | HR 62 | Temp 98.1°F | Resp 16 | Wt 164.0 lb

## 2021-10-03 DIAGNOSIS — M810 Age-related osteoporosis without current pathological fracture: Secondary | ICD-10-CM

## 2021-10-03 DIAGNOSIS — R739 Hyperglycemia, unspecified: Secondary | ICD-10-CM

## 2021-10-03 DIAGNOSIS — E871 Hypo-osmolality and hyponatremia: Secondary | ICD-10-CM | POA: Diagnosis not present

## 2021-10-03 DIAGNOSIS — E782 Mixed hyperlipidemia: Secondary | ICD-10-CM | POA: Diagnosis not present

## 2021-10-03 DIAGNOSIS — M5416 Radiculopathy, lumbar region: Secondary | ICD-10-CM

## 2021-10-03 DIAGNOSIS — I10 Essential (primary) hypertension: Secondary | ICD-10-CM | POA: Diagnosis not present

## 2021-10-03 LAB — BASIC METABOLIC PANEL
BUN: 10 mg/dL (ref 6–23)
CO2: 22 mEq/L (ref 19–32)
Calcium: 9.2 mg/dL (ref 8.4–10.5)
Chloride: 100 mEq/L (ref 96–112)
Creatinine, Ser: 0.67 mg/dL (ref 0.40–1.20)
GFR: 82.52 mL/min (ref >60.00)
Glucose, Bld: 105 mg/dL — ABNORMAL HIGH (ref 70–99)
Potassium: 4.9 mEq/L (ref 3.5–5.1)
Sodium: 130 mEq/L — ABNORMAL LOW (ref 135–145)

## 2021-10-03 NOTE — Assessment & Plan Note (Signed)
Unchanged. She is managing with prn ibuprofen. Discussed trying to add some water aerobics to help promote weight loss and help with pain.

## 2021-10-03 NOTE — Assessment & Plan Note (Signed)
Lab Results  Component Value Date   HGBA1C 5.8 (H) 08/29/2021   Stable.  Continue diet/exercise/weight loss efforts.

## 2021-10-03 NOTE — Assessment & Plan Note (Signed)
Lab Results  Component Value Date   CHOL 273 (H) 08/29/2021   HDL 75 08/29/2021   LDLCALC 168 (H) 08/29/2021   LDLDIRECT 189.0 02/06/2021   TRIG 165 (H) 08/29/2021   CHOLHDL 5 02/06/2021   Continue diet, zetia. Intolerant to statins.

## 2021-10-03 NOTE — Assessment & Plan Note (Signed)
Will repeat sodium.

## 2021-10-03 NOTE — Telephone Encounter (Signed)
Sodium is still a bit low. I would like her to return to the lab for some additional urine sodium tests please.

## 2021-10-03 NOTE — Assessment & Plan Note (Signed)
BP Readings from Last 3 Encounters:  10/03/21 (!) 147/54  09/20/21 (!) 148/76  09/14/21 137/74    Fair bp for age. Continue toprol xl and lisinopril/amlodipine.

## 2021-10-03 NOTE — Assessment & Plan Note (Signed)
>>  ASSESSMENT AND PLAN FOR HYPERGLYCEMIA WRITTEN ON 10/03/2021 11:04 AM BY O'SULLIVAN, Alyzae Hawkey, NP  Lab Results  Component Value Date   HGBA1C 5.8 (H) 08/29/2021   Stable.  Continue diet/exercise/weight loss efforts.

## 2021-10-03 NOTE — Progress Notes (Signed)
Subjective:   By signing my name below, I, [name], attest that this documentation has been prepared under the direction and in the presence of Sandford Craze'Sullivan, Klare Criss NP, 10/03/2021     Patient ID: [name], female    DOB: 11-Feb-1941, 81 y.o.   MRN: [id number]  Chief Complaint  Patient presents with   Prediabetes    Here for follow up   Hypertension    Here for follow up    HPI Patient is in today for an office visit.  Pain in Right Leg - She complains of sharp pain in her right leg that radiates up to her groin and back. She states that she had trouble walking to the bathroom due to the pain. Due to her pain, it is making it difficult to exercise. 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  Blood Pressure - She is currently taking 10 Mg of Amlodipine, 40 Mg of Lisinopril, and 75 Mg of Metoprolol Succinate. As of today's visit, her blood pressure is slightly elevated. BP Readings from Last 3 Encounters:  10/03/21 (!) 147/54  09/20/21 (!) 148/76  09/14/21 137/74   Pulse Readings from Last 3 Encounters:  10/03/21 62  09/20/21 68  09/14/21 61  Cholesterol - She is currently taking 10 Mg of Zetia which she reports helps a little bit but not a lot  Lab Results  Component Value Date   CHOL 273 (H) 08/29/2021   HDL 75 08/29/2021   LDLCALC 168 (H) 08/29/2021   LDLDIRECT 189.0 02/06/2021   TRIG 165 (H) 08/29/2021   CHOLHDL 5 02/06/2021  Prolia Injection - She has received her first [employer]rolia Shot.   There are no preventive care reminders to display for this patient.  Past Medical History:  Diagnosis Date   Arthritis    osteoarthritis   Back pain    Barrett's esophagus    Cataract    bilateral cateracts removed   Chronic kidney disease    kidney stones   Chronic obstructive bronchitis (HCC)    Dr Delford FieldWright   Colitis    Colon polyps    GERD (gastroesophageal reflux disease)    History of chicken pox    History of nephrolithiasis    History  of transfusion of packed red blood cells    Hypercholesteremia    Hyperlipidemia    Hypertension    IFG (impaired fasting glucose)    Osteoarthritis    Osteoporosis    pt cannot tolerate fosamax   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   Swelling of both lower extremities    Urinary incontinence     Past Surgical History:  Procedure Laterality Date   ABDOMINAL HYSTERECTOMY  1970   APPENDECTOMY     CHOLECYSTECTOMY     LEG SURGERY  2008   right   TOTAL KNEE ARTHROPLASTY  04/2006   total left knee replacement    Family History  Problem Relation Age of Onset   Coronary artery disease Mother    Arthritis Mother    High blood pressure Mother    Heart disease Mother    High blood pressure Father    Heart disease Father    Asthma Maternal Grandmother    Tuberculosis Other    Colon cancer Neg Hx    Esophageal cancer Neg Hx    Rectal cancer Neg Hx    Stomach cancer Neg Hx  Social History   Socioeconomic History   Marital status: Widowed    Spouse name: Not on file   Number of children: Not on file   Years of education: Not on file   Highest education level: Not on file  Occupational History   Occupation: Retired  Tobacco Use   Smoking status: Former    Years: 15.00    Types: Cigarettes    Quit date: 05/15/1967    Years since quitting: 54.4   Smokeless tobacco: Never  Substance and Sexual Activity   Alcohol use: No   Drug use: No   Sexual activity: Never  Other Topics Concern   Not on file  Social History Narrative   Regular exercise: yes   Social Determinants of Health   Financial Resource Strain: Low Risk    Difficulty of Paying Living Expenses: Not hard at all  Food Insecurity: No Food Insecurity   Worried About Programme researcher, broadcasting/film/video in the Last Year: Never true   Ran Out of Food in the Last Year: Never true  Transportation Needs: No Transportation Needs   Lack of Transportation (Medical): No   Lack of  Transportation (Non-Medical): No  Physical Activity: Inactive   Days of Exercise per Week: 0 days   Minutes of Exercise per Session: 0 min  Stress: No Stress Concern Present   Feeling of Stress : Not at all  Social Connections: Moderately Integrated   Frequency of Communication with Friends and Family: More than three times a week   Frequency of Social Gatherings with Friends and Family: More than three times a week   Attends Religious Services: More than 4 times per year   Active Member of [employer] or Organizations: Yes   Attends Banker Meetings: More than 4 times per year   Marital Status: Widowed  Catering manager Violence: Not At Risk   Fear of Current or Ex-Partner: No   Emotionally Abused: No   Physically Abused: No   Sexually Abused: No    Outpatient Medications Prior to Visit  Medication Sig Dispense Refill   amLODipine (NORVASC) 10 MG tablet TAKE 1 TABLET (10 MG TOTAL) BY MOUTH DAILY. 90 tablet 1   Ascorbic Acid (VITAMIN C) 1000 MG tablet Take 1,000 mg by mouth daily.     aspirin (ASPIRIN 81) 81 MG EC tablet Take 1 tablet (81 mg total) by mouth daily. Swallow whole. 30 tablet 12   carboxymethylcellulose (REFRESH PLUS) 0.5 % SOLN Place 1 drop into both eyes 2 (two) times daily.     Cholecalciferol (VITAMIN D) 125 MCG (5000 UT) CAPS Take 1 capsule by mouth daily.     Coenzyme Q10 200 MG capsule Take 200 mg by mouth daily.     denosumab (PROLIA) 60 MG/ML SOSY injection Inject 60 mg into the skin every 6 (six) months.     ezetimibe (ZETIA) 10 MG tablet Take 1 tablet (10 mg total) by mouth daily. 90 tablet 1   ibuprofen (ADVIL,MOTRIN) 200 MG tablet Take 400 mg by mouth daily.     lisinopril (ZESTRIL) 20 MG tablet Take 2 tablets (40 mg total) by mouth daily. 60 tablet 2   metoprolol succinate (TOPROL-XL) 50 MG 24 hr tablet Take 1&1/2 tablets (75 mg total) by mouth daily. Take with or immediately following a meal. 135 tablet 0   Multiple Vitamin (MULTIVITAMIN) tablet  Take 1 tablet by mouth daily.     niacin 500 MG tablet Take 500 mg by mouth at bedtime.  Omega-3 Fatty Acids (FISH OIL) 1000 MG CAPS Take 1 capsule by mouth every morning.     omeprazole (PRILOSEC) 40 MG capsule Take 1 capsule (40 mg total) by mouth daily. 90 capsule 1   potassium chloride (KLOR-CON M) 10 MEQ tablet Take 1 tablet (10 mEq total) by mouth daily. 90 tablet 1   No facility-administered medications prior to visit.    Allergies  Allergen Reactions   Atorvastatin Other (See Comments)    REACTION: MUSCLE PAINS   Diclofenac-Misoprostol Diarrhea and Nausea And Vomiting   Fenofibrate Other (See Comments)    REACTION: carpopedal spasms   Rosuvastatin Other (See Comments)    REACTION: MUSCLE PAINS   Statins Other (See Comments)    myalgias   Amoxicillin-Pot Clavulanate Other (See Comments)    REACTION: unspecified   Penicillins Hives   Sulfonamide Derivatives Hives   Tuberculin Ppd Other (See Comments)    Redness at sight    Review of Systems  Musculoskeletal:  Positive for myalgias (Pain in Right Leg, Radiates to Groin and Back).      Objective:    Physical Exam Constitutional:      General: She is not in acute distress.    Appearance: Normal appearance. She is not ill-appearing.  HENT:     Head: Normocephalic and atraumatic.     Right Ear: External ear normal.     Left Ear: External ear normal.  Eyes:     Extraocular Movements: Extraocular movements intact.     Pupils: Pupils are equal, round, and reactive to light.  Cardiovascular:     Rate and Rhythm: Normal rate and regular rhythm.     Heart sounds: Normal heart sounds. No murmur heard.   No gallop.  Pulmonary:     Effort: Pulmonary effort is normal. No respiratory distress.     Breath sounds: Normal breath sounds. No wheezing or rales.  Skin:    General: Skin is warm and dry.  Neurological:     Mental Status: She is alert and oriented to person, place, and time.  Psychiatric:        Mood and  Affect: Mood normal.        Behavior: Behavior normal.        Judgment: Judgment normal.    BP (!) 147/54 (BP Location: Right Arm, Patient Position: Sitting, Cuff Size: Large)   Pulse 62   Temp 98.1 F (36.7 C) (Oral)   Resp 16   Wt 164 lb (74.4 kg)   LMP 05/14/1968   SpO2 98%   BMI 32.03 kg/m  Wt Readings from Last 3 Encounters:  10/03/21 164 lb (74.4 kg)  09/20/21 163 lb (73.9 kg)  09/14/21 170 lb (77.1 kg)       Assessment & Plan:   Problem List Items Addressed This Visit       Unprioritized   Osteoporosis    Continues prolia every 6 months.        Mixed hyperlipidemia    Lab Results  Component Value Date   CHOL 273 (H) 08/29/2021   HDL 75 08/29/2021   LDLCALC 168 (H) 08/29/2021   LDLDIRECT 189.0 02/06/2021   TRIG 165 (H) 08/29/2021   CHOLHDL 5 02/06/2021  Continue diet, zetia. Intolerant to statins.       Lumbar radiculopathy, chronic    Unchanged. She is managing with prn ibuprofen. Discussed trying to add some water aerobics to help promote weight loss and help with pain.  Hyponatremia - Primary    Will repeat sodium.        Relevant Orders   Basic metabolic panel   Hyperglycemia    Lab Results  Component Value Date   HGBA1C 5.8 (H) 08/29/2021  Stable.  Continue diet/exercise/weight loss efforts.       Essential hypertension    BP Readings from Last 3 Encounters:  10/03/21 (!) 147/54  09/20/21 (!) 148/76  09/14/21 137/74   Fair bp for age. Continue toprol xl and lisinopril/amlodipine.          No orders of the defined types were placed in this encounter.   I, Lemont Fillers, NP, personally preformed the services described in this documentation.  All medical record entries made by the scribe were at my direction and in my presence.  I have reviewed the chart and discharge instructions (if applicable) and agree that the record reflects my personal performance and is accurate and complete. 10/03/2021   I,Amber  Collins,acting as a scribe for Lemont Fillers, NP.,have documented all relevant documentation on the behalf of Lemont Fillers, NP,as directed by  Lemont Fillers, NP while in the presence of Lemont Fillers, NP.    Lemont Fillers, NP

## 2021-10-03 NOTE — Assessment & Plan Note (Signed)
>>  ASSESSMENT AND PLAN FOR ESSENTIAL HYPERTENSION WRITTEN ON 10/03/2021 11:04 AM BY O'SULLIVAN, Eryck Negron, NP  BP Readings from Last 3 Encounters:  10/03/21 (!) 147/54  09/20/21 (!) 148/76  09/14/21 137/74    Fair bp for age. Continue toprol xl and lisinopril/amlodipine.

## 2021-10-03 NOTE — Assessment & Plan Note (Signed)
Continues prolia every 6 months.

## 2021-10-04 NOTE — Telephone Encounter (Signed)
Patient advised of results and scheduled to be here tomorrow morning.

## 2021-10-05 ENCOUNTER — Other Ambulatory Visit: Payer: PPO

## 2021-10-05 DIAGNOSIS — E871 Hypo-osmolality and hyponatremia: Secondary | ICD-10-CM | POA: Diagnosis not present

## 2021-10-10 ENCOUNTER — Ambulatory Visit (INDEPENDENT_AMBULATORY_CARE_PROVIDER_SITE_OTHER): Payer: PPO | Admitting: Family Medicine

## 2021-10-10 ENCOUNTER — Encounter (INDEPENDENT_AMBULATORY_CARE_PROVIDER_SITE_OTHER): Payer: Self-pay | Admitting: Family Medicine

## 2021-10-10 VITALS — BP 145/72 | HR 65 | Temp 97.6°F | Ht 60.0 in | Wt 159.0 lb

## 2021-10-10 DIAGNOSIS — E559 Vitamin D deficiency, unspecified: Secondary | ICD-10-CM | POA: Diagnosis not present

## 2021-10-10 DIAGNOSIS — R7303 Prediabetes: Secondary | ICD-10-CM | POA: Diagnosis not present

## 2021-10-10 DIAGNOSIS — E871 Hypo-osmolality and hyponatremia: Secondary | ICD-10-CM | POA: Diagnosis not present

## 2021-10-10 DIAGNOSIS — Z6831 Body mass index (BMI) 31.0-31.9, adult: Secondary | ICD-10-CM

## 2021-10-10 DIAGNOSIS — E669 Obesity, unspecified: Secondary | ICD-10-CM | POA: Diagnosis not present

## 2021-10-10 LAB — OSMOLALITY, URINE: Osmolality, Ur: 218 mOsm/kg (ref 50–1200)

## 2021-10-10 LAB — SODIUM, URINE, RANDOM: Sodium, Ur: 20 mmol/L — ABNORMAL LOW (ref 28–272)

## 2021-10-12 DIAGNOSIS — R7303 Prediabetes: Secondary | ICD-10-CM | POA: Insufficient documentation

## 2021-10-12 DIAGNOSIS — E559 Vitamin D deficiency, unspecified: Secondary | ICD-10-CM | POA: Insufficient documentation

## 2021-10-12 NOTE — Progress Notes (Signed)
Chief Complaint:   OBESITY [name] is here to discuss her progress with her obesity treatment plan along with follow-up of her obesity related diagnoses. [name] is on the Category 1 Plan with breakfast options and states she is following her eating plan approximately 99% of the time. [name] states she is not exercising due to back pain.  Today's visit was #: 3 Starting weight: 170 lbs Starting date: 08/29/2021 Today's weight: 159 lbs Today's date: 10/10/2021 Total lbs lost to date: 11 lbs Total lbs lost since last in-office visit: 4 lbs  Interim History: [name] likes the breakfast options with fairlife skim milk.  She loves the [employer] sauces we discussed last office visit.  [name] denies hunger or cravings.  She can eat all of her protein now.  Snacks = 30 calories ice pops.  Subjective:   1. Prediabetes [name] has no questions about new diagnosis.  She read the handouts and has a better understanding of why she need to eat on plan.   2. Vitamin D deficiency [name] is taking 5,000 IU OTC daily.  She is at goal, last vitamin D level 59.5.  [name] gets Prolia injections every 6 months  3. Hyponat,remia Seven days a go sodium went from 127 to now 130, in addition chloride from 94 to 100.  [name] is asymptomatic  and feels less sluggish now.   Assessment/Plan:  No orders of the defined types were placed in this encounter.   There are no discontinued medications.   No orders of the defined types were placed in this encounter.    1. Prediabetes [name] has agreed to continue prudent nutritional plan and weight loss.   2. Vitamin D deficiency [name] has agreed to continue OTC vitamin d, she is at goal.  Continue every 2 year bone densities and start with weight bearing exercises.   3. Hyponatremia Our plan is to improve sodium level, continue to decrease water intake and continue monitoring per PCP guidelines.   4. Obesity with current BMI of 31.1 [name] is currently in  the action stage of change. As such, her goal is to continue with weight loss efforts. She has agreed to the Category 1 Plan + breakfast options.   Exercise goals:  As is.   Behavioral modification strategies: increasing lean protein intake and decreasing simple carbohydrates.  [name] has agreed to follow-up with our clinic in 2-3 weeks. She was informed of the importance of frequent follow-up visits to maximize her success with intensive lifestyle modifications for her multiple health conditions.   Objective:   Blood pressure (!) 145/72, pulse 65, temperature 97.6 F (36.4 C), height 5' (1.524 m), weight 159 lb (72.1 kg), last menstrual period 05/14/1968, SpO2 95 %. Body mass index is 31.05 kg/m.  General: Cooperative, alert, well developed, in no acute distress. HEENT: Conjunctivae and lids unremarkable. Cardiovascular: Regular rhythm.  Lungs: Normal work of breathing. Neurologic: No focal deficits.   Lab Results  Component Value Date   CREATININE 0.67 10/03/2021   BUN 10 10/03/2021   NA 130 (L) 10/03/2021   K 4.9 10/03/2021   CL 100 10/03/2021   CO2 22 10/03/2021   Lab Results  Component Value Date   ALT 27 09/05/2021   AST 19 09/05/2021   ALKPHOS 58 09/05/2021   BILITOT 0.4 09/05/2021   Lab Results  Component Value Date   HGBA1C 5.8 (H) 08/29/2021   HGBA1C 5.8 02/06/2021   HGBA1C 5.6 07/21/2019   HGBA1C 5.5 01/17/2018  HGBA1C 5.5 07/12/2017   Lab Results  Component Value Date   INSULIN 10.0 08/29/2021   Lab Results  Component Value Date   TSH 3.530 08/29/2021   Lab Results  Component Value Date   CHOL 273 (H) 08/29/2021   HDL 75 08/29/2021   LDLCALC 168 (H) 08/29/2021   LDLDIRECT 189.0 02/06/2021   TRIG 165 (H) 08/29/2021   CHOLHDL 5 02/06/2021   Lab Results  Component Value Date   VD25OH 59.5 08/29/2021   VD25OH 49.28 08/01/2020   VD25OH 48 08/27/2012   Lab Results  Component Value Date   WBC 12.3 (H) 09/05/2021   HGB 13.0 09/05/2021   HCT  38.9 09/05/2021   MCV 89.6 09/05/2021   PLT 384.0 09/05/2021   No results found for: IRON, TIBC, FERRITIN  Obesity Behavioral Intervention:   Approximately 15 minutes were spent on the discussion below.  ASK: We discussed the diagnosis of obesity with Ketty today and Nychelle agreed to give [place] permission to discuss obesity behavioral modification therapy today.  ASSESS: Aleen has the diagnosis of obesity and her BMI today is 31.1. Timmya is in the action stage of change.   ADVISE: Elezabeth was educated on the multiple health risks of obesity as well as the benefit of weight loss to improve her health. She was advised of the need for long term treatment and the importance of lifestyle modifications to improve her current health and to decrease her risk of future health problems.  AGREE: Multiple dietary modification options and treatment options were discussed and Cathyrn agreed to follow the recommendations documented in the above note.  ARRANGE: Hasina was educated on the importance of frequent visits to treat obesity as outlined per CMS and USPSTF guidelines and agreed to schedule her next follow up appointment today.  Attestation Statements:   Reviewed by clinician on day of visit: allergies, medications, problem list, medical history, surgical history, family history, social history, and previous encounter notes.  I, Davy Pique, RMA, am acting as Location manager for [employer], DO.  I have reviewed the above documentation for accuracy and completeness, and I agree with the above. [name], D.O.  The [place] was signed into law in 2016 which includes the topic of electronic health records.  This provides immediate access to information in MyChart.  This includes consultation notes, operative notes, office notes, lab results and pathology reports.  If you have any questions about what you read please let us know at your next visit so we can discuss  your concerns and take corrective action if need be.  We are right here with you.

## 2021-10-16 ENCOUNTER — Other Ambulatory Visit (HOSPITAL_BASED_OUTPATIENT_CLINIC_OR_DEPARTMENT_OTHER): Payer: Self-pay

## 2021-10-19 ENCOUNTER — Other Ambulatory Visit (HOSPITAL_BASED_OUTPATIENT_CLINIC_OR_DEPARTMENT_OTHER): Payer: Self-pay

## 2021-10-19 ENCOUNTER — Other Ambulatory Visit: Payer: Self-pay | Admitting: Family

## 2021-10-19 MED ORDER — AMLODIPINE BESYLATE 10 MG PO TABS
ORAL_TABLET | Freq: Every day | ORAL | 1 refills | Status: DC
  Filled 2021-10-19: qty 90, 90d supply, fill #0
  Filled 2022-01-17: qty 90, 90d supply, fill #1

## 2021-10-20 ENCOUNTER — Telehealth: Payer: PPO

## 2021-10-25 ENCOUNTER — Encounter: Payer: Self-pay | Admitting: Family

## 2021-10-30 ENCOUNTER — Ambulatory Visit (INDEPENDENT_AMBULATORY_CARE_PROVIDER_SITE_OTHER): Payer: PPO | Admitting: Pharmacist

## 2021-10-30 DIAGNOSIS — E782 Mixed hyperlipidemia: Secondary | ICD-10-CM

## 2021-10-30 DIAGNOSIS — E871 Hypo-osmolality and hyponatremia: Secondary | ICD-10-CM

## 2021-10-30 DIAGNOSIS — I10 Essential (primary) hypertension: Secondary | ICD-10-CM

## 2021-10-30 DIAGNOSIS — M810 Age-related osteoporosis without current pathological fracture: Secondary | ICD-10-CM

## 2021-10-30 NOTE — Patient Instructions (Signed)
Mrs. Galambos It was a pleasure speaking with you  Below is a summary of your health goals and care plan  Patient Goals/Self-Care Activities take medications as prescribed,  check blood pressure 2 to 3 times per week and document, and provide at future appointments, and  Continue to walk daily 5 to 10 minutes and increase as able. Increase lisinopril to take 20mg  tablet - 2 tablets = 40mg  total every day.  Continue to drink sugar free Sports drink for sodium / electrolytes (can also add food with sodium - carrots, celery, avocados or beets)    If you have any questions or concerns, please feel free to contact me either at the phone number below or with a MyChart message.   Keep up the good work!  , PharmD Clinical Pharmacist [facility] [phone] (direct line)  [phone] (main office number)   Patient verbalizes understanding of instructions and care plan provided today and agrees to view in MyChart. Active MyChart status and patient understanding of how to access instructions and care plan via MyChart confirmed with patient.

## 2021-10-30 NOTE — Chronic Care Management (AMB) (Signed)
Chronic Care Management Pharmacy Note  10/30/2021 Name:  [name] MRN:  [id number] DOB:  Aug 12, 1940  Summary:  Hypertension: last office blood pressure was elevated. Patient has not been checking at home due to blood pressure cuff not working. She has [employer] PPO plan that does not have over-the-counter benefits to purchase blood pressure cuff. Discussed blood pressure goal. She was recently changed from quinapril 28m daily to lisinopril 462mdaily but looks like 209mose of dispensed with instructions to take 2 tablets = 80m56mily however patient states she missed instruction to take 2 tablets and has only been taking lisinopril 20mg22mly. She will start taking 80mg 47my today. Continue metoprolol succinate 50mg -47me 1.5 tablet daily and amlodipine 10mg da17mHyponatremia: improved form 127 to 130 since hydrochlorothiazide stopped. Patient is drinking Sports drink with no sugar - 20 ounces daily. Reports that she feels better since sodium improved. Discussed other low calorie foods that contain sodium - beets, carrots, celery and avocados.  Pre-Diabetes: seeing weight management clinic and is doing a great job following low sugar / carbohydrate diet and has lost about 11lbs.Great job! Hyperlipidemia: LDL elevated. Taking ezetimibe 10mg dai26mPatient declines to retry ANY statin.   Subjective: [name] y.o. y78r old female who is a primary patient of O'SullivaDebbrah Alare CCM team was consulted for assistance with disease management and care coordination needs.    Engaged with patient by telephone for follow up visit in response to provider referral for pharmacy case management and/or care coordination services.   Consent to Services:  The patient was given information about Chronic Care Management services, agreed to services, and gave verbal consent prior to initiation of services.  Please see initial visit note for detailed documentation.   Patient Care  Team: O'SullivaDebbrah AlarCP - General Brodie, DOlevia PerchesMLowella Bandyctive) as Consulting Physician (Gastroenterology) Ramos, RiSuella Broadonsulting Physician (Physical Medicine and Rehabilitation) Braiden Presutti, TCherre Robins ([place])  Recent office visits: 10/03/2021 - Int Med (O'SullivInda Castleup hyponatremia. BP slightly elevated since stopping HCTZ but sodium has improved - has increased.  09/19/2021 - Phone Call. Pharmacy cannot get quinapril. Changed to lisinopril 80mg dail67m04/25/2023 - Phone call - sodium on CMP was lower. Stopped HCTZ; increased metoprolol succinate to 75mg daily48m/25/2023 - Int Med (O'SullivanInda Castlefor leukocytosis / abnormal labs. Recheck CBC, CMP and serum protein electrophoresis. 08/25/2021 - Int Med (O'SullivanInda Castlelumbar radiculopathy. Prescribed fluconazole 150mg once a80meded and methyprednisolone 4mg dose pac55m4/03/2022 - Prolia Injection   Recent consult visits: 10/10/2021 - Weight Management (Dr Opalski) Has Miranda Scarletmore lbs for a total of 11lbs. No med changes noted.  09/20/2021 - Weight Management (Dr Opalski) SeenRaliegh Scarlete DM and weight management. Weight decreased by  7lbs.  08/29/2021 - Weight Management (Dr Opalski) SeenRaliegh Scarlettigue / weight loss. Labs ordered. No med changes noted.  Hospital visits: None in previous 6 months  Objective:  Lab Results  Component Value Date   CREATININE 0.67 10/03/2021   CREATININE 0.73 09/05/2021   CREATININE 0.63 08/29/2021    Lab Results  Component Value Date   HGBA1C 5.8 (H) 08/29/2021   Last diabetic Eye exam: No results found for: "HMDIABEYEEXA"  Last diabetic Foot exam: No results found for: "HMDIABFOOTEX"      Component Value Date/Time   CHOL 273 (H) 08/29/2021 1127   TRIG 165 (H) 08/29/2021 1127  HDL 75 08/29/2021 1127   CHOLHDL 5 02/06/2021 0909   VLDL 40.6 (H) 02/06/2021 0909   LDLCALC 168 (H) 08/29/2021 1127   LDLCALC 170 (H) 02/01/2020 0834   LDLDIRECT 189.0  02/06/2021 0909       Latest Ref Rng & Units 09/05/2021    9:27 AM 08/29/2021   11:27 AM 02/06/2021    9:09 AM  Hepatic Function  Total Protein 6.0 - 8.3 g/dL 6.1 - 8.1 g/dL 6.6    6.8  7.3  6.5   Albumin 3.5 - 5.2 g/dL 4.4  5.0  4.2   AST 0 - 37 U/L '19  18  18   ' ALT 0 - 35 U/L '27  23  20   ' Alk Phosphatase 39 - 117 U/L 58  64  65   Total Bilirubin 0.2 - 1.2 mg/dL 0.4  0.3  0.5     Lab Results  Component Value Date/Time   TSH 3.530 08/29/2021 11:27 AM   TSH 2.25 06/10/2008 08:23 AM       Latest Ref Rng & Units 09/05/2021    9:27 AM 08/29/2021   11:27 AM 11/18/2015   11:24 AM  CBC  WBC 4.0 - 10.5 K/uL 12.3  17.1  9.8   Hemoglobin 12.0 - 15.0 g/dL 13.0  13.5  14.3   Hematocrit 36.0 - 46.0 % 38.9  39.8  42.9   Platelets 150.0 - 400.0 K/uL 384.0  530  370.0     Lab Results  Component Value Date/Time   VD25OH 59.5 08/29/2021 11:27 AM   VD25OH 49.28 08/01/2020 08:34 AM   VD25OH 48 08/27/2012 08:48 AM    Clinical ASCVD: No  The ASCVD Risk score (Arnett DK, et al., 2019) failed to calculate for the following reasons:   The 2019 ASCVD risk score is only valid for ages 76 to 32     Social History   Tobacco Use  Smoking Status Former   Years: 15.00   Types: Cigarettes   Quit date: 05/15/1967   Years since quitting: 54.4  Smokeless Tobacco Never   BP Readings from Last 3 Encounters:  10/10/21 (!) 145/72  10/03/21 (!) 147/54  09/20/21 (!) 148/76   Pulse Readings from Last 3 Encounters:  10/10/21 65  10/03/21 62  09/20/21 68   Wt Readings from Last 3 Encounters:  10/10/21 159 lb (72.1 kg)  10/03/21 164 lb (74.4 kg)  09/20/21 163 lb (73.9 kg)    Assessment: Review of patient past medical history, allergies, medications, health status, including review of consultants reports, laboratory and other test data, was performed as part of comprehensive evaluation and provision of chronic care management services.   SDOH:  (Social Determinants of Health) assessments and  interventions performed:  SDOH Interventions    Flowsheet Row Most Recent Value  SDOH Interventions   Physical Activity Interventions Other (Comments)  [patient is slowly increasing activity - walking about 5 minutes a day.]        CCM Care Plan  Allergies  Allergen Reactions   Atorvastatin Other (See Comments)    REACTION: MUSCLE PAINS   Diclofenac-Misoprostol Diarrhea and Nausea And Vomiting   Fenofibrate Other (See Comments)    REACTION: carpopedal spasms   Rosuvastatin Other (See Comments)    REACTION: MUSCLE PAINS   Statins Other (See Comments)    myalgias   Amoxicillin-Pot Clavulanate Other (See Comments)    REACTION: unspecified   Penicillins Hives   Sulfonamide Derivatives Hives   Tuberculin Ppd Other (  See Comments)    Redness at sight    Medications Reviewed Today     Reviewed by Cherre Robins, RPH-CPP (Pharmacist) on 10/30/21 at 0917  Med List Status: <None>   Medication Order Taking? Sig Documenting Provider Last Dose Status Informant  amLODipine (NORVASC) 10 MG tablet [id number] Yes TAKE 1 TABLET (10 MG TOTAL) BY MOUTH DAILY. [name], NP Taking Active   Ascorbic Acid (VITAMIN C) 1000 MG tablet [id number] Yes Take 1,000 mg by mouth daily. [provider] Taking Active Self  aspirin (ASPIRIN 81) 81 MG EC tablet [id number] Yes Take 1 tablet (81 mg total) by mouth daily. Swallow whole. [name], NP Taking Active   Calcium Carb-Cholecalciferol (CALCIUM 600+D) 600-10 MG-MCG TABS [id number] Yes Take 600 mg by mouth daily. [provider] Taking Active   carboxymethylcellulose (REFRESH PLUS) 0.5 % SOLN [id number] Yes Place 1 drop into both eyes 2 (two) times daily. [provider] Taking Active Self  Cholecalciferol (VITAMIN D) 125 MCG (5000 UT) CAPS [id number] Yes Take 1 capsule by mouth daily. [provider] Taking Active   Coenzyme Q10 200 MG capsule [id number] Yes Take 200 mg by mouth daily. [provider]  Taking Active Self  denosumab (PROLIA) 60 MG/ML SOSY injection [id number] Yes Inject 60 mg into the skin every 6 (six) months. [provider] Taking Active   ezetimibe (ZETIA) 10 MG tablet [id number] Yes Take 1 tablet (10 mg total) by mouth daily. [name], NP Taking Active   ibuprofen (ADVIL,MOTRIN) 200 MG tablet [id number] Yes Take 400 mg by mouth daily. [provider] Taking Active   lisinopril (ZESTRIL) 20 MG tablet [id number] Yes Take 2 tablets (40 mg total) by mouth daily.  Patient taking differently: Take 20 mg by mouth daily.   [name], NP Taking Active            Med Note Barbaraann Boys Oct 30, 2021  9:13 AM) Patient did not realize that she was to take 2 tablets - she will start taking 2 tablets today 10/30/2021.   Magnesium 400 MG TABS [id number] Yes Take 400 mg by mouth daily. [provider] Taking Active   metoprolol succinate (TOPROL-XL) 50 MG 24 hr tablet [id number] Yes Take 1&1/2 tablets (75 mg total) by mouth daily. Take with or immediately following a meal. [name], NP Taking Active   Multiple Vitamin (MULTIVITAMIN) tablet [id number] Yes Take 1 tablet by mouth daily. [provider] Taking Active Self  niacin 500 MG tablet [id number] Yes Take 500 mg by mouth at bedtime. [provider] Taking Active Self           Med Note De Blanch   Fri Apr 29, 2020  9:20 AM)    Omega-3 Fatty Acids (FISH OIL) 1000 MG CAPS [id number] Yes Take 1 capsule by mouth every morning. [provider] Taking Active Self  omeprazole (PRILOSEC) 40 MG capsule [id number] Yes Take 1 capsule (40 mg total) by mouth daily. [name], NP Taking Active   potassium chloride (KLOR-CON M) 10 MEQ tablet [id number] Yes Take 1 tablet (10 mEq total) by mouth daily. [name], NP Taking Active             Patient Active Problem List   Diagnosis Date Noted   Prediabetes 10/12/2021   Vitamin D deficiency  10/12/2021   Leukocytosis 09/05/2021   Hyperalbuminemia 09/05/2021   Mixed hyperlipidemia 08/29/2021   Lumbar radiculopathy, chronic 08/25/2021   Preventative health care 12/14/2014  Hyponatremia 02/20/2014   Hypokalemia 02/20/2014   Hyperglycemia 11/17/2013   Need for prophylactic vaccination and inoculation against influenza 02/08/2012   HYPERCHOLESTEROLEMIA 04/01/2008   BARRETTS ESOPHAGUS 04/01/2008   OSTEOARTHRITIS 04/01/2008   URINARY INCONTINENCE 04/01/2008   TOBACCO ABUSE, HX OF 04/01/2008   GERD 09/24/2007   Essential hypertension 04/01/2007   Osteoporosis 05/31/2006   COLONIC POLYPS, HX OF 05/31/2006   NEPHROLITHIASIS, HX OF 05/31/2006    Immunization History  Administered Date(s) Administered   Fluad Quad(high Dose 65+) 02/25/2019, 02/01/2020, 02/06/2021   Influenza Split 04/18/2011, 02/08/2012   Influenza Whole 04/01/2007, 02/02/2008, 01/31/2009, 02/20/2010   Influenza, High Dose Seasonal PF 04/01/2018   Influenza,inj,Quad PF,6+ Mos 02/11/2014, 01/28/2015, 03/27/2016   Influenza-Unspecified 03/14/2013, 03/05/2017   PFIZER(Purple Top)SARS-COV-2 Vaccination 06/03/2019, 06/24/2019, 03/03/2020, 09/29/2020   Pfizer Covid-19 Vaccine Bivalent Booster 10yr & up 02/13/2021   Pneumococcal Conjugate-13 03/23/2015   Pneumococcal Polysaccharide-23 07/24/2006, 02/25/2019   Td 03/19/2002, 05/14/2006   Tdap 07/12/2020   Zoster Recombinat (Shingrix) 04/02/2017, 06/02/2017    Conditions to be addressed/monitored: HTN, HLD, and osteoporosis, GERD  Care Plan : General Pharmacy (Adult)  Updates made by ECherre Robins RPH-CPP since 10/30/2021 12:00 AM     Problem: HTN; HDL; osteoporosis; GERD   Note:   Current Barriers:  Unable to achieve control of hyperlipidemia due to statin intolerance  Unable to maintain control of hypertension - side effects to hydrochlorothiazide and misunderstanding of directions for lisinopril   Pharmacist Clinical Goal(s):  Over the next 180  days, patient will achieve control of hyperlipidmeia as evidenced by LDL <100 maintain control of hypertension as evidenced by BP <140/90  through collaboration with PharmD and provider.   Interventions: 1:1 collaboration with ODebbrah Alar NP regarding development and update of comprehensive plan of care as evidenced by provider attestation and co-signature Inter-disciplinary care team collaboration (see longitudinal plan of care) Comprehensive medication review performed; medication list updated in electronic medical record  Hypertension Not at goal; BP goal <140/90 Patient states she has not checked blood pressure in the last 2 weeks.  Denies dizziness Current regimen:  Amlodipine 145mdaily Lisinopril 4042maily (take 2 tablets of 48m98mily) Metoprolol succinate ER 50mg57mtake 1.5 tablets (=75mg)37mly with food. Hydrochlorothiazide stopped due to hyponatremia (sodium level improving since stopped hydrochlorothiazide) Patient did not realize she was to take lisinopril 48mg -37mablets daily and has only been taking 1 tablet daily Interventions: Discussed blood pressure  goal Discussed medications for blood pressure and recent changes. Will start taking lisinopril 48mg - 60mblets daily. Reocmmended patient check blood pressure every other day, document, and provide at future appointments (she will purchase new blood pressure monitor)  Ensure daily salt intake < 2300 mg/day  Hyperlipidemia Not at goal;  LDL goal < 100 or prevent further increase in LDL Current regimen:  Ezetimibe 10mg dai68mrill Oil 350mg dail78macin 500mg daily72mt medication tried: fenofibrate - caused carpopedal spasms; atorvastatin, simvastatin and rosuvastatin all caused myalgias Patient also had taken pravastatin 40mg in 20026mt was changed due to not "strong enough"; No notation of intolerance.  Has started walking 5 minutes per day and is planning to increase as able. Seeing weight management  clinic Interventions: Discussed diet and exercise Discussed LDL goal  Discussed retrying pravastatin since it appears she tolerated in past. Patient declined ANY statin due to past experience / myalgias Maintain cholesterol medication regimen. I did recommend she could stop CoEnzyme Q10 since she is not taking statin. Patient  would like to continue. Recheck lipids in 3 to 6 months   Osteoporosis (Goal prevention of fractures / stabalize bone minteral density) Goal: prevent fractures and falls  Current treatment  Prolia 3m subcutaneously every 6 months - started 11/01/2018 / last dose 08/22/2021 Calcium + D 600/405mdaily Vitamin D 5000 IU daily  Daily Multivitamin  Last DEXA Scan: 08/08/2020 Left Femur Total 08/08/2020 Normal -0.3  Left Femur Total 07/23/2018 Normal -0.5  Left Femur Total 02/01/2015 Normal -0.6  Right Forearm Radius 33% 08/08/2020 Osteoporosis -3.8  Right Forearm Radius 33% 07/23/2018 Osteoporosis -3.6   Medications previously tried: alendronate Next Prolia injection due around 02/21/2022 - has appointment Interventions: Recommend [phone] units of vitamin D daily.  Recommend 1200 mg of calcium daily from dietary and supplemental sources.  Recommend weight-bearing and muscle strengthening exercises for building and maintaining bone density as able Fall prevention discussed (addressed at previous visit)   Medication management [facility] Comprehensive medication review performed. Continue current medication management strategy) Reviewed recent medication refill history - patient is filling maintenance meds on time Identified low adherence to lisinopril due to patient was not taking as directed (misunderstood directions) - See above under hypertension.    Patient Goals/Self-Care Activities Over the next 180 days, patient will:  take medications as prescribed,  check blood pressure 2 to 3 times per week and  document, and provide at future appointments, and  Continue to walk daily 5 to 10 minutes and increase as able. Increase lisinopril to take 2032mablet - 2 tablets = 34m24mtal every day.  Continue to drink sugar free Sports drink for sodium / electrolytes (can also add food with sodium - carrots, celery, avocados or beets)   Follow Up Plan: Telephone follow up appointment with care management team member scheduled for:  4 to 5 months       Long-Range Goal: pharmacy related goals for chronic care and medication management   This Visit's Progress: On track  Priority: High       Medication Assistance: None required.  Patient affirms current coverage meets needs.  [facility]  [id number]: [phone]: [phone]'[place] -[id number]: [phone]: [phone] Up:  Patient agrees to Care Plan and Follow-up.  Plan: Telephone follow up appointment with care management team member scheduled for:  4 to 5 months  TammCherre RobinsarmD Clinical Pharmacist [place]-[phone]

## 2021-10-31 ENCOUNTER — Encounter (INDEPENDENT_AMBULATORY_CARE_PROVIDER_SITE_OTHER): Payer: Self-pay | Admitting: Family Medicine

## 2021-10-31 ENCOUNTER — Ambulatory Visit (INDEPENDENT_AMBULATORY_CARE_PROVIDER_SITE_OTHER): Payer: PPO | Admitting: Family Medicine

## 2021-10-31 ENCOUNTER — Other Ambulatory Visit (HOSPITAL_BASED_OUTPATIENT_CLINIC_OR_DEPARTMENT_OTHER): Payer: Self-pay

## 2021-10-31 VITALS — BP 135/72 | HR 66 | Temp 97.4°F | Ht 60.0 in | Wt 156.0 lb

## 2021-10-31 DIAGNOSIS — E669 Obesity, unspecified: Secondary | ICD-10-CM

## 2021-10-31 DIAGNOSIS — Z6830 Body mass index (BMI) 30.0-30.9, adult: Secondary | ICD-10-CM

## 2021-10-31 DIAGNOSIS — E871 Hypo-osmolality and hyponatremia: Secondary | ICD-10-CM | POA: Diagnosis not present

## 2021-11-06 ENCOUNTER — Ambulatory Visit (INDEPENDENT_AMBULATORY_CARE_PROVIDER_SITE_OTHER): Payer: PPO | Admitting: Family

## 2021-11-06 VITALS — BP 144/55 | HR 66 | Temp 98.1°F | Resp 16 | Wt 159.0 lb

## 2021-11-06 DIAGNOSIS — E871 Hypo-osmolality and hyponatremia: Secondary | ICD-10-CM | POA: Diagnosis not present

## 2021-11-06 LAB — BASIC METABOLIC PANEL
BUN: 12 mg/dL (ref 6–23)
CO2: 23 mEq/L (ref 19–32)
Calcium: 9.6 mg/dL (ref 8.4–10.5)
Chloride: 100 mEq/L (ref 96–112)
Creatinine, Ser: 0.71 mg/dL (ref 0.40–1.20)
GFR: 80.22 mL/min (ref >60.00)
Glucose, Bld: 101 mg/dL — ABNORMAL HIGH (ref 70–99)
Potassium: 5.2 mEq/L — ABNORMAL HIGH (ref 3.5–5.1)
Sodium: 132 mEq/L — ABNORMAL LOW (ref 135–145)

## 2021-11-07 ENCOUNTER — Telehealth: Payer: Self-pay | Admitting: Family

## 2021-11-07 ENCOUNTER — Other Ambulatory Visit (HOSPITAL_BASED_OUTPATIENT_CLINIC_OR_DEPARTMENT_OTHER): Payer: Self-pay

## 2021-11-07 MED ORDER — HYDRALAZINE HCL 25 MG PO TABS
25.0000 mg | ORAL_TABLET | Freq: Three times a day (TID) | ORAL | 1 refills | Status: DC
  Filled 2021-11-07: qty 90, 30d supply, fill #0

## 2021-11-07 NOTE — Telephone Encounter (Signed)
Please advise pt that her sodium is still a little low and potassium is a little high. I would like for her to stop lisinopril. Add hydralazine 25mg  three times daily. Follow up in 2 weeks with me please.

## 2021-11-07 NOTE — Telephone Encounter (Signed)
Patient advised of results and provider's comments. She was also advised to dc potassium per pcp. Appointment was scheduled for 11-21-21

## 2021-11-10 DIAGNOSIS — E785 Hyperlipidemia, unspecified: Secondary | ICD-10-CM | POA: Diagnosis not present

## 2021-11-10 DIAGNOSIS — M810 Age-related osteoporosis without current pathological fracture: Secondary | ICD-10-CM | POA: Diagnosis not present

## 2021-11-10 DIAGNOSIS — I10 Essential (primary) hypertension: Secondary | ICD-10-CM

## 2021-11-15 ENCOUNTER — Other Ambulatory Visit (HOSPITAL_BASED_OUTPATIENT_CLINIC_OR_DEPARTMENT_OTHER): Payer: Self-pay

## 2021-11-20 ENCOUNTER — Ambulatory Visit (INDEPENDENT_AMBULATORY_CARE_PROVIDER_SITE_OTHER): Payer: PPO | Admitting: Family Medicine

## 2021-11-21 ENCOUNTER — Ambulatory Visit: Payer: PPO | Admitting: Family

## 2021-11-22 ENCOUNTER — Ambulatory Visit (INDEPENDENT_AMBULATORY_CARE_PROVIDER_SITE_OTHER): Payer: PPO | Admitting: Family

## 2021-11-22 ENCOUNTER — Other Ambulatory Visit (HOSPITAL_BASED_OUTPATIENT_CLINIC_OR_DEPARTMENT_OTHER): Payer: Self-pay

## 2021-11-22 VITALS — BP 141/55 | HR 64 | Temp 98.1°F | Resp 16 | Wt 155.6 lb

## 2021-11-22 DIAGNOSIS — E871 Hypo-osmolality and hyponatremia: Secondary | ICD-10-CM | POA: Diagnosis not present

## 2021-11-22 DIAGNOSIS — I10 Essential (primary) hypertension: Secondary | ICD-10-CM

## 2021-11-22 DIAGNOSIS — E875 Hyperkalemia: Secondary | ICD-10-CM | POA: Diagnosis not present

## 2021-11-22 LAB — BASIC METABOLIC PANEL
BUN: 10 mg/dL (ref 6–23)
CO2: 23 mEq/L (ref 19–32)
Calcium: 8.8 mg/dL (ref 8.4–10.5)
Chloride: 102 mEq/L (ref 96–112)
Creatinine, Ser: 0.7 mg/dL (ref 0.40–1.20)
GFR: 81.57 mL/min (ref >60.00)
Glucose, Bld: 98 mg/dL (ref 70–99)
Potassium: 4.9 mEq/L (ref 3.5–5.1)
Sodium: 132 mEq/L — ABNORMAL LOW (ref 135–145)

## 2021-11-22 MED ORDER — HYDRALAZINE HCL 25 MG PO TABS
25.0000 mg | ORAL_TABLET | Freq: Three times a day (TID) | ORAL | 1 refills | Status: DC
  Filled 2021-11-22 – 2021-12-05 (×2): qty 270, 90d supply, fill #0
  Filled 2022-02-26: qty 270, 90d supply, fill #1

## 2021-11-22 NOTE — Assessment & Plan Note (Signed)
>>  ASSESSMENT AND PLAN FOR ESSENTIAL HYPERTENSION WRITTEN ON 11/22/2021  9:50 AM BY O'SULLIVAN, Marlene Beidler, NP  BP Readings from Last 3 Encounters:  11/22/21 (!) 141/55  11/06/21 (!) 144/55  10/31/21 135/72   BP is acceptable for her age.  Continue hydralazine and metoprolol.

## 2021-11-22 NOTE — Progress Notes (Signed)
Subjective:   By signing my name below, I, Vickey Sages, attest that this documentation has been prepared under the direction and in the presence of Sandford Craze, NP 11/22/2021    Patient ID: [name], female    DOB: 01-08-1941, 81 y.o.   MRN: [id number]  Chief Complaint  Patient presents with   Hyponatremia    Here for follow up    HPI Patient is in today for an office visit.  Medication update- She has stopped Lisinopril and started taking Hydralazine 25 mg since her last visit to manage her blood pressure. She has also stopped taking Potassium Chloride 10 meq.   Blood pressure- Her blood pressure is slightly elevated during this visit. She reports that her systolic blood pressure is occasionally at or below 140.She denies of any bilateral feet swelling.  BP Readings from Last 3 Encounters:  11/22/21 (!) 141/55  11/06/21 (!) 144/55  10/31/21 135/72   Pulse Readings from Last 3 Encounters:  11/22/21 64  11/06/21 66  10/31/21 66    Past Medical History:  Diagnosis Date   Arthritis    osteoarthritis   Back pain    Barrett's esophagus    Cataract    bilateral cateracts removed   Chronic kidney disease    kidney stones   Chronic obstructive bronchitis (HCC)    Dr Delford Field   Colitis    Colon polyps    GERD (gastroesophageal reflux disease)    History of chicken pox    History of nephrolithiasis    History of transfusion of packed red blood cells    Hypercholesteremia    Hyperlipidemia    Hypertension    IFG (impaired fasting glucose)    Osteoarthritis    Osteoporosis    pt cannot tolerate fosamax   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   Swelling of both lower extremities    Urinary incontinence     Past Surgical History:  Procedure Laterality Date   ABDOMINAL HYSTERECTOMY  1970   APPENDECTOMY     CHOLECYSTECTOMY     LEG SURGERY  2008   right   TOTAL KNEE ARTHROPLASTY  04/2006   total left  knee replacement    Family History  Problem Relation Age of Onset   Coronary artery disease Mother    Arthritis Mother    High blood pressure Mother    Heart disease Mother    High blood pressure Father    Heart disease Father    Asthma Maternal Grandmother    Tuberculosis Other    Colon cancer Neg Hx    Esophageal cancer Neg Hx    Rectal cancer Neg Hx    Stomach cancer Neg Hx     Social History   Socioeconomic History   Marital status: Widowed    Spouse name: Not on file   Number of children: Not on file   Years of education: Not on file   Highest education level: Not on file  Occupational History   Occupation: Retired  Tobacco Use   Smoking status: Former    Years: 15.00    Types: Cigarettes    Quit date: 05/15/1967    Years since quitting: 54.5   Smokeless tobacco: Never  Substance and Sexual Activity   Alcohol use: No   Drug use: No   Sexual activity: Never  Other Topics Concern   Not on file  Social History Narrative   Regular exercise:  yes   Social Determinants of Health   Financial Resource Strain: Low Risk  (05/23/2021)   Overall Financial Resource Strain (CARDIA)    Difficulty of Paying Living Expenses: Not hard at all  Food Insecurity: No Food Insecurity (09/14/2021)   Hunger Vital Sign    Worried About Running Out of Food in the Last Year: Never true    Ran Out of Food in the Last Year: Never true  Transportation Needs: No Transportation Needs (09/14/2021)   PRAPARE - Administrator, Civil Service (Medical): No    Lack of Transportation (Non-Medical): No  Physical Activity: Insufficiently Active (10/30/2021)   Exercise Vital Sign    Days of Exercise per Week: 4 days    Minutes of Exercise per Session: 10 min  Stress: No Stress Concern Present (09/14/2021)   [employer] of Occupational Health - Occupational Stress Questionnaire    Feeling of Stress : Not at all  Social Connections: Moderately Integrated (09/14/2021)   Social Connection  and Isolation Panel [NHANES]    Frequency of Communication with Friends and Family: More than three times a week    Frequency of Social Gatherings with Friends and Family: More than three times a week    Attends Religious Services: More than 4 times per year    Active Member of [employer] or Organizations: Yes    Attends Banker Meetings: More than 4 times per year    Marital Status: Widowed  Intimate Partner Violence: Not At Risk (09/14/2021)   Humiliation, Afraid, Rape, and Kick questionnaire    Fear of Current or Ex-Partner: No    Emotionally Abused: No    Physically Abused: No    Sexually Abused: No    Outpatient Medications Prior to Visit  Medication Sig Dispense Refill   amLODipine (NORVASC) 10 MG tablet TAKE 1 TABLET (10 MG TOTAL) BY MOUTH DAILY. 90 tablet 1   Ascorbic Acid (VITAMIN C) 1000 MG tablet Take 1,000 mg by mouth daily.     aspirin (ASPIRIN 81) 81 MG EC tablet Take 1 tablet (81 mg total) by mouth daily. Swallow whole. 30 tablet 12   Calcium Carb-Cholecalciferol (CALCIUM 600+D) 600-10 MG-MCG TABS Take 600 mg by mouth daily.     carboxymethylcellulose (REFRESH PLUS) 0.5 % SOLN Place 1 drop into both eyes 2 (two) times daily.     Cholecalciferol (VITAMIN D) 125 MCG (5000 UT) CAPS Take 1 capsule by mouth daily.     Coenzyme Q10 200 MG capsule Take 200 mg by mouth daily.     denosumab (PROLIA) 60 MG/ML SOSY injection Inject 60 mg into the skin every 6 (six) months.     ezetimibe (ZETIA) 10 MG tablet Take 1 tablet (10 mg total) by mouth daily. 90 tablet 1   ibuprofen (ADVIL,MOTRIN) 200 MG tablet Take 400 mg by mouth daily.     Magnesium 400 MG TABS Take 400 mg by mouth daily.     metoprolol succinate (TOPROL-XL) 50 MG 24 hr tablet Take 1&1/2 tablets (75 mg total) by mouth daily. Take with or immediately following a meal. 135 tablet 0   Multiple Vitamin (MULTIVITAMIN) tablet Take 1 tablet by mouth daily.     niacin 500 MG tablet Take 500 mg by mouth at bedtime.      Omega-3 Fatty Acids (FISH OIL) 1000 MG CAPS Take 1 capsule by mouth every morning.     omeprazole (PRILOSEC) 40 MG capsule Take 1 capsule (40 mg total) by mouth daily. 90  capsule 1   potassium chloride (KLOR-CON M) 10 MEQ tablet Take 1 tablet (10 mEq total) by mouth daily. 90 tablet 1   hydrALAZINE (APRESOLINE) 25 MG tablet Take 1 tablet (25 mg total) by mouth 3 (three) times daily. 90 tablet 1   No facility-administered medications prior to visit.    Allergies  Allergen Reactions   Atorvastatin Other (See Comments)    REACTION: MUSCLE PAINS   Diclofenac-Misoprostol Diarrhea and Nausea And Vomiting   Fenofibrate Other (See Comments)    REACTION: carpopedal spasms   Rosuvastatin Other (See Comments)    REACTION: MUSCLE PAINS   Statins Other (See Comments)    myalgias   Amoxicillin-Pot Clavulanate Other (See Comments)    REACTION: unspecified   Penicillins Hives   Sulfonamide Derivatives Hives   Tuberculin Ppd Other (See Comments)    Redness at sight    Review of Systems  Cardiovascular:  Negative for leg swelling.       Objective:    Physical Exam Constitutional:      General: She is not in acute distress.    Appearance: Normal appearance. She is not ill-appearing.  HENT:     Head: Normocephalic and atraumatic.     Right Ear: External ear normal.     Left Ear: External ear normal.  Eyes:     Extraocular Movements: Extraocular movements intact.     Pupils: Pupils are equal, round, and reactive to light.  Cardiovascular:     Rate and Rhythm: Normal rate and regular rhythm.     Pulses: Normal pulses.     Heart sounds: Normal heart sounds. No murmur heard.    No gallop.  Pulmonary:     Effort: Pulmonary effort is normal. No respiratory distress.     Breath sounds: Normal breath sounds. No wheezing or rales.  Skin:    General: Skin is warm and dry.  Neurological:     Mental Status: She is alert and oriented to person, place, and time.  Psychiatric:        Mood and  Affect: Mood normal.        Behavior: Behavior normal.        Judgment: Judgment normal.     BP (!) 141/55 (BP Location: Left Arm, Patient Position: Sitting, Cuff Size: Small)   Pulse 64   Temp 98.1 F (36.7 C) (Oral)   Resp 16   Wt 155 lb 9.6 oz (70.6 kg)   LMP 05/14/1968   SpO2 98%   BMI 30.39 kg/m  Wt Readings from Last 3 Encounters:  11/22/21 155 lb 9.6 oz (70.6 kg)  11/06/21 159 lb (72.1 kg)  10/31/21 156 lb (70.8 kg)       Assessment & Plan:   Problem List Items Addressed This Visit       Unprioritized   Hyponatremia - Primary    Will obtain follow up bmet to recheck sodium.        Relevant Orders   Basic metabolic panel   Hyperkalemia    Noted last visit. She has stopped kdur and lisinopril. Repeat today. Expect K+ to be improved with these changes.       Essential hypertension    BP Readings from Last 3 Encounters:  11/22/21 (!) 141/55  11/06/21 (!) 144/55  10/31/21 135/72  BP is acceptable for her age.  Continue hydralazine and metoprolol.       Relevant Medications   hydrALAZINE (APRESOLINE) 25 MG tablet    Meds ordered this encounter  Medications   hydrALAZINE (APRESOLINE) 25 MG tablet    Sig: Take 1 tablet (25 mg total) by mouth 3 (three) times daily.    Dispense:  270 tablet    Refill:  1    Order Specific Question:   Supervising Provider    Answer:   Danise Edge A [[id number]]    I, Lemont Fillers, NP, personally preformed the services described in this documentation.  All medical record entries made by the scribe were at my direction and in my presence.  I have reviewed the chart and discharge instructions (if applicable) and agree that the record reflects my personal performance and is accurate and complete. 11/22/2021.  I,Mohammed Iqbal,acting as a Neurosurgeon for [employer], NP.,have documented all relevant documentation on the behalf of Lemont Fillers, NP,as directed by  Lemont Fillers, NP while in the presence of  Lemont Fillers, NP.  Lemont Fillers, NP

## 2021-11-22 NOTE — Assessment & Plan Note (Signed)
Noted last visit. She has stopped kdur and lisinopril. Repeat today. Expect K+ to be improved with these changes.

## 2021-11-22 NOTE — Assessment & Plan Note (Signed)
BP Readings from Last 3 Encounters:  11/22/21 (!) 141/55  11/06/21 (!) 144/55  10/31/21 135/72   BP is acceptable for her age.  Continue hydralazine and metoprolol.

## 2021-11-22 NOTE — Assessment & Plan Note (Signed)
Will obtain follow up bmet to recheck sodium.

## 2021-11-28 ENCOUNTER — Encounter (INDEPENDENT_AMBULATORY_CARE_PROVIDER_SITE_OTHER): Payer: Self-pay | Admitting: Family Medicine

## 2021-11-28 ENCOUNTER — Ambulatory Visit (INDEPENDENT_AMBULATORY_CARE_PROVIDER_SITE_OTHER): Payer: PPO | Admitting: Family Medicine

## 2021-11-28 VITALS — BP 148/76 | HR 68 | Temp 98.0°F | Ht 60.0 in | Wt 150.0 lb

## 2021-11-28 DIAGNOSIS — E669 Obesity, unspecified: Secondary | ICD-10-CM

## 2021-11-28 DIAGNOSIS — I10 Essential (primary) hypertension: Secondary | ICD-10-CM | POA: Diagnosis not present

## 2021-11-28 DIAGNOSIS — Z6829 Body mass index (BMI) 29.0-29.9, adult: Secondary | ICD-10-CM

## 2021-11-28 DIAGNOSIS — R7303 Prediabetes: Secondary | ICD-10-CM

## 2021-11-28 DIAGNOSIS — E559 Vitamin D deficiency, unspecified: Secondary | ICD-10-CM

## 2021-12-03 NOTE — Progress Notes (Signed)
Chief Complaint:   OBESITY [name] is here to discuss her progress with her obesity treatment plan along with follow-up of her obesity related diagnoses. [name] is on the Category 1 Plan with breakfast options and states she is following her eating plan approximately 95% of the time. [name] states she is walking the treadmill 5 minutes 6-7 times per week.  Today's visit was #: 4 Starting weight: 170 lbs Starting date: 08/29/2021 Today's weight: 150 lbs Today's date: 11/28/2021 Total lbs lost to date: 20 lbs Total lbs lost since last in-office visit: 6 lbs  Interim History: [name] has been doing great.  She has a lot more energy and is back on the treadmill for 5-7 minutes daily. Her back pain is better.  [name] has no symptoms of dizziness, balance issues, or low energy lately.  Loves her meal plan.   Subjective:   1. Prediabetes [name] has a diagnosis of prediabetes based on her elevated HgA1c and was informed this puts her at greater risk of developing diabetes. She continues to work on diet and exercise to decrease her risk of diabetes. She denies nausea or hypoglycemia. [name] denies any hunger or cravings.  A1c was 5.8 three months ago.  2. Vitamin D deficiency She is currently taking OTC vitamin D 5,000 IU each day. She denies nausea, vomiting or muscle weakness.  3. Essential hypertension Review: taking medications as instructed, no medication side effects noted, no chest pain on exertion, no dyspnea on exertion, no swelling of ankles.  Blood pressures have been ranging from 140's/170's-80's.  Occasionally checks at home.  Blood pressure is stable and she is currently asymptomatic.  She denies any concerns today.   Assessment/Plan:  No orders of the defined types were placed in this encounter.   Medications Discontinued During This Encounter  Medication Reason   potassium chloride (KLOR-CON M) 10 MEQ tablet      No orders of the defined types were placed in this  encounter.    1. Prediabetes [name] will continue to work on weight loss, exercise, and decreasing simple carbohydrates to help decrease the risk of diabetes. No need for medications.  Continue prudent nutritional plan and weight loss.   2. Vitamin D deficiency Low Vitamin D level contributes to fatigue and are associated with obesity, breast, and colon cancer. She agrees to continue to take OTC Vitamin D @5 ,000 IU every day and will follow-up for routine testing of Vitamin D, at least 2-3 times per year to avoid over-replacement.  Vitamin D was at goal about 3 months ago.   3. Essential hypertension [name] is working on healthy weight loss and exercise to improve blood pressure control. We will watch for signs of hypotension as she continues her lifestyle modifications. Continue medications per PCP.  4. Obesity with current BMI of 29.5 [name] will increase walking as tolerated.  Seeing PCP in next few weeks for repeat labs.   [name] is currently in the action stage of change. As such, her goal is to continue with weight loss efforts. She has agreed to the Category 1 Plan with breakfast options.  Exercise goals:  As is, but increase as tolerated.  Behavioral modification strategies: increasing lean protein intake, decreasing simple carbohydrates, and planning for success.  [place] has agreed to follow-up with our clinic in 4 weeks. She was informed of the importance of frequent follow-up visits to maximize her success with intensive lifestyle modifications for her multiple health conditions.   Objective:   Blood pressure [name])  148/76, pulse 68, temperature 98 F (36.7 C), height 5' (1.524 m), weight 150 lb (68 kg), last menstrual period 05/14/1968, SpO2 97 %. Body mass index is 29.29 kg/m.  General: Cooperative, alert, well developed, in no acute distress. HEENT: Conjunctivae and lids unremarkable. Cardiovascular: Regular rhythm.  Lungs: Normal work of breathing. Neurologic: No focal  deficits.   Lab Results  Component Value Date   CREATININE 0.70 11/22/2021   BUN 10 11/22/2021   NA 132 (L) 11/22/2021   K 4.9 11/22/2021   CL 102 11/22/2021   CO2 23 11/22/2021   Lab Results  Component Value Date   ALT 27 09/05/2021   AST 19 09/05/2021   ALKPHOS 58 09/05/2021   BILITOT 0.4 09/05/2021   Lab Results  Component Value Date   HGBA1C 5.8 (H) 08/29/2021   HGBA1C 5.8 02/06/2021   HGBA1C 5.6 07/21/2019   HGBA1C 5.5 01/17/2018   HGBA1C 5.5 07/12/2017   Lab Results  Component Value Date   INSULIN 10.0 08/29/2021   Lab Results  Component Value Date   TSH 3.530 08/29/2021   Lab Results  Component Value Date   CHOL 273 (H) 08/29/2021   HDL 75 08/29/2021   LDLCALC 168 (H) 08/29/2021   LDLDIRECT 189.0 02/06/2021   TRIG 165 (H) 08/29/2021   CHOLHDL 5 02/06/2021   Lab Results  Component Value Date   VD25OH 59.5 08/29/2021   VD25OH 49.28 08/01/2020   VD25OH 48 08/27/2012   Lab Results  Component Value Date   WBC 12.3 (H) 09/05/2021   HGB 13.0 09/05/2021   HCT 38.9 09/05/2021   MCV 89.6 09/05/2021   PLT 384.0 09/05/2021   No results found for: "IRON", "TIBC", "FERRITIN"  Obesity Behavioral Intervention:   Approximately 15 minutes were spent on the discussion below.  ASK: We discussed the diagnosis of obesity with [name] today and [name] agreed to give [place] permission to discuss obesity behavioral modification therapy today.  ASSESS: [name] has the diagnosis of obesity and her BMI today is 29.5. [name] is in the action stage of change.   ADVISE: [name] was educated on the multiple health risks of obesity as well as the benefit of weight loss to improve her health. She was advised of the need for long term treatment and the importance of lifestyle modifications to improve her current health and to decrease her risk of future health problems.  AGREE: Multiple dietary modification options and treatment options were discussed and [name] agreed to follow  the recommendations documented in the above note.  ARRANGE: [name] was educated on the importance of frequent visits to treat obesity as outlined per CMS and USPSTF guidelines and agreed to schedule her next follow up appointment today.  Attestation Statements:   Reviewed by clinician on day of visit: allergies, medications, problem list, medical history, surgical history, family history, social history, and previous encounter notes.  I, Malcolm Metro, RMA, am acting as Energy manager for [employer], DO.   I have reviewed the above documentation for accuracy and completeness, and I agree with the above. [name], D.O.  The 21st Century Cures Act was signed into law in 2016 which includes the topic of electronic health records.  This provides immediate access to information in MyChart.  This includes consultation notes, operative notes, office notes, lab results and pathology reports.  If you have any questions about what you read please let us know at your next visit so we can discuss your concerns and take corrective action if  need be.  We are right here with you.

## 2021-12-04 ENCOUNTER — Ambulatory Visit: Payer: PPO | Admitting: Family

## 2021-12-05 ENCOUNTER — Other Ambulatory Visit (HOSPITAL_BASED_OUTPATIENT_CLINIC_OR_DEPARTMENT_OTHER): Payer: Self-pay

## 2021-12-05 ENCOUNTER — Ambulatory Visit (INDEPENDENT_AMBULATORY_CARE_PROVIDER_SITE_OTHER): Payer: PPO | Admitting: Family Medicine

## 2021-12-20 ENCOUNTER — Encounter (INDEPENDENT_AMBULATORY_CARE_PROVIDER_SITE_OTHER): Payer: Self-pay

## 2021-12-25 ENCOUNTER — Other Ambulatory Visit: Payer: Self-pay | Admitting: Family

## 2021-12-25 ENCOUNTER — Other Ambulatory Visit (HOSPITAL_BASED_OUTPATIENT_CLINIC_OR_DEPARTMENT_OTHER): Payer: Self-pay

## 2021-12-25 MED ORDER — EZETIMIBE 10 MG PO TABS
10.0000 mg | ORAL_TABLET | Freq: Every day | ORAL | 0 refills | Status: DC
  Filled 2021-12-25: qty 90, 90d supply, fill #0

## 2021-12-27 ENCOUNTER — Encounter (INDEPENDENT_AMBULATORY_CARE_PROVIDER_SITE_OTHER): Payer: Self-pay | Admitting: Family Medicine

## 2021-12-27 ENCOUNTER — Ambulatory Visit (INDEPENDENT_AMBULATORY_CARE_PROVIDER_SITE_OTHER): Payer: PPO | Admitting: Family Medicine

## 2021-12-27 VITALS — BP 135/72 | HR 65 | Temp 97.8°F | Ht 60.0 in | Wt 145.0 lb

## 2021-12-27 DIAGNOSIS — E669 Obesity, unspecified: Secondary | ICD-10-CM | POA: Diagnosis not present

## 2021-12-27 DIAGNOSIS — E559 Vitamin D deficiency, unspecified: Secondary | ICD-10-CM

## 2021-12-27 DIAGNOSIS — M1990 Unspecified osteoarthritis, unspecified site: Secondary | ICD-10-CM

## 2021-12-27 DIAGNOSIS — Z6828 Body mass index (BMI) 28.0-28.9, adult: Secondary | ICD-10-CM

## 2021-12-27 DIAGNOSIS — E7849 Other hyperlipidemia: Secondary | ICD-10-CM

## 2021-12-27 DIAGNOSIS — Z6833 Body mass index (BMI) 33.0-33.9, adult: Secondary | ICD-10-CM

## 2022-01-02 NOTE — Progress Notes (Signed)
Chief Complaint:   OBESITY [name] is here to discuss her progress with her obesity treatment plan along with follow-up of her obesity related diagnoses. [name] is on the Category 1 Plan with breakfast options and states she is following her eating plan approximately 95% of the time. [name] states she is not currently exercising.  Today's visit was #: 5 Starting weight: 170 lbs Starting date: 08/29/2021 Today's weight: 145 lbs Today's date: 12/27/2021 Total lbs lost to date: 25 Total lbs lost since last in-office visit: 5  Interim History: [name] is only walking 10 minutes a day. Her back has been flaring up a bit. She reports the meal plan is going well. She denies hunger if she eats her protein and has cravings occasionally for salt, at which time she will have a few pretzels.  Subjective:   1. Other type of osteoarthritis, unspecified site Pt is on Prolia and OTC Vit D 5,000 IU daily.  2. Vitamin D deficiency She takes OTC Vit D 5000 IU daily.  3. Other hyperlipidemia [name] is unable to tolerate statins and LDL is not at goal.  Assessment/Plan:  No orders of the defined types were placed in this encounter.   There are no discontinued medications.   No orders of the defined types were placed in this encounter.    1. Other type of osteoarthritis, unspecified site We will continue to monitor. Orders and follow up as documented in patient record. Continue weight bearing exercises, Prolia, and OTC Vit D 5,000 IU daily.  Counseling Osteoporosis happens when your bones get thin and weak. This can cause your bones to break (fracture) more easily.  Exercise is very important to keep bones strong. Focus on strength training (lifting weights) and exercises that make your muscles work to hold your body weight up (weight-bearing exercises). These include tai chi, yoga, and walking.  Limit alcohol intake to no more than 1 drink a day for nonpregnant women and 2 drinks a day for  men. One drink equals 12 oz of beer, 5 oz of wine, or 1 oz of hard liquor. Do not use any products that have nicotine or tobacco in them.  Preventing falls Use tools to help you move around (mobility aids) as needed. These include canes, walkers, scooters, and crutches. Keep rooms well-lit and free of clutter. Wear shoes that fit you well and support your feet. Eat plenty of calcium and Vitamin D as these nutrients are good for your bones.   2. Vitamin D deficiency Low Vitamin D level contributes to fatigue and are associated with obesity, breast, and colon cancer. She agrees to continue OTC Vitamin D 5,000 IU daily and will follow-up for routine testing of Vitamin D, at least 2-3 times per year to avoid over-replacement.  3. Other hyperlipidemia Cardiovascular risk and specific lipid/LDL goals reviewed.  We discussed several lifestyle modifications today and [name] will continue to work on diet, exercise and weight loss efforts. Orders and follow up as documented in patient record.  Handouts: Red Yeast Rice; PCSK9 Inhibitors Pt may want to ask PCP about these meds.  Counseling Intensive lifestyle modifications are the first line treatment for this issue. Dietary changes: Increase soluble fiber. Decrease simple carbohydrates. Exercise changes: Moderate to vigorous-intensity aerobic activity 150 minutes per week if tolerated. Lipid-lowering medications: see documented in medical record.  4. Obesity with current BMI of 28.4 [name] is currently in the action stage of change. As such, her goal is to continue with weight loss  efforts. She has agreed to the Category 1 Plan with breakfast options.   Goal BMI 25-28.  Exercise goals: Older adults should follow the adult guidelines. When older adults cannot meet the adult guidelines, they should be as physically active as their abilities and conditions will allow.   Behavioral modification strategies: planning for success.  [name] has agreed to  follow-up with our clinic in 3-4 weeks. She was informed of the importance of frequent follow-up visits to maximize her success with intensive lifestyle modifications for her multiple health conditions.   Objective:   Blood pressure 135/72, pulse 65, temperature 97.8 F (36.6 C), height 5' (1.524 m), weight 145 lb (65.8 kg), last menstrual period 05/14/1968, SpO2 96 %. Body mass index is 28.32 kg/m.  General: Cooperative, alert, well developed, in no acute distress. HEENT: Conjunctivae and lids unremarkable. Cardiovascular: Regular rhythm.  Lungs: Normal work of breathing. Neurologic: No focal deficits.   Lab Results  Component Value Date   CREATININE 0.70 11/22/2021   BUN 10 11/22/2021   NA 132 (L) 11/22/2021   K 4.9 11/22/2021   CL 102 11/22/2021   CO2 23 11/22/2021   Lab Results  Component Value Date   ALT 27 09/05/2021   AST 19 09/05/2021   ALKPHOS 58 09/05/2021   BILITOT 0.4 09/05/2021   Lab Results  Component Value Date   HGBA1C 5.8 (H) 08/29/2021   HGBA1C 5.8 02/06/2021   HGBA1C 5.6 07/21/2019   HGBA1C 5.5 01/17/2018   HGBA1C 5.5 07/12/2017   Lab Results  Component Value Date   INSULIN 10.0 08/29/2021   Lab Results  Component Value Date   TSH 3.530 08/29/2021   Lab Results  Component Value Date   CHOL 273 (H) 08/29/2021   HDL 75 08/29/2021   LDLCALC 168 (H) 08/29/2021   LDLDIRECT 189.0 02/06/2021   TRIG 165 (H) 08/29/2021   CHOLHDL 5 02/06/2021   Lab Results  Component Value Date   VD25OH 59.5 08/29/2021   VD25OH 49.28 08/01/2020   VD25OH 48 08/27/2012   Lab Results  Component Value Date   WBC 12.3 (H) 09/05/2021   HGB 13.0 09/05/2021   HCT 38.9 09/05/2021   MCV 89.6 09/05/2021   PLT 384.0 09/05/2021   No results found for: "IRON", "TIBC", "FERRITIN"  Obesity Behavioral Intervention:   Approximately 15 minutes were spent on the discussion below.  ASK: We discussed the diagnosis of obesity with [name] today and [name] agreed to give  [name] permission to discuss obesity behavioral modification therapy today.  ASSESS: [name] has the diagnosis of obesity and her BMI today is 28.4. [name] is in the action stage of change.   ADVISE: [name] was educated on the multiple health risks of obesity as well as the benefit of weight loss to improve her health. She was advised of the need for long term treatment and the importance of lifestyle modifications to improve her current health and to decrease her risk of future health problems.  AGREE: Multiple dietary modification options and treatment options were discussed and [name] agreed to follow the recommendations documented in the above note.  ARRANGE: [name] was educated on the importance of frequent visits to treat obesity as outlined per CMS and USPSTF guidelines and agreed to schedule her next follow up appointment today.  Attestation Statements:   Reviewed by clinician on day of visit: allergies, medications, problem list, medical history, surgical history, family history, social history, and previous encounter notes.  I, Kathlene November, BS, CMA, am acting as  transcriptionist for [employer], DO.   I have reviewed the above documentation for accuracy and completeness, and I agree with the above. Marjory Sneddon, D.O.  The [place] was signed into law in 2016 which includes the topic of electronic health records.  This provides immediate access to information in MyChart.  This includes consultation notes, operative notes, office notes, lab results and pathology reports.  If you have any questions about what you read please let [name] know at your next visit so we can discuss your concerns and take corrective action if need be.  We are right here with you.

## 2022-01-11 ENCOUNTER — Other Ambulatory Visit: Payer: Self-pay | Admitting: Family

## 2022-01-11 ENCOUNTER — Other Ambulatory Visit (HOSPITAL_BASED_OUTPATIENT_CLINIC_OR_DEPARTMENT_OTHER): Payer: Self-pay

## 2022-01-11 MED ORDER — METOPROLOL SUCCINATE ER 50 MG PO TB24
75.0000 mg | ORAL_TABLET | Freq: Every day | ORAL | 1 refills | Status: DC
  Filled 2022-01-11: qty 135, 90d supply, fill #0
  Filled 2022-04-11: qty 135, 90d supply, fill #1

## 2022-01-17 ENCOUNTER — Encounter (INDEPENDENT_AMBULATORY_CARE_PROVIDER_SITE_OTHER): Payer: Self-pay | Admitting: Family Medicine

## 2022-01-17 ENCOUNTER — Other Ambulatory Visit (HOSPITAL_BASED_OUTPATIENT_CLINIC_OR_DEPARTMENT_OTHER): Payer: Self-pay

## 2022-01-17 ENCOUNTER — Ambulatory Visit (INDEPENDENT_AMBULATORY_CARE_PROVIDER_SITE_OTHER): Payer: PPO | Admitting: Family Medicine

## 2022-01-17 VITALS — BP 128/74 | HR 58 | Temp 97.8°F | Ht 60.0 in | Wt 141.0 lb

## 2022-01-17 DIAGNOSIS — E669 Obesity, unspecified: Secondary | ICD-10-CM

## 2022-01-17 DIAGNOSIS — Z6827 Body mass index (BMI) 27.0-27.9, adult: Secondary | ICD-10-CM | POA: Diagnosis not present

## 2022-01-17 DIAGNOSIS — I10 Essential (primary) hypertension: Secondary | ICD-10-CM | POA: Diagnosis not present

## 2022-01-20 NOTE — Telephone Encounter (Signed)
Prolia VOB initiated via [email or web address] 

## 2022-01-24 ENCOUNTER — Ambulatory Visit (INDEPENDENT_AMBULATORY_CARE_PROVIDER_SITE_OTHER): Payer: PPO | Admitting: Family Medicine

## 2022-01-24 NOTE — Progress Notes (Signed)
Chief Complaint:   OBESITY [name] is here to discuss her progress with her obesity treatment plan along with follow-up of her obesity related diagnoses. [name] is on the Category 1 Plan with breakfast options and states she is following her eating plan approximately 95% of the time. [name] states she is doing chair exercises 30 minutes 3 times per week.  Today's visit was #: 6 Starting weight: 170 lbs Starting date: 08/29/2021 Today's weight: 141 lbs Today's date: 01/17/2022 Total lbs lost to date: 29 Total lbs lost since last in-office visit: 4  Interim History: [name] denies issues with meal plan. "Feeling great!" She started back with some resistance training in her resistance chair. Pt desires to weigh a "little bit over 140 lbs."  Subjective:   1. Essential hypertension Pt is asymptomatic. She denies dizziness or new concerns.  Assessment/Plan:  No orders of the defined types were placed in this encounter.   There are no discontinued medications.   No orders of the defined types were placed in this encounter.    1. Essential hypertension BP is finally at goal! [name] is working on healthy weight loss and exercise to improve blood pressure control. We will watch for signs of hypotension as she continues her lifestyle modifications. Continue meds per PCP.  2. Obesity with current BMI of 27.6 [name] is currently in the action stage of change. As such, her goal is to continue with weight loss efforts. She has agreed to the Category 1 Plan with breakfast options.   Start [name] walk at home [name] [place] 2 days a week.  Exercise goals:  As is and add cardio 2 days a week.  Behavioral modification strategies: better snacking choices (fruits) and planning for success.  [name] has agreed to follow-up with our clinic in 4 weeks. She was informed of the importance of frequent follow-up visits to maximize her success with intensive lifestyle modifications for her  multiple health conditions.   Objective:   Blood pressure 128/74, pulse (!) 58, temperature 97.8 F (36.6 C), height 5' (1.524 m), weight 141 lb (64 kg), last menstrual period 05/14/1968, SpO2 96 %. Body mass index is 27.54 kg/m.  General: Cooperative, alert, well developed, in no acute distress. HEENT: Conjunctivae and lids unremarkable. Cardiovascular: Regular rhythm.  Lungs: Normal work of breathing. Neurologic: No focal deficits.   Lab Results  Component Value Date   CREATININE 0.70 11/22/2021   BUN 10 11/22/2021   NA 132 (L) 11/22/2021   K 4.9 11/22/2021   CL 102 11/22/2021   CO2 23 11/22/2021   Lab Results  Component Value Date   ALT 27 09/05/2021   AST 19 09/05/2021   ALKPHOS 58 09/05/2021   BILITOT 0.4 09/05/2021   Lab Results  Component Value Date   HGBA1C 5.8 (H) 08/29/2021   HGBA1C 5.8 02/06/2021   HGBA1C 5.6 07/21/2019   HGBA1C 5.5 01/17/2018   HGBA1C 5.5 07/12/2017   Lab Results  Component Value Date   INSULIN 10.0 08/29/2021   Lab Results  Component Value Date   TSH 3.530 08/29/2021   Lab Results  Component Value Date   CHOL 273 (H) 08/29/2021   HDL 75 08/29/2021   LDLCALC 168 (H) 08/29/2021   LDLDIRECT 189.0 02/06/2021   TRIG 165 (H) 08/29/2021   CHOLHDL 5 02/06/2021   Lab Results  Component Value Date   VD25OH 59.5 08/29/2021   VD25OH 49.28 08/01/2020   VD25OH 48 08/27/2012   Lab Results  Component Value  Date   WBC 12.3 (H) 09/05/2021   HGB 13.0 09/05/2021   HCT 38.9 09/05/2021   MCV 89.6 09/05/2021   PLT 384.0 09/05/2021    Attestation Statements:   Reviewed by clinician on day of visit: allergies, medications, problem list, medical history, surgical history, family history, social history, and previous encounter notes.  Time spent on visit including pre-visit chart review and post-visit care and charting was 20 minutes.   I, [name], BS, CMA, am acting as transcriptionist for [employer], DO.  I have reviewed  the above documentation for accuracy and completeness, and I agree with the above. [name], D.O.  The 21st Century Cures Act was signed into law in 2016 which includes the topic of electronic health records.  This provides immediate access to information in MyChart.  This includes consultation notes, operative notes, office notes, lab results and pathology reports.  If you have any questions about what you read please let us know at your next visit so we can discuss your concerns and take corrective action if need be.  We are right here with you.

## 2022-01-31 NOTE — Telephone Encounter (Signed)
Pt ready for scheduling on or after 02/22/22   Out-of-pocket cost due at time of visit: $276   Primary: [employer] Prolia co-insurance: 20% (approximately $276) Admin fee co-insurance: 0%    Secondary: n/a Prolia co-insurance:  Admin fee co-insurance:    Deductible: does not apply   Prior Auth: not required PA# Valid:    ** This summary of benefits is an estimation of the patient's out-of-pocket cost. Exact cost may vary based on individual plan coverage.

## 2022-02-01 NOTE — Telephone Encounter (Signed)
My Chart message sent

## 2022-02-13 ENCOUNTER — Other Ambulatory Visit (HOSPITAL_BASED_OUTPATIENT_CLINIC_OR_DEPARTMENT_OTHER): Payer: Self-pay

## 2022-02-13 ENCOUNTER — Other Ambulatory Visit: Payer: Self-pay | Admitting: Family

## 2022-02-13 MED ORDER — OMEPRAZOLE 40 MG PO CPDR
40.0000 mg | DELAYED_RELEASE_CAPSULE | Freq: Every day | ORAL | 1 refills | Status: DC
  Filled 2022-02-13: qty 90, 90d supply, fill #0
  Filled 2022-05-15: qty 90, 90d supply, fill #1

## 2022-02-14 ENCOUNTER — Encounter (INDEPENDENT_AMBULATORY_CARE_PROVIDER_SITE_OTHER): Payer: Self-pay | Admitting: Family Medicine

## 2022-02-14 ENCOUNTER — Ambulatory Visit (INDEPENDENT_AMBULATORY_CARE_PROVIDER_SITE_OTHER): Payer: PPO | Admitting: Family Medicine

## 2022-02-14 VITALS — BP 113/72 | HR 65 | Temp 98.3°F | Ht 60.0 in | Wt 137.0 lb

## 2022-02-14 DIAGNOSIS — Z6826 Body mass index (BMI) 26.0-26.9, adult: Secondary | ICD-10-CM

## 2022-02-14 DIAGNOSIS — E559 Vitamin D deficiency, unspecified: Secondary | ICD-10-CM | POA: Diagnosis not present

## 2022-02-14 DIAGNOSIS — E669 Obesity, unspecified: Secondary | ICD-10-CM

## 2022-02-21 ENCOUNTER — Ambulatory Visit (INDEPENDENT_AMBULATORY_CARE_PROVIDER_SITE_OTHER): Payer: PPO | Admitting: *Deleted

## 2022-02-21 ENCOUNTER — Telehealth: Payer: Self-pay | Admitting: *Deleted

## 2022-02-21 DIAGNOSIS — M810 Age-related osteoporosis without current pathological fracture: Secondary | ICD-10-CM

## 2022-02-21 MED ORDER — DENOSUMAB 60 MG/ML ~~LOC~~ SOSY
60.0000 mg | PREFILLED_SYRINGE | Freq: Once | SUBCUTANEOUS | Status: AC
  Administered 2022-02-21: 60 mg via SUBCUTANEOUS

## 2022-02-21 NOTE — Telephone Encounter (Signed)
Kem Boroughs D, CMA 6 hours ago (10:12 AM)    Prolia given today

## 2022-02-21 NOTE — Progress Notes (Signed)
Patient here for prolia injection per physicians orders.  Injection given left subq and patient tolerated well.  

## 2022-02-21 NOTE — Telephone Encounter (Signed)
Prolia given today. 

## 2022-02-24 NOTE — Progress Notes (Unsigned)
Chief Complaint:   OBESITY [name] is here to discuss her progress with her obesity treatment plan along with follow-up of her obesity related diagnoses. [name] is on the Category 1 Plan with breakfast options and states she is following her eating plan approximately 90% of the time. [name] states she is doing resistance chair exercises 30 minutes 3 times per week.  Today's visit was #: 7 Starting weight: 170 lbs Starting date: 08/29/2021 Today's weight: 137 lbs Today's date: 02/14/2022 Total lbs lost to date: 33 Total lbs lost since last in-office visit: 4  Interim History: [name] had vacation and 2 birthday parties. She was very careful. [place] the [place]. She started her resistance chair at home. Pt also started 10 minutes of walking with her walker down her street each day.  Subjective:   1. Vitamin D deficiency [name]'s Vit D level was 59.3 in April 2023. Medication: OTC Vit D 5,000 IU.  Assessment/Plan:  No orders of the defined types were placed in this encounter.   There are no discontinued medications.   No orders of the defined types were placed in this encounter.    1. Vitamin D deficiency Vit D level at goal when last checked. We will consider rechecking lab in the near future, if not done by PCP. Continue Prolia. - I again reiterated the importance of vitamin D (as well as calcium) to their health and wellbeing.  - I reviewed possible symptoms of low Vitamin D:  low energy, depressed mood, muscle aches, joint aches, osteoporosis etc. - low Vitamin D levels may be linked to an increased risk of cardiovascular events and even increased risk of cancers- such as colon and breast.  - ideal vitamin D levels reviewed with patient  - Informed patient this may be a lifelong thing, and she was encouraged to continue to take the medicine until told otherwise.    - weight loss will likely improve availability of vitamin D, thus encouraged [name] to continue  with meal plan and their weight loss efforts to further improve this condition.  Thus, we will need to monitor levels regularly (every 3-4 mo on average) to keep levels within normal limits and prevent over supplementation. - pt's questions and concerns regarding this condition addressed.  2. Obesity with current BMI of 26.9 [name] is currently in the action stage of change. As such, her goal is to continue with weight loss efforts. She has agreed to change to the Category 2 Plan.   Exercise goals: I recommend pt continue walking with her walker. Older adults should follow the adult guidelines. When older adults cannot meet the adult guidelines, they should be as physically active as their abilities and conditions will allow.   Behavioral modification strategies: increasing lean protein intake, decreasing simple carbohydrates, keeping healthy foods in the home, and planning for success.  [name] has agreed to follow-up with our clinic in 4 weeks. She was informed of the importance of frequent follow-up visits to maximize her success with intensive lifestyle modifications for her multiple health conditions.   Objective:   Blood pressure 113/72, pulse 65, temperature 98.3 F (36.8 C), height 5' (1.524 m), weight 137 lb (62.1 kg), last menstrual period 05/14/1968, SpO2 95 %. Body mass index is 26.76 kg/m.  General: Cooperative, alert, well developed, in no acute distress. HEENT: Conjunctivae and lids unremarkable. Cardiovascular: Regular rhythm.  Lungs: Normal work of breathing. Neurologic: No focal deficits.   Lab Results  Component Value  Date   CREATININE 0.70 11/22/2021   BUN 10 11/22/2021   NA 132 (L) 11/22/2021   K 4.9 11/22/2021   CL 102 11/22/2021   CO2 23 11/22/2021   Lab Results  Component Value Date   ALT 27 09/05/2021   AST 19 09/05/2021   ALKPHOS 58 09/05/2021   BILITOT 0.4 09/05/2021   Lab Results  Component Value Date   HGBA1C 5.8 (H) 08/29/2021   HGBA1C 5.8  02/06/2021   HGBA1C 5.6 07/21/2019   HGBA1C 5.5 01/17/2018   HGBA1C 5.5 07/12/2017   Lab Results  Component Value Date   INSULIN 10.0 08/29/2021   Lab Results  Component Value Date   TSH 3.530 08/29/2021   Lab Results  Component Value Date   CHOL 273 (H) 08/29/2021   HDL 75 08/29/2021   LDLCALC 168 (H) 08/29/2021   LDLDIRECT 189.0 02/06/2021   TRIG 165 (H) 08/29/2021   CHOLHDL 5 02/06/2021   Lab Results  Component Value Date   VD25OH 59.5 08/29/2021   VD25OH 49.28 08/01/2020   VD25OH 48 08/27/2012   Lab Results  Component Value Date   WBC 12.3 (H) 09/05/2021   HGB 13.0 09/05/2021   HCT 38.9 09/05/2021   MCV 89.6 09/05/2021   PLT 384.0 09/05/2021    Attestation Statements:   Reviewed by clinician on day of visit: allergies, medications, problem list, medical history, surgical history, family history, social history, and previous encounter notes.  Time spent on visit including pre-visit chart review and post-visit care and charting was 20 minutes.   I, Kathlene November, BS, CMA, am acting as transcriptionist for [employer], DO.   I have reviewed the above documentation for accuracy and completeness, and I agree with the above. Marjory Sneddon, D.O.  The [place] was signed into law in 2016 which includes the topic of electronic health records.  This provides immediate access to information in MyChart.  This includes consultation notes, operative notes, office notes, lab results and pathology reports.  If you have any questions about what you read please let us know at your next visit so we can discuss your concerns and take corrective action if need be.  We are right here with you.

## 2022-02-26 ENCOUNTER — Ambulatory Visit: Payer: PPO | Admitting: Pharmacist

## 2022-02-26 ENCOUNTER — Other Ambulatory Visit (HOSPITAL_BASED_OUTPATIENT_CLINIC_OR_DEPARTMENT_OTHER): Payer: Self-pay

## 2022-02-26 DIAGNOSIS — E782 Mixed hyperlipidemia: Secondary | ICD-10-CM

## 2022-02-26 DIAGNOSIS — R7303 Prediabetes: Secondary | ICD-10-CM

## 2022-02-26 DIAGNOSIS — M810 Age-related osteoporosis without current pathological fracture: Secondary | ICD-10-CM

## 2022-02-26 DIAGNOSIS — E871 Hypo-osmolality and hyponatremia: Secondary | ICD-10-CM

## 2022-02-26 DIAGNOSIS — I10 Essential (primary) hypertension: Secondary | ICD-10-CM

## 2022-02-26 NOTE — Progress Notes (Signed)
Pharmacy Note  02/26/2022 Name: [name] MRN: [id number] DOB: 1941-03-15  Subjective: [name] is a 81 y.o. year old female who is a primary care patient of Sandford Craze, NP. Clinical Pharmacist Practitioner referral was placed to assist with medication management.    Engaged with patient by telephone for follow up visit today.  Hyperlipidemia:  Patient is taking ezetimibe 10mg  daily. She has tried atorvastatin and rosuvastatin in past but was not able to tolerate due to muscle pain. She also tried fenofibrate but stopped due to carpopedal spasms. With pre diabetes diagnosis would like to see LDL < 100. Patient has changed diet significantly over the last 6 months and has lost about 30lbs.  Hypertension: blood pressure this morning was 120/71. Patient is taking amlodipine 10mg  daily, hydralazine 25mg  3 times a day and metoprolol Er 50mg  - 1.5 tabs = 75mg  daily. She tried lisinopril 40mg  but potasium increased so was stopped 11/07/2021.  Hyponatremia - last serum sodium was 132 - still low but stable.  Pre - Diabetes: Last A1c 5.8% (08/29/2021). Following low sugar and low fat diet. Has lost about 30 lbs.  Osteoporosis: Received Prolia 02/21/2022. Taking calcium and vitamin D supplementation. Getting weight bearing and resistance exercise regularly - walking 10 minutes daily and doing resistance chair 3 times per week for 30 minutes.   Recent Visits:  11/22/2021 - PCP ) Follow up hyponatremia. BMP checked - sodium stable at 132.  11/28/2021 - Weight Management (Dr' Opalski) BP elevated. Stopped potassium supplement.  12/27/2021 - Weight Management (Dr 08/31/2021) Total weight loss 25lbs. Pre DM. Discused lipids PCSK9 and red yeast [name].  01/17/2022 - Weight Management (DrOpalski) Total weight loss 29lbs.  02/14/2022 - Weight Management (Dr Peggyann Juba) Total weight loss 33 lbs  SDOH (Social Determinants of Health) assessments and interventions performed:  SDOH  Interventions    Flowsheet Row Chronic Care Management from 10/30/2021 in [facility] Clinical Support from 09/14/2021 in [facility] Chronic Care Management from 05/23/2021 in [facility] Chronic Care Management from 11/23/2020 in [facility]  SDOH Interventions      Food Insecurity Interventions -- Intervention Not Indicated -- --  Housing Interventions -- Intervention Not Indicated -- --  Transportation Interventions -- Intervention Not Indicated -- --  Financial Strain Interventions -- -- Intervention Not Indicated --  Physical Activity Interventions Other (Comments)  [patient is slowly increasing activity - walking about 5 minutes a day.] -- Patient Refused  [patient injured back about 4 months ago and has not restarted exercise yet.] Intervention Not Indicated  Stress Interventions -- Intervention Not Indicated -- --  Social Connections Interventions -- Intervention Not Indicated -- --        Objective: Review of patient status, including review of consultants reports, laboratory and other test data, was performed as part of comprehensive evaluation and provision of chronic care management services.   Lab Results  Component Value Date   CREATININE 0.70 11/22/2021   CREATININE 0.71 11/06/2021   CREATININE 0.67 10/03/2021    Lab Results  Component Value Date   HGBA1C 5.8 (H) 08/29/2021       Component Value Date/Time   CHOL 273 (H) 08/29/2021 1127   TRIG 165 (H) 08/29/2021 1127   HDL 75 08/29/2021 1127   CHOLHDL 5 02/06/2021 0909   VLDL 40.6 (H) 02/06/2021 0909   LDLCALC  168 (H) 08/29/2021 1127   LDLCALC 170 (H) 02/01/2020 0834   LDLDIRECT 189.0 02/06/2021 0909     Clinical ASCVD: No  The ASCVD Risk score (Arnett DK, et al., 2019) failed to calculate for the following reasons:   The 2019 ASCVD risk score is  only valid for ages 59 to 12    BP Readings from Last 3 Encounters:  02/14/22 113/72  01/17/22 128/74  12/27/21 135/72     Allergies  Allergen Reactions   Atorvastatin Other (See Comments)    REACTION: MUSCLE PAINS   Diclofenac-Misoprostol Diarrhea and Nausea And Vomiting   Fenofibrate Other (See Comments)    REACTION: carpopedal spasms   Rosuvastatin Other (See Comments)    REACTION: MUSCLE PAINS   Statins Other (See Comments)    myalgias   Amoxicillin-Pot Clavulanate Other (See Comments)    REACTION: unspecified   Penicillins Hives   Sulfonamide Derivatives Hives   Tuberculin Ppd Other (See Comments)    Redness at sight    Medications Reviewed Today     Reviewed by Verdia Kuba, CMA (Certified Medical Assistant) on 02/14/22 at 1148  Med List Status: <None>   Medication Order Taking? Sig Documenting Provider Last Dose Status Informant  amLODipine (NORVASC) 10 MG tablet [id number] Yes TAKE 1 TABLET (10 MG TOTAL) BY MOUTH DAILY. [name], NP Taking Active   Ascorbic Acid (VITAMIN C) 1000 MG tablet [id number] Yes Take 1,000 mg by mouth daily. [provider] Taking Active Self  aspirin (ASPIRIN 81) 81 MG EC tablet [id number] Yes Take 1 tablet (81 mg total) by mouth daily. Swallow whole. [name], NP Taking Active   Calcium Carb-Cholecalciferol (CALCIUM 600+D) 600-10 MG-MCG TABS [id number] Yes Take 600 mg by mouth daily. [provider] Taking Active   carboxymethylcellulose (REFRESH PLUS) 0.5 % SOLN [id number] Yes Place 1 drop into both eyes 2 (two) times daily. [provider] Taking Active Self  Cholecalciferol (VITAMIN D) 125 MCG (5000 UT) CAPS [id number] Yes Take 1 capsule by mouth daily. [provider] Taking Active   Coenzyme Q10 200 MG capsule [id number] Yes Take 200 mg by mouth daily. [provider] Taking Active Self  denosumab (PROLIA) 60 MG/ML SOSY injection [id number] Yes Inject 60 mg into the skin every 6  (six) months. [provider] Taking Active   ezetimibe (ZETIA) 10 MG tablet [id number] Yes Take 1 tablet (10 mg total) by mouth daily. Marrian Salvage, FNP Taking Active   hydrALAZINE (APRESOLINE) 25 MG tablet [id number] Yes Take 1 tablet (25 mg total) by mouth 3 (three) times daily. [name], NP Taking Active   ibuprofen (ADVIL,MOTRIN) 200 MG tablet [id number] Yes Take 400 mg by mouth daily. [provider] Taking Active   Magnesium 400 MG TABS [id number] Yes Take 400 mg by mouth daily. [provider] Taking Active   metoprolol succinate (TOPROL-XL) 50 MG 24 hr tablet [id number] Yes Take 1&1/2 tablets (75 mg total) by mouth daily. Take with or immediately following a meal. [name], NP Taking Active   Multiple Vitamin (MULTIVITAMIN) tablet [id number] Yes Take 1 tablet by mouth daily. [provider] Taking Active Self  niacin 500 MG tablet [id number] Yes Take 500 mg by mouth at bedtime. [provider] Taking Active Self           Med Note De Blanch   Fri Apr 29, 2020  9:20 AM)    Omega-3 Fatty Acids (FISH OIL) 1000 MG CAPS [id number] Yes Take 1 capsule by  mouth every morning. [provider] Taking Active Self  omeprazole (PRILOSEC) 40 MG capsule [id number] Yes Take 1 capsule (40 mg total) by mouth daily. Sandford Craze, NP Taking Active             Patient Active Problem List   Diagnosis Date Noted   Hyperkalemia 11/22/2021   Prediabetes 10/12/2021   Vitamin D deficiency 10/12/2021   Leukocytosis 09/05/2021   Hyperalbuminemia 09/05/2021   Mixed hyperlipidemia 08/29/2021   Lumbar radiculopathy, chronic 08/25/2021   Preventative health care 12/14/2014   Hyponatremia 02/20/2014   Hypokalemia 02/20/2014   Hyperglycemia 11/17/2013   Need for prophylactic vaccination and inoculation against influenza 02/08/2012   HYPERCHOLESTEROLEMIA 04/01/2008   BARRETTS ESOPHAGUS 04/01/2008   OSTEOARTHRITIS 04/01/2008    URINARY INCONTINENCE 04/01/2008   TOBACCO ABUSE, HX OF 04/01/2008   GERD 09/24/2007   Essential hypertension 04/01/2007   Osteoporosis 05/31/2006   COLONIC POLYPS, HX OF 05/31/2006   NEPHROLITHIASIS, HX OF 05/31/2006     Medication Assistance:  None required.  Patient affirms current coverage meets needs.   Assessment / Plan: Hyperlipidemia: last LDL and Tg were not at goal Continue ezetimibe 10mg  daily.  Recheck lipids at upcoming PCP appointment in November 2023. December 2023  Hypertension: blood pressure improved.  Continue amlodipine 10mg  daily, hydralazine 25mg  3 times a day and metoprolol Er 50mg  - 1.5 tabs = 75mg  daily.  Hyponatremia - last serum sodium was 132 - still low but stable.  Due to recheck BMP with next PCP visit 03/2022 Pre - Diabetes: Last A1c 5.8% (08/29/2021).  Continue to follow up with weight management clinic. Patient is doing great.  Continue low sugar and low fat diet.  Continue exercise.  Osteoporosis:  Continue Prolia next due 08/2021.  Continue calcium and vitamin D supplementation. Continue weight bearing and resistance exercise Health Maintenance:  Discussed getting annual flu vaccine and updated COVID vaccine - patient has plans to get at pharmacy next week.  Discussed RSV vaccine - patient is considering because she is around her young grandchildren a lot.   Follow Up:  Telephone follow up appointment with care management team member scheduled for:  6 months   , PharmD Clinical Pharmacist [facility]  - [facility] [phone]

## 2022-03-07 ENCOUNTER — Other Ambulatory Visit (HOSPITAL_BASED_OUTPATIENT_CLINIC_OR_DEPARTMENT_OTHER): Payer: Self-pay

## 2022-03-07 MED ORDER — COMIRNATY 30 MCG/0.3ML IM SUSY
PREFILLED_SYRINGE | INTRAMUSCULAR | 0 refills | Status: DC
  Filled 2022-03-07: qty 0.3, 1d supply, fill #0

## 2022-03-07 MED ORDER — FLUAD QUADRIVALENT 0.5 ML IM PRSY
PREFILLED_SYRINGE | INTRAMUSCULAR | 0 refills | Status: DC
  Filled 2022-03-07: qty 0.5, 1d supply, fill #0

## 2022-03-12 ENCOUNTER — Ambulatory Visit (INDEPENDENT_AMBULATORY_CARE_PROVIDER_SITE_OTHER): Payer: PPO | Admitting: Family Medicine

## 2022-03-12 ENCOUNTER — Encounter (INDEPENDENT_AMBULATORY_CARE_PROVIDER_SITE_OTHER): Payer: Self-pay | Admitting: Family Medicine

## 2022-03-12 VITALS — BP 126/70 | HR 63 | Temp 98.2°F | Ht 60.0 in | Wt 138.0 lb

## 2022-03-12 DIAGNOSIS — R7303 Prediabetes: Secondary | ICD-10-CM | POA: Diagnosis not present

## 2022-03-12 DIAGNOSIS — Z6827 Body mass index (BMI) 27.0-27.9, adult: Secondary | ICD-10-CM

## 2022-03-12 DIAGNOSIS — I10 Essential (primary) hypertension: Secondary | ICD-10-CM | POA: Diagnosis not present

## 2022-03-12 DIAGNOSIS — E559 Vitamin D deficiency, unspecified: Secondary | ICD-10-CM

## 2022-03-12 DIAGNOSIS — E669 Obesity, unspecified: Secondary | ICD-10-CM | POA: Diagnosis not present

## 2022-03-15 ENCOUNTER — Ambulatory Visit (INDEPENDENT_AMBULATORY_CARE_PROVIDER_SITE_OTHER): Payer: PPO | Admitting: Family Medicine

## 2022-03-19 ENCOUNTER — Other Ambulatory Visit (HOSPITAL_BASED_OUTPATIENT_CLINIC_OR_DEPARTMENT_OTHER): Payer: Self-pay

## 2022-03-19 ENCOUNTER — Other Ambulatory Visit: Payer: Self-pay | Admitting: Family

## 2022-03-19 MED ORDER — EZETIMIBE 10 MG PO TABS
10.0000 mg | ORAL_TABLET | Freq: Every day | ORAL | 0 refills | Status: DC
  Filled 2022-03-19: qty 90, 90d supply, fill #0

## 2022-03-20 DIAGNOSIS — H903 Sensorineural hearing loss, bilateral: Secondary | ICD-10-CM | POA: Diagnosis not present

## 2022-03-22 NOTE — Progress Notes (Signed)
Chief Complaint:   OBESITY [name] is here to discuss her progress with her obesity treatment plan along with follow-up of her obesity related diagnoses. [name] is on the Category 2 Plan with breakfast and lunch options and states she is following her eating plan approximately 90% of the time. [name] states she is walking 10 minutes everyday and resistance chair 30 minutes 3 times per week.  Today's visit was #: 8 Starting weight: 170 lbs Starting date: 08/29/2021 Today's weight: 138 lbs Today's date: 03/12/2022 Total lbs lost to date: 32 lbs Total lbs lost since last in-office visit: 0  Interim History: [name] states "maintenance phase", it is a challenge for her. Resistance chair 30 min/3 day a week and using walker to go on the street to walk 10 minutes 3-4 days a week. No dizziness at all. Much less cravings and not hungry at all. Changed to Cat 2 last time and doing well with it.  Subjective:   1. 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 No hunger or cravings.  2. Essential hypertension [name] no longer  has dizziness. Blood pressure is stable at home. Denies too low of blood pressure. She has no concerns. Currently taking Norvasc, Toprol, Hydralazine.  3. Vitamin D deficiency Taking 5,000 IU over the counter everyday. Tolerating well with no issues. Vit D level of 59.5 on 08/29/21. No changes made recently.  Assessment/Plan:  No orders of the defined types were placed in this encounter.   There are no discontinued medications.   No orders of the defined types were placed in this encounter.    1. Prediabetes Continue to follow meal plan and lose weight. Increase activity as tolerated.  2. Essential hypertension Blood pressure at goal at 126/70 today. Continue medications per PCP, labs in near future with PCP. Continue Prudent Nutritional Plan and weight loss.  3. Vitamin D deficiency PCP to recheck labs in  future. Continue over the counter supplement.  4. Obesity, current BMI 27.0 [name] is currently in the action stage of change. As such, her goal is to continue with weight loss efforts. She has agreed to the Category 2 Plan with breakfast and lunch options.  [name] seeing PCP on  27th and will obtain blood work at that time. She declines today.  Exercise goals: As is.  Behavioral modification strategies: holiday eating strategies .  [name] has agreed to follow-up with our clinic in 5 weeks. She was informed of the importance of frequent follow-up visits to maximize her success with intensive lifestyle modifications for her multiple health conditions.   Objective:   Blood pressure 126/70, pulse 63, temperature 98.2 F (36.8 C), height 5' (1.524 m), weight 138 lb (62.6 kg), last menstrual period 05/14/1968, SpO2 97 %. Body mass index is 26.95 kg/m.  General: Cooperative, alert, well developed, in no acute distress. HEENT: Conjunctivae and lids unremarkable. Cardiovascular: Regular rhythm.  Lungs: Normal work of breathing. Neurologic: No focal deficits.   Lab Results  Component Value Date   CREATININE 0.70 11/22/2021   BUN 10 11/22/2021   NA 132 (L) 11/22/2021   K 4.9 11/22/2021   CL 102 11/22/2021   CO2 23 11/22/2021   Lab Results  Component Value Date   ALT 27 09/05/2021   AST 19 09/05/2021   ALKPHOS 58 09/05/2021   BILITOT 0.4 09/05/2021   Lab Results  Component Value Date   HGBA1C 5.8 (H) 08/29/2021   HGBA1C 5.8 02/06/2021  HGBA1C 5.6 07/21/2019   HGBA1C 5.5 01/17/2018   HGBA1C 5.5 07/12/2017   Lab Results  Component Value Date   INSULIN 10.0 08/29/2021   Lab Results  Component Value Date   TSH 3.530 08/29/2021   Lab Results  Component Value Date   CHOL 273 (H) 08/29/2021   HDL 75 08/29/2021   LDLCALC 168 (H) 08/29/2021   LDLDIRECT 189.0 02/06/2021   TRIG 165 (H) 08/29/2021   CHOLHDL 5 02/06/2021   Lab Results  Component Value Date   VD25OH 59.5  08/29/2021   VD25OH 49.28 08/01/2020   VD25OH 48 08/27/2012   Lab Results  Component Value Date   WBC 12.3 (H) 09/05/2021   HGB 13.0 09/05/2021   HCT 38.9 09/05/2021   MCV 89.6 09/05/2021   PLT 384.0 09/05/2021   No results found for: "IRON", "TIBC", "FERRITIN"  Attestation Statements:   Reviewed by clinician on day of visit: allergies, medications, problem list, medical history, surgical history, family history, social history, and previous encounter notes.  I, Brendell Tyus, am acting as Energy manager for [employer], DO.  I have reviewed the above documentation for accuracy and completeness, and I agree with the above. [name], D.O.  The 21st Century Cures Act was signed into law in 2016 which includes the topic of electronic health records.  This provides immediate access to information in MyChart.  This includes consultation notes, operative notes, office notes, lab results and pathology reports.  If you have any questions about what you read please let us know at your next visit so we can discuss your concerns and take corrective action if need be.  We are right here with you.

## 2022-04-02 NOTE — Telephone Encounter (Signed)
Last Prolia inj 02/21/22 Next Prolia inj due 08/24/22  Forwarding to Rx prior auth team.

## 2022-04-03 DIAGNOSIS — H903 Sensorineural hearing loss, bilateral: Secondary | ICD-10-CM | POA: Diagnosis not present

## 2022-04-09 ENCOUNTER — Other Ambulatory Visit (HOSPITAL_BASED_OUTPATIENT_CLINIC_OR_DEPARTMENT_OTHER): Payer: Self-pay

## 2022-04-09 ENCOUNTER — Ambulatory Visit (INDEPENDENT_AMBULATORY_CARE_PROVIDER_SITE_OTHER): Payer: PPO | Admitting: Family

## 2022-04-09 VITALS — BP 138/55 | HR 61 | Temp 97.4°F | Resp 16 | Wt 140.0 lb

## 2022-04-09 DIAGNOSIS — M810 Age-related osteoporosis without current pathological fracture: Secondary | ICD-10-CM | POA: Diagnosis not present

## 2022-04-09 DIAGNOSIS — I10 Essential (primary) hypertension: Secondary | ICD-10-CM | POA: Diagnosis not present

## 2022-04-09 DIAGNOSIS — E871 Hypo-osmolality and hyponatremia: Secondary | ICD-10-CM | POA: Diagnosis not present

## 2022-04-09 DIAGNOSIS — K2270 Barrett's esophagus without dysplasia: Secondary | ICD-10-CM

## 2022-04-09 DIAGNOSIS — E7800 Pure hypercholesterolemia, unspecified: Secondary | ICD-10-CM | POA: Diagnosis not present

## 2022-04-09 DIAGNOSIS — R7303 Prediabetes: Secondary | ICD-10-CM | POA: Diagnosis not present

## 2022-04-09 DIAGNOSIS — E559 Vitamin D deficiency, unspecified: Secondary | ICD-10-CM | POA: Diagnosis not present

## 2022-04-09 LAB — MICROALBUMIN / CREATININE URINE RATIO
Creatinine,U: 64 mg/dL
Microalb Creat Ratio: 1.5 mg/g (ref 0.0–30.0)
Microalb, Ur: 1 mg/dL (ref 0.0–1.9)

## 2022-04-09 LAB — LIPID PANEL
Cholesterol: 233 mg/dL — ABNORMAL HIGH (ref 0–200)
HDL: 62.8 mg/dL (ref >39.00)
LDL Cholesterol: 149 mg/dL — ABNORMAL HIGH (ref 0–99)
NonHDL: 169.74
Total CHOL/HDL Ratio: 4
Triglycerides: 104 mg/dL (ref 0.0–149.0)
VLDL: 20.8 mg/dL (ref 0.0–40.0)

## 2022-04-09 LAB — COMPREHENSIVE METABOLIC PANEL
ALT: 13 U/L (ref 0–35)
AST: 16 U/L (ref 0–37)
Albumin: 4.3 g/dL (ref 3.5–5.2)
Alkaline Phosphatase: 49 U/L (ref 39–117)
BUN: 10 mg/dL (ref 6–23)
CO2: 24 mEq/L (ref 19–32)
Calcium: 8.9 mg/dL (ref 8.4–10.5)
Chloride: 97 mEq/L (ref 96–112)
Creatinine, Ser: 0.66 mg/dL (ref 0.40–1.20)
GFR: 82.52 mL/min (ref >60.00)
Glucose, Bld: 92 mg/dL (ref 70–99)
Potassium: 4.3 mEq/L (ref 3.5–5.1)
Sodium: 130 mEq/L — ABNORMAL LOW (ref 135–145)
Total Bilirubin: 0.5 mg/dL (ref 0.2–1.2)
Total Protein: 6.6 g/dL (ref 6.0–8.3)

## 2022-04-09 LAB — VITAMIN D 25 HYDROXY (VIT D DEFICIENCY, FRACTURES): VITD: 90.14 ng/mL (ref 30.00–100.00)

## 2022-04-09 LAB — HEMOGLOBIN A1C: Hgb A1c MFr Bld: 5.8 % (ref 4.6–6.5)

## 2022-04-09 MED ORDER — AREXVY 120 MCG/0.5ML IM SUSR
INTRAMUSCULAR | 0 refills | Status: DC
  Filled 2022-04-09: qty 0.5, 1d supply, fill #0

## 2022-04-09 NOTE — Assessment & Plan Note (Signed)
She is maintained on calcium, vit D and Q6 month prolia.

## 2022-04-09 NOTE — Assessment & Plan Note (Signed)
BP Readings from Last 3 Encounters:  04/09/22 (!) 138/55  03/12/22 126/70  02/14/22 113/72   Stable- continue metoprolol, hydralazine and amlodipine.  Wt Readings from Last 3 Encounters:  04/09/22 140 lb (63.5 kg)  03/12/22 138 lb (62.6 kg)  02/14/22 137 lb (62.1 kg)

## 2022-04-09 NOTE — Assessment & Plan Note (Signed)
>>  ASSESSMENT AND PLAN FOR ESSENTIAL HYPERTENSION WRITTEN ON 04/09/2022 10:15 AM BY O'SULLIVAN, Diallo Ponder, NP  BP Readings from Last 3 Encounters:  04/09/22 (!) 138/55  03/12/22 126/70  02/14/22 113/72   Stable- continue metoprolol, hydralazine and amlodipine.  Wt Readings from Last 3 Encounters:  04/09/22 140 lb (63.5 kg)  03/12/22 138 lb (62.6 kg)  02/14/22 137 lb (62.1 kg)

## 2022-04-09 NOTE — Assessment & Plan Note (Signed)
Stable continue omeprazole 

## 2022-04-09 NOTE — Progress Notes (Signed)
Subjective:   By signing my name below, I, [name], attest that this documentation has been prepared under the direction and in the presence of [name], 04/09/2022.    Patient ID: [name], female    DOB: 06-21-1940, 81 y.o.   MRN: [id number]  Chief Complaint  Patient presents with   Hypertension    Here for follow up    HPI Patient is in today for an office visit.  Hypertension Patients blood pressure is normal this visit. She is complaint with her 10 mg Norvasc, 50 mg toprol-xl, and 25 mg Apresoline. BP Readings from Last 3 Encounters:  04/09/22 (!) 138/55  03/12/22 126/70  02/14/22 113/72   Pulse Readings from Last 3 Encounters:  04/09/22 61  03/12/22 63  02/14/22 65   GERD Patient is complaint with her 40 mg Prilosec medication.  Calcium deficiency Patient reports that she is complaint with 600-10 mg Calcium supplements.  Vitamin D deficiency  Patient reports that she is complaint with her 125 mcg vitamin D supplements.   Health Maintenance Due  Topic Date Due   Diabetic kidney evaluation - Urine ACR  Never done   COVID-19 Vaccine (6 - 2023-24 season) 01/12/2022   HEMOGLOBIN A1C  02/28/2022    Past Medical History:  Diagnosis Date   Arthritis    osteoarthritis   Back pain    Barrett's esophagus    Cataract    bilateral cateracts removed   Chronic kidney disease    kidney stones   Chronic obstructive bronchitis    Dr Joya Gaskins   Colitis    Colon polyps    GERD (gastroesophageal reflux disease)    History of chicken pox    History of nephrolithiasis    History of transfusion of packed red blood cells    Hypercholesteremia    Hyperlipidemia    Hypertension    IFG (impaired fasting glucose)    Osteoarthritis    Osteoporosis    pt cannot tolerate fosamax   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   Swelling of both lower extremities    Urinary incontinence     Past Surgical  History:  Procedure Laterality Date   ABDOMINAL HYSTERECTOMY  1970   APPENDECTOMY     CHOLECYSTECTOMY     LEG SURGERY  2008   right   TOTAL KNEE ARTHROPLASTY  04/2006   total left knee replacement    Family History  Problem Relation Age of Onset   Coronary artery disease Mother    Arthritis Mother    High blood pressure Mother    Heart disease Mother    High blood pressure Father    Heart disease Father    Asthma Maternal Grandmother    Tuberculosis Other    Colon cancer Neg Hx    Esophageal cancer Neg Hx    Rectal cancer Neg Hx    Stomach cancer Neg Hx     Social History   Socioeconomic History   Marital status: Widowed    Spouse name: Not on file   Number of children: Not on file   Years of education: Not on file   Highest education level: Not on file  Occupational History   Occupation: Retired  Tobacco Use   Smoking status: Former    Years: 15.00    Types: Cigarettes    Quit date: 05/15/1967    Years since quitting: 54.9   Smokeless tobacco: Never  Substance and Sexual Activity   Alcohol use: No   Drug use: No   Sexual activity: Never  Other Topics Concern   Not on file  Social History Narrative   Regular exercise: yes   Social Determinants of Health   Financial Resource Strain: Low Risk  (05/23/2021)   Overall Financial Resource Strain (CARDIA)    Difficulty of Paying Living Expenses: Not hard at all  Food Insecurity: No Food Insecurity (09/14/2021)   Hunger Vital Sign    Worried About Running Out of Food in the Last Year: Never true    [place] in the Last Year: Never true  Transportation Needs: No Transportation Needs (09/14/2021)   PRAPARE - Hydrologist (Medical): No    Lack of Transportation (Non-Medical): No  Physical Activity: Insufficiently Active (10/30/2021)   Exercise Vital Sign    Days of Exercise per Week: 4 days    Minutes of Exercise per Session: 10 min  Stress: No Stress Concern Present (09/14/2021)    [place]    Feeling of Stress : Not at all  Social Connections: Moderately Integrated (09/14/2021)   Social Connection and Isolation Panel [NHANES]    Frequency of Communication with Friends and Family: More than three times a week    Frequency of Social Gatherings with Friends and Family: More than three times a week    Attends Religious Services: More than 4 times per year    Active Member of [employer] or Organizations: Yes    Attends Archivist Meetings: More than 4 times per year    Marital Status: Widowed  Intimate Partner Violence: Not At Risk (09/14/2021)   Humiliation, Afraid, Rape, and Kick questionnaire    Fear of Current or Ex-Partner: No    Emotionally Abused: No    Physically Abused: No    Sexually Abused: No    Outpatient Medications Prior to Visit  Medication Sig Dispense Refill   amLODipine (NORVASC) 10 MG tablet TAKE 1 TABLET (10 MG TOTAL) BY MOUTH DAILY. 90 tablet 1   Ascorbic Acid (VITAMIN C) 1000 MG tablet Take 1,000 mg by mouth daily.     aspirin (ASPIRIN 81) 81 MG EC tablet Take 1 tablet (81 mg total) by mouth daily. Swallow whole. 30 tablet 12   Calcium Carb-Cholecalciferol (CALCIUM 600+D) 600-10 MG-MCG TABS Take 600 mg by mouth daily.     carboxymethylcellulose (REFRESH PLUS) 0.5 % SOLN Place 1 drop into both eyes 2 (two) times daily.     Cholecalciferol (VITAMIN D) 125 MCG (5000 UT) CAPS Take 1 capsule by mouth daily.     Coenzyme Q10 200 MG capsule Take 200 mg by mouth daily.     denosumab (PROLIA) 60 MG/ML SOSY injection Inject 60 mg into the skin every 6 (six) months.     ezetimibe (ZETIA) 10 MG tablet Take 1 tablet (10 mg total) by mouth daily. 90 tablet 0   hydrALAZINE (APRESOLINE) 25 MG tablet Take 1 tablet (25 mg total) by mouth 3 (three) times daily. 270 tablet 1   ibuprofen (ADVIL,MOTRIN) 200 MG tablet Take 400 mg by mouth daily.     Magnesium 400 MG TABS Take 400 mg by mouth daily.      metoprolol succinate (TOPROL-XL) 50 MG 24 hr tablet Take 1&1/2 tablets (75 mg total) by mouth daily. Take with or immediately following a meal. 135 tablet 1   Multiple Vitamin (MULTIVITAMIN) tablet Take  1 tablet by mouth daily.     niacin 500 MG tablet Take 500 mg by mouth at bedtime.     Omega-3 Fatty Acids (FISH OIL) 1000 MG CAPS Take 1 capsule by mouth every morning.     omeprazole (PRILOSEC) 40 MG capsule Take 1 capsule (40 mg total) by mouth daily. 90 capsule 1   COVID-19 mRNA vaccine 2023-2024 (COMIRNATY) syringe Inject into the muscle. 0.3 mL 0   influenza vaccine adjuvanted (FLUAD QUADRIVALENT) 0.5 ML injection Inject into the muscle. 0.5 mL 0   No facility-administered medications prior to visit.    Allergies  Allergen Reactions   Atorvastatin Other (See Comments)    REACTION: MUSCLE PAINS   Diclofenac-Misoprostol Diarrhea and Nausea And Vomiting   Fenofibrate Other (See Comments)    REACTION: carpopedal spasms   Rosuvastatin Other (See Comments)    REACTION: MUSCLE PAINS   Statins Other (See Comments)    myalgias   Amoxicillin-Pot Clavulanate Other (See Comments)    REACTION: unspecified   Penicillins Hives   Sulfonamide Derivatives Hives   Tuberculin Ppd Other (See Comments)    Redness at sight    ROS    See HPI Objective:    Physical Exam Constitutional:      General: She is not in acute distress.    Appearance: Normal appearance. She is not ill-appearing.  HENT:     Head: Normocephalic and atraumatic.     Right Ear: External ear normal.     Left Ear: External ear normal.  Eyes:     Extraocular Movements: Extraocular movements intact.     Pupils: Pupils are equal, round, and reactive to light.  Cardiovascular:     Rate and Rhythm: Normal rate and regular rhythm.     Heart sounds: Normal heart sounds. No murmur heard.    No gallop.  Pulmonary:     Effort: Pulmonary effort is normal. No respiratory distress.     Breath sounds: Normal breath sounds. No  wheezing or rales.  Skin:    General: Skin is warm and dry.  Neurological:     Mental Status: She is alert and oriented to person, place, and time.  Psychiatric:        Mood and Affect: Mood normal.        Behavior: Behavior normal.        Judgment: Judgment normal.     BP (!) 138/55   Pulse 61   Temp (!) 97.4 F (36.3 C) (Oral)   Resp 16   Wt 140 lb (63.5 kg)   LMP 05/14/1968   SpO2 100%   BMI 27.34 kg/m  Wt Readings from Last 3 Encounters:  04/09/22 140 lb (63.5 kg)  03/12/22 138 lb (62.6 kg)  02/14/22 137 lb (62.1 kg)       Assessment & Plan:   Problem List Items Addressed This Visit       Unprioritized   Vitamin D deficiency   Relevant Orders   VITAMIN D 25 Hydroxy (Vit-D Deficiency, Fractures)   Prediabetes - Primary   Relevant Orders   Urine Microalbumin w/creat. ratio   Hemoglobin A1c   Osteoporosis    She is maintained on calcium, vit D and Q6 month prolia.        Hyponatremia   Relevant Orders   Comp Met (CMET)   HYPERCHOLESTEROLEMIA   Relevant Orders   Lipid panel   Essential hypertension    BP Readings from Last 3 Encounters:  04/09/22 (!) 138/55  03/12/22 126/70  02/14/22 113/72  Stable- continue metoprolol, hydralazine and amlodipine.  Wt Readings from Last 3 Encounters:  04/09/22 140 lb (63.5 kg)  03/12/22 138 lb (62.6 kg)  02/14/22 137 lb (62.1 kg)        BARRETTS ESOPHAGUS    Stable- continue omeprazole.       No orders of the defined types were placed in this encounter.   I, [name], personally preformed the services described in this documentation.  All medical record entries made by the scribe were at my direction and in my presence.  I have reviewed the chart and discharge instructions (if applicable) and agree that the record reflects my personal performance and is accurate and complete. 04/09/2022.   I,Verona Buck,acting as a Education administrator for [employer], NP.,have documented all relevant documentation on  the behalf of Nance Pear, NP,as directed by  Nance Pear, NP while in the presence of Nance Pear, NP.    Nance Pear, NP

## 2022-04-11 ENCOUNTER — Telehealth: Payer: Self-pay | Admitting: Family

## 2022-04-11 ENCOUNTER — Other Ambulatory Visit (HOSPITAL_BASED_OUTPATIENT_CLINIC_OR_DEPARTMENT_OTHER): Payer: Self-pay

## 2022-04-11 MED ORDER — VITAMIN D3 50 MCG (2000 UT) PO CAPS: 2000.0000 [IU] | ORAL_CAPSULE | Freq: Every day | ORAL | Status: AC

## 2022-04-11 NOTE — Telephone Encounter (Signed)
Vit D is high normal. Please decrease vit D supplement from 5000 iu daily to 2000 iu daily.   Cholesterol is mildly elevated. Please continue work on diet/exercise/weight loss. Sodium is still low but stable.

## 2022-04-11 NOTE — Telephone Encounter (Signed)
Patient advised of results, to decrease vit d supplement dose and work on her low cholesterol diet

## 2022-04-17 ENCOUNTER — Other Ambulatory Visit: Payer: Self-pay | Admitting: Family

## 2022-04-17 ENCOUNTER — Other Ambulatory Visit (HOSPITAL_BASED_OUTPATIENT_CLINIC_OR_DEPARTMENT_OTHER): Payer: Self-pay

## 2022-04-17 MED ORDER — AMLODIPINE BESYLATE 10 MG PO TABS
10.0000 mg | ORAL_TABLET | Freq: Every day | ORAL | 1 refills | Status: DC
  Filled 2022-04-17: qty 90, 90d supply, fill #0
  Filled 2022-07-19: qty 90, 90d supply, fill #1

## 2022-04-18 ENCOUNTER — Ambulatory Visit (INDEPENDENT_AMBULATORY_CARE_PROVIDER_SITE_OTHER): Payer: PPO | Admitting: Family Medicine

## 2022-04-18 ENCOUNTER — Encounter (INDEPENDENT_AMBULATORY_CARE_PROVIDER_SITE_OTHER): Payer: Self-pay | Admitting: Family Medicine

## 2022-04-18 VITALS — BP 146/74 | HR 64 | Temp 98.6°F | Ht 60.0 in | Wt 134.6 lb

## 2022-04-18 DIAGNOSIS — E669 Obesity, unspecified: Secondary | ICD-10-CM

## 2022-04-18 DIAGNOSIS — E559 Vitamin D deficiency, unspecified: Secondary | ICD-10-CM

## 2022-04-18 DIAGNOSIS — R7303 Prediabetes: Secondary | ICD-10-CM | POA: Diagnosis not present

## 2022-04-18 DIAGNOSIS — E7849 Other hyperlipidemia: Secondary | ICD-10-CM

## 2022-04-18 DIAGNOSIS — I10 Essential (primary) hypertension: Secondary | ICD-10-CM | POA: Diagnosis not present

## 2022-04-18 DIAGNOSIS — Z6826 Body mass index (BMI) 26.0-26.9, adult: Secondary | ICD-10-CM | POA: Diagnosis not present

## 2022-05-02 DIAGNOSIS — E669 Obesity, unspecified: Secondary | ICD-10-CM | POA: Insufficient documentation

## 2022-05-02 DIAGNOSIS — E66811 Obesity, class 1: Secondary | ICD-10-CM | POA: Insufficient documentation

## 2022-05-02 DIAGNOSIS — E7849 Other hyperlipidemia: Secondary | ICD-10-CM | POA: Insufficient documentation

## 2022-05-06 NOTE — Progress Notes (Signed)
Chief Complaint:   OBESITY [name] is here to discuss her progress with her obesity treatment plan along with follow-up of her obesity related diagnoses. [name] is on the Category 2 Plan plus breakfast and lunch options and states she is following her eating plan approximately 90% of the time. [name] states she is chair exercises, walking 10 to 30 minutes 3 times per week.  Today's visit was #: 9 Starting weight: 170 LBS Starting date: 08/29/2021 Today's weight: 134 LBS Today's date: 04/18/2022 Total lbs lost to date: 36 LBS Total lbs lost since last in-office visit: 4 LBS  Interim History: Patient did great over Thanksgiving day.  She lost almost 3 pounds in fat mass and gain and muscle mass.  She was extra good and even had a birthday celebration.  Patient recently had labs with PCP and asked me to review with her on 04/09/2022.  Labs reviewed FLP, CMP, vitamin D, A1c.  Subjective:   1. Essential hypertension Discussed labs with patient today. Blood pressure at home 130s/60s over 70's.  Patient is asymptomatic and feels great.  CMP stable, sodium still low, which is being followed and managed by her PCP.  Otherwise CMP within normal limits.  2. Prediabetes Discussed labs with patient today. A1c 5.8 recently.  Not worse, no change from prior 8 months ago and 1-1/2 years ago.  3. Vitamin D deficiency Discussed labs with patient today. Recently was 90.149 days ago patient was on 5000 IUs OTC vitamin D.  4. Other hyperlipidemia Discussed labs with patient today. Still with increased LDL (149).  Patient declined statins in the past.  HDL greater than 60, triglycerides have improved.  Patient very happy with the results.  Assessment/Plan:  No orders of the defined types were placed in this encounter.   There are no discontinued medications.   No orders of the defined types were placed in this encounter.    1. Essential hypertension Blood pressure at goal at home, continue  medication per PCP and specialist.  Continue PNP and exercise and weight loss.  2. Prediabetes Continue PNP, exercise, and routine monitoring.  3. Vitamin D deficiency Continue with every 2-year bone density exams.  Counseling done on lipophilic vitamins and agrees with decreasing dose to 2000 IU OTC daily.  4. Other hyperlipidemia Continue PNP with low saturated and trans fat diet and increase exercise.  Recommend she discuss with her PCP medication management, but declines.  Continue Zetia per PCP specialist and fish oil.  5. Obesity, current BMI 26.3 [name] is currently in the action stage of change. As such, her goal is to continue with weight loss efforts. She has agreed to the Category 2 Plan with breakfast and lunch options.  Exercise goals:  As is.  Behavioral modification strategies: holiday eating strategies  and avoiding temptations.  [name] has agreed to follow-up with our clinic in 4 weeks. She was informed of the importance of frequent follow-up visits to maximize her success with intensive lifestyle modifications for her multiple health conditions.   Objective:   Blood pressure (!) 146/74, pulse 64, temperature 98.6 F (37 C), height 5' (1.524 m), weight 134 lb 9.6 oz (61.1 kg), last menstrual period 05/14/1968, SpO2 98 %. Body mass index is 26.29 kg/m.  General: Cooperative, alert, well developed, in no acute distress. HEENT: Conjunctivae and lids unremarkable. Cardiovascular: Regular rhythm.  Lungs: Normal work of breathing. Neurologic: No focal deficits.   Lab Results  Component Value Date   CREATININE 0.66 04/09/2022  BUN 10 04/09/2022   NA 130 (L) 04/09/2022   K 4.3 04/09/2022   CL 97 04/09/2022   CO2 24 04/09/2022   Lab Results  Component Value Date   ALT 13 04/09/2022   AST 16 04/09/2022   ALKPHOS 49 04/09/2022   BILITOT 0.5 04/09/2022   Lab Results  Component Value Date   HGBA1C 5.8 04/09/2022   HGBA1C 5.8 (H) 08/29/2021   HGBA1C 5.8  02/06/2021   HGBA1C 5.6 07/21/2019   HGBA1C 5.5 01/17/2018   Lab Results  Component Value Date   INSULIN 10.0 08/29/2021   Lab Results  Component Value Date   TSH 3.530 08/29/2021   Lab Results  Component Value Date   CHOL 233 (H) 04/09/2022   HDL 62.80 04/09/2022   LDLCALC 149 (H) 04/09/2022   LDLDIRECT 189.0 02/06/2021   TRIG 104.0 04/09/2022   CHOLHDL 4 04/09/2022   Lab Results  Component Value Date   VD25OH 90.14 04/09/2022   VD25OH 59.5 08/29/2021   VD25OH 49.28 08/01/2020   Lab Results  Component Value Date   WBC 12.3 (H) 09/05/2021   HGB 13.0 09/05/2021   HCT 38.9 09/05/2021   MCV 89.6 09/05/2021   PLT 384.0 09/05/2021   No results found for: "IRON", "TIBC", "FERRITIN"  Attestation Statements:   Reviewed by clinician on day of visit: allergies, medications, problem list, medical history, surgical history, family history, social history, and previous encounter notes.  Time spent on visit including pre-visit chart review and post-visit care and charting was 40 minutes.   I, Malcolm Metro, RMA, am acting as Energy manager for [employer], DO.  I have reviewed the above documentation for accuracy and completeness, and I agree with the above. [name], D.O.  The 21st Century Cures Act was signed into law in 2016 which includes the topic of electronic health records.  This provides immediate access to information in MyChart.  This includes consultation notes, operative notes, office notes, lab results and pathology reports.  If you have any questions about what you read please let us know at your next visit so we can discuss your concerns and take corrective action if need be.  We are right here with you.

## 2022-05-18 ENCOUNTER — Ambulatory Visit (INDEPENDENT_AMBULATORY_CARE_PROVIDER_SITE_OTHER): Payer: PPO | Admitting: Family

## 2022-05-18 ENCOUNTER — Encounter: Payer: Self-pay | Admitting: Family

## 2022-05-18 ENCOUNTER — Other Ambulatory Visit (HOSPITAL_BASED_OUTPATIENT_CLINIC_OR_DEPARTMENT_OTHER): Payer: Self-pay

## 2022-05-18 VITALS — BP 138/49 | HR 60 | Temp 98.4°F | Resp 16 | Wt 139.0 lb

## 2022-05-18 DIAGNOSIS — K146 Glossodynia: Secondary | ICD-10-CM | POA: Insufficient documentation

## 2022-05-18 MED ORDER — NYSTATIN 100000 UNIT/ML MT SUSP
5.0000 mL | Freq: Four times a day (QID) | OROMUCOSAL | 0 refills | Status: AC
  Filled 2022-05-18: qty 150, 8d supply, fill #0

## 2022-05-18 NOTE — Assessment & Plan Note (Signed)
New. Will obtain b12/folate to rule out b12 deficiency glossitis.  Empiric treatment with nystatin suspension. She will let me know if symptoms worsen or if symptoms do not improve. Follow up as scheduled in march.

## 2022-05-18 NOTE — Progress Notes (Signed)
Subjective:   By signing my name below, I, Eugene Gavia, attest that this documentation has been prepared under the direction and in the presence of Nance Pear, NP 05/18/22   Patient ID: [name], female    DOB: [birth year], 82 y.o.   MRN: [id number]  Chief Complaint  Patient presents with   Thrush    Complains of oral pain and soreness     HPI Patient is in today for evaluation of possible thrush.  Tongue pain: She reports that her tongue is red and painful. She feels as if she has blisters in her mouth. Her pain is worsened with eating or drinking anything.  Lab Results  Component Value Date   [id number] 08/29/2021      Past Medical History:  Diagnosis Date   Arthritis    osteoarthritis   Back pain    Barrett's esophagus    Cataract    bilateral cateracts removed   Chronic kidney disease    kidney stones   Chronic obstructive bronchitis    Dr Joya Gaskins   Colitis    Colon polyps    GERD (gastroesophageal reflux disease)    History of chicken pox    History of nephrolithiasis    History of transfusion of packed red blood cells    Hypercholesteremia    Hyperlipidemia    Hypertension    IFG (impaired fasting glucose)    Osteoarthritis    Osteoporosis    pt cannot tolerate fosamax   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   Swelling of both lower extremities    Urinary incontinence     Past Surgical History:  Procedure Laterality Date   ABDOMINAL HYSTERECTOMY  1970   APPENDECTOMY     CHOLECYSTECTOMY     LEG SURGERY  2008   right   TOTAL KNEE ARTHROPLASTY  04/2006   total left knee replacement    Family History  Problem Relation Age of Onset   Coronary artery disease Mother    Arthritis Mother    High blood pressure Mother    Heart disease Mother    High blood pressure Father    Heart disease Father    Asthma Maternal Grandmother    Tuberculosis Other    Colon cancer Neg Hx     Esophageal cancer Neg Hx    Rectal cancer Neg Hx    Stomach cancer Neg Hx     Social History   Socioeconomic History   Marital status: Widowed    Spouse name: Not on file   Number of children: Not on file   Years of education: Not on file   Highest education level: Not on file  Occupational History   Occupation: Retired  Tobacco Use   Smoking status: Former    Years: 15.00    Types: Cigarettes    Quit date: 05/15/1967    Years since quitting: 55.0   Smokeless tobacco: Never  Substance and Sexual Activity   Alcohol use: No   Drug use: No   Sexual activity: Never  Other Topics Concern   Not on file  Social History Narrative   Regular exercise: yes   Social Determinants of Health   Financial Resource Strain: Low Risk  (05/23/2021)   Overall Financial Resource Strain (CARDIA)    Difficulty of Paying Living Expenses: Not hard at all  Food Insecurity: No Food Insecurity (09/14/2021)   Hunger Vital Sign  Worried About Charity fundraiser in the Last Year: Never true    [place] in the Last Year: Never true  Transportation Needs: No Transportation Needs (09/14/2021)   PRAPARE - Hydrologist (Medical): No    Lack of Transportation (Non-Medical): No  Physical Activity: Insufficiently Active (10/30/2021)   Exercise Vital Sign    Days of Exercise per Week: 4 days    Minutes of Exercise per Session: 10 min  Stress: No Stress Concern Present (09/14/2021)   [place]    Feeling of Stress : Not at all  Social Connections: Moderately Integrated (09/14/2021)   Social Connection and Isolation Panel [NHANES]    Frequency of Communication with Friends and Family: More than three times a week    Frequency of Social Gatherings with Friends and Family: More than three times a week    Attends Religious Services: More than 4 times per year    Active Member of [employer] or Organizations: Yes     Attends Archivist Meetings: More than 4 times per year    Marital Status: Widowed  Intimate Partner Violence: Not At Risk (09/14/2021)   Humiliation, Afraid, Rape, and Kick questionnaire    Fear of Current or Ex-Partner: No    Emotionally Abused: No    Physically Abused: No    Sexually Abused: No    Outpatient Medications Prior to Visit  Medication Sig Dispense Refill   amLODipine (NORVASC) 10 MG tablet Take 1 tablet (10 mg total) by mouth daily. 90 tablet 1   Ascorbic Acid (VITAMIN C) 1000 MG tablet Take 1,000 mg by mouth daily.     aspirin (ASPIRIN 81) 81 MG EC tablet Take 1 tablet (81 mg total) by mouth daily. Swallow whole. 30 tablet 12   Calcium Carb-Cholecalciferol (CALCIUM 600+D) 600-10 MG-MCG TABS Take 600 mg by mouth daily.     carboxymethylcellulose (REFRESH PLUS) 0.5 % SOLN Place 1 drop into both eyes 2 (two) times daily.     Cholecalciferol (VITAMIN D3) 50 MCG (2000 UT) capsule Take 1 capsule (2,000 Units total) by mouth daily.     Coenzyme Q10 200 MG capsule Take 200 mg by mouth daily.     denosumab (PROLIA) 60 MG/ML SOSY injection Inject 60 mg into the skin every 6 (six) months.     ezetimibe (ZETIA) 10 MG tablet Take 1 tablet (10 mg total) by mouth daily. 90 tablet 0   hydrALAZINE (APRESOLINE) 25 MG tablet Take 1 tablet (25 mg total) by mouth 3 (three) times daily. 270 tablet 1   ibuprofen (ADVIL,MOTRIN) 200 MG tablet Take 400 mg by mouth daily.     Magnesium 400 MG TABS Take 400 mg by mouth daily.     metoprolol succinate (TOPROL-XL) 50 MG 24 hr tablet Take 1&1/2 tablets (75 mg total) by mouth daily. Take with or immediately following a meal. 135 tablet 1   Multiple Vitamin (MULTIVITAMIN) tablet Take 1 tablet by mouth daily.     niacin 500 MG tablet Take 500 mg by mouth at bedtime.     Omega-3 Fatty Acids (FISH OIL) 1000 MG CAPS Take 1 capsule by mouth every morning.     omeprazole (PRILOSEC) 40 MG capsule Take 1 capsule (40 mg total) by mouth daily. 90 capsule 1    RSV vaccine recomb adjuvanted (AREXVY) 120 MCG/0.5ML injection Inject into the muscle. 0.5 mL 0   No facility-administered medications prior  to visit.    Allergies  Allergen Reactions   Atorvastatin Other (See Comments)    REACTION: MUSCLE PAINS   Diclofenac-Misoprostol Diarrhea and Nausea And Vomiting   Fenofibrate Other (See Comments)    REACTION: carpopedal spasms   Rosuvastatin Other (See Comments)    REACTION: MUSCLE PAINS   Statins Other (See Comments)    myalgias   Amoxicillin-Pot Clavulanate Other (See Comments)    REACTION: unspecified   Penicillins Hives   Sulfonamide Derivatives Hives   Tuberculin Ppd Other (See Comments)    Redness at sight    Review of Systems  Constitutional:  Negative for fever and malaise/fatigue.  HENT:  Negative for congestion.        + Tongue pain, oral blistering  Eyes:  Negative for blurred vision.  Respiratory:  Negative for shortness of breath.   Cardiovascular:  Negative for chest pain, palpitations and leg swelling.  Gastrointestinal:  Negative for abdominal pain, blood in stool and nausea.  Genitourinary:  Negative for dysuria and frequency.  Musculoskeletal:  Negative for falls.  Skin:  Negative for rash.  Neurological:  Negative for dizziness, loss of consciousness and headaches.  Endo/Heme/Allergies:  Negative for environmental allergies.  Psychiatric/Behavioral:  Negative for depression. The patient is not nervous/anxious.        Objective:    Physical Exam Constitutional:      Appearance: Normal appearance. She is well-developed.  HENT:     Head: Normocephalic and atraumatic.     Mouth/Throat:     Mouth: Mucous membranes are moist.     Comments: Thin white coating on posterior tongue Tongue is glossy and red  Eyes:     General: Lids are normal.     Extraocular Movements: Extraocular movements intact.     Conjunctiva/sclera: Conjunctivae normal.  Pulmonary:     Effort: Pulmonary effort is normal.   Musculoskeletal:        General: Normal range of motion.     Cervical back: Normal range of motion and neck supple.  Skin:    General: Skin is warm and dry.  Neurological:     Mental Status: She is alert and oriented to person, place, and time.  Psychiatric:        Attention and Perception: Attention and perception normal.        Mood and Affect: Mood normal.        Behavior: Behavior normal.        Thought Content: Thought content normal.        Judgment: Judgment normal.     BP (!) 138/49 (BP Location: Right Arm, Patient Position: Sitting, Cuff Size: Small)   Pulse 60   Temp 98.4 F (36.9 C) (Oral)   Resp 16   Wt 139 lb (63 kg)   LMP 05/14/1968   SpO2 99%   BMI 27.15 kg/m  Wt Readings from Last 3 Encounters:  05/18/22 139 lb (63 kg)  04/18/22 134 lb 9.6 oz (61.1 kg)  04/09/22 140 lb (63.5 kg)       Assessment & Plan:  Tongue pain Assessment & Plan: New. Will obtain b12/folate to rule out b12 deficiency glossitis.  Empiric treatment with nystatin suspension. She will let me know if symptoms worsen or if symptoms do not improve. Follow up as scheduled in march.   Orders: -     B12 and Folate Panel  Other orders -     Nystatin; Take 5 mLs (500,000 Units total) by mouth 4 (four) times daily  for 7 days.  Dispense: 150 mL; Refill: 0     I,Alexis Herring,acting as a scribe for Nance Pear, NP.,have documented all relevant documentation on the behalf of Nance Pear, NP,as directed by  Nance Pear, NP while in the presence of Nance Pear, NP.   I, Nance Pear, NP, personally preformed the services described in this documentation.  All medical record entries made by the scribe were at my direction and in my presence.  I have reviewed the chart and discharge instructions (if applicable) and agree that the record reflects my personal performance and is accurate and complete. 05/18/22   Nance Pear, NP

## 2022-05-18 NOTE — Addendum Note (Signed)
Addended by: Kem Boroughs D on: 05/18/2022 04:33 PM   Modules accepted: Orders

## 2022-05-21 ENCOUNTER — Other Ambulatory Visit (INDEPENDENT_AMBULATORY_CARE_PROVIDER_SITE_OTHER): Payer: PPO

## 2022-05-21 DIAGNOSIS — K146 Glossodynia: Secondary | ICD-10-CM

## 2022-05-21 LAB — B12 AND FOLATE PANEL
Folate: >23.8 ng/mL (ref >5.9)
Vitamin B-12: 692 pg/mL (ref 211–911)

## 2022-05-21 NOTE — Progress Notes (Addendum)
Pt here for redraw of b12/folate. No charge today. I noticed future orders from 2023 for cbc and protein electrophoresis. Spoke with PCP and was advised those orders are not needed at this time and deleted future orders from 2023 as noted above. Completed b12/folate for today.

## 2022-05-21 NOTE — Addendum Note (Signed)
Addended by: Kelle Darting A on: 05/21/2022 01:28 PM   Modules accepted: Orders

## 2022-05-23 ENCOUNTER — Ambulatory Visit (INDEPENDENT_AMBULATORY_CARE_PROVIDER_SITE_OTHER): Payer: PPO | Admitting: Family Medicine

## 2022-05-23 ENCOUNTER — Encounter (INDEPENDENT_AMBULATORY_CARE_PROVIDER_SITE_OTHER): Payer: Self-pay | Admitting: Family Medicine

## 2022-05-23 VITALS — BP 133/71 | HR 66 | Temp 98.6°F | Ht 60.0 in | Wt 133.6 lb

## 2022-05-23 DIAGNOSIS — I10 Essential (primary) hypertension: Secondary | ICD-10-CM | POA: Diagnosis not present

## 2022-05-23 DIAGNOSIS — E559 Vitamin D deficiency, unspecified: Secondary | ICD-10-CM

## 2022-05-23 DIAGNOSIS — E669 Obesity, unspecified: Secondary | ICD-10-CM | POA: Diagnosis not present

## 2022-05-23 DIAGNOSIS — R7303 Prediabetes: Secondary | ICD-10-CM | POA: Diagnosis not present

## 2022-05-23 DIAGNOSIS — Z6826 Body mass index (BMI) 26.0-26.9, adult: Secondary | ICD-10-CM | POA: Diagnosis not present

## 2022-05-30 ENCOUNTER — Other Ambulatory Visit: Payer: Self-pay | Admitting: Family

## 2022-05-30 ENCOUNTER — Other Ambulatory Visit (HOSPITAL_BASED_OUTPATIENT_CLINIC_OR_DEPARTMENT_OTHER): Payer: Self-pay

## 2022-05-30 MED ORDER — HYDRALAZINE HCL 25 MG PO TABS
25.0000 mg | ORAL_TABLET | Freq: Three times a day (TID) | ORAL | 1 refills | Status: DC
  Filled 2022-05-30: qty 270, 90d supply, fill #0
  Filled 2022-08-30: qty 270, 90d supply, fill #1

## 2022-06-08 NOTE — Progress Notes (Unsigned)
Chief Complaint:   OBESITY [name] is here to discuss her progress with her obesity treatment plan along with follow-up of her obesity related diagnoses. [name] is on the Category 2 Plan with breakfast and lunch options and states she is following her eating plan approximately 96% of the time. [name] states she is walking 30 minutes 3 times per week.  Today's visit was #: 10 Starting weight: 170 lbs Starting date: 08/29/2021 Today's weight: 133 lbs Today's date: 05/23/2022 Total lbs lost to date: 37 Total lbs lost since last in-office visit: 1  Interim History: [name] is here for a follow up office visit.  We reviewed her meal plan and all questions were answered.  Patient's food recall appears to be accurate and consistent with what is on plan when she is following it.   When eating on plan, her hunger and cravings are well controlled.   [name] has no new goals for the new year, except her desire to buy new clothes and continue to improve her strength/functionality. She is a member at the [facility] currently but hasn't been going much. Pt used to love aqua therapy and aerobic classes.  Subjective:   1. Essential hypertension Asymptomatic. Pt has no concerns. Medication: Norvasc, hydralazine, Toprol  2. Prediabetes Discussed labs with patient today. Pt's A1c was 5.8 about 1-2 months ago. She denies hunger or cravings, and loves her meal plan and "healthy living."  3. Vitamin D deficiency Discussed labs with patient today. Recently, pt's Vitamin D level was 90.14 at OTC 5,000 IU daily at PCP office. Her PCP decreased supplement to 2,000 IU daily. Pt is asymptomatic.  Assessment/Plan:  No orders of the defined types were placed in this encounter.   There are no discontinued medications.   No orders of the defined types were placed in this encounter.    1. Essential hypertension BP at goal. Continue prudent nutritional plan and increase exercise.  2. Prediabetes No  need for meds. Continue prudent nutritional plan and increase exercise. Pt's A1c is 5.8 and stable at PCP's recently.  3. Vitamin D deficiency Continue weight bearing exercises and start resistance training. One density scan every 2 years per PCP. Continue OTC Vitamin D 2,000 IU and recheck level in 3 months. Counseling done.  4. Obesity, current BMI 26.1 [name] is currently in the action stage of change. As such, her goal is to continue with weight loss efforts. She has agreed to the Category 2 Plan with breakfast and lunch options.   [name] plans to go shopping for new clothes. She will invest in more time cross-training with weights at the [facility] or [facility] 3 days a week , in addition to her 30 minutes of walking 3 days a week.  Exercise goals: For substantial health benefits, adults should do at least 150 minutes (2 hours and 30 minutes) a week of moderate-intensity, or 75 minutes (1 hour and 15 minutes) a week of vigorous-intensity aerobic physical activity, or an equivalent combination of moderate- and vigorous-intensity aerobic activity. Aerobic activity should be performed in episodes of at least 10 minutes, and preferably, it should be spread throughout the week.  Behavioral modification strategies: increasing water intake and planning for success.  [name] has agreed to follow-up with our clinic in 4 weeks. She was informed of the importance of frequent follow-up visits to maximize her success with intensive lifestyle modifications for her multiple health conditions.   Objective:   Blood pressure 133/71, pulse 66, temperature 98.6  F (37 C), height 5' (1.524 m), weight 133 lb 9.6 oz (60.6 kg), last menstrual period 05/14/1968, SpO2 96 %. Body mass index is 26.09 kg/m.  General: Cooperative, alert, well developed, in no acute distress. HEENT: Conjunctivae and lids unremarkable. Cardiovascular: Regular rhythm.  Lungs: Normal work of breathing. Neurologic: No focal deficits.    Lab Results  Component Value Date   CREATININE 0.66 04/09/2022   BUN 10 04/09/2022   NA 130 (L) 04/09/2022   K 4.3 04/09/2022   CL 97 04/09/2022   CO2 24 04/09/2022   Lab Results  Component Value Date   ALT 13 04/09/2022   AST 16 04/09/2022   ALKPHOS 49 04/09/2022   BILITOT 0.5 04/09/2022   Lab Results  Component Value Date   HGBA1C 5.8 04/09/2022   HGBA1C 5.8 (H) 08/29/2021   HGBA1C 5.8 02/06/2021   HGBA1C 5.6 07/21/2019   HGBA1C 5.5 01/17/2018   Lab Results  Component Value Date   INSULIN 10.0 08/29/2021   Lab Results  Component Value Date   TSH 3.530 08/29/2021   Lab Results  Component Value Date   CHOL 233 (H) 04/09/2022   HDL 62.80 04/09/2022   LDLCALC 149 (H) 04/09/2022   LDLDIRECT 189.0 02/06/2021   TRIG 104.0 04/09/2022   CHOLHDL 4 04/09/2022   Lab Results  Component Value Date   VD25OH 90.14 04/09/2022   VD25OH 59.5 08/29/2021   VD25OH 49.28 08/01/2020   Lab Results  Component Value Date   WBC 12.3 (H) 09/05/2021   HGB 13.0 09/05/2021   HCT 38.9 09/05/2021   MCV 89.6 09/05/2021   PLT 384.0 09/05/2021   Attestation Statements:   Reviewed by clinician on day of visit: allergies, medications, problem list, medical history, surgical history, family history, social history, and previous encounter notes.  Time spent on visit including pre-visit chart review and post-visit care and charting was 30 minutes.   I, Kathlene November, BS, CMA, am acting as transcriptionist for [employer], DO.   I have reviewed the above documentation for accuracy and completeness, and I agree with the above. [name], D.O.  The [place] was signed into law in 2016 which includes the topic of electronic health records.  This provides immediate access to information in MyChart.  This includes consultation notes, operative notes, office notes, lab results and pathology reports.  If you have any questions about what you read please let us know  at your next visit so we can discuss your concerns and take corrective action if need be.  We are right here with you.

## 2022-06-21 ENCOUNTER — Ambulatory Visit (INDEPENDENT_AMBULATORY_CARE_PROVIDER_SITE_OTHER): Payer: PPO | Admitting: Family Medicine

## 2022-06-22 ENCOUNTER — Other Ambulatory Visit (HOSPITAL_BASED_OUTPATIENT_CLINIC_OR_DEPARTMENT_OTHER): Payer: Self-pay

## 2022-06-22 ENCOUNTER — Other Ambulatory Visit: Payer: Self-pay | Admitting: Family

## 2022-06-22 MED ORDER — EZETIMIBE 10 MG PO TABS
10.0000 mg | ORAL_TABLET | Freq: Every day | ORAL | 0 refills | Status: DC
  Filled 2022-06-22: qty 90, 90d supply, fill #0

## 2022-06-25 ENCOUNTER — Ambulatory Visit (INDEPENDENT_AMBULATORY_CARE_PROVIDER_SITE_OTHER): Payer: PPO | Admitting: Family Medicine

## 2022-06-25 ENCOUNTER — Encounter (INDEPENDENT_AMBULATORY_CARE_PROVIDER_SITE_OTHER): Payer: Self-pay | Admitting: Family Medicine

## 2022-06-25 VITALS — BP 129/72 | HR 65 | Temp 98.2°F | Ht 60.0 in | Wt 133.0 lb

## 2022-06-25 DIAGNOSIS — E669 Obesity, unspecified: Secondary | ICD-10-CM

## 2022-06-25 DIAGNOSIS — I10 Essential (primary) hypertension: Secondary | ICD-10-CM | POA: Diagnosis not present

## 2022-06-25 DIAGNOSIS — K5909 Other constipation: Secondary | ICD-10-CM

## 2022-06-25 DIAGNOSIS — Z6826 Body mass index (BMI) 26.0-26.9, adult: Secondary | ICD-10-CM

## 2022-06-25 DIAGNOSIS — E559 Vitamin D deficiency, unspecified: Secondary | ICD-10-CM

## 2022-07-05 ENCOUNTER — Other Ambulatory Visit (HOSPITAL_BASED_OUTPATIENT_CLINIC_OR_DEPARTMENT_OTHER): Payer: Self-pay

## 2022-07-05 ENCOUNTER — Other Ambulatory Visit: Payer: Self-pay | Admitting: Family

## 2022-07-05 MED ORDER — METOPROLOL SUCCINATE ER 50 MG PO TB24
75.0000 mg | ORAL_TABLET | Freq: Every day | ORAL | 1 refills | Status: DC
  Filled 2022-07-05: qty 135, 90d supply, fill #0
  Filled 2022-10-04: qty 135, 90d supply, fill #1

## 2022-07-05 NOTE — Progress Notes (Signed)
Chief Complaint:   OBESITY [name] is here to discuss her progress with her obesity treatment plan along with follow-up of her obesity related diagnoses. [name] is on the Category 2 Plan and states she is following her eating plan approximately 90% of the time. [name] states she is doing chair exercises 60 minutes 3 times per week.  Today's visit was #: 53 Starting weight: 170 LBS Starting date: 08/29/2021 Today's weight: 133 LBS Today's date: 06/25/2022 Total lbs lost to date: 37 LBS Total lbs lost since last in-office visit: 0  Interim History: Patient states she increased her resistance chair exercises and really would like to work on strengthening.  Patient asked about the proper way to get up from the floor, video show to the patient.  Subjective:   1. Essential hypertension Patient is asymptomatic tolerating medications well.  Patient has decreased salt intake and exercises regularly.  2. Other constipation Patient uses some stool softeners occasionally, but symptoms are very well-controlled.  Patient takes magnesium daily and does not have any concerns.  3. Vitamin D deficiency Last vitamin D level 90.14 about 2 months ago.  Patient is on vitamin D 2000 IU daily, OTC.  Assessment/Plan:  No orders of the defined types were placed in this encounter.   There are no discontinued medications.   No orders of the defined types were placed in this encounter.    1. Essential hypertension Patient's blood pressure at goal, patient denies dizziness, continue medications  2. Other constipation Continue current treatment regimen, continue exercise and properly hydrate.  3. Vitamin D deficiency  Recheck after 3 months on lower dose or at the next office visit.  4. Obesity, current BMI 26.0 Patient was given resistance bands and band it exercises.  Due to 2 sets of 10 3 days/week or more.  Practice getting up off the floor, patient was show the proper way to do so.  She will  also do modified plank exercises.  [name] is currently in the action stage of change. As such, her goal is to continue with weight loss efforts. She has agreed to the Category 2 Plan.   Exercise goals:  As is.  Behavioral modification strategies: planning for success.  [name] has agreed to follow-up with our clinic in 4 weeks. She was informed of the importance of frequent follow-up visits to maximize her success with intensive lifestyle modifications for her multiple health conditions.   Objective:   Blood pressure 129/72, pulse 65, temperature 98.2 F (36.8 C), height 5' (1.524 m), weight 133 lb (60.3 kg), last menstrual period 05/14/1968, SpO2 97 %. Body mass index is 25.97 kg/m.  General: Cooperative, alert, well developed, in no acute distress. HEENT: Conjunctivae and lids unremarkable. Cardiovascular: Regular rhythm.  Lungs: Normal work of breathing. Neurologic: No focal deficits.   Lab Results  Component Value Date   CREATININE 0.66 04/09/2022   BUN 10 04/09/2022   NA 130 (L) 04/09/2022   K 4.3 04/09/2022   CL 97 04/09/2022   CO2 24 04/09/2022   Lab Results  Component Value Date   ALT 13 04/09/2022   AST 16 04/09/2022   ALKPHOS 49 04/09/2022   BILITOT 0.5 04/09/2022   Lab Results  Component Value Date   HGBA1C 5.8 04/09/2022   HGBA1C 5.8 (H) 08/29/2021   HGBA1C 5.8 02/06/2021   HGBA1C 5.6 07/21/2019   HGBA1C 5.5 01/17/2018   Lab Results  Component Value Date   INSULIN 10.0 08/29/2021   Lab Results  Component Value Date   TSH 3.530 08/29/2021   Lab Results  Component Value Date   CHOL 233 (H) 04/09/2022   HDL 62.80 04/09/2022   LDLCALC 149 (H) 04/09/2022   LDLDIRECT 189.0 02/06/2021   TRIG 104.0 04/09/2022   CHOLHDL 4 04/09/2022   Lab Results  Component Value Date   VD25OH 90.14 04/09/2022   VD25OH 59.5 08/29/2021   VD25OH 49.28 08/01/2020   Lab Results  Component Value Date   WBC 12.3 (H) 09/05/2021   HGB 13.0 09/05/2021   HCT 38.9  09/05/2021   MCV 89.6 09/05/2021   PLT 384.0 09/05/2021   No results found for: "IRON", "TIBC", "FERRITIN"  Attestation Statements:   Reviewed by clinician on day of visit: allergies, medications, problem list, medical history, surgical history, family history, social history, and previous encounter notes.  I, Davy Pique, RMA, am acting as Location manager for [employer], DO.   I have reviewed the above documentation for accuracy and completeness, and I agree with the above. [name], D.O.  The [place] was signed into law in 2016 which includes the topic of electronic health records.  This provides immediate access to information in MyChart.  This includes consultation notes, operative notes, office notes, lab results and pathology reports.  If you have any questions about what you read please let us know at your next visit so we can discuss your concerns and take corrective action if need be.  We are right here with you.

## 2022-07-23 ENCOUNTER — Encounter (INDEPENDENT_AMBULATORY_CARE_PROVIDER_SITE_OTHER): Payer: Self-pay | Admitting: Family Medicine

## 2022-07-23 ENCOUNTER — Ambulatory Visit (INDEPENDENT_AMBULATORY_CARE_PROVIDER_SITE_OTHER): Payer: PPO | Admitting: Family Medicine

## 2022-07-23 VITALS — BP 119/70 | HR 64 | Temp 98.0°F | Ht 60.0 in | Wt 131.8 lb

## 2022-07-23 DIAGNOSIS — E669 Obesity, unspecified: Secondary | ICD-10-CM | POA: Diagnosis not present

## 2022-07-23 DIAGNOSIS — R7303 Prediabetes: Secondary | ICD-10-CM

## 2022-07-23 DIAGNOSIS — I10 Essential (primary) hypertension: Secondary | ICD-10-CM | POA: Diagnosis not present

## 2022-07-23 DIAGNOSIS — E559 Vitamin D deficiency, unspecified: Secondary | ICD-10-CM | POA: Diagnosis not present

## 2022-07-23 DIAGNOSIS — Z6825 Body mass index (BMI) 25.0-25.9, adult: Secondary | ICD-10-CM | POA: Diagnosis not present

## 2022-07-23 DIAGNOSIS — E7849 Other hyperlipidemia: Secondary | ICD-10-CM | POA: Diagnosis not present

## 2022-07-23 NOTE — Progress Notes (Signed)
[name], D.O.  ABFM, ABOM Specializing in Clinical Bariatric Medicine  Office located at: [address], [place]  [address]     Assessment and Plan:   Obesity, current BMI 25.7 Assessment: Condition is Improving, but not optimized.. Her goal weight is 130lbs. Biometric data collected today, was reviewed with patient.  Fat mass has decreased by 1lb, muscle mass has decreased by .2lb, and her total body water is unchanged. Her hunger and cravings have been well controlled. She has been following category 2 meal plan 90% of the time.  Plan: Continue category 2 meal plan and exercise regimen.   Prediabetes Assessment: Condition is Controlled.. Labs were reviewed. Diet controlled.  Lab Results  Component Value Date   HGBA1C 5.8 04/09/2022   HGBA1C 5.8 (H) 08/29/2021   HGBA1C 5.8 02/06/2021   INSULIN 10.0 08/29/2021   Plan:Continue prudent nutritional plan and exercise routine. PCP will recheck labs next week.   Vitamin D deficiency Assessment: Condition is At goal.. Labs were reviewed.  She states that her PCP plans to recheck her labs next week. Lab Results  Component Value Date   VD25OH 90.14 04/09/2022  She has been on Vitamin D 2000 daily.  Plan:Continue daily Vitamin D at current dose and follow up with PCP for labs.   Essential hypertension Assessment: Condition is At goal..  She has been compliant with Norvasc, Hydralazine, and Toprol XL. Her BP has been well controlled- she only checks her BP every now and then. BP Readings from Last 3 Encounters:  07/23/22 119/70  06/25/22 129/72  05/23/22 133/71   Plan:Advised to check her BP and heart rate once a week and provide log to PCP. Patient would like to decrease her BP medication regimen, so we discussed how this log may help her PCP make this decision.   Other hyperlipidemia Assessment: Condition is Not at goal.. Labs were reviewed.  She has been compliant with Zetia and Niacin. She  is also taking fish oil. Lab Results  Component Value Date   CHOL 233 (H) 04/09/2022   HDL 62.80 04/09/2022   LDLCALC 149 (H) 04/09/2022   LDLDIRECT 189.0 02/06/2021   TRIG 104.0 04/09/2022   CHOLHDL 4 04/09/2022   Plan:She will follow up with her PCP next week for routine lab work.     TREATMENT PLAN FOR OBESITY:  Recommended Dietary Goals [name] is currently in the action stage of change. As such, her goal is to continue weight management plan. She has agreed to continue the Category 2 Plan.  Behavioral Intervention We discussed the following Behavioral Modification Strategies today: increasing lean protein intake, decreasing simple carbohydrates , and work on meal planning and easy cooking plans. Additional resources provided today:  patient declined Evidence-based interventions for health behavior change were utilized today including the discussion of self monitoring techniques, problem-solving barriers and SMART goal setting techniques.   Regarding patient's less desirable eating habits and patterns, we employed the technique of small changes.  Pt will specifically work on: maintaining current meal plan and exercise regimen for next visit.    Recommended Physical Activity Goals [name] has been advised to work up to 150 minutes of moderate intensity aerobic activity a week and strengthening exercises 2-3 times per week for cardiovascular health, weight loss maintenance and preservation of muscle mass.  She has agreed to continue physical activity as is.    FOLLOW UP: No follow-ups on file.[name] She was informed of the importance of frequent follow up visits  to maximize her success with intensive lifestyle modifications for her multiple health conditions.  Weight Summary and Biometrics   Weight Lost Since Last Visit: 2lb  No data recorded  Vitals Temp: 98 F (36.7 C) BP: 119/70 Pulse Rate: 64 SpO2: 97 %   Anthropometric Measurements Height: 5' (1.524 m) Weight: 131  lb 12.8 oz (59.8 kg) BMI (Calculated): 25.74 Weight at Last Visit: 133lb Weight Lost Since Last Visit: 2lb Starting Weight: 170lb Total Weight Loss (lbs): 39 lb (17.7 kg) Peak Weight: 190lb   Body Composition  Body Fat %: 41.2 % Fat Mass (lbs): 54.4 lbs Muscle Mass (lbs): 73.4 lbs Total Body Water (lbs): 60.4 lbs Visceral Fat Rating : 12   Other Clinical Data Fasting: no Labs: no Today's Visit #: 12 Starting Date: 08/29/21    Subjective:   Chief complaint: Obesity [name] is here to discuss her progress with her obesity treatment plan. She is on the the Category 2 Plan and states she is following her eating plan approximately 90 % of the time. She states she is exercising 60 minutes 3 days per week.  Interval History:  Since last office visit she has been doing well with her meal plan. She has been measuring her foods more when preparing meals. She states that she has had success with her weight loss which she attributes to the nutritional counseling and structured meal plan.  She does her resistance chair exercises and resistance band exercises 3 days a week.  Review of Systems:  Pertinent positives were addressed with patient today.  Objective:   PHYSICAL EXAM:  Blood pressure 119/70, pulse 64, temperature 98 F (36.7 C), height 5' (1.524 m), weight 131 lb 12.8 oz (59.8 kg), last menstrual period 05/14/1968, SpO2 97 %. Body mass index is 25.74 kg/m.  General: Well Developed, well nourished, and in no acute distress.  HEENT: Normocephalic, atraumatic Skin: Warm and dry, cap RF less 2 sec, good turgor Chest:  Normal excursion, shape, no gross abn Respiratory: speaking in full sentences, no conversational dyspnea NeuroM-Sk: Ambulates w/o assistance, moves * 4 Psych: A and O *3, insight good, mood-full   DIAGNOSTIC DATA REVIEWED:  BMET    Component Value Date/Time   NA 130 (L) 04/09/2022 1022   NA 130 (L) 08/29/2021 1127   K 4.3 04/09/2022 1022   CL 97  04/09/2022 1022   CO2 24 04/09/2022 1022   GLUCOSE 92 04/09/2022 1022   BUN 10 04/09/2022 1022   BUN 14 08/29/2021 1127   CREATININE 0.66 04/09/2022 1022   CREATININE 0.72 02/01/2020 0834   CALCIUM 8.9 04/09/2022 1022   GFRNONAA 82 (L) 02/17/2014 0545   GFRNONAA 84 11/17/2013 0915   GFRAA >90 02/17/2014 0545   GFRAA >89 11/17/2013 0915   Lab Results  Component Value Date   HGBA1C 5.8 04/09/2022   HGBA1C 6.8 (H) 06/23/2009   Lab Results  Component Value Date   INSULIN 10.0 08/29/2021   Lab Results  Component Value Date   TSH 3.530 08/29/2021   CBC    Component Value Date/Time   WBC 12.3 (H) 09/05/2021 0927   RBC 4.35 09/05/2021 0927   HGB 13.0 09/05/2021 0927   HGB 13.5 08/29/2021 1127   HCT 38.9 09/05/2021 0927   HCT 39.8 08/29/2021 1127   PLT 384.0 09/05/2021 0927   PLT 530 (H) 08/29/2021 1127   MCV 89.6 09/05/2021 0927   MCV 88 08/29/2021 1127   MCH 29.7 08/29/2021 1127   MCH 29.3 02/17/2014 0545  MCHC 33.4 09/05/2021 0927   RDW 13.8 09/05/2021 0927   RDW 13.2 08/29/2021 1127   Iron Studies No results found for: "IRON", "TIBC", "FERRITIN", "IRONPCTSAT" Lipid Panel     Component Value Date/Time   CHOL 233 (H) 04/09/2022 1022   CHOL 273 (H) 08/29/2021 1127   TRIG 104.0 04/09/2022 1022   HDL 62.80 04/09/2022 1022   HDL 75 08/29/2021 1127   CHOLHDL 4 04/09/2022 1022   VLDL 20.8 04/09/2022 1022   LDLCALC 149 (H) 04/09/2022 1022   LDLCALC 168 (H) 08/29/2021 1127   LDLCALC 170 (H) 02/01/2020 0834   LDLDIRECT 189.0 02/06/2021 0909   Hepatic Function Panel     Component Value Date/Time   PROT 6.6 04/09/2022 1022   PROT 7.3 08/29/2021 1127   ALBUMIN 4.3 04/09/2022 1022   ALBUMIN 5.0 (H) 08/29/2021 1127   AST 16 04/09/2022 1022   ALT 13 04/09/2022 1022   ALKPHOS 49 04/09/2022 1022   BILITOT 0.5 04/09/2022 1022   BILITOT 0.3 08/29/2021 1127   BILIDIR 0.1 11/17/2013 0915   IBILI 0.3 11/17/2013 0915      Component Value Date/Time   TSH 3.530  08/29/2021 1127   Nutritional Lab Results  Component Value Date   VD25OH 90.14 04/09/2022   VD25OH 59.5 08/29/2021   VD25OH 49.28 08/01/2020    Attestations:   Reviewed by clinician on day of visit: allergies, medications, problem list, medical history, surgical history, family history, social history, and previous encounter notes.  I,Alexis Herring,acting as a Education administrator for [employer], DO.,have documented all relevant documentation on the behalf of Mellody Dance, DO,as directed by  Mellody Dance, DO while in the presence of Mellody Dance, DO.   I, Mellody Dance, DO, have reviewed all documentation for this visit. The documentation on 07/23/22 for the exam, diagnosis, procedures, and orders are all accurate and complete.

## 2022-07-26 ENCOUNTER — Other Ambulatory Visit (HOSPITAL_BASED_OUTPATIENT_CLINIC_OR_DEPARTMENT_OTHER): Payer: Self-pay

## 2022-07-26 ENCOUNTER — Other Ambulatory Visit (HOSPITAL_COMMUNITY): Payer: Self-pay

## 2022-07-26 NOTE — Telephone Encounter (Signed)
Prolia VOB initiated via [email or web address]  Last OV:  Next OV:  Last Prolia inj: 02/21/2022 Next Prolia inj DUE:  08/24/2022

## 2022-08-08 ENCOUNTER — Encounter: Payer: Self-pay | Admitting: Family

## 2022-08-08 ENCOUNTER — Ambulatory Visit (INDEPENDENT_AMBULATORY_CARE_PROVIDER_SITE_OTHER): Payer: PPO | Admitting: Family

## 2022-08-08 VITALS — BP 134/47 | HR 62 | Temp 98.2°F | Resp 16 | Wt 132.0 lb

## 2022-08-08 DIAGNOSIS — R7303 Prediabetes: Secondary | ICD-10-CM

## 2022-08-08 DIAGNOSIS — K2270 Barrett's esophagus without dysplasia: Secondary | ICD-10-CM | POA: Diagnosis not present

## 2022-08-08 DIAGNOSIS — E7849 Other hyperlipidemia: Secondary | ICD-10-CM

## 2022-08-08 DIAGNOSIS — K146 Glossodynia: Secondary | ICD-10-CM | POA: Diagnosis not present

## 2022-08-08 DIAGNOSIS — I10 Essential (primary) hypertension: Secondary | ICD-10-CM

## 2022-08-08 DIAGNOSIS — M810 Age-related osteoporosis without current pathological fracture: Secondary | ICD-10-CM

## 2022-08-08 DIAGNOSIS — E559 Vitamin D deficiency, unspecified: Secondary | ICD-10-CM | POA: Diagnosis not present

## 2022-08-08 LAB — LIPID PANEL
Cholesterol: 240 mg/dL — ABNORMAL HIGH (ref 0–200)
HDL: 68.2 mg/dL (ref >39.00)
LDL Cholesterol: 150 mg/dL — ABNORMAL HIGH (ref 0–99)
NonHDL: 171.78
Total CHOL/HDL Ratio: 4
Triglycerides: 111 mg/dL (ref 0.0–149.0)
VLDL: 22.2 mg/dL (ref 0.0–40.0)

## 2022-08-08 LAB — VITAMIN D 25 HYDROXY (VIT D DEFICIENCY, FRACTURES): VITD: 55.56 ng/mL (ref 30.00–100.00)

## 2022-08-08 LAB — HEMOGLOBIN A1C: Hgb A1c MFr Bld: 5.7 % (ref 4.6–6.5)

## 2022-08-08 LAB — MAGNESIUM: Magnesium: 2.1 mg/dL (ref 1.5–2.5)

## 2022-08-08 NOTE — Progress Notes (Signed)
Subjective:   By signing my name below, I, Shehryar Baig, attest that this documentation has been prepared under the direction and in the presence of [name], NP. 08/08/2022   Patient ID: [name], female    DOB: 12/19/1940, 82 y.o.   MRN: [id number]  Chief Complaint  Patient presents with   Hypertension    Here for follow up    HPI Patient is in today for a follow up visit.   Tongue: Her tongue improved since last visit. She continues having sensitivity on her tongue and is gargling salt water to help manage it. Her last B12 levels were normal.  Lab Results  Component Value Date   VITAMINB12 692 05/21/2022    Vitamin D: She has reduced her vitamin D levels to 2000 units due to her elevated vitamin D levels during her last blood work.   Weight: She reports losing weight since seeing a healthy weight and wellness clinic.  Wt Readings from Last 3 Encounters:  08/08/22 132 lb (59.9 kg)  07/23/22 131 lb 12.8 oz (59.8 kg)  06/25/22 133 lb (60.3 kg)   Blood pressure: Her blood pressure is doing well during this visit. She continues taking 25 mg hydralazine, 10 mg amlodipine, 50 mg metoprolol succinate and reports no new issues while taking it.  BP Readings from Last 3 Encounters:  08/08/22 (!) 134/47  07/23/22 119/70  06/25/22 129/72   Pulse Readings from Last 3 Encounters:  08/08/22 62  07/23/22 64  06/25/22 65   Cholesterol: Her last cholesterol levels were slightly elevated. She continues taking 10 mg Zetia daily PO. She is exercising regularly to help lower her cholesterol.  Lab Results  Component Value Date   CHOL 233 (H) 04/09/2022   HDL 62.80 04/09/2022   LDLCALC 149 (H) 04/09/2022   LDLDIRECT 189.0 02/06/2021   TRIG 104.0 04/09/2022   CHOLHDL 4 04/09/2022   Bone density: Last bone density was 08/08/2020. Results showed she is osteoporotic. She continues receiving prolia injections every 6 months.   Reflux: She continues taking 40 mg  omeprazole daily PO and reports no new reflux while taking it.    Past Medical History:  Diagnosis Date   Arthritis    osteoarthritis   Back pain    Barrett's esophagus    Cataract    bilateral cateracts removed   Chronic kidney disease    kidney stones   Chronic obstructive bronchitis    Dr Joya Gaskins   Colitis    Colon polyps    GERD (gastroesophageal reflux disease)    History of chicken pox    History of nephrolithiasis    History of transfusion of packed red blood cells    Hypercholesteremia    Hyperlipidemia    Hypertension    IFG (impaired fasting glucose)    Osteoarthritis    Osteoporosis    pt cannot tolerate fosamax   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   Swelling of both lower extremities    Urinary incontinence     Past Surgical History:  Procedure Laterality Date   [place]     LEG SURGERY  2008   right   TOTAL KNEE ARTHROPLASTY  04/2006   total left knee replacement    Family History  Problem Relation Age of Onset   Coronary artery disease Mother    Arthritis Mother    High  blood pressure Mother    Heart disease Mother    High blood pressure Father    Heart disease Father    Asthma Maternal Grandmother    Tuberculosis Other    Colon cancer Neg Hx    Esophageal cancer Neg Hx    Rectal cancer Neg Hx    Stomach cancer Neg Hx     Social History   Socioeconomic History   Marital status: Widowed    Spouse name: Not on file   Number of children: Not on file   Years of education: Not on file   Highest education level: 10th grade  Occupational History   Occupation: Retired  Tobacco Use   Smoking status: Former    Years: 15    Types: Cigarettes    Quit date: 05/15/1967    Years since quitting: 55.2   Smokeless tobacco: Never  Substance and Sexual Activity   Alcohol use: No   Drug use: No   Sexual activity: Never  Other Topics Concern    Not on file  Social History Narrative   Regular exercise: yes   Social Determinants of Health   Financial Resource Strain: Low Risk  (08/03/2022)   Overall Financial Resource Strain (CARDIA)    Difficulty of Paying Living Expenses: Not hard at all  Food Insecurity: No Food Insecurity (08/03/2022)   Hunger Vital Sign    Worried About Running Out of Food in the Last Year: Never true    [place] in the Last Year: Never true  Transportation Needs: No Transportation Needs (08/03/2022)   PRAPARE - Hydrologist (Medical): No    Lack of Transportation (Non-Medical): No  Physical Activity: Sufficiently Active (08/03/2022)   Exercise Vital Sign    Days of Exercise per Week: 3 days    Minutes of Exercise per Session: 60 min  Stress: No Stress Concern Present (08/03/2022)   [place]    Feeling of Stress : Not at all  Social Connections: Moderately Integrated (08/03/2022)   Social Connection and Isolation Panel [NHANES]    Frequency of Communication with Friends and Family: More than three times a week    Frequency of Social Gatherings with Friends and Family: Once a week    Attends Religious Services: More than 4 times per year    Active Member of [employer] or Organizations: Yes    Attends Archivist Meetings: More than 4 times per year    Marital Status: Widowed  Intimate Partner Violence: Not At Risk (09/14/2021)   Humiliation, Afraid, Rape, and Kick questionnaire    Fear of Current or Ex-Partner: No    Emotionally Abused: No    Physically Abused: No    Sexually Abused: No    Outpatient Medications Prior to Visit  Medication Sig Dispense Refill   amLODipine (NORVASC) 10 MG tablet Take 1 tablet (10 mg total) by mouth daily. 90 tablet 1   Ascorbic Acid (VITAMIN C) 1000 MG tablet Take 1,000 mg by mouth daily.     aspirin (ASPIRIN 81) 81 MG EC tablet Take 1 tablet (81 mg total) by mouth  daily. Swallow whole. 30 tablet 12   Calcium Carb-Cholecalciferol (CALCIUM 600+D) 600-10 MG-MCG TABS Take 600 mg by mouth daily.     carboxymethylcellulose (REFRESH PLUS) 0.5 % SOLN Place 1 drop into both eyes 2 (two) times daily.     Cholecalciferol (VITAMIN D3) 50 MCG (2000 UT) capsule Take  1 capsule (2,000 Units total) by mouth daily.     Coenzyme Q10 200 MG capsule Take 200 mg by mouth daily.     denosumab (PROLIA) 60 MG/ML SOSY injection Inject 60 mg into the skin every 6 (six) months.     ezetimibe (ZETIA) 10 MG tablet Take 1 tablet (10 mg total) by mouth daily. 90 tablet 0   hydrALAZINE (APRESOLINE) 25 MG tablet Take 1 tablet (25 mg total) by mouth 3 (three) times daily. 270 tablet 1   ibuprofen (ADVIL,MOTRIN) 200 MG tablet Take 400 mg by mouth daily.     Magnesium 400 MG TABS Take 400 mg by mouth daily.     metoprolol succinate (TOPROL-XL) 50 MG 24 hr tablet Take 1.5 tablets (75 mg total) by mouth daily.Take with or immediately following a meal. 135 tablet 1   Multiple Vitamin (MULTIVITAMIN) tablet Take 1 tablet by mouth daily.     niacin 500 MG tablet Take 500 mg by mouth at bedtime.     Omega-3 Fatty Acids (FISH OIL) 1000 MG CAPS Take 1 capsule by mouth every morning.     omeprazole (PRILOSEC) 40 MG capsule Take 1 capsule (40 mg total) by mouth daily. 90 capsule 1   RSV vaccine recomb adjuvanted (AREXVY) 120 MCG/0.5ML injection Inject into the muscle. 0.5 mL 0   No facility-administered medications prior to visit.    Allergies  Allergen Reactions   Atorvastatin Other (See Comments)    REACTION: MUSCLE PAINS   Diclofenac-Misoprostol Diarrhea and Nausea And Vomiting   Fenofibrate Other (See Comments)    REACTION: carpopedal spasms   Rosuvastatin Other (See Comments)    REACTION: MUSCLE PAINS   Statins Other (See Comments)    myalgias   Amoxicillin-Pot Clavulanate Other (See Comments)    REACTION: unspecified   Penicillins Hives   Sulfonamide Derivatives Hives   Tuberculin  Ppd Other (See Comments)    Redness at sight    ROS    See HPI Objective:    Physical Exam Constitutional:      General: She is not in acute distress.    Appearance: Normal appearance. She is not ill-appearing.  HENT:     Head: Normocephalic and atraumatic.     Right Ear: External ear normal.     Left Ear: External ear normal.  Eyes:     Extraocular Movements: Extraocular movements intact.     Pupils: Pupils are equal, round, and reactive to light.  Cardiovascular:     Rate and Rhythm: Normal rate and regular rhythm.     Heart sounds: Normal heart sounds. No murmur heard.    No gallop.  Pulmonary:     Effort: Pulmonary effort is normal. No respiratory distress.     Breath sounds: Normal breath sounds. No wheezing or rales.  Skin:    General: Skin is warm and dry.  Neurological:     Mental Status: She is alert and oriented to person, place, and time.  Psychiatric:        Judgment: Judgment normal.     BP (!) 134/47 (BP Location: Right Arm, Patient Position: Sitting, Cuff Size: Large)   Pulse 62   Temp 98.2 F (36.8 C) (Oral)   Resp 16   Wt 132 lb (59.9 kg)   LMP 05/14/1968   SpO2 99%   BMI 25.78 kg/m  Wt Readings from Last 3 Encounters:  08/08/22 132 lb (59.9 kg)  07/23/22 131 lb 12.8 oz (59.8 kg)  06/25/22 133 lb (60.3 kg)  Assessment & Plan:  Vitamin D deficiency Assessment & Plan: Stable on vit D 2000 iu daily.   Orders: -     VITAMIN D 25 Hydroxy (Vit-D Deficiency, Fractures)  Tongue pain Assessment & Plan: Improved, monitor.    Primary hypertension Assessment & Plan: BP Readings from Last 3 Encounters:  08/08/22 (!) 134/47  07/23/22 119/70  06/25/22 129/72   BP stable,continue amlodipine, hydralazine and metoprolol.   Prediabetes Assessment & Plan: Lab Results  Component Value Date   HGBA1C 5.8 04/09/2022   Wt Readings from Last 3 Encounters:  08/08/22 132 lb (59.9 kg)  07/23/22 131 lb 12.8 oz (59.8 kg)  06/25/22 133 lb  (60.3 kg)   Weight is improved. A1C was WNL last check.   Orders: -     Magnesium -     Hemoglobin A1c  Other hyperlipidemia Assessment & Plan: Lab Results  Component Value Date   CHOL 233 (H) 04/09/2022   HDL 62.80 04/09/2022   LDLCALC 149 (H) 04/09/2022   LDLDIRECT 189.0 02/06/2021   TRIG 104.0 04/09/2022   CHOLHDL 4 04/09/2022   Maintained on zetia.  She is exercising more regularly.   Orders: -     Lipid panel  Osteoporosis, unspecified osteoporosis type, unspecified pathological fracture presence Assessment & Plan: Continues vit D, calcium and Prolia. Will update dexa scan as it has been 2 years.  Orders: -     DG Bone Density; Future  Barrett's esophagus without dysplasia Assessment & Plan: Maintained on omeprazole 40mg  once daily.      I, Nance Pear, NP, personally preformed the services described in this documentation.  All medical record entries made by the scribe were at my direction and in my presence.  I have reviewed the chart and discharge instructions (if applicable) and agree that the record reflects my personal performance and is accurate and complete. 08/08/2022   I,Shehryar Baig,acting as a scribe for Nance Pear, NP.,have documented all relevant documentation on the behalf of Nance Pear, NP,as directed by  Nance Pear, NP while in the presence of Nance Pear, NP.   Nance Pear, NP

## 2022-08-08 NOTE — Assessment & Plan Note (Signed)
Lab Results  Component Value Date   CHOL 233 (H) 04/09/2022   HDL 62.80 04/09/2022   LDLCALC 149 (H) 04/09/2022   LDLDIRECT 189.0 02/06/2021   TRIG 104.0 04/09/2022   CHOLHDL 4 04/09/2022   Maintained on zetia.  She is exercising more regularly.

## 2022-08-08 NOTE — Assessment & Plan Note (Signed)
Improved, monitor.  

## 2022-08-08 NOTE — Assessment & Plan Note (Signed)
Continues vit D, calcium and Prolia. Will update dexa scan as it has been 2 years.

## 2022-08-08 NOTE — Assessment & Plan Note (Signed)
BP Readings from Last 3 Encounters:  08/08/22 (!) 134/47  07/23/22 119/70  06/25/22 129/72   BP stable,continue amlodipine, hydralazine and metoprolol.

## 2022-08-08 NOTE — Assessment & Plan Note (Signed)
Maintained on omeprazole 40mg  once daily.

## 2022-08-08 NOTE — Assessment & Plan Note (Signed)
>>  ASSESSMENT AND PLAN FOR OTHER HYPERLIPIDEMIA WRITTEN ON 08/08/2022 10:06 AM BY Sandford Craze, NP  Lab Results  Component Value Date   CHOL 233 (H) 04/09/2022   HDL 62.80 04/09/2022   LDLCALC 149 (H) 04/09/2022   LDLDIRECT 189.0 02/06/2021   TRIG 104.0 04/09/2022   CHOLHDL 4 04/09/2022   Maintained on zetia.  She is exercising more regularly.

## 2022-08-08 NOTE — Assessment & Plan Note (Signed)
Lab Results  Component Value Date   HGBA1C 5.8 04/09/2022   Wt Readings from Last 3 Encounters:  08/08/22 132 lb (59.9 kg)  07/23/22 131 lb 12.8 oz (59.8 kg)  06/25/22 133 lb (60.3 kg)   Weight is improved. A1C was WNL last check.

## 2022-08-08 NOTE — Assessment & Plan Note (Signed)
Stable on vit D 2000 iu daily.

## 2022-08-13 ENCOUNTER — Other Ambulatory Visit (HOSPITAL_BASED_OUTPATIENT_CLINIC_OR_DEPARTMENT_OTHER): Payer: Self-pay

## 2022-08-13 ENCOUNTER — Other Ambulatory Visit: Payer: Self-pay | Admitting: Family

## 2022-08-13 MED ORDER — OMEPRAZOLE 40 MG PO CPDR
40.0000 mg | DELAYED_RELEASE_CAPSULE | Freq: Every day | ORAL | 1 refills | Status: DC
  Filled 2022-08-13: qty 90, 90d supply, fill #0
  Filled 2022-11-07: qty 90, 90d supply, fill #1

## 2022-08-15 NOTE — Telephone Encounter (Signed)
Checking status.

## 2022-08-17 ENCOUNTER — Other Ambulatory Visit (HOSPITAL_COMMUNITY): Payer: Self-pay

## 2022-08-17 NOTE — Telephone Encounter (Signed)
Pt ready for scheduling for Prolia 60mg  on or after : 08/24/22  Out-of-pocket cost due at time of visit: $302  Primary: [employer] Prolia co-insurance: 20% Admin fee co-insurance: 0%  Secondary: --- Prolia co-insurance:  Admin fee co-insurance:   Medical Benefit Details: Date Benefits were checked: 07/26/22 Deductible: NO/ Coinsurance: 20%/ Admin Fee: 0%  Prior Auth: NOT REQUIRED PA# Expiration Date:    Pharmacy benefit: Copay $200 If patient wants fill through the pharmacy benefit please send prescription to:  --- , and include estimated need by date in rx notes. Pharmacy will ship medication directly to the office.  Patient NOT eligible for Prolia Copay Card. Copay Card can make patient's cost as little as $25. Link to apply: [email or web address]://www.[email or web address]  ** This summary of benefits is an estimation of the patient's out-of-pocket cost. Exact cost may very based on individual plan coverage.

## 2022-08-17 NOTE — Telephone Encounter (Signed)
20% Coinsurance NO deductible NO admin fee NO PA required

## 2022-08-20 NOTE — Telephone Encounter (Signed)
Patient would like to see if medication is actually working before putting more money into this.  Her bone density is scheduled for 4/25 and when she gets her results she will let us know if she wants to get this.  Advised that it will be cheaper at pharmacy.

## 2022-08-21 DIAGNOSIS — H35371 Puckering of macula, right eye: Secondary | ICD-10-CM | POA: Diagnosis not present

## 2022-08-21 DIAGNOSIS — H5202 Hypermetropia, left eye: Secondary | ICD-10-CM | POA: Diagnosis not present

## 2022-08-21 DIAGNOSIS — H18513 Endothelial corneal dystrophy, bilateral: Secondary | ICD-10-CM | POA: Diagnosis not present

## 2022-08-21 DIAGNOSIS — H524 Presbyopia: Secondary | ICD-10-CM | POA: Diagnosis not present

## 2022-08-21 DIAGNOSIS — H26493 Other secondary cataract, bilateral: Secondary | ICD-10-CM | POA: Diagnosis not present

## 2022-08-27 ENCOUNTER — Ambulatory Visit (INDEPENDENT_AMBULATORY_CARE_PROVIDER_SITE_OTHER): Payer: PPO | Admitting: Pharmacist

## 2022-08-27 DIAGNOSIS — I10 Essential (primary) hypertension: Secondary | ICD-10-CM

## 2022-08-27 DIAGNOSIS — M810 Age-related osteoporosis without current pathological fracture: Secondary | ICD-10-CM

## 2022-08-27 DIAGNOSIS — E7800 Pure hypercholesterolemia, unspecified: Secondary | ICD-10-CM

## 2022-08-27 DIAGNOSIS — E871 Hypo-osmolality and hyponatremia: Secondary | ICD-10-CM

## 2022-08-27 DIAGNOSIS — R7303 Prediabetes: Secondary | ICD-10-CM

## 2022-08-27 NOTE — Progress Notes (Signed)
Pharmacy Note  08/27/2022 Name: [name] MRN: [id number] DOB: 1940/06/19  Subjective: [name] is a 81 y.o. year old female who is a primary care patient of Sandford Craze, NP. Clinical Pharmacist Practitioner referral was placed to assist with medication management.    Engaged with patient by telephone for follow up visit today.  Hyperlipidemia:   Current therapy: ezetimibe  daily.  Previous medication tried: atorvastatin and rosuvastatin - both caused muscle pain; fenofibrate - topped due to carpopedal spasms.  Patient has pre diabetes and would like to see LDL < 100.  Diet: has almost eliminated sugar from diet. Eating lots of vegetables and lean protein.  She has changed diet significantly over the last 12 months and has lost about 30lbs.  Hypertension:  Current therapy: amlodipine  daily, hydralazine  3 times a day and metoprolol Er  - 1.5 tabs =  daily.  Past medications: tried lisinopril  but potasium increased so was stopped 11/07/2021.   BP Readings from Last 3 Encounters:  08/08/22 (!) 134/47  07/23/22 119/70  06/25/22 129/72   Hyponatremia - last serum sodium was 130 - still low but stable.  Pre - Diabetes: Last A1c 5.7% (08/08/2022). Following low sugar and low fat diet. Has lost about 30 lbs.  Osteoporosis: Received Prolia 02/21/2022. Taking calcium and vitamin D supplementation.  Patient was due to have Prolia injection 08/23/2022 but she is waiting until after her DEXA to decide if she will continue Prolia or not. She was concerned about the cost (increased for about $260 to $320 for physician buy and bill) Exercise: Weight and resistance exercise regularly - walking 10 to 15 minutes daily and doing resistance chair 3 times per week for 30 minutes.   Recent Visits:  08/08/2022 - Follow up with PCP (O'Sulliva, NP) Tongue continues to be sensitive but better. Gargling salt water.  07/23/2022 - [facility] (Dr  Sharee Holster)  06/25/2022 - [facility] Loss Clinic (Dr Sharee Holster)  05/23/2022 - [facility] Loss Clinic (Dr Sharee Holster)  05/18/2022 - Acute Visit with PCP Peggyann Juba, NP) for tongue pain. Checked B12 and folate levels to r/o B12 deficiency glossitis. B12 and folate were WNL. Prescribed nystatin suspension 4 times a day for 7 days.     Objective: Review of patient status, including review of consultants reports, laboratory and other test data, was performed as part of comprehensive evaluation and provision of chronic care management services.   Lab Results  Component Value Date   CREATININE 0.66 04/09/2022   CREATININE 0.70 11/22/2021   CREATININE 0.71 11/06/2021    Lab Results  Component Value Date   HGBA1C 5.7 08/08/2022       Component Value Date/Time   CHOL 240 (H) 08/08/2022 1012   CHOL 273 (H) 08/29/2021 1127   TRIG 111.0 08/08/2022 1012   HDL 68.20 08/08/2022 1012   HDL 75 08/29/2021 1127   CHOLHDL 4 08/08/2022 1012   VLDL 22.2 08/08/2022 1012   LDLCALC 150 (H) 08/08/2022 1012   LDLCALC 168 (H) 08/29/2021 1127   LDLCALC 170 (H) 02/01/2020 0834   LDLDIRECT 189.0 02/06/2021 0909     Clinical ASCVD: No  The ASCVD Risk score (Arnett DK, et al., 2019) failed to calculate for the following reasons:   The 2019 ASCVD risk score is only valid for ages 83 to 7    BP Readings from Last 3 Encounters:  08/08/22 (!) 134/47  07/23/22 119/70  06/25/22 129/72     Allergies  Allergen Reactions  Atorvastatin Other (See Comments)    REACTION: MUSCLE PAINS   Diclofenac-Misoprostol Diarrhea and Nausea And Vomiting   Fenofibrate Other (See Comments)    REACTION: carpopedal spasms   Rosuvastatin Other (See Comments)    REACTION: MUSCLE PAINS   Statins Other (See Comments)    myalgias   Amoxicillin-Pot Clavulanate Other (See Comments)    REACTION: unspecified   Penicillins Hives   Sulfonamide Derivatives Hives   Tuberculin Ppd Other (See Comments)    Redness at sight    Medications  Reviewed Today     Reviewed by Wilford Corner, CMA (Certified Medical Assistant) on 08/08/22 at [phone]  Med List Status: <None>   Medication Order Taking? Sig Documenting Provider Last Dose Status Informant  amLODipine (NORVASC) 10 MG tablet [id number] Yes Take 1 tablet (10 mg total) by mouth daily. Sandford Craze, NP Taking Active   Ascorbic Acid (VITAMIN C) 1000 MG tablet [id number] Yes Take 1,000 mg by mouth daily. [provider] Taking Active Self  aspirin (ASPIRIN 81) 81 MG EC tablet [id number] Yes Take 1 tablet (81 mg total) by mouth daily. Swallow whole. Sandford Craze, NP Taking Active   Calcium Carb-Cholecalciferol (CALCIUM 600+D) 600-10 MG-MCG TABS [id number] Yes Take 600 mg by mouth daily. [provider] Taking Active   carboxymethylcellulose (REFRESH PLUS) 0.5 % SOLN [id number] Yes Place 1 drop into both eyes 2 (two) times daily. [provider] Taking Active Self  Cholecalciferol (VITAMIN D3) 50 MCG (2000 UT) capsule [id number] Yes Take 1 capsule (2,000 Units total) by mouth daily. Sandford Craze, NP Taking Active   Coenzyme Q10 200 MG capsule [id number] Yes Take 200 mg by mouth daily. [provider] Taking Active Self  denosumab (PROLIA) 60 MG/ML SOSY injection [id number] Yes Inject 60 mg into the skin every 6 (six) months. [provider] Taking Active   ezetimibe (ZETIA) 10 MG tablet [id number] Yes Take 1 tablet (10 mg total) by mouth daily. Sandford Craze, NP Taking Active   hydrALAZINE (APRESOLINE) 25 MG tablet [id number] Yes Take 1 tablet (25 mg total) by mouth 3 (three) times daily. Sandford Craze, NP Taking Active   ibuprofen (ADVIL,MOTRIN) 200 MG tablet [id number] Yes Take 400 mg by mouth daily. [provider] Taking Active   Magnesium 400 MG TABS [id number] Yes Take 400 mg by mouth daily. [provider] Taking Active   metoprolol succinate (TOPROL-XL) 50 MG 24 hr tablet [id number] Yes Take 1.5 tablets  (75 mg total) by mouth daily.Take with or immediately following a meal. Sandford Craze, NP Taking Active   Multiple Vitamin (MULTIVITAMIN) tablet [id number] Yes Take 1 tablet by mouth daily. [provider] Taking Active Self  niacin 500 MG tablet [id number] Yes Take 500 mg by mouth at bedtime. [provider] Taking Active Self           Med Note Katrinka Blazing   Fri Apr 29, 2020  9:20 AM)    Omega-3 Fatty Acids (FISH OIL) 1000 MG CAPS [id number] Yes Take 1 capsule by mouth every morning. [provider] Taking Active Self  omeprazole (PRILOSEC) 40 MG capsule [id number] Yes Take 1 capsule (40 mg total) by mouth daily. Sandford Craze, NP Taking Active   RSV vaccine recomb adjuvanted (AREXVY) 120 MCG/0.5ML injection [id number] Yes Inject into the muscle. Judyann Munson, MD Taking Active             Patient Active Problem List   Diagnosis Date Noted   BMI 26.0-26.9,adult-current bmi 26.0 06/25/2022   Primary  hypertension 06/25/2022   Other constipation 06/25/2022   Tongue pain 05/18/2022   Other hyperlipidemia 05/02/2022   Class 1 obesity with serious comorbidity and body mass index (BMI) of 33.0 to 33.9 in adult 05/02/2022   Hyperkalemia 11/22/2021   Prediabetes 10/12/2021   Vitamin D deficiency 10/12/2021   Leukocytosis 09/05/2021   Hyperalbuminemia 09/05/2021   Mixed hyperlipidemia 08/29/2021   Lumbar radiculopathy, chronic 08/25/2021   Preventative health care 12/14/2014   Hyponatremia 02/20/2014   Hypokalemia 02/20/2014   Hyperglycemia 11/17/2013   Need for prophylactic vaccination and inoculation against influenza 02/08/2012   HYPERCHOLESTEROLEMIA 04/01/2008   BARRETTS ESOPHAGUS 04/01/2008   OSTEOARTHRITIS 04/01/2008   URINARY INCONTINENCE 04/01/2008   TOBACCO ABUSE, HX OF 04/01/2008   GERD 09/24/2007   Essential hypertension 04/01/2007   Osteoporosis 05/31/2006   COLONIC POLYPS, HX OF 05/31/2006   NEPHROLITHIASIS, HX OF 05/31/2006      Medication Assistance:  None required.  Patient affirms current coverage meets needs.   Assessment / Plan: Hyperlipidemia: last LDL and Tg were not at goal Continue ezetimibe  daily.  Could consider Nexlizet (combo of Nexlitol and ezetimibe). Would need prior authorization and is tier 4 with her current insurance (copay would be $100 / 30 days or $200 / 100 days but we could see if she would qualify for [employer]). Patient would like to delay decision until she decides what she will do about Prolia. (Prolia + Nexlizet would likely cause her to reach Medicare coverage gap)  Hypertension: blood pressure < 140/90 and stable.  Continue amlodipine  daily, hydralazine  3 times a day and metoprolol ER  - 1.5 tabs =  daily.  Hyponatremia - last serum sodium was 130- still low but stable.  Continue to monitor and avoid medications that lower sodium level.  Pre - Diabetes: Last A1c at goal of < 5.8 Continue to follow up with weight management clinic. Patient is doing great.  Continue low sugar and low fat diet.  Continue exercise.  Osteoporosis:  Continue Prolia past due but will delay until after DEXA scheduled for 09/06/2022.  Discussed that she could get Prolia thru her pharmacy benefits and cost would be $200 per injection instead of $320. Performed med assessment on Medicare.gov and even adding Prolia to pharmacy claims 2 times per year would not put her in Medicare coverage gap for 2024 (using current med list)  Continue calcium and vitamin D supplementation. Continue weight bearing and resistance exercise  Follow Up:  Telephone follow up appointment with care management team member scheduled for:  6 months   Henrene Pastor, PharmD Clinical Pharmacist [facility]  - [facility] [phone]

## 2022-09-03 ENCOUNTER — Ambulatory Visit (INDEPENDENT_AMBULATORY_CARE_PROVIDER_SITE_OTHER): Payer: PPO | Admitting: Family Medicine

## 2022-09-03 ENCOUNTER — Encounter (INDEPENDENT_AMBULATORY_CARE_PROVIDER_SITE_OTHER): Payer: Self-pay | Admitting: Family Medicine

## 2022-09-03 VITALS — BP 134/67 | HR 64 | Temp 97.5°F | Ht 60.0 in | Wt 128.0 lb

## 2022-09-03 DIAGNOSIS — Z6825 Body mass index (BMI) 25.0-25.9, adult: Secondary | ICD-10-CM

## 2022-09-03 DIAGNOSIS — E559 Vitamin D deficiency, unspecified: Secondary | ICD-10-CM

## 2022-09-03 DIAGNOSIS — E782 Mixed hyperlipidemia: Secondary | ICD-10-CM

## 2022-09-03 DIAGNOSIS — E66811 Obesity, class 1: Secondary | ICD-10-CM

## 2022-09-03 DIAGNOSIS — E669 Obesity, unspecified: Secondary | ICD-10-CM | POA: Diagnosis not present

## 2022-09-03 DIAGNOSIS — R7303 Prediabetes: Secondary | ICD-10-CM

## 2022-09-03 NOTE — Progress Notes (Signed)
[name], D.O.  ABFM, ABOM Specializing in Clinical Bariatric Medicine  Office located at: [address] [place], [place]  [address]     Assessment and Plan:   No orders of the defined types were placed in this encounter.   There are no discontinued medications.   No orders of the defined types were placed in this encounter.   Prediabetes Assessment: Condition is stable. Labs from PCP on 08/08/2022 were reviewed.  Lab Results  Component Value Date   HGBA1C 5.7 08/08/2022   HGBA1C 5.8 04/09/2022   HGBA1C 5.8 (H) 08/29/2021   INSULIN 10.0 08/29/2021  Her A1c has improved from 5.8 on 04/09/2022 to 5.7 on 08/08/2022. Patient is currently not on any prediabetic medication. This is diet controlled.   Plan - Continue to decrease simple carbs/ sugars; increase fiber and proteins -> follow her meal plan.   - Darra will continue to work on weight loss, exercise, via their meal plan we devised to help decrease the risk of progressing to diabetes.    Vitamin D deficiency Assessment: Condition is stable. Labs from PCP were reviewd.  Lab Results  Component Value Date   VD25OH 55.56 08/08/2022   VD25OH 90.14 04/09/2022   VD25OH 59.5 08/29/2021  Patient's Vitamin D levels are within the recommended range of 50-100. She is compliant with OTC Vitamin D 2,000 UT   Plan:Continue with med as recommended by PCP - weight loss will likely improve availability of vitamin D, thus encouraged Jody to continue with meal plan and their weight loss efforts to further improve this condition.  Thus, we will need to monitor levels regularly (every 3-4 mo on average) to keep levels within normal limits and prevent over supplementation.   Mixed hyperlipidemia Assessment: Condition is Not optimized.. Labs were reviewed.  Lab Results  Component Value Date   CHOL 240 (H) 08/08/2022   HDL 68.20 08/08/2022   LDLCALC 150 (H) 08/08/2022   LDLDIRECT 189.0 02/06/2021   TRIG 111.0 08/08/2022    CHOLHDL 4 08/08/2022  Patient is compliant with Zetia 10 mg daily. Denies any side effects.In the past, patient has been on statins, however it caused her pain in her muscles and she was not able to walk. Her elevated cholesterol and LDLCALC levels are likely due to genetics.   Plan: Continue with med as recommended by PCP.  - I recommended her to discuss with a cardiologist about possible injections to manage  her elevated CHOL and LDL CALC levels. She declined for now, but may consider in the future.  -Continue her prudent nutritional plan that is low in simple carbohydrates, saturated fats and trans fats to goal of 5-10% weight loss to achieve significant health benefits.  Pt encouraged to continually advance exercise and cardiovascular fitness as tolerated throughout weight loss journey.      TREATMENT PLAN FOR OBESITY: Obesity, current BMI 25.7 Assessment: Condition is improving but not optimized. Biometric data collected today, was reviewed with patient.  Fat mass has decreased by 3.2lb. Muscle mass has decreased by 0.6lb. Total body water has decreased by 2.6lb.   Plan:  Merrilee is currently in the action stage of change. As such, her goal is to continue weight management plan. Sanye will work on healthier eating habits and continuing the Category 2 meal plan.  I counseled patient on ways that she can increase her whole grain and healthy fat consumption. I reccommended her to incorporate balance and flexibility exercises into her exercise regiment.  Behavioral Intervention Additional resources provided today:  [employer] handout Evidence-based interventions for health behavior change were utilized today including the discussion of self monitoring techniques, problem-solving barriers and SMART goal setting techniques.   Regarding patient's less desirable eating habits and patterns, we employed the technique of small changes.  Pt will specifically work on: Increasing consumption of whole  grains and healthy fats (nuts and fish) and working on strength, balance, and flexibility exercises.   for next visit.     Recommended Physical Activity Goals Trachelle has been advised to work up to 150 minutes of moderate intensity aerobic activity a week and strengthening exercises 2-3 times per week for cardiovascular health, weight loss maintenance and preservation of muscle mass.  She has agreed to Think about ways to increase physical activity   FOLLOW UP: Return in about 6 weeks (around 10/15/2022). She was informed of the importance of frequent follow up visits to maximize her success with intensive lifestyle modifications for her multiple health conditions.  Subjective:   Chief complaint: Obesity [name] is here to discuss her progress with her obesity treatment plan. She is on the the Category 2 Plan and states she is following her eating plan approximately 95% of the time. She states she is doing resistance chair/walking 15-160 minutes 3 days per week.  Interval History:  [name] is here for a follow up office visit. Since last office visit she has been mostly following the meal plan. Patient's food recall appears to be accurate and consistent with what is on plan when she is following it. When eating on plan, her hunger and cravings are well controlled.     On 08/08/22 she had labs with her PCP [name], which we reviewed today.     Review of Systems:  Pertinent positives were addressed with patient today.  Weight Summary and Biometrics   Weight Lost Since Last Visit: 3lb  Weight Gained Since Last Visit: 0    Vitals Temp: (!) 97.5 F (36.4 C) BP: 134/67 Pulse Rate: 64 SpO2: 97 %   Anthropometric Measurements Height: 5' (1.524 m) Weight: 128 lb (58.1 kg) BMI (Calculated): 25 Weight at Last Visit: 131lb Weight Lost Since Last Visit: 3lb Weight Gained Since Last Visit: 0 Starting Weight: 170lb Total Weight Loss (lbs): 42 lb (19.1 kg) Peak Weight:  190lb   Body Composition  Body Fat %: 40 % Fat Mass (lbs): 51.2 lbs Muscle Mass (lbs): 72.8 lbs Total Body Water (lbs): 57.8 lbs Visceral Fat Rating : 11   Other Clinical Data Fasting: no Labs: no Today's Visit #: 13 Starting Date: 08/29/21     Objective:   PHYSICAL EXAM: Blood pressure 134/67, pulse 64, temperature (!) 97.5 F (36.4 C), height 5' (1.524 m), weight 128 lb (58.1 kg), last menstrual period 05/14/1968, SpO2 97 %. Body mass index is 25 kg/m.  General: Well Developed, well nourished, and in no acute distress.  HEENT: Normocephalic, atraumatic Skin: Warm and dry, cap RF less 2 sec, good turgor Chest:  Normal excursion, shape, no gross abn Respiratory: speaking in full sentences, no conversational dyspnea NeuroM-Sk: Ambulates w/o assistance, moves * 4 Psych: A and O *3, insight good, mood-full  DIAGNOSTIC DATA REVIEWED:  BMET    Component Value Date/Time   NA 130 (L) 04/09/2022 1022   NA 130 (L) 08/29/2021 1127   K 4.3 04/09/2022 1022   CL 97 04/09/2022 1022   CO2 24 04/09/2022 1022   GLUCOSE 92 04/09/2022 1022   BUN 10 04/09/2022  1022   BUN 14 08/29/2021 1127   CREATININE 0.66 04/09/2022 1022   CREATININE 0.72 02/01/2020 0834   CALCIUM 8.9 04/09/2022 1022   GFRNONAA 82 (L) 02/17/2014 0545   GFRNONAA 84 11/17/2013 0915   GFRAA >90 02/17/2014 0545   GFRAA >89 11/17/2013 0915   Lab Results  Component Value Date   HGBA1C 5.7 08/08/2022   HGBA1C 6.8 (H) 06/23/2009   Lab Results  Component Value Date   INSULIN 10.0 08/29/2021   Lab Results  Component Value Date   TSH 3.530 08/29/2021   CBC    Component Value Date/Time   WBC 12.3 (H) 09/05/2021 0927   RBC 4.35 09/05/2021 0927   HGB 13.0 09/05/2021 0927   HGB 13.5 08/29/2021 1127   HCT 38.9 09/05/2021 0927   HCT 39.8 08/29/2021 1127   PLT 384.0 09/05/2021 0927   PLT 530 (H) 08/29/2021 1127   MCV 89.6 09/05/2021 0927   MCV 88 08/29/2021 1127   MCH 29.7 08/29/2021 1127   MCH 29.3  02/17/2014 0545   MCHC 33.4 09/05/2021 0927   RDW 13.8 09/05/2021 0927   RDW 13.2 08/29/2021 1127   Iron Studies No results found for: "IRON", "TIBC", "FERRITIN", "IRONPCTSAT" Lipid Panel     Component Value Date/Time   CHOL 240 (H) 08/08/2022 1012   CHOL 273 (H) 08/29/2021 1127   TRIG 111.0 08/08/2022 1012   HDL 68.20 08/08/2022 1012   HDL 75 08/29/2021 1127   CHOLHDL 4 08/08/2022 1012   VLDL 22.2 08/08/2022 1012   LDLCALC 150 (H) 08/08/2022 1012   LDLCALC 168 (H) 08/29/2021 1127   LDLCALC 170 (H) 02/01/2020 0834   LDLDIRECT 189.0 02/06/2021 0909   Hepatic Function Panel     Component Value Date/Time   PROT 6.6 04/09/2022 1022   PROT 7.3 08/29/2021 1127   ALBUMIN 4.3 04/09/2022 1022   ALBUMIN 5.0 (H) 08/29/2021 1127   AST 16 04/09/2022 1022   ALT 13 04/09/2022 1022   ALKPHOS 49 04/09/2022 1022   BILITOT 0.5 04/09/2022 1022   BILITOT 0.3 08/29/2021 1127   BILIDIR 0.1 11/17/2013 0915   IBILI 0.3 11/17/2013 0915      Component Value Date/Time   TSH 3.530 08/29/2021 1127   Nutritional Lab Results  Component Value Date   VD25OH 55.56 08/08/2022   VD25OH 90.14 04/09/2022   VD25OH 59.5 08/29/2021    Attestations:   Reviewed by clinician on day of visit: allergies, medications, problem list, medical history, surgical history, family history, social history, and previous encounter notes.   I,Special Puri,acting as a Neurosurgeon for [employer], DO.,have documented all relevant documentation on the behalf of Thomasene Lot, DO,as directed by  Thomasene Lot, DO while in the presence of Thomasene Lot, DO.   I, Thomasene Lot, DO, have reviewed all documentation for this visit. The documentation on 09/03/22 for the exam, diagnosis, procedures, and orders are all accurate and complete.

## 2022-09-04 ENCOUNTER — Telehealth: Payer: Self-pay | Admitting: Family

## 2022-09-04 NOTE — Telephone Encounter (Signed)
Contacted [name] to schedule their annual wellness visit. Appointment made for 09/19/2022.  [name]; Care Guide Ambulatory Clinical Support [facility] Direct Dial: [phone]

## 2022-09-06 ENCOUNTER — Ambulatory Visit (HOSPITAL_BASED_OUTPATIENT_CLINIC_OR_DEPARTMENT_OTHER)
Admission: RE | Admit: 2022-09-06 | Discharge: 2022-09-06 | Disposition: A | Discharge status: Home / Self Care | Payer: PPO | Source: Ambulatory Visit | Attending: Family | Admitting: Family

## 2022-09-06 DIAGNOSIS — M810 Age-related osteoporosis without current pathological fracture: Secondary | ICD-10-CM | POA: Insufficient documentation

## 2022-09-18 ENCOUNTER — Other Ambulatory Visit: Payer: Self-pay | Admitting: Family

## 2022-09-18 ENCOUNTER — Other Ambulatory Visit (HOSPITAL_BASED_OUTPATIENT_CLINIC_OR_DEPARTMENT_OTHER): Payer: Self-pay

## 2022-09-18 MED ORDER — EZETIMIBE 10 MG PO TABS
10.0000 mg | ORAL_TABLET | Freq: Every day | ORAL | 1 refills | Status: DC
  Filled 2022-09-18: qty 90, 90d supply, fill #0
  Filled 2022-12-14: qty 90, 90d supply, fill #1

## 2022-09-19 ENCOUNTER — Ambulatory Visit (INDEPENDENT_AMBULATORY_CARE_PROVIDER_SITE_OTHER): Payer: PPO | Admitting: *Deleted

## 2022-09-19 DIAGNOSIS — Z0000 Encounter for general adult medical examination without abnormal findings: Secondary | ICD-10-CM

## 2022-09-19 NOTE — Patient Instructions (Signed)
[name] , Thank you for taking time to come for your Medicare Wellness Visit. I appreciate your ongoing commitment to your health goals. Please review the following plan we discussed and let me know if I can assist you in the future.     This is a list of the screening recommended for you and due dates:  Health Maintenance  Topic Date Due   Flu Shot  12/13/2022   Hemoglobin A1C  02/08/2023   Yearly kidney function blood test for diabetes  04/10/2023   Yearly kidney health urinalysis for diabetes  04/10/2023   Medicare Annual Wellness Visit  09/19/2023   DTaP/Tdap/Td vaccine (4 - Td or Tdap) 07/13/2030   Pneumonia Vaccine  Completed   DEXA scan (bone density measurement)  Completed   COVID-19 Vaccine  Completed   Zoster (Shingles) Vaccine  Completed   HPV Vaccine  Aged Out   Complete foot exam   Discontinued   Eye exam for diabetics  Discontinued    Next appointment: Follow up in one year for your annual wellness visit.   Preventive Care 82 Years and Older, Female Preventive care refers to lifestyle choices and visits with your health care provider that can promote health and wellness. What does preventive care include? A yearly physical exam. This is also called an annual well check. Dental exams once or twice a year. Routine eye exams. Ask your health care provider how often you should have your eyes checked. Personal lifestyle choices, including: Daily care of your teeth and gums. Regular physical activity. Eating a healthy diet. Avoiding tobacco and drug use. Limiting alcohol use. Practicing safe sex. Taking low-dose aspirin every day. Taking vitamin and mineral supplements as recommended by your health care provider. What happens during an annual well check? The services and screenings done by your health care provider during your annual well check will depend on your age, overall health, lifestyle risk factors, and family history of disease. Counseling  Your  health care provider may ask you questions about your: Alcohol use. Tobacco use. Drug use. Emotional well-being. Home and relationship well-being. Sexual activity. Eating habits. History of falls. Memory and ability to understand (cognition). Work and work Astronomer. Reproductive health. Screening  You may have the following tests or measurements: Height, weight, and BMI. Blood pressure. Lipid and cholesterol levels. These may be checked every 5 years, or more frequently if you are over 82 years old. Skin check. Lung cancer screening. You may have this screening every year starting at age 82 if you have a 30-pack-year history of smoking and currently smoke or have quit within the past 15 years. Fecal occult blood test (FOBT) of the stool. You may have this test every year starting at age 82. Flexible sigmoidoscopy or colonoscopy. You may have a sigmoidoscopy every 5 years or a colonoscopy every 10 years starting at age 82. Hepatitis C blood test. Hepatitis B blood test. Sexually transmitted disease (STD) testing. Diabetes screening. This is done by checking your blood sugar (glucose) after you have not eaten for a while (fasting). You may have this done every 1-3 years. Bone density scan. This is done to screen for osteoporosis. You may have this done starting at age 82. Mammogram. This may be done every 1-2 years. Talk to your health care provider about how often you should have regular mammograms. Talk with your health care provider about your test results, treatment options, and if necessary, the need for more tests. Vaccines  Your health care provider  may recommend certain vaccines, such as: Influenza vaccine. This is recommended every year. Tetanus, diphtheria, and acellular pertussis (Tdap, Td) vaccine. You may need a Td booster every 10 years. Zoster vaccine. You may need this after age 82. Pneumococcal 13-valent conjugate (PCV13) vaccine. One dose is recommended after age  82. Pneumococcal polysaccharide (PPSV23) vaccine. One dose is recommended after age 82. Talk to your health care provider about which screenings and vaccines you need and how often you need them. This information is not intended to replace advice given to you by your health care provider. Make sure you discuss any questions you have with your health care provider. Document Released: 05/27/2015 Document Revised: 01/18/2016 Document Reviewed: 03/01/2015 Elsevier Interactive Patient Education  2017 [place] Prevention in the Home Falls can cause injuries. They can happen to people of all ages. There are many things you can do to make your home safe and to help prevent falls. What can I do on the outside of my home? Regularly fix the edges of walkways and driveways and fix any cracks. Remove anything that might make you trip as you walk through a door, such as a raised step or threshold. Trim any bushes or trees on the path to your home. Use bright outdoor lighting. Clear any walking paths of anything that might make someone trip, such as rocks or tools. Regularly check to see if handrails are loose or broken. Make sure that both sides of any steps have handrails. Any raised decks and porches should have guardrails on the edges. Have any leaves, snow, or ice cleared regularly. Use sand or salt on walking paths during winter. Clean up any spills in your garage right away. This includes oil or grease spills. What can I do in the bathroom? Use night lights. Install grab bars by the toilet and in the tub and shower. Do not use towel bars as grab bars. Use non-skid mats or decals in the tub or shower. If you need to sit down in the shower, use a plastic, non-slip stool. Keep the floor dry. Clean up any water that spills on the floor as soon as it happens. Remove soap buildup in the tub or shower regularly. Attach bath mats securely with double-sided non-slip rug tape. Do not have throw  rugs and other things on the floor that can make you trip. What can I do in the bedroom? Use night lights. Make sure that you have a light by your bed that is easy to reach. Do not use any sheets or blankets that are too big for your bed. They should not hang down onto the floor. Have a firm chair that has side arms. You can use this for support while you get dressed. Do not have throw rugs and other things on the floor that can make you trip. What can I do in the kitchen? Clean up any spills right away. Avoid walking on wet floors. Keep items that you use a lot in easy-to-reach places. If you need to reach something above you, use a strong step stool that has a grab bar. Keep electrical cords out of the way. Do not use floor polish or wax that makes floors slippery. If you must use wax, use non-skid floor wax. Do not have throw rugs and other things on the floor that can make you trip. What can I do with my stairs? Do not leave any items on the stairs. Make sure that there are handrails on both sides  of the stairs and use them. Fix handrails that are broken or loose. Make sure that handrails are as long as the stairways. Check any carpeting to make sure that it is firmly attached to the stairs. Fix any carpet that is loose or worn. Avoid having throw rugs at the top or bottom of the stairs. If you do have throw rugs, attach them to the floor with carpet tape. Make sure that you have a light switch at the top of the stairs and the bottom of the stairs. If you do not have them, ask someone to add them for you. What else can I do to help prevent falls? Wear shoes that: Do not have high heels. Have rubber bottoms. Are comfortable and fit you well. Are closed at the toe. Do not wear sandals. If you use a stepladder: Make sure that it is fully opened. Do not climb a closed stepladder. Make sure that both sides of the stepladder are locked into place. Ask someone to hold it for you, if  possible. Clearly mark and make sure that you can see: Any grab bars or handrails. First and last steps. Where the edge of each step is. Use tools that help you move around (mobility aids) if they are needed. These include: Canes. Walkers. Scooters. Crutches. Turn on the lights when you go into a dark area. Replace any light bulbs as soon as they burn out. Set up your furniture so you have a clear path. Avoid moving your furniture around. If any of your floors are uneven, fix them. If there are any pets around you, be aware of where they are. Review your medicines with your doctor. Some medicines can make you feel dizzy. This can increase your chance of falling. Ask your doctor what other things that you can do to help prevent falls. This information is not intended to replace advice given to you by your health care provider. Make sure you discuss any questions you have with your health care provider. Document Released: 02/24/2009 Document Revised: 10/06/2015 Document Reviewed: 06/04/2014 Elsevier Interactive Patient Education  2017 [employer].

## 2022-09-19 NOTE — Progress Notes (Signed)
Subjective:  Pt completed ADLs, Fall risk, and SDOH during e-check-in on 09/12/22.  Answers verified with pt.    [name] is a 82 y.o. female who presents for Medicare Annual (Subsequent) preventive examination.  I connected with  [name] on 09/19/22 by a audio enabled telemedicine application and verified that I am speaking with the correct person using two identifiers.  Patient Location: Home  Provider Location: Office/Clinic  I discussed the limitations of evaluation and management by telemedicine. The patient expressed understanding and agreed to proceed.   Review of Systems     Cardiac Risk Factors include: advanced age (>54men, >68 women);dyslipidemia;hypertension     Objective:    There were no vitals filed for this visit. There is no height or weight on file to calculate BMI.     09/19/2022    3:41 PM 09/14/2021   10:27 AM 09/01/2020    8:15 AM 08/27/2019    2:24 PM 09/24/2016   10:20 AM 12/14/2014    9:36 AM 02/10/2014    9:31 AM  Advanced Directives  Does Patient Have a Medical Advance Directive? Yes Yes Yes No Yes Yes No  Type of Estate agent of [place];Living will Healthcare Power of [place];Out of facility DNR (pink MOST or yellow form);Living will Healthcare Power of [place];Living will  Healthcare Power of [place];Living will Healthcare Power of Attorney   Does patient want to make changes to medical advance directive?     No - Patient declined No - Patient declined   Copy of Healthcare Power of Attorney in Chart? No - copy requested No - copy requested No - copy requested  No - copy requested No - copy requested   Would patient like information on creating a medical advance directive?    No - Patient declined   No - patient declined information    Current Medications (verified) Outpatient Encounter Medications as of 09/19/2022  Medication Sig   amLODipine (NORVASC) 10 MG tablet Take 1 tablet (10 mg total) by mouth daily.    Ascorbic Acid (VITAMIN C) 1000 MG tablet Take 1,000 mg by mouth daily.   aspirin (ASPIRIN 81) 81 MG EC tablet Take 1 tablet (81 mg total) by mouth daily. Swallow whole.   Calcium Carb-Cholecalciferol (CALCIUM 600+D) 600-10 MG-MCG TABS Take 600 mg by mouth daily.   carboxymethylcellulose (REFRESH PLUS) 0.5 % SOLN Place 1 drop into both eyes 2 (two) times daily.   Cholecalciferol (VITAMIN D3) 50 MCG (2000 UT) capsule Take 1 capsule (2,000 Units total) by mouth daily.   Coenzyme Q10 200 MG capsule Take 200 mg by mouth daily.   denosumab (PROLIA) 60 MG/ML SOSY injection Inject 60 mg into the skin every 6 (six) months.   ezetimibe (ZETIA) 10 MG tablet Take 1 tablet (10 mg total) by mouth daily.   hydrALAZINE (APRESOLINE) 25 MG tablet Take 1 tablet (25 mg total) by mouth 3 (three) times daily.   ibuprofen (ADVIL,MOTRIN) 200 MG tablet Take 400 mg by mouth daily.   Magnesium 400 MG TABS Take 400 mg by mouth daily.   metoprolol succinate (TOPROL-XL) 50 MG 24 hr tablet Take 1.5 tablets (75 mg total) by mouth daily.Take with or immediately following a meal.   Multiple Vitamin (MULTIVITAMIN) tablet Take 1 tablet by mouth daily.   niacin 500 MG tablet Take 500 mg by mouth at bedtime.   Omega-3 Fatty Acids (FISH OIL) 1000 MG CAPS Take 1 capsule by mouth every morning.   omeprazole (  PRILOSEC) 40 MG capsule Take 1 capsule (40 mg total) by mouth daily.   No facility-administered encounter medications on file as of 09/19/2022.    Allergies (verified) Atorvastatin, Diclofenac-misoprostol, Fenofibrate, Rosuvastatin, Statins, Amoxicillin-pot clavulanate, Penicillins, Sulfonamide derivatives, and Tuberculin ppd   History: Past Medical History:  Diagnosis Date   Allergy    Arthritis    osteoarthritis   Back pain    Barrett's esophagus    Cataract    bilateral cateracts removed   Chronic kidney disease    kidney stones   Chronic obstructive bronchitis    Dr Delford Field   Colitis    Colon polyps    GERD  (gastroesophageal reflux disease)    History of chicken pox    History of nephrolithiasis    History of transfusion of packed red blood cells    Hypercholesteremia    Hyperlipidemia    Hypertension    IFG (impaired fasting glucose)    Osteoarthritis    Osteoporosis    pt cannot tolerate fosamax   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   Swelling of both lower extremities    Urinary incontinence    Past Surgical History:  Procedure Laterality Date   ABDOMINAL HYSTERECTOMY  1970   APPENDECTOMY     CHOLECYSTECTOMY     EYE SURGERY  2010   FRACTURE SURGERY  2007   JOINT REPLACEMENT  2007   LEG SURGERY  2008   right   TOTAL KNEE ARTHROPLASTY  04/2006   total left knee replacement   Family History  Problem Relation Age of Onset   Coronary artery disease Mother    Arthritis Mother    High blood pressure Mother    Heart disease Mother    High blood pressure Father    Heart disease Father    Asthma Maternal Grandmother    Tuberculosis Other    Colon cancer Neg Hx    Esophageal cancer Neg Hx    Rectal cancer Neg Hx    Stomach cancer Neg Hx    Social History   Socioeconomic History   Marital status: Widowed    Spouse name: Not on file   Number of children: Not on file   Years of education: Not on file   Highest education level: 10th grade  Occupational History   Occupation: Retired  Tobacco Use   Smoking status: Former    Years: 15    Types: Cigarettes    Quit date: 05/15/1967    Years since quitting: 55.3   Smokeless tobacco: Never  Substance and Sexual Activity   Alcohol use: No   Drug use: No   Sexual activity: Never  Other Topics Concern   Not on file  Social History Narrative   Regular exercise: yes   Social Determinants of Health   Financial Resource Strain: Low Risk  (09/12/2022)   Overall Financial Resource Strain (CARDIA)    Difficulty of Paying Living Expenses: Not hard at all  Food Insecurity: No Food  Insecurity (09/12/2022)   Hunger Vital Sign    Worried About Running Out of Food in the Last Year: Never true    Ran Out of Food in the Last Year: Never true  Transportation Needs: No Transportation Needs (09/12/2022)   PRAPARE - Administrator, Civil Service (Medical): No    Lack of Transportation (Non-Medical): No  Physical Activity: Sufficiently Active (09/12/2022)   Exercise Vital Sign    Days  of Exercise per Week: 3 days    Minutes of Exercise per Session: 60 min  Recent Concern: Physical Activity - Insufficiently Active (08/27/2022)   Exercise Vital Sign    Days of Exercise per Week: 3 days    Minutes of Exercise per Session: 40 min  Stress: No Stress Concern Present (09/12/2022)   [employer] of Occupational Health - Occupational Stress Questionnaire    Feeling of Stress : Not at all  Social Connections: Moderately Integrated (09/12/2022)   Social Connection and Isolation Panel [NHANES]    Frequency of Communication with Friends and Family: More than three times a week    Frequency of Social Gatherings with Friends and Family: More than three times a week    Attends Religious Services: More than 4 times per year    Active Member of [employer] or Organizations: Yes    Attends Banker Meetings: More than 4 times per year    Marital Status: Widowed    Tobacco Counseling Counseling given: Not Answered   Clinical Intake:  Pre-visit preparation completed: Yes  Pain : No/denies pain  Nutritional Risks: None Diabetes: No  How often do you need to have someone help you when you read instructions, pamphlets, or other written materials from your doctor or pharmacy?: 1 - Never   Activities of Daily Living    09/12/2022    2:02 PM  In your present state of health, do you have any difficulty performing the following activities:  Hearing? 1  Comment wears hearing aids  Vision? 0  Difficulty concentrating or making decisions? 0  Walking or climbing stairs? 0   Dressing or bathing? 0  Doing errands, shopping? 0  Preparing Food and eating ? N  Using the Toilet? N  In the past six months, have you accidently leaked urine? Y  Do you have problems with loss of bowel control? N  Managing your Medications? N  Managing your Finances? N  Housekeeping or managing your Housekeeping? N    Patient Care Team: Sandford Craze, NP as PCP - General Hart Carwin, MD (Inactive) as Consulting Physician (Gastroenterology) Sheran Luz, MD as Consulting Physician (Physical Medicine and Rehabilitation) Henrene Pastor, RPH-CPP (Pharmacist)  Indicate any recent Medical Services you may have received from other than [facility] providers in the past year (date may be approximate).     Assessment:   This is a routine wellness examination for Dorri.  Hearing/Vision screen No results found.  Dietary issues and exercise activities discussed: Current Exercise Habits: Home exercise routine, Type of exercise: walking;Other - see comments;strength training/weights (resistence chair), Time (Minutes): 60, Frequency (Times/Week): 3, Weekly Exercise (Minutes/Week): 180, Intensity: Moderate, Exercise limited by: None identified   Goals Addressed   None    Depression Screen    09/19/2022    3:43 PM 08/08/2022    9:54 AM 09/14/2021   10:33 AM 08/29/2021    9:53 AM 02/06/2021    9:25 AM 09/01/2020    8:17 AM 08/27/2019    2:29 PM  PHQ 2/9 Scores  PHQ - 2 Score 0 0 0 3 0 0 0  PHQ- 9 Score  0 0 12       Fall Risk    09/12/2022    2:02 PM 08/08/2022    9:55 AM 09/14/2021   10:31 AM 02/06/2021    9:26 AM 09/01/2020    8:17 AM  Fall Risk   Falls in the past year? 0 0 1 0 0  Number falls  in past yr: 0 0 0 0 0  Injury with Fall? 0 0 0 0 0  Risk for fall due to : No Fall Risks No Fall Risks History of fall(s)    Follow up Falls evaluation completed Falls evaluation completed Falls evaluation completed  Falls prevention discussed    FALL RISK PREVENTION PERTAINING TO THE  HOME:  Any stairs in or around the home? No  Home free of loose throw rugs in walkways, pet beds, electrical cords, etc? Yes  Adequate lighting in your home to reduce risk of falls? Yes   ASSISTIVE DEVICES UTILIZED TO PREVENT FALLS:  Life alert? Yes  Use of a cane, walker or w/c? No  Grab bars in the bathroom? Yes  Shower chair or bench in shower? Yes  Elevated toilet seat or a handicapped toilet? Yes   TIMED UP AND GO:  Was the test performed?  No, audio visit .    Cognitive Function:    09/24/2016   10:22 AM  MMSE - Mini Mental State Exam  Orientation to time 5  Orientation to Place 5  Registration 3  Attention/ Calculation 5  Recall 2  Language- name 2 objects 2  Language- repeat 1  Language- follow 3 step command 3  Language- read & follow direction 1  Write a sentence 1  Copy design 1  Total score 29        09/19/2022    3:52 PM 09/14/2021   10:39 AM 09/01/2020    8:24 AM  6CIT Screen  What Year? 0 points 0 points 0 points  What month? 0 points 0 points 0 points  What time? 0 points 0 points 0 points  Count back from 20 0 points 0 points 0 points  Months in reverse 0 points 2 points 0 points  Repeat phrase 0 points 0 points 0 points  Total Score 0 points 2 points 0 points    Immunizations Immunization History  Administered Date(s) Administered   COVID-19, mRNA, vaccine(Comirnaty)12 years and older 03/07/2022   Fluad Quad(high Dose 65+) 02/25/2019, 02/01/2020, 02/06/2021, 03/07/2022   Influenza Split 04/18/2011, 02/08/2012   Influenza Whole 04/01/2007, 02/02/2008, 01/31/2009, 02/20/2010   Influenza, High Dose Seasonal PF 04/01/2018   Influenza,inj,Quad PF,6+ Mos 02/11/2014, 01/28/2015, 03/27/2016   Influenza-Unspecified 03/14/2013, 03/05/2017   PFIZER(Purple Top)SARS-COV-2 Vaccination 06/03/2019, 06/24/2019, 03/03/2020, 09/29/2020   Pfizer Covid-19 Vaccine Bivalent Booster 26yrs & up 02/13/2021   Pneumococcal Conjugate-13 03/23/2015   Pneumococcal  Polysaccharide-23 07/24/2006, 02/25/2019   Respiratory Syncytial Virus Vaccine,Recomb Aduvanted(Arexvy) 04/09/2022   Td 03/19/2002, 05/14/2006   Tdap 07/12/2020   Zoster Recombinat (Shingrix) 04/02/2017, 06/02/2017    TDAP status: Up to date  Flu Vaccine status: Up to date  Pneumococcal vaccine status: Up to date  Covid-19 vaccine status: Information provided on how to obtain vaccines.   Qualifies for Shingles Vaccine? Yes   Zostavax completed No   Shingrix Completed?: Yes  Screening Tests Health Maintenance  Topic Date Due   Medicare Annual Wellness (AWV)  09/15/2022   INFLUENZA VACCINE  12/13/2022   HEMOGLOBIN A1C  02/08/2023   Diabetic kidney evaluation - eGFR measurement  04/10/2023   Diabetic kidney evaluation - Urine ACR  04/10/2023   DTaP/Tdap/Td (4 - Td or Tdap) 07/13/2030   Pneumonia Vaccine 29+ Years old  Completed   DEXA SCAN  Completed   COVID-19 Vaccine  Completed   Zoster Vaccines- Shingrix  Completed   HPV VACCINES  Aged Out   FOOT EXAM  Discontinued  OPHTHALMOLOGY EXAM  Discontinued    Health Maintenance  Health Maintenance Due  Topic Date Due   Medicare Annual Wellness (AWV)  09/15/2022    Colorectal cancer screening: No longer required.   Mammogram status: Completed 09/11/21. Repeat every year  Bone Density status: Completed 09/06/22. Results reflect: Bone density results: OSTEOPOROSIS. Repeat every 2 years.  Lung Cancer Screening: (Low Dose CT Chest recommended if Age 29-80 years, 30 pack-year currently smoking OR have quit w/in 15years.) does not qualify.    Additional Screening:  Hepatitis C Screening: does not qualify  Vision Screening: Recommended annual ophthalmology exams for early detection of glaucoma and other disorders of the eye. Is the patient up to date with their annual eye exam?  Yes  Who is the provider or what is the name of the office in which the patient attends annual eye exams? Dr. Lucretia Roers If pt is not established with a  provider, would they like to be referred to a provider to establish care? No .   Dental Screening: Recommended annual dental exams for proper oral hygiene  Community Resource Referral / Chronic Care Management: CRR required this visit?  No   CCM required this visit?  No      Plan:     I have personally reviewed and noted the following in the patient's chart:   Medical and social history Use of alcohol, tobacco or illicit drugs  Current medications and supplements including opioid prescriptions. Patient is not currently taking opioid prescriptions. Functional ability and status Nutritional status Physical activity Advanced directives List of other physicians Hospitalizations, surgeries, and ER visits in previous 12 months Vitals Screenings to include cognitive, depression, and falls Referrals and appointments  In addition, I have reviewed and discussed with patient certain preventive protocols, quality metrics, and best practice recommendations. A written personalized care plan for preventive services as well as general preventive health recommendations were provided to patient.   Due to this being a telephonic visit, the after visit summary with patients personalized plan was offered to patient via mail or my-chart. Patient would like to access on my-chart.  Donne Anon, [place]   09/19/2022   Nurse Notes: None

## 2022-10-10 ENCOUNTER — Other Ambulatory Visit (HOSPITAL_BASED_OUTPATIENT_CLINIC_OR_DEPARTMENT_OTHER): Payer: Self-pay

## 2022-10-10 ENCOUNTER — Other Ambulatory Visit: Payer: Self-pay | Admitting: Family

## 2022-10-10 MED ORDER — AMLODIPINE BESYLATE 10 MG PO TABS
10.0000 mg | ORAL_TABLET | Freq: Every day | ORAL | 1 refills | Status: DC
  Filled 2022-10-10 – 2022-10-12 (×2): qty 90, 90d supply, fill #0
  Filled 2023-01-10: qty 90, 90d supply, fill #1

## 2022-10-12 ENCOUNTER — Other Ambulatory Visit (HOSPITAL_BASED_OUTPATIENT_CLINIC_OR_DEPARTMENT_OTHER): Payer: Self-pay

## 2022-10-15 ENCOUNTER — Ambulatory Visit (INDEPENDENT_AMBULATORY_CARE_PROVIDER_SITE_OTHER): Payer: PPO | Admitting: Family Medicine

## 2022-10-15 ENCOUNTER — Encounter (INDEPENDENT_AMBULATORY_CARE_PROVIDER_SITE_OTHER): Payer: Self-pay | Admitting: Family Medicine

## 2022-10-15 VITALS — BP 133/63 | HR 61 | Temp 98.2°F | Ht 60.0 in | Wt 126.0 lb

## 2022-10-15 DIAGNOSIS — E669 Obesity, unspecified: Secondary | ICD-10-CM | POA: Diagnosis not present

## 2022-10-15 DIAGNOSIS — Z6824 Body mass index (BMI) 24.0-24.9, adult: Secondary | ICD-10-CM

## 2022-10-15 DIAGNOSIS — R7303 Prediabetes: Secondary | ICD-10-CM

## 2022-10-15 DIAGNOSIS — E559 Vitamin D deficiency, unspecified: Secondary | ICD-10-CM | POA: Diagnosis not present

## 2022-10-15 NOTE — Progress Notes (Signed)
[name], D.O.  ABFM, ABOM Specializing in Clinical Bariatric Medicine  Office located at: [address] [place], [place]  [address]     Assessment and Plan:   Vitamin D deficiency Assessment: Condition is at goal. Lab Results  Component Value Date   VD25OH 55.56 08/08/2022   VD25OH 90.14 04/09/2022   VD25OH 59.5 08/29/2021  - Compliance of OTC Vitamin D 2,000 UT good since labs were reviewed. Denies any adverse side effects.   Plan: - Continue with OTC med.  - Continue to monitor levels regularly (every 3-4 mo on average) to keep levels within normal limits and prevent over supplementation.   Prediabetes Assessment: Condition is improving, but not optimized.  Lab Results  Component Value Date   HGBA1C 5.7 08/08/2022   HGBA1C 5.8 04/09/2022   HGBA1C 5.8 (H) 08/29/2021   INSULIN 10.0 08/29/2021  - This condition is diet controlled. Patient is not on any prediabetic medication.   Plan: - [name] will continue to work on weight loss, exercise, via their meal plan we devised to help decrease the risk of progressing to diabetes.    TREATMENT PLAN FOR OBESITY: Obesity, current BMI 24.61 Assessment:  [name] is here to discuss her progress with her obesity treatment plan along with follow-up of her obesity related diagnoses. See Medical Weight Management Flowsheet for complete bioelectrical impedance results.  Condition is improving. Biometric data collected today, was reviewed with patient.   Since last office visit on 09/03/22 patient's  Muscle mass has decreased by 0.4 lb. Fat mass has decreased by 1 lb. Total body water has not changed.  Counseling done on how various foods will affect these numbers and how to maximize success  Total lbs lost to date: 44 Total weight loss percentage to date: 25.88   Plan:  - Continue with Category 2 meal plan.   - Encouraged patient to do different brain exercises, such as Spot the Difference Games or Word  Searches.  - Again counseled patient on ways that she can increase her whole grain and healthy fat consumption.  Behavioral Intervention Additional resources provided today: 6 Healthy Super Foods Handout Evidence-based interventions for health behavior change were utilized today including the discussion of self monitoring techniques, problem-solving barriers and SMART goal setting techniques.   Regarding patient's less desirable eating habits and patterns, we employed the technique of small changes.  Pt will specifically work on: trying to do different brain exercises (Word searchs and Spot the Difference Games) and continue increasing consumption of whole grains and healthy fats for next visit.    Recommended Physical Activity Goals  [name] has been advised to slowly work up to 150 minutes of moderate intensity aerobic activity a week and strengthening exercises 2-3 times per week for cardiovascular health, weight loss maintenance and preservation of muscle mass.   She has agreed to Continue current level of physical activity    FOLLOW UP: Return in 6-8 wks. She was informed of the importance of frequent follow up visits to maximize her success with intensive lifestyle modifications for her multiple health conditions.   Subjective:   Chief complaint: Obesity [name] is here to discuss her progress with her obesity treatment plan. She is on  the Category 2 Plan and states she is following her eating plan approximately 95% of the time. She states she is chair exercises and walking 30-60 minutes 5 days per week.  Interval History:  [name] is here for a follow up  office visit.     Since last office visit:   - Endorses that she is not been having any hunger and cravings. - She has been eating water melons and cantaloupes from the [employer]. - She has been working on increasing her consumption of healthy grains and healthy fats (nuts and fish).  Review of Systems:   Pertinent positives were addressed with patient today.  Weight Summary and Biometrics   Weight Lost Since Last Visit: 2lb  Weight Gained Since Last Visit: 0    Vitals Temp: 98.2 F (36.8 C) BP: 133/63 Pulse Rate: 61 SpO2: 98 %   Anthropometric Measurements Height: 5' (1.524 m) Weight: 126 lb (57.2 kg) BMI (Calculated): 24.61 Weight at Last Visit: 128lb Weight Lost Since Last Visit: 2lb Weight Gained Since Last Visit: 0 Starting Weight: 170lb Total Weight Loss (lbs): 44 lb (20 kg) Peak Weight: 190lb   Body Composition  Body Fat %: 39.7 % Fat Mass (lbs): 50.2 lbs Muscle Mass (lbs): 72.4 lbs Total Body Water (lbs): 57.8 lbs Visceral Fat Rating : 11   Other Clinical Data Fasting: no Labs: no Today's Visit #: 14 Starting Date: 08/29/21   Objective:   PHYSICAL EXAM: Blood pressure 133/63, pulse 61, temperature 98.2 F (36.8 C), height 5' (1.524 m), weight 126 lb (57.2 kg), last menstrual period 05/14/1968, SpO2 98 %. Body mass index is 24.61 kg/m.  General: Well Developed, well nourished, and in no acute distress.  HEENT: Normocephalic, atraumatic Skin: Warm and dry, cap RF less 2 sec, good turgor Chest:  Normal excursion, shape, no gross abn Respiratory: speaking in full sentences, no conversational dyspnea NeuroM-Sk: Ambulates w/o assistance, moves * 4 Psych: A and O *3, insight good, mood-full  DIAGNOSTIC DATA REVIEWED:  BMET    Component Value Date/Time   NA 130 (L) 04/09/2022 1022   NA 130 (L) 08/29/2021 1127   K 4.3 04/09/2022 1022   CL 97 04/09/2022 1022   CO2 24 04/09/2022 1022   GLUCOSE 92 04/09/2022 1022   BUN 10 04/09/2022 1022   BUN 14 08/29/2021 1127   CREATININE 0.66 04/09/2022 1022   CREATININE 0.72 02/01/2020 0834   CALCIUM 8.9 04/09/2022 1022   GFRNONAA 82 (L) 02/17/2014 0545   GFRNONAA 84 11/17/2013 0915   GFRAA >90 02/17/2014 0545   GFRAA >89 11/17/2013 0915   Lab Results  Component Value Date   HGBA1C 5.7 08/08/2022    HGBA1C 6.8 (H) 06/23/2009   Lab Results  Component Value Date   INSULIN 10.0 08/29/2021   Lab Results  Component Value Date   TSH 3.530 08/29/2021   CBC    Component Value Date/Time   WBC 12.3 (H) 09/05/2021 0927   RBC 4.35 09/05/2021 0927   HGB 13.0 09/05/2021 0927   HGB 13.5 08/29/2021 1127   HCT 38.9 09/05/2021 0927   HCT 39.8 08/29/2021 1127   PLT 384.0 09/05/2021 0927   PLT 530 (H) 08/29/2021 1127   MCV 89.6 09/05/2021 0927   MCV 88 08/29/2021 1127   MCH 29.7 08/29/2021 1127   MCH 29.3 02/17/2014 0545   MCHC 33.4 09/05/2021 0927   RDW 13.8 09/05/2021 0927   RDW 13.2 08/29/2021 1127   Iron Studies No results found for: "IRON", "TIBC", "FERRITIN", "IRONPCTSAT" Lipid Panel     Component Value Date/Time   CHOL 240 (H) 08/08/2022 1012   CHOL 273 (H) 08/29/2021 1127   TRIG 111.0 08/08/2022 1012   HDL 68.20 08/08/2022 1012   HDL 75 08/29/2021 1127  CHOLHDL 4 08/08/2022 1012   VLDL 22.2 08/08/2022 1012   LDLCALC 150 (H) 08/08/2022 1012   LDLCALC 168 (H) 08/29/2021 1127   LDLCALC 170 (H) 02/01/2020 0834   LDLDIRECT 189.0 02/06/2021 0909   Hepatic Function Panel     Component Value Date/Time   PROT 6.6 04/09/2022 1022   PROT 7.3 08/29/2021 1127   ALBUMIN 4.3 04/09/2022 1022   ALBUMIN 5.0 (H) 08/29/2021 1127   AST 16 04/09/2022 1022   ALT 13 04/09/2022 1022   ALKPHOS 49 04/09/2022 1022   BILITOT 0.5 04/09/2022 1022   BILITOT 0.3 08/29/2021 1127   BILIDIR 0.1 11/17/2013 0915   IBILI 0.3 11/17/2013 0915      Component Value Date/Time   TSH 3.530 08/29/2021 1127   Nutritional Lab Results  Component Value Date   VD25OH 55.56 08/08/2022   VD25OH 90.14 04/09/2022   VD25OH 59.5 08/29/2021    Attestations:   Reviewed by clinician on day of visit: allergies, medications, problem list, medical history, surgical history, family history, social history, and previous encounter notes.  Patient was in the office today and time spent on visit including  pre-visit chart review and post-visit care/coordination of care and electronic medical record documentation was 30 minutes. 50% of that time was in face to face counseling of this patient's medical condition(s) and providing education on treatment options to always include the first-line treatment of diet and lifestyle modification.    I,Special Puri,acting as a Neurosurgeon for [employer], DO.,have documented all relevant documentation on the behalf of Thomasene Lot, DO,as directed by  Thomasene Lot, DO while in the presence of Thomasene Lot, DO.   I, Thomasene Lot, DO, have reviewed all documentation for this visit. The documentation on 10/15/22 for the exam, diagnosis, procedures, and orders are all accurate and complete.

## 2022-11-05 ENCOUNTER — Ambulatory Visit (INDEPENDENT_AMBULATORY_CARE_PROVIDER_SITE_OTHER): Payer: PPO | Admitting: Family Medicine

## 2022-11-05 ENCOUNTER — Ambulatory Visit (HOSPITAL_BASED_OUTPATIENT_CLINIC_OR_DEPARTMENT_OTHER)
Admission: RE | Admit: 2022-11-05 | Discharge: 2022-11-05 | Disposition: A | Discharge status: Home / Self Care | Payer: PPO | Source: Ambulatory Visit | Attending: Family Medicine | Admitting: Family Medicine

## 2022-11-05 ENCOUNTER — Encounter: Payer: Self-pay | Admitting: Family Medicine

## 2022-11-05 VITALS — BP 123/56 | HR 68 | Ht 60.0 in | Wt 131.0 lb

## 2022-11-05 DIAGNOSIS — M7989 Other specified soft tissue disorders: Secondary | ICD-10-CM | POA: Diagnosis not present

## 2022-11-05 DIAGNOSIS — M25451 Effusion, right hip: Secondary | ICD-10-CM

## 2022-11-05 DIAGNOSIS — M47816 Spondylosis without myelopathy or radiculopathy, lumbar region: Secondary | ICD-10-CM | POA: Diagnosis not present

## 2022-11-05 NOTE — Progress Notes (Signed)
   Acute Office Visit  Subjective:     Patient ID: [name], female    DOB: Feb 27, 1941, 82 y.o.   MRN: [id number]  Chief Complaint  Patient presents with   Cyst    HPI Patient is in today for right hip swelling following a fall.    Discussed the use of AI scribe software for clinical note transcription with the patient, who gave verbal consent to proceed.  History of Present Illness   The patient, with a history of a femur fracture in 2008 that required rod placement and subsequent revision due to screw displacement, presents two weeks after a fall. She was knocked off balance by a child and fell backwards onto a driveway. The fall resulted in bruising and increased swelling in the area of the previous surgical site. She denies any other symptoms or complications from the fall, such as pain, other than soreness. She reports that the discoloration from the bruising has improved significantly over the past week.           ROS All review of systems negative except what is listed in the HPI      Objective:    BP (!) 123/56   Pulse 68   Ht 5' (1.524 m)   Wt 131 lb (59.4 kg)   LMP 05/14/1968   SpO2 98%   BMI 25.58 kg/m    Physical Exam Vitals reviewed.  Constitutional:      Appearance: Normal appearance.  Musculoskeletal:        General: Swelling present. No tenderness. Normal range of motion.  Skin:    Findings: Bruising present.  Neurological:     General: No focal deficit present.     Mental Status: She is alert and oriented to person, place, and time. Mental status is at baseline.  Psychiatric:        Mood and Affect: Mood normal.        Behavior: Behavior normal.        Thought Content: Thought content normal.        Judgment: Judgment normal.           No results found for any visits on 11/05/22.      Assessment & Plan:   Problem List Items Addressed This Visit   None Visit Diagnoses     Swelling of right hip joint    -   Primary No acute findings on exam other than mild bruising and mildly increased swelling compared to baseline. No pain. Swelling is soft. Normal ROM. Xray today.  Patient aware of signs/symptoms requiring further/urgent evaluation.    Relevant Orders   DG HIP UNILAT W OR W/O PELVIS 2-3 VIEWS RIGHT       No orders of the defined types were placed in this encounter.   Return if symptoms worsen or fail to improve.  Clayborne Dana, NP

## 2022-11-07 ENCOUNTER — Other Ambulatory Visit (HOSPITAL_BASED_OUTPATIENT_CLINIC_OR_DEPARTMENT_OTHER): Payer: Self-pay

## 2022-11-22 ENCOUNTER — Other Ambulatory Visit (HOSPITAL_BASED_OUTPATIENT_CLINIC_OR_DEPARTMENT_OTHER): Payer: Self-pay

## 2022-11-22 ENCOUNTER — Other Ambulatory Visit: Payer: Self-pay | Admitting: Family

## 2022-11-22 MED ORDER — HYDRALAZINE HCL 25 MG PO TABS
25.0000 mg | ORAL_TABLET | Freq: Three times a day (TID) | ORAL | 0 refills | Status: DC
  Filled 2022-11-22 (×2): qty 270, 90d supply, fill #0

## 2022-12-10 ENCOUNTER — Ambulatory Visit (INDEPENDENT_AMBULATORY_CARE_PROVIDER_SITE_OTHER): Payer: PPO | Admitting: Family Medicine

## 2022-12-11 ENCOUNTER — Ambulatory Visit (INDEPENDENT_AMBULATORY_CARE_PROVIDER_SITE_OTHER): Payer: PPO | Admitting: Family Medicine

## 2022-12-11 ENCOUNTER — Encounter (INDEPENDENT_AMBULATORY_CARE_PROVIDER_SITE_OTHER): Payer: Self-pay | Admitting: Family Medicine

## 2022-12-11 VITALS — BP 136/61 | HR 62 | Temp 97.8°F | Ht 60.0 in | Wt 126.0 lb

## 2022-12-11 DIAGNOSIS — M810 Age-related osteoporosis without current pathological fracture: Secondary | ICD-10-CM | POA: Diagnosis not present

## 2022-12-11 DIAGNOSIS — E559 Vitamin D deficiency, unspecified: Secondary | ICD-10-CM

## 2022-12-11 DIAGNOSIS — I10 Essential (primary) hypertension: Secondary | ICD-10-CM

## 2022-12-11 DIAGNOSIS — E669 Obesity, unspecified: Secondary | ICD-10-CM | POA: Diagnosis not present

## 2022-12-11 DIAGNOSIS — Z6824 Body mass index (BMI) 24.0-24.9, adult: Secondary | ICD-10-CM | POA: Diagnosis not present

## 2022-12-11 NOTE — Progress Notes (Signed)
[name], D.O.  ABFM, ABOM Specializing in Clinical Bariatric Medicine  Office located at: [address] [place], [place]  [address]     Assessment and Plan:   Osteoporosis, unspecified osteoporosis type, unspecified pathological fracture presence Assessment & Plan: On 08/08/20, pt had a bone density scan that showed a T-Score of -3.8. In comparison to bone density exam on 09/06/22, her T-Score has worsened to -4.6. Pt is prescribed Prolia 60 mg every 6 months. Reports missing her Prolia shot that was due on June because of cost of medication ($300).   I encouraged [name] to schedule her Prolia shot and continue resistance training. Also, I recommended pt to increase her walking to 20 minutes every other day. Will continue to monitor condition alongside PCP/specialists.    Vitamin D deficiency Assessment & Plan: This condition is being treated with OTC Vitamin D3 2,000 units daily and Calcium Carb-Cholecalciferol 600 mg daily.   Lab Results  Component Value Date   VD25OH 55.56 08/08/2022   VD25OH 90.14 04/09/2022   VD25OH 59.5 08/29/2021   To further improve this condition, pt advised to continue with their weight loss efforts and both supplements at current dose. Will recheck levels as deemed appropriate.    Essential hypertension Assessment & Plan: Blood pressure is stable today. Condition is being treated with Toprol XL 75 mg daily, Norvasc 10 mg daily, and Apresoline 25 mg tid.   Last 3 blood pressure readings in our office are as follows: BP Readings from Last 3 Encounters:  12/11/22 136/61  11/05/22 (!) 123/56  10/15/22 133/63   Continue with all antihypertensives per PCP. Continue with Prudent nutritional plan and low sodium diet, advance exercise as tolerated. We will continue to monitor closely alongside PCP/ specialists.   TREATMENT PLAN FOR OBESITY: Obesity, current BMI 24.61 Obesity (BMI 30.0-34.9) - starting BMI 33.2 Assessment & Plan:   [name] is here to discuss her progress with her obesity treatment plan along with follow-up of her obesity related diagnoses. See Medical Weight Management Flowsheet for complete bioelectrical impedance results.  Condition is stable. Biometric data collected today, was reviewed with patient.   Since last office visit on 10/15/22 patient's  Muscle mass has decreased by 0.4 lb. Fat mass has increased by 0.4 lb. Total body water has increased by 1.8 lb.  Counseling done on how various foods will affect these numbers and how to maximize success  Total lbs lost to date: 44 lbs  Total weight loss percentage to date: 25.88%   Continue the Category 2 meal plan.   Behavioral Intervention Additional resources provided today: patient declined Evidence-based interventions for health behavior change were utilized today including the discussion of self monitoring techniques, problem-solving barriers and SMART goal setting techniques.   Regarding patient's less desirable eating habits and patterns, we employed the technique of small changes.  Pt will specifically work on: continuing with prudent nutritional plan for next visit.    Recommended Physical Activity Goals  Alissha has been advised to slowly work up to 150 minutes of moderate intensity aerobic activity a week and strengthening exercises 2-3 times per week for cardiovascular health, weight loss maintenance and preservation of muscle mass. She has agreed to Think about ways to increase daily physical activity and overcoming barriers to exercise  FOLLOW UP: Return in about 12 weeks (around 03/05/2023). She was informed of the importance of frequent follow up visits to maximize her success with intensive lifestyle modifications for her multiple health conditions.  Subjective:   Chief complaint: Obesity Nino is here to discuss her progress with her obesity treatment plan. She is on the Category 2 Plan and states she is following her  eating plan approximately 98% of the time. She states she is doing resistance chair exercises 30 minutes 7 days per week.  Interval History:  [name] is here for a follow up office visit. Since last OV, [name] has been okay. She recently was knocked off balance by a child and fell backwards onto a driveway. Endorses that the swelling of her right hip joint has improved. Along with resistance chair exercises, she walks 15 minutes every other day. She has no complaints with her meal plan- hunger and cravings are well controlled.   Review of Systems:  Pertinent positives were addressed with patient today.  Reviewed by clinician on day of visit: allergies, medications, problem list, medical history, surgical history, family history, social history, and previous encounter notes.  Weight Summary and Biometrics   Weight Lost Since Last Visit: 0lb  Weight Gained Since Last Visit: 0lb  Vitals Temp: 97.8 F (36.6 C) BP: 136/61 Pulse Rate: 62 SpO2: 97 %   Anthropometric Measurements Height: 5' (1.524 m) Weight: 126 lb (57.2 kg) BMI (Calculated): 24.61 Weight at Last Visit: 126lb Weight Lost Since Last Visit: 0lb Weight Gained Since Last Visit: 0lb Starting Weight: 170lb Total Weight Loss (lbs): 44 lb (20 kg) Peak Weight: 190lb   Body Composition  Body Fat %: 40 % Fat Mass (lbs): 50.6 lbs Muscle Mass (lbs): 72 lbs Total Body Water (lbs): 59.6 lbs Visceral Fat Rating : 11   Other Clinical Data Fasting: no Labs: no Today's Visit #: 15 Starting Date: 08/29/21   Objective:   PHYSICAL EXAM: Blood pressure 136/61, pulse 62, temperature 97.8 F (36.6 C), height 5' (1.524 m), weight 126 lb (57.2 kg), last menstrual period 05/14/1968, SpO2 97%. Body mass index is 24.61 kg/m.  General: Well Developed, well nourished, and in no acute distress.  HEENT: Normocephalic, atraumatic Skin: Warm and dry, cap RF less 2 sec, good turgor Chest:  Normal excursion, shape, no gross  abn Respiratory: speaking in full sentences, no conversational dyspnea NeuroM-Sk: Ambulates w/o assistance, moves * 4 Psych: A and O *3, insight good, mood-full  DIAGNOSTIC DATA REVIEWED:  BMET    Component Value Date/Time   NA 130 (L) 04/09/2022 1022   NA 130 (L) 08/29/2021 1127   K 4.3 04/09/2022 1022   CL 97 04/09/2022 1022   CO2 24 04/09/2022 1022   GLUCOSE 92 04/09/2022 1022   BUN 10 04/09/2022 1022   BUN 14 08/29/2021 1127   CREATININE 0.66 04/09/2022 1022   CREATININE 0.72 02/01/2020 0834   CALCIUM 8.9 04/09/2022 1022   GFRNONAA 82 (L) 02/17/2014 0545   GFRNONAA 84 11/17/2013 0915   GFRAA >90 02/17/2014 0545   GFRAA >89 11/17/2013 0915   Lab Results  Component Value Date   HGBA1C 5.7 08/08/2022   HGBA1C 6.8 (H) 06/23/2009   Lab Results  Component Value Date   INSULIN 10.0 08/29/2021   Lab Results  Component Value Date   TSH 3.530 08/29/2021   CBC    Component Value Date/Time   WBC 12.3 (H) 09/05/2021 0927   RBC 4.35 09/05/2021 0927   HGB 13.0 09/05/2021 0927   HGB 13.5 08/29/2021 1127   HCT 38.9 09/05/2021 0927   HCT 39.8 08/29/2021 1127   PLT 384.0 09/05/2021 0927   PLT 530 (H) 08/29/2021 1127  MCV 89.6 09/05/2021 0927   MCV 88 08/29/2021 1127   MCH 29.7 08/29/2021 1127   MCH 29.3 02/17/2014 0545   MCHC 33.4 09/05/2021 0927   RDW 13.8 09/05/2021 0927   RDW 13.2 08/29/2021 1127   Iron Studies No results found for: "IRON", "TIBC", "FERRITIN", "IRONPCTSAT" Lipid Panel     Component Value Date/Time   CHOL 240 (H) 08/08/2022 1012   CHOL 273 (H) 08/29/2021 1127   TRIG 111.0 08/08/2022 1012   HDL 68.20 08/08/2022 1012   HDL 75 08/29/2021 1127   CHOLHDL 4 08/08/2022 1012   VLDL 22.2 08/08/2022 1012   LDLCALC 150 (H) 08/08/2022 1012   LDLCALC 168 (H) 08/29/2021 1127   LDLCALC 170 (H) 02/01/2020 0834   LDLDIRECT 189.0 02/06/2021 0909   Hepatic Function Panel     Component Value Date/Time   PROT 6.6 04/09/2022 1022   PROT 7.3 08/29/2021  1127   ALBUMIN 4.3 04/09/2022 1022   ALBUMIN 5.0 (H) 08/29/2021 1127   AST 16 04/09/2022 1022   ALT 13 04/09/2022 1022   ALKPHOS 49 04/09/2022 1022   BILITOT 0.5 04/09/2022 1022   BILITOT 0.3 08/29/2021 1127   BILIDIR 0.1 11/17/2013 0915   IBILI 0.3 11/17/2013 0915      Component Value Date/Time   TSH 3.530 08/29/2021 1127   Nutritional Lab Results  Component Value Date   VD25OH 55.56 08/08/2022   VD25OH 90.14 04/09/2022   VD25OH 59.5 08/29/2021    Attestations:   I, Special Puri , acting as a Stage manager for [employer], DO., have compiled all relevant documentation for today's office visit on behalf of Thomasene Lot, DO, while in the presence of [employer], DO.  I have reviewed the above documentation for accuracy and completeness, and I agree with the above. [name], D.O.  The 21st Century Cures Act was signed into law in 2016 which includes the topic of electronic health records.  This provides immediate access to information in MyChart.  This includes consultation notes, operative notes, office notes, lab results and pathology reports.  If you have any questions about what you read please let us know at your next visit so we can discuss your concerns and take corrective action if need be.  We are right here with you.

## 2022-12-17 ENCOUNTER — Other Ambulatory Visit (HOSPITAL_BASED_OUTPATIENT_CLINIC_OR_DEPARTMENT_OTHER): Payer: Self-pay

## 2022-12-27 ENCOUNTER — Encounter (INDEPENDENT_AMBULATORY_CARE_PROVIDER_SITE_OTHER): Payer: Self-pay

## 2022-12-31 ENCOUNTER — Other Ambulatory Visit: Payer: Self-pay | Admitting: Family

## 2022-12-31 ENCOUNTER — Other Ambulatory Visit (HOSPITAL_BASED_OUTPATIENT_CLINIC_OR_DEPARTMENT_OTHER): Payer: Self-pay

## 2022-12-31 MED ORDER — METOPROLOL SUCCINATE ER 50 MG PO TB24
75.0000 mg | ORAL_TABLET | Freq: Every day | ORAL | 1 refills | Status: DC
  Filled 2022-12-31: qty 135, 90d supply, fill #0
  Filled 2023-04-04: qty 135, 90d supply, fill #1

## 2023-02-06 ENCOUNTER — Other Ambulatory Visit: Payer: Self-pay | Admitting: Family

## 2023-02-08 ENCOUNTER — Ambulatory Visit (INDEPENDENT_AMBULATORY_CARE_PROVIDER_SITE_OTHER): Payer: PPO | Admitting: Family

## 2023-02-08 ENCOUNTER — Other Ambulatory Visit: Payer: Self-pay | Admitting: Pharmacy Technician

## 2023-02-08 ENCOUNTER — Other Ambulatory Visit: Payer: Self-pay | Admitting: Family

## 2023-02-08 ENCOUNTER — Other Ambulatory Visit: Payer: Self-pay

## 2023-02-08 ENCOUNTER — Other Ambulatory Visit (HOSPITAL_COMMUNITY): Payer: Self-pay

## 2023-02-08 ENCOUNTER — Other Ambulatory Visit (HOSPITAL_BASED_OUTPATIENT_CLINIC_OR_DEPARTMENT_OTHER): Payer: Self-pay

## 2023-02-08 ENCOUNTER — Telehealth: Payer: Self-pay | Admitting: Family

## 2023-02-08 VITALS — BP 144/65 | HR 68 | Temp 98.4°F | Resp 16 | Wt 131.0 lb

## 2023-02-08 DIAGNOSIS — Z23 Encounter for immunization: Secondary | ICD-10-CM | POA: Diagnosis not present

## 2023-02-08 DIAGNOSIS — I10 Essential (primary) hypertension: Secondary | ICD-10-CM | POA: Diagnosis not present

## 2023-02-08 DIAGNOSIS — M810 Age-related osteoporosis without current pathological fracture: Secondary | ICD-10-CM | POA: Diagnosis not present

## 2023-02-08 DIAGNOSIS — Z9181 History of falling: Secondary | ICD-10-CM | POA: Diagnosis not present

## 2023-02-08 DIAGNOSIS — K219 Gastro-esophageal reflux disease without esophagitis: Secondary | ICD-10-CM | POA: Diagnosis not present

## 2023-02-08 DIAGNOSIS — W19XXXA Unspecified fall, initial encounter: Secondary | ICD-10-CM | POA: Insufficient documentation

## 2023-02-08 DIAGNOSIS — R739 Hyperglycemia, unspecified: Secondary | ICD-10-CM

## 2023-02-08 DIAGNOSIS — E7800 Pure hypercholesterolemia, unspecified: Secondary | ICD-10-CM | POA: Diagnosis not present

## 2023-02-08 LAB — COMPREHENSIVE METABOLIC PANEL
ALT: 14 U/L (ref 0–35)
AST: 18 U/L (ref 0–37)
Albumin: 4.4 g/dL (ref 3.5–5.2)
Alkaline Phosphatase: 58 U/L (ref 39–117)
BUN: 13 mg/dL (ref 6–23)
CO2: 24 meq/L (ref 19–32)
Calcium: 9.6 mg/dL (ref 8.4–10.5)
Chloride: 103 meq/L (ref 96–112)
Creatinine, Ser: 0.64 mg/dL (ref 0.40–1.20)
GFR: 82.65 mL/min (ref >60.00)
Glucose, Bld: 94 mg/dL (ref 70–99)
Potassium: 3.9 meq/L (ref 3.5–5.1)
Sodium: 136 meq/L (ref 135–145)
Total Bilirubin: 0.5 mg/dL (ref 0.2–1.2)
Total Protein: 6.6 g/dL (ref 6.0–8.3)

## 2023-02-08 LAB — HEMOGLOBIN A1C: Hgb A1c MFr Bld: 5.6 % (ref 4.6–6.5)

## 2023-02-08 MED ORDER — DENOSUMAB 60 MG/ML ~~LOC~~ SOSY
60.0000 mg | PREFILLED_SYRINGE | Freq: Once | SUBCUTANEOUS | 0 refills | Status: AC
  Filled 2023-02-08: qty 1, 180d supply, fill #0
  Filled 2023-02-08: qty 1, 1d supply, fill #0

## 2023-02-08 NOTE — Assessment & Plan Note (Signed)
>>  ASSESSMENT AND PLAN FOR ESSENTIAL HYPERTENSION WRITTEN ON 02/08/2023 12:16 PM BY O'SULLIVAN, Kidus Delman, NP   Blood pressure slightly elevated at the visit, possibly due to talking. Current medications include Amlodipine 10mg , Hydralazine 25mg , and Metoprolol 50mg . I am hesitant to increase her bp meds in the setting of her recent falls.

## 2023-02-08 NOTE — Progress Notes (Signed)
Opened in error

## 2023-02-08 NOTE — Assessment & Plan Note (Signed)
>>  ASSESSMENT AND PLAN FOR HYPERGLYCEMIA WRITTEN ON 02/08/2023  8:40 AM BY O'SULLIVAN, Cyann Venti, NP  Wt Readings from Last 3 Encounters:  02/08/23 131 lb (59.4 kg)  12/11/22 126 lb (57.2 kg)  11/05/22 131 lb (59.4 kg)   Lab Results  Component Value Date   HGBA1C 5.7 08/08/2022

## 2023-02-08 NOTE — Assessment & Plan Note (Addendum)
  Blood pressure slightly elevated at the visit, possibly due to talking. Current medications include Amlodipine 10mg , Hydralazine 25mg , and Metoprolol 50mg . I am hesitant to increase her bp meds in the setting of her recent falls.

## 2023-02-08 NOTE — Patient Instructions (Signed)
VISIT SUMMARY:  During your recent visit, we discussed your ongoing management of hypertension, hyperlipidemia, osteoporosis, and gastroesophageal reflux disease (GERD). You also mentioned a couple of recent falls, which we addressed. You are doing well overall, maintaining a healthy diet and exercise regimen.  YOUR PLAN:  -OSTEOPOROSIS: Osteoporosis is a condition that weakens bones, making them fragile and more likely to break. We discussed the cost of your medication, Prolia, and you decided to continue it despite the cost.  -HYPERTENSION: Hypertension, or high blood pressure, is a condition that can lead to serious health problems if not managed. Your blood pressure was slightly elevated during the visit, possibly due to talking. We will recheck your blood pressure.  -HYPERLIPIDEMIA: Hyperlipidemia is a condition where there are high levels of fats (lipids) in the blood. You are currently on Zetia for this, as you are unable to tolerate statins. We will continue with Zetia.  -GASTROESOPHAGEAL REFLUX DISEASE (GERD): GERD is a chronic condition where stomach acid frequently flows back into the tube connecting your mouth and stomach (esophagus). Your symptoms have improved with an increased dose of Omeprazole, which we will continue.  -FALLS: You reported two recent falls, but thankfully, you did not sustain any serious injuries. However, you have noticed a decrease in your stability. We encourage the use of a cane for stability and water aerobics 3 times a week for balance and strength. We may also refer you to physical therapy for balance and strengthening exercises.  INSTRUCTIONS:  Today, we administered a high-dose influenza vaccine. We also checked your kidney and liver function, and A1c. Please continue with your current medications and exercise regimen. We will follow up in 6 months.

## 2023-02-08 NOTE — Assessment & Plan Note (Signed)
Patient reported two recent falls, one due to being knocked off balance and another due to tripping. No significant injuries reported but patient acknowledges decreased stability. -Encouraged use of a cane for stability. -Recommended that she begin water aerobics 3 times a week for balance and strength. -Consider formal referral to physical therapy for balance and strengthening exercises.

## 2023-02-08 NOTE — Telephone Encounter (Signed)
Pt states shew would like to get scheduled for her prolia injection. States she has already paid for it through the pharmacy and ins has approved it. Please confirm.

## 2023-02-08 NOTE — Assessment & Plan Note (Signed)
Wt Readings from Last 3 Encounters:  02/08/23 131 lb (59.4 kg)  12/11/22 126 lb (57.2 kg)  11/05/22 131 lb (59.4 kg)   Lab Results  Component Value Date   HGBA1C 5.7 08/08/2022

## 2023-02-08 NOTE — Progress Notes (Signed)
Subjective:     Patient ID: [name], female    DOB: 06/01/1940, 82 y.o.   MRN: [id number]  Chief Complaint  Patient presents with   Hypertension    Here for follow up   Osteoporosis    Here for follow up    HPI  Discussed the use of AI scribe software for clinical note transcription with the patient, who gave verbal consent to proceed.  History of Present Illness   The patient, with a history of hypertension, hyperlipidemia, and gastroesophageal reflux disease, presents for a routine follow-up. She expresses concern about the high cost of Prolia, but plans to continue the medication regardless. She reports that her blood pressure is well-controlled on amlodipine 10mg , hydralazine 25mg , and metoprolol 50mg . Her hyperlipidemia is managed with Zetia, as she is unable to tolerate statins. Her reflux symptoms have improved since increasing the dose of omeprazole.  The patient has been maintaining a healthy diet and exercise regimen, with her weight remaining stable around 130 pounds. She reports a couple of falls since the last visit, one due to being knocked off balance by a child and another due to tripping while entering a church. She did not sustain any serious injuries from these falls, but she has noticed a decrease in her stability. She uses a resistance chair and resistance bands for exercise, and is considering returning to water aerobics.          Health Maintenance Due  Topic Date Due   COVID-19 Vaccine (7 - 2023-24 season) 01/13/2023   HEMOGLOBIN A1C  02/08/2023    Past Medical History:  Diagnosis Date   Allergy    Arthritis    osteoarthritis   Back pain    Barrett's esophagus    Cataract    bilateral cateracts removed   Chronic kidney disease    kidney stones   Chronic obstructive bronchitis    Dr Delford Field   Colitis    Colon polyps    GERD (gastroesophageal reflux disease)    History of chicken pox    History of nephrolithiasis    History of  transfusion of packed red blood cells    Hypercholesteremia    Hyperlipidemia    Hypertension    IFG (impaired fasting glucose)    Osteoarthritis    Osteoporosis    pt cannot tolerate fosamax   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   Swelling of both lower extremities    Urinary incontinence     Past Surgical History:  Procedure Laterality Date   ABDOMINAL HYSTERECTOMY  1970   APPENDECTOMY     CHOLECYSTECTOMY     EYE SURGERY  2010   FRACTURE SURGERY  2007   JOINT REPLACEMENT  2007   LEG SURGERY  2008   right   TOTAL KNEE ARTHROPLASTY  04/2006   total left knee replacement    Family History  Problem Relation Age of Onset   Coronary artery disease Mother    Arthritis Mother    High blood pressure Mother    Heart disease Mother    High blood pressure Father    Heart disease Father    Asthma Maternal Grandmother    Tuberculosis Other    Colon cancer Neg Hx    Esophageal cancer Neg Hx    Rectal cancer Neg Hx    Stomach cancer Neg Hx     Social History   Socioeconomic History   Marital  status: Widowed    Spouse name: Not on file   Number of children: Not on file   Years of education: Not on file   Highest education level: 10th grade  Occupational History   Occupation: Retired  Tobacco Use   Smoking status: Former    Current packs/day: 0.00    Types: Cigarettes    Start date: 05/14/1952    Quit date: 05/15/1967    Years since quitting: 55.7   Smokeless tobacco: Never  Substance and Sexual Activity   Alcohol use: No   Drug use: No   Sexual activity: Never  Other Topics Concern   Not on file  Social History Narrative   Regular exercise: yes   Social Determinants of Health   Financial Resource Strain: Low Risk  (09/12/2022)   Overall Financial Resource Strain (CARDIA)    Difficulty of Paying Living Expenses: Not hard at all  Food Insecurity: No Food Insecurity (09/12/2022)   Hunger Vital Sign    Worried About  Running Out of Food in the Last Year: Never true    Ran Out of Food in the Last Year: Never true  Transportation Needs: No Transportation Needs (09/12/2022)   PRAPARE - Administrator, Civil Service (Medical): No    Lack of Transportation (Non-Medical): No  Physical Activity: Sufficiently Active (09/12/2022)   Exercise Vital Sign    Days of Exercise per Week: 3 days    Minutes of Exercise per Session: 60 min  Recent Concern: Physical Activity - Insufficiently Active (08/27/2022)   Exercise Vital Sign    Days of Exercise per Week: 3 days    Minutes of Exercise per Session: 40 min  Stress: No Stress Concern Present (09/12/2022)   [employer] of Occupational Health - Occupational Stress Questionnaire    Feeling of Stress : Not at all  Social Connections: Moderately Integrated (09/12/2022)   Social Connection and Isolation Panel [NHANES]    Frequency of Communication with Friends and Family: More than three times a week    Frequency of Social Gatherings with Friends and Family: More than three times a week    Attends Religious Services: More than 4 times per year    Active Member of [employer] or Organizations: Yes    Attends Banker Meetings: More than 4 times per year    Marital Status: Widowed  Intimate Partner Violence: Not At Risk (09/14/2021)   Humiliation, Afraid, Rape, and Kick questionnaire    Fear of Current or Ex-Partner: No    Emotionally Abused: No    Physically Abused: No    Sexually Abused: No    Outpatient Medications Prior to Visit  Medication Sig Dispense Refill   amLODipine (NORVASC) 10 MG tablet Take 1 tablet (10 mg total) by mouth daily. 90 tablet 1   Ascorbic Acid (VITAMIN C) 1000 MG tablet Take 1,000 mg by mouth daily.     aspirin (ASPIRIN 81) 81 MG EC tablet Take 1 tablet (81 mg total) by mouth daily. Swallow whole. 30 tablet 12   Calcium Carb-Cholecalciferol (CALCIUM 600+D) 600-10 MG-MCG TABS Take 600 mg by mouth daily.      carboxymethylcellulose (REFRESH PLUS) 0.5 % SOLN Place 1 drop into both eyes 2 (two) times daily.     Cholecalciferol (VITAMIN D3) 50 MCG (2000 UT) capsule Take 1 capsule (2,000 Units total) by mouth daily.     Coenzyme Q10 200 MG capsule Take 200 mg by mouth daily.     ezetimibe (ZETIA) 10  MG tablet Take 1 tablet (10 mg total) by mouth daily. 90 tablet 1   hydrALAZINE (APRESOLINE) 25 MG tablet Take 1 tablet (25 mg total) by mouth 3 (three) times daily. 270 tablet 0   ibuprofen (ADVIL,MOTRIN) 200 MG tablet Take 400 mg by mouth daily.     Magnesium 400 MG TABS Take 400 mg by mouth daily.     metoprolol succinate (TOPROL-XL) 50 MG 24 hr tablet Take 1.5 tablets (75 mg total) by mouth daily.Take with or immediately following a meal. 135 tablet 1   Multiple Vitamin (MULTIVITAMIN) tablet Take 1 tablet by mouth daily.     niacin 500 MG tablet Take 500 mg by mouth at bedtime.     Omega-3 Fatty Acids (FISH OIL) 1000 MG CAPS Take 1 capsule by mouth every morning.     omeprazole (PRILOSEC) 40 MG capsule Take 1 capsule (40 mg total) by mouth daily. 90 capsule 1   denosumab (PROLIA) 60 MG/ML SOSY injection Inject 60 mg into the skin every 6 (six) months.     No facility-administered medications prior to visit.    Allergies  Allergen Reactions   Atorvastatin Other (See Comments)    REACTION: MUSCLE PAINS   Diclofenac-Misoprostol Diarrhea and Nausea And Vomiting   Fenofibrate Other (See Comments)    REACTION: carpopedal spasms   Rosuvastatin Other (See Comments)    REACTION: MUSCLE PAINS   Statins Other (See Comments)    myalgias   Amoxicillin-Pot Clavulanate Other (See Comments)    REACTION: unspecified   Penicillins Hives   Sulfonamide Derivatives Hives   Tuberculin Ppd Other (See Comments)    Redness at sight    ROS See HPI    Objective:    Physical Exam Constitutional:      General: She is not in acute distress.    Appearance: Normal appearance. She is well-developed.  HENT:      Head: Normocephalic and atraumatic.     Right Ear: External ear normal.     Left Ear: External ear normal.  Eyes:     General: No scleral icterus. Neck:     Thyroid: No thyromegaly.  Cardiovascular:     Rate and Rhythm: Normal rate and regular rhythm.     Heart sounds: Normal heart sounds. No murmur heard. Pulmonary:     Effort: Pulmonary effort is normal. No respiratory distress.     Breath sounds: Normal breath sounds. No wheezing.  Musculoskeletal:     Cervical back: Neck supple.  Skin:    General: Skin is warm and dry.  Neurological:     Mental Status: She is alert and oriented to person, place, and time.  Psychiatric:        Mood and Affect: Mood normal.        Behavior: Behavior normal.        Thought Content: Thought content normal.        Judgment: Judgment normal.      BP (!) 144/65   Pulse 68   Temp 98.4 F (36.9 C) (Oral)   Resp 16   Wt 131 lb (59.4 kg)   LMP 05/14/1968   SpO2 99%   BMI 25.58 kg/m  Wt Readings from Last 3 Encounters:  02/08/23 131 lb (59.4 kg)  12/11/22 126 lb (57.2 kg)  11/05/22 131 lb (59.4 kg)       Assessment & Plan:   Problem List Items Addressed This Visit       Unprioritized   Osteoporosis  Will see if she can get prolia cheaper at the pharmacy.       Relevant Medications   denosumab (PROLIA) 60 MG/ML SOSY injection   Hyperglycemia    Wt Readings from Last 3 Encounters:  02/08/23 131 lb (59.4 kg)  12/11/22 126 lb (57.2 kg)  11/05/22 131 lb (59.4 kg)   Lab Results  Component Value Date   HGBA1C 5.7 08/08/2022         Relevant Orders   Comp Met (CMET)   HgB A1c   HYPERCHOLESTEROLEMIA     Patient unable to tolerate statins, currently on Zetia. -Continue Zetia.       GERD - Primary    Improved on increased dose of omeprazole.       Fall    Patient reported two recent falls, one due to being knocked off balance and another due to tripping. No significant injuries reported but patient acknowledges  decreased stability. -Encouraged use of a cane for stability. -Recommended that she begin water aerobics 3 times a week for balance and strength. -Consider formal referral to physical therapy for balance and strengthening exercises.      Essential hypertension     Blood pressure slightly elevated at the visit, possibly due to talking. Current medications include Amlodipine 10mg , Hydralazine 25mg , and Metoprolol 50mg . I am hesitant to increase her bp meds in the setting of her recent falls.       Other Visit Diagnoses     Needs flu shot       Relevant Orders   Flu Vaccine Trivalent High Dose (Fluad) (Completed)       I have discontinued Anjolie Majer. Swailes's denosumab. I am also having her start on denosumab. Additionally, I am having her maintain her Coenzyme Q10, Fish Oil, multivitamin, vitamin C, carboxymethylcellulose, niacin, ibuprofen, aspirin EC, Magnesium, Calcium Carb-Cholecalciferol, Vitamin D3, omeprazole, ezetimibe, amLODipine, hydrALAZINE, and metoprolol succinate.  Meds ordered this encounter  Medications   denosumab (PROLIA) 60 MG/ML SOSY injection    Sig: Inject 60 mg into the skin once for 1 dose.    Dispense:  1 mL    Refill:  0    Order Specific Question:   Supervising Provider    Answer:   Danise Edge A [[id number]]

## 2023-02-08 NOTE — Assessment & Plan Note (Signed)
Will see if she can get prolia cheaper at the pharmacy.

## 2023-02-08 NOTE — Assessment & Plan Note (Signed)
Improved on increased dose of omeprazole.

## 2023-02-08 NOTE — Assessment & Plan Note (Signed)
>>  ASSESSMENT AND PLAN FOR HYPERCHOLESTEROLEMIA WRITTEN ON 02/08/2023 12:13 PM BY O'SULLIVAN, Laymon Stockert, NP   Patient unable to tolerate statins, currently on Zetia . -Continue Zetia .

## 2023-02-08 NOTE — Progress Notes (Signed)
Specialty Pharmacy Initial Fill Coordination Note  [name] is a 82 y.o. female contacted today regarding refills of specialty medication(s) Denosumab .  Patient requested Courier to Provider Office  on 02/12/23  to verified address [facility] [address]   Medication will be filled on 9/30.   Patient is aware of $200 copayment.

## 2023-02-08 NOTE — Assessment & Plan Note (Signed)
  Patient unable to tolerate statins, currently on Zetia. -Continue Zetia.

## 2023-02-11 ENCOUNTER — Other Ambulatory Visit: Payer: Self-pay

## 2023-02-11 ENCOUNTER — Other Ambulatory Visit (HOSPITAL_COMMUNITY): Payer: Self-pay

## 2023-02-12 ENCOUNTER — Other Ambulatory Visit: Payer: Self-pay | Admitting: Family

## 2023-02-12 ENCOUNTER — Other Ambulatory Visit (HOSPITAL_BASED_OUTPATIENT_CLINIC_OR_DEPARTMENT_OTHER): Payer: Self-pay

## 2023-02-12 ENCOUNTER — Other Ambulatory Visit: Payer: Self-pay

## 2023-02-12 MED ORDER — OMEPRAZOLE 40 MG PO CPDR
40.0000 mg | DELAYED_RELEASE_CAPSULE | Freq: Every day | ORAL | 1 refills | Status: DC
  Filled 2023-02-12: qty 90, 90d supply, fill #0
  Filled 2023-05-10: qty 90, 90d supply, fill #1

## 2023-02-12 NOTE — Telephone Encounter (Signed)
Prolia injection received from outpt pharmacy and placed in clinic refrigerator.

## 2023-02-12 NOTE — Telephone Encounter (Signed)
Patient scheduled for prolia injection tomorrow

## 2023-02-13 ENCOUNTER — Ambulatory Visit: Payer: PPO

## 2023-02-13 ENCOUNTER — Telehealth: Payer: Self-pay

## 2023-02-13 DIAGNOSIS — M810 Age-related osteoporosis without current pathological fracture: Secondary | ICD-10-CM

## 2023-02-13 MED ORDER — DENOSUMAB 60 MG/ML ~~LOC~~ SOSY
60.0000 mg | PREFILLED_SYRINGE | Freq: Once | SUBCUTANEOUS | Status: AC
Start: 2023-02-13 — End: 2023-02-13
  Administered 2023-02-13: 60 mg via SUBCUTANEOUS

## 2023-02-13 NOTE — Progress Notes (Signed)
Pt here for Prolia injection per [name].  Injection given in left subcutaneous. Pt handled well.

## 2023-02-13 NOTE — Telephone Encounter (Signed)
-----   Message from [facility] S sent at 02/13/2023  1:58 PM EDT ----- Pt received Prolia injection today

## 2023-02-13 NOTE — Telephone Encounter (Signed)
Pt due on/after 08/14/23

## 2023-02-20 ENCOUNTER — Other Ambulatory Visit (HOSPITAL_BASED_OUTPATIENT_CLINIC_OR_DEPARTMENT_OTHER): Payer: Self-pay

## 2023-02-20 ENCOUNTER — Other Ambulatory Visit: Payer: Self-pay

## 2023-02-20 ENCOUNTER — Other Ambulatory Visit: Payer: Self-pay | Admitting: Family

## 2023-02-20 MED ORDER — HYDRALAZINE HCL 25 MG PO TABS
25.0000 mg | ORAL_TABLET | Freq: Three times a day (TID) | ORAL | 0 refills | Status: DC
  Filled 2023-02-20: qty 270, 90d supply, fill #0

## 2023-02-25 ENCOUNTER — Ambulatory Visit: Payer: PPO | Admitting: Pharmacist

## 2023-02-25 DIAGNOSIS — R739 Hyperglycemia, unspecified: Secondary | ICD-10-CM

## 2023-02-25 DIAGNOSIS — I10 Essential (primary) hypertension: Secondary | ICD-10-CM

## 2023-02-25 DIAGNOSIS — M810 Age-related osteoporosis without current pathological fracture: Secondary | ICD-10-CM

## 2023-02-25 NOTE — Progress Notes (Signed)
Pharmacy Note  02/25/2023 Name: [name] MRN: [id number] DOB: 1941/01/12  Subjective: [name] is a 82 y.o. year old female who is a primary care patient of Sandford Craze, NP. Clinical Pharmacist Practitioner referral was placed to assist with medication management.    Engaged with patient by telephone for follow up visit today.  Hyperlipidemia:   Current therapy: ezetimibe 10mg  daily.  Previous medication tried: atorvastatin and rosuvastatin - both caused muscle pain; fenofibrate - topped due to carpopedal spasms.  Patient has pre diabetes and would like to see LDL < 100.   Diet: has almost eliminated sugar from diet. Eating lots of vegetables and lean protein.  She has changed diet significantly over the last 16 months and has lost about 35 to 40 lbs.    Hypertension:  Current therapy: amlodipine 10mg  daily, hydralazine 25mg  3 times a day and metoprolol ER 50mg  - 1.5 tabs = 75mg  daily.   Past medications: tried lisinopril 40mg  but potasium increased so was stopped 11/07/2021.  She has a home blood pressure monitor but does not check daily.  Last blood pressure was slightly elevated at last office visit but per patient when rechecked by provider blood pressure was improved. I didn't see a separate blood pressure noted.   BP Readings from Last 3 Encounters:  02/08/23 (!) 144/65  12/11/22 136/61  11/05/22 (!) 123/56   Hyponatremia - last serum sodium was in the normal range at 137. Improved from 130.   Pre - Diabetes:  No current pharmacotherapy for blood glucose.  Seen at healthy weight loss clinic.  Following low sugar and low fat diet. Has lost about 35 to 40 lbs.   Osteoporosis: Received Prolia 02/21/2022. Taking calcium and vitamin D supplementation.  Patient was due to have Prolia injection 08/23/2022 but she requeseted to wait until after her DEXA to decide if she will continue Prolia due to the high cost of Prolia. or not. Previous cost had  increased to about $260 to $320 for physician buy and bill. At our last visit we had discussed that purchasing thru her [employer] pharmacy benefits would be cheaper at $200 per dose.   Recent falls - 2. First one was due to neighbors child running into her and second was at church. She did not have injuries. She has started using cane and this has helped with balance.   Exercise: Not doing regularly due to recent fall. She is walking a few days per week. Sometimes using resistance chair.   DEXA results from 09/06/2022  Left Femur Total 09/06/2022 Normal T-Score = -0.2  Left Femur Total 08/08/2020 Normal T-Score = -0.3  Left Femur Total 07/23/2018 Normal T-Score = -0.5  Left Femur Total 02/01/2015 Normal T-Score = -0.6    Right Forearm Radius 33% 09/06/2022 Osteoporosis T-Score = -4.6  Right Forearm Radius 33% 09/06/2022 Osteoporosis T-Score = -4.3  Right Forearm Radius 33% 08/08/2020 Osteoporosis T-Score = -3.8  Right Forearm Radius 33% 07/23/2018 77.2 Osteoporosis T-Score = -3.6    This patient is considered osteoporotic according to World Health Organization [facility]) criteria. Compared with the prior study on, 08/08/2020 the BMD of the total mean shows no statistically signficant change. The T-Score at the right forearm / radius did show a decrease.  Right hip was not used due to hip hardware. Lumbar spine was not utilized due to advanced degenerative changes. The scan quality is good.  FOLLOW-UP: Patients with diagnosis of osteoporosis or at high risk for fracture should  have regular bone mineral density tests. For patients eligible for Medicare, routine testing is allowed once every 2 years. The testing frequency can be increased to one year for patients who have rapidly progressing disease, those who are receiving or discontinuing medical therapy to restore bone mass, or have additional risk factors.   I have reviewed this report, and agree with the above findings.    [facility]   Electronically Signed   By: Romona Curls M.D.   On: 09/06/2022 14:58    Objective: Review of patient status, including review of consultants reports, laboratory and other test data, was performed as part of comprehensive evaluation and provision of chronic care management services.   Lab Results  Component Value Date   CREATININE 0.64 02/08/2023   CREATININE 0.66 04/09/2022   CREATININE 0.70 11/22/2021    Lab Results  Component Value Date   HGBA1C 5.6 02/08/2023       Component Value Date/Time   CHOL 240 (H) 08/08/2022 1012   CHOL 273 (H) 08/29/2021 1127   TRIG 111.0 08/08/2022 1012   HDL 68.20 08/08/2022 1012   HDL 75 08/29/2021 1127   CHOLHDL 4 08/08/2022 1012   VLDL 22.2 08/08/2022 1012   LDLCALC 150 (H) 08/08/2022 1012   LDLCALC 168 (H) 08/29/2021 1127   LDLCALC 170 (H) 02/01/2020 0834   LDLDIRECT 189.0 02/06/2021 0909     Clinical ASCVD: No  The ASCVD Risk score (Arnett DK, et al., 2019) failed to calculate for the following reasons:   The 2019 ASCVD risk score is only valid for ages 61 to 39    BP Readings from Last 3 Encounters:  02/08/23 (!) 144/65  12/11/22 136/61  11/05/22 (!) 123/56     Allergies  Allergen Reactions   Atorvastatin Other (See Comments)    REACTION: MUSCLE PAINS   Diclofenac-Misoprostol Diarrhea and Nausea And Vomiting   Fenofibrate Other (See Comments)    REACTION: carpopedal spasms   Rosuvastatin Other (See Comments)    REACTION: MUSCLE PAINS   Statins Other (See Comments)    myalgias   Amoxicillin-Pot Clavulanate Other (See Comments)    REACTION: unspecified   Penicillins Hives   Sulfonamide Derivatives Hives   Tuberculin Ppd Other (See Comments)    Redness at sight    Medications Reviewed Today     Reviewed by Henrene Pastor, RPH-CPP (Pharmacist) on 02/25/23 at 0936  Med List Status: <None>   Medication Order Taking? Sig Documenting Provider Last Dose Status Informant  amLODipine (NORVASC)  10 MG tablet [id number] Yes Take 1 tablet (10 mg total) by mouth daily. Sandford Craze, NP Taking Active   Ascorbic Acid (VITAMIN C) 1000 MG tablet [id number] Yes Take 1,000 mg by mouth daily. [provider] Taking Active Self  aspirin (ASPIRIN 81) 81 MG EC tablet [id number] Yes Take 1 tablet (81 mg total) by mouth daily. Swallow whole. Sandford Craze, NP Taking Active   Calcium Carb-Cholecalciferol (CALCIUM 600+D) 600-10 MG-MCG TABS [id number] Yes Take 600 mg by mouth daily. [provider] Taking Active   carboxymethylcellulose (REFRESH PLUS) 0.5 % SOLN [id number]  Place 1 drop into both eyes 2 (two) times daily. [provider]  Active Self  Cholecalciferol (VITAMIN D3) 50 MCG (2000 UT) capsule [id number] Yes Take 1 capsule (2,000 Units total) by mouth daily. Sandford Craze, NP Taking Active   Coenzyme Q10 200 MG capsule [id number] Yes Take 200 mg by mouth daily. [provider] Taking Active Self  denosumab (PROLIA) 60 MG/ML SOSY injection  [id number] Yes Inject 60 mg into the skin every 6 (six) months. [provider] Taking Active   ezetimibe (ZETIA) 10 MG tablet [id number] Yes Take 1 tablet (10 mg total) by mouth daily. Sandford Craze, NP Taking Active   hydrALAZINE (APRESOLINE) 25 MG tablet [id number] Yes Take 1 tablet (25 mg total) by mouth 3 (three) times daily. Sandford Craze, NP Taking Active   ibuprofen (ADVIL,MOTRIN) 200 MG tablet [id number] Yes Take 400 mg by mouth daily. [provider] Taking Active   Magnesium 400 MG TABS [id number] Yes Take 400 mg by mouth daily. [provider] Taking Active   metoprolol succinate (TOPROL-XL) 50 MG 24 hr tablet [id number] Yes Take 1.5 tablets (75 mg total) by mouth daily.Take with or immediately following a meal. Sandford Craze, NP Taking Active   Multiple Vitamin (MULTIVITAMIN) tablet [id number] Yes Take 1 tablet by mouth daily. [provider] Taking Active Self   niacin 500 MG tablet [id number] Yes Take 500 mg by mouth 2 (two) times daily with a meal. [provider] Taking Active Self           Med Note Katrinka Blazing   Fri Apr 29, 2020  9:20 AM)    Omega-3 Fatty Acids (FISH OIL) 1000 MG CAPS [id number] Yes Take 1 capsule by mouth every morning. [facility]) [provider] Taking Active Self  omeprazole (PRILOSEC) 40 MG capsule [id number] Yes Take 1 capsule (40 mg total) by mouth daily. Sandford Craze, NP Taking Active             Patient Active Problem List   Diagnosis Date Noted   Fall 02/08/2023   BMI 26.0-26.9,adult-current bmi 26.0 06/25/2022   Primary hypertension 06/25/2022   Other constipation 06/25/2022   Tongue pain 05/18/2022   Other hyperlipidemia 05/02/2022   Class 1 obesity with serious comorbidity and body mass index (BMI) of 33.0 to 33.9 in adult 05/02/2022   Hyperkalemia 11/22/2021   Prediabetes 10/12/2021   Vitamin D deficiency 10/12/2021   Leukocytosis 09/05/2021   Hyperalbuminemia 09/05/2021   Mixed hyperlipidemia 08/29/2021   Lumbar radiculopathy, chronic 08/25/2021   Preventative health care 12/14/2014   Hyponatremia 02/20/2014   Hypokalemia 02/20/2014   Hyperglycemia 11/17/2013   Need for prophylactic vaccination and inoculation against influenza 02/08/2012   HYPERCHOLESTEROLEMIA 04/01/2008   BARRETTS ESOPHAGUS 04/01/2008   Osteoarthritis 04/01/2008   URINARY INCONTINENCE 04/01/2008   TOBACCO ABUSE, HX OF 04/01/2008   GERD 09/24/2007   Essential hypertension 04/01/2007   Osteoporosis 05/31/2006   History of colonic polyps 05/31/2006   NEPHROLITHIASIS, HX OF 05/31/2006     Medication Assistance:  None required.  Patient affirms current coverage meets needs.   Assessment / Plan: Hyperlipidemia: last LDL and Tg were not at goal Continue ezetimibe 10mg  daily.  Could consider Nexlizet (combo of Nexlitol and ezetimibe). Would need prior authorization and is tier 4 with her current  insurance / HTA ( for 2025 copay would be $100 / 30 days or $250 / 90 days but we could see if she would qualify for [employer]). Prolia + Nexlizet would likely cause her to reach $2000 out of pocket max of $2000 around September, then all meds would cost $0)   Hypertension: blood pressure goal < 140/90; last 2 or 3 office BPs were at goal.  Continue amlodipine 10mg  daily, hydralazine 25mg  3 times a day and metoprolol ER 50mg  - 1.5 tabs = 75mg  daily.  Recommeded that she check blood pressure 2 or 3 times per week  and record.   Hyponatremia - last serum sodium was back to normal range.  Continue to monitor and avoid medications that lower sodium level.   Pre - Diabetes: Last A1c at goal of < 5.8 Continue to follow up with weight management clinic. Patient is doing great.  Continue low sugar and low fat diet.  Discussed increasing exercise.   Osteoporosis:  Continue Prolia - next due 08/14/2023 Discussed that she could continue to get Prolia thru her pharmacy benefits and cost would be $250 per injection for 2025 if she continues with the same Medicare plan - [employer].  Continue calcium and vitamin D supplementation. Continue weight bearing and resistance exercise  Reviewed potential medications costs for 2025 if she contiues with current plan.  All current meds on her list would be $0 except ezetimibe would be around $12.50 per 90 days and Prolia would be $250 every 6 months.   Follow Up:  No further follow up needed. Office staff is monitoring Prolia injections. Patient can contact Clinical Pharmacist Practitioner at anytime with medication related questions.    Henrene Pastor, PharmD Clinical Pharmacist [facility]  - [facility] [phone]

## 2023-03-07 ENCOUNTER — Encounter (INDEPENDENT_AMBULATORY_CARE_PROVIDER_SITE_OTHER): Payer: Self-pay | Admitting: Family Medicine

## 2023-03-07 ENCOUNTER — Ambulatory Visit (INDEPENDENT_AMBULATORY_CARE_PROVIDER_SITE_OTHER): Payer: PPO | Admitting: Family Medicine

## 2023-03-07 VITALS — BP 144/69 | HR 66 | Temp 98.6°F | Ht 60.0 in | Wt 127.0 lb

## 2023-03-07 DIAGNOSIS — E782 Mixed hyperlipidemia: Secondary | ICD-10-CM

## 2023-03-07 DIAGNOSIS — R7303 Prediabetes: Secondary | ICD-10-CM

## 2023-03-07 DIAGNOSIS — E669 Obesity, unspecified: Secondary | ICD-10-CM | POA: Diagnosis not present

## 2023-03-07 DIAGNOSIS — I10 Essential (primary) hypertension: Secondary | ICD-10-CM | POA: Diagnosis not present

## 2023-03-07 DIAGNOSIS — E559 Vitamin D deficiency, unspecified: Secondary | ICD-10-CM

## 2023-03-07 DIAGNOSIS — Z6824 Body mass index (BMI) 24.0-24.9, adult: Secondary | ICD-10-CM

## 2023-03-07 DIAGNOSIS — E66811 Obesity, class 1: Secondary | ICD-10-CM

## 2023-03-07 NOTE — Progress Notes (Signed)
[name], D.O.  ABFM, ABOM Specializing in Clinical Bariatric Medicine  Office located at: [address] [place], [place]  [address]   Assessment and Plan:   Prediabetes Assessment & Plan: No current meds. Diet/exercise approach. Pt had Hemoglobin A1c & CMET with PCP on 02/08/23. HgB A1c improved from 5.7 to 5.6. Kidney function, electrolytes, and liver function are within recommended limits.  Continue to decrease simple carbs/ sugars; increase fiber and proteins -> follow her meal plan. Continue weight loss therapy.    Essential hypertension Assessment & Plan: Last 3 blood pressure readings in our office are as follows: BP Readings from Last 3 Encounters:  03/07/23 (!) 144/69  02/08/23 (!) 144/65  12/11/22 136/61   Hypertension treated with Norvasc, Apresoline, and Toprol-XL. Blood pressure  stable today; no concerns in this regard. Pt completely asymptomatic. Continue with all antihypertensives per PCP. Continue with Prudent nutritional plan and low sodium diet, advance exercise as tolerated. Will continue to monitor condition.    Mixed hyperlipidemia Assessment & Plan: Condition treated with Zetia 10 mg daily and Krill Oil. Reviewed LDL panel from 08/08/22. LDL cholesterol elevated at 150. However, HDL is at goal at 68.2. Continuing with our prudent nutritional plan that is low in saturated and trans fats, and low in fatty carbs can improve LDL. Continue with resistance training. Continue Zetia and Krill Oil.    Vitamin D deficiency Assessment & Plan: Most recent vitamin D of 55.56 on 08/08/22. No issues with OTC vitamin D 2,000 units daily and Calcium Carb-Cholecalciferol 600 mg daily. Continue with weight loss efforts and current supplementation regiment.    TREATMENT PLAN FOR OBESITY: Obesity, current BMI 24.8 Obesity (BMI 30.0-34.9) - starting BMI 33.2 Assessment & Plan: [name] is here to discuss her progress with her obesity treatment plan along  with follow-up of her obesity related diagnoses. See Medical Weight Management Flowsheet for complete bioelectrical impedance results.  Since last office visit on 12/11/22 patient's muscle mass has increased by 1.2 lb. Fat mass has decreased by 0.6 lb. Total body water has decreased by 2.6 lb.  Counseling done on how various foods will affect these numbers and how to maximize success  Total lbs lost to date: 43 lbs  Total weight loss percentage to date: 25.29%   No change to meal plan - see Subjective  Discussed with pt that learning new things (e.g learning new language, taking free classes at [facility], word searches) is important for keeping mind sharp.  Behavioral Intervention Additional resources provided today: Benefits of losing 5-10% of weight handout Evidence-based interventions for health behavior change were utilized today including the discussion of self monitoring techniques, problem-solving barriers and SMART goal setting techniques.   Regarding patient's less desirable eating habits and patterns, we employed the technique of small changes.  Pt will specifically work on: trying different brain activities for mind health   FOLLOW UP: Return 04/18/23. She was informed of the importance of frequent follow up visits to maximize her success with intensive lifestyle modifications for her multiple health conditions.  Subjective:   Chief complaint: Obesity [name] is here to discuss her progress with her obesity treatment plan. She is on the Category 2 Plan and states she is following her eating plan approximately 90% of the time. She states she is doing resistance training 30 minutes, 6 days a week.  Interval History:  [name] is here for a follow up office visit. Since last OV, [name] has been doing  well. Reports good adherence to her prudent nutritional plan. Once in while, she has cravings. Pt desires to lose 3 lbs by next OV.  For her "brain health", pt does bible study 30-45  minutes daily.   Review of Systems:  Pertinent positives were addressed with patient today.  Reviewed by clinician on day of visit: allergies, medications, problem list, medical history, surgical history, family history, social history, and previous encounter notes.  Weight Summary and Biometrics   Weight Lost Since Last Visit: 0lb  Weight Gained Since Last Visit: 1   Vitals Temp: 98.6 F (37 C) BP: (!) 144/69 Pulse Rate: 66 SpO2: 97 %   Anthropometric Measurements Height: 5' (1.524 m) Weight: 127 lb (57.6 kg) BMI (Calculated): 24.8 Weight at Last Visit: 126lb Weight Lost Since Last Visit: 0lb Weight Gained Since Last Visit: 1 Starting Weight: 170lb Total Weight Loss (lbs): 43 lb (19.5 kg) Peak Weight: 190lb   Body Composition  Body Fat %: 39.3 % Fat Mass (lbs): 50 lbs Muscle Mass (lbs): 73.2 lbs Total Body Water (lbs): 57 lbs Visceral Fat Rating : 11   Other Clinical Data Fasting: no Labs: no Today's Visit #: 16 Starting Date: 08/29/21   Objective:   PHYSICAL EXAM: Blood pressure (!) 144/69, pulse 66, temperature 98.6 F (37 C), height 5' (1.524 m), weight 127 lb (57.6 kg), last menstrual period 05/14/1968, SpO2 97%. Body mass index is 24.8 kg/m.  General: Well Developed, well nourished, and in no acute distress.  HEENT: Normocephalic, atraumatic Skin: Warm and dry, cap RF less 2 sec, good turgor Chest:  Normal excursion, shape, no gross abn Respiratory: speaking in full sentences, no conversational dyspnea NeuroM-Sk: Ambulates with cane, moves * 4 Psych: A and O *3, insight good, mood-full  DIAGNOSTIC DATA REVIEWED:  BMET    Component Value Date/Time   NA 136 02/08/2023 0854   NA 130 (L) 08/29/2021 1127   K 3.9 02/08/2023 0854   CL 103 02/08/2023 0854   CO2 24 02/08/2023 0854   GLUCOSE 94 02/08/2023 0854   BUN 13 02/08/2023 0854   BUN 14 08/29/2021 1127   CREATININE 0.64 02/08/2023 0854   CREATININE 0.72 02/01/2020 0834   CALCIUM 9.6  02/08/2023 0854   GFRNONAA 82 (L) 02/17/2014 0545   GFRNONAA 84 11/17/2013 0915   GFRAA >90 02/17/2014 0545   GFRAA >89 11/17/2013 0915   Lab Results  Component Value Date   HGBA1C 5.6 02/08/2023   HGBA1C 6.8 (H) 06/23/2009   Lab Results  Component Value Date   INSULIN 10.0 08/29/2021   Lab Results  Component Value Date   TSH 3.530 08/29/2021   CBC    Component Value Date/Time   WBC 12.3 (H) 09/05/2021 0927   RBC 4.35 09/05/2021 0927   HGB 13.0 09/05/2021 0927   HGB 13.5 08/29/2021 1127   HCT 38.9 09/05/2021 0927   HCT 39.8 08/29/2021 1127   PLT 384.0 09/05/2021 0927   PLT 530 (H) 08/29/2021 1127   MCV 89.6 09/05/2021 0927   MCV 88 08/29/2021 1127   MCH 29.7 08/29/2021 1127   MCH 29.3 02/17/2014 0545   MCHC 33.4 09/05/2021 0927   RDW 13.8 09/05/2021 0927   RDW 13.2 08/29/2021 1127   Iron Studies No results found for: "IRON", "TIBC", "FERRITIN", "IRONPCTSAT" Lipid Panel     Component Value Date/Time   CHOL 240 (H) 08/08/2022 1012   CHOL 273 (H) 08/29/2021 1127   TRIG 111.0 08/08/2022 1012   HDL 68.20 08/08/2022 1012  HDL 75 08/29/2021 1127   CHOLHDL 4 08/08/2022 1012   VLDL 22.2 08/08/2022 1012   LDLCALC 150 (H) 08/08/2022 1012   LDLCALC 168 (H) 08/29/2021 1127   LDLCALC 170 (H) 02/01/2020 0834   LDLDIRECT 189.0 02/06/2021 0909   Hepatic Function Panel     Component Value Date/Time   PROT 6.6 02/08/2023 0854   PROT 7.3 08/29/2021 1127   ALBUMIN 4.4 02/08/2023 0854   ALBUMIN 5.0 (H) 08/29/2021 1127   AST 18 02/08/2023 0854   ALT 14 02/08/2023 0854   ALKPHOS 58 02/08/2023 0854   BILITOT 0.5 02/08/2023 0854   BILITOT 0.3 08/29/2021 1127   BILIDIR 0.1 11/17/2013 0915   IBILI 0.3 11/17/2013 0915      Component Value Date/Time   TSH 3.530 08/29/2021 1127   Nutritional Lab Results  Component Value Date   VD25OH 55.56 08/08/2022   VD25OH 90.14 04/09/2022   VD25OH 59.5 08/29/2021   Attestations:   I, Special Puri, acting as a Stage manager  for Thomasene Lot, DO., have compiled all relevant documentation for today's office visit on behalf of Thomasene Lot, DO, while in the presence of [employer], DO.  I have reviewed the above documentation for accuracy and completeness, and I agree with the above. [name], D.O.  The 21st Century Cures Act was signed into law in 2016 which includes the topic of electronic health records.  This provides immediate access to information in MyChart.  This includes consultation notes, operative notes, office notes, lab results and pathology reports.  If you have any questions about what you read please let us know at your next visit so we can discuss your concerns and take corrective action if need be.  We are right here with you.

## 2023-03-07 NOTE — Patient Instructions (Signed)
The ASCVD Risk score (Arnett DK, et al., 2019) failed to calculate for the following reasons:    The 2019 ASCVD risk score is only valid for ages 40 to 79

## 2023-03-12 ENCOUNTER — Other Ambulatory Visit (HOSPITAL_BASED_OUTPATIENT_CLINIC_OR_DEPARTMENT_OTHER): Payer: Self-pay

## 2023-03-12 ENCOUNTER — Other Ambulatory Visit: Payer: Self-pay | Admitting: Family

## 2023-03-12 MED ORDER — EZETIMIBE 10 MG PO TABS
10.0000 mg | ORAL_TABLET | Freq: Every day | ORAL | 1 refills | Status: DC
  Filled 2023-03-12: qty 90, 90d supply, fill #0
  Filled 2023-04-02 – 2023-06-14 (×2): qty 90, 90d supply, fill #1

## 2023-03-21 ENCOUNTER — Other Ambulatory Visit: Payer: Self-pay

## 2023-04-02 ENCOUNTER — Other Ambulatory Visit (HOSPITAL_BASED_OUTPATIENT_CLINIC_OR_DEPARTMENT_OTHER): Payer: Self-pay

## 2023-04-05 ENCOUNTER — Other Ambulatory Visit (HOSPITAL_BASED_OUTPATIENT_CLINIC_OR_DEPARTMENT_OTHER): Payer: Self-pay

## 2023-04-13 ENCOUNTER — Other Ambulatory Visit: Payer: Self-pay | Admitting: Family

## 2023-04-13 ENCOUNTER — Other Ambulatory Visit: Payer: Self-pay

## 2023-04-13 MED ORDER — AMLODIPINE BESYLATE 10 MG PO TABS
10.0000 mg | ORAL_TABLET | Freq: Every day | ORAL | 1 refills | Status: DC
  Filled 2023-04-13: qty 90, 90d supply, fill #0
  Filled 2023-07-11: qty 90, 90d supply, fill #1

## 2023-04-14 ENCOUNTER — Other Ambulatory Visit (HOSPITAL_BASED_OUTPATIENT_CLINIC_OR_DEPARTMENT_OTHER): Payer: Self-pay

## 2023-04-18 ENCOUNTER — Encounter (INDEPENDENT_AMBULATORY_CARE_PROVIDER_SITE_OTHER): Payer: Self-pay | Admitting: Family Medicine

## 2023-04-18 ENCOUNTER — Ambulatory Visit (INDEPENDENT_AMBULATORY_CARE_PROVIDER_SITE_OTHER): Payer: PPO | Admitting: Family Medicine

## 2023-04-18 VITALS — BP 129/79 | HR 76 | Temp 98.3°F | Ht 60.0 in | Wt 132.0 lb

## 2023-04-18 DIAGNOSIS — E66811 Obesity, class 1: Secondary | ICD-10-CM

## 2023-04-18 DIAGNOSIS — Z6833 Body mass index (BMI) 33.0-33.9, adult: Secondary | ICD-10-CM | POA: Diagnosis not present

## 2023-04-18 DIAGNOSIS — H00014 Hordeolum externum left upper eyelid: Secondary | ICD-10-CM

## 2023-04-18 DIAGNOSIS — E669 Obesity, unspecified: Secondary | ICD-10-CM

## 2023-04-18 DIAGNOSIS — I10 Essential (primary) hypertension: Secondary | ICD-10-CM | POA: Diagnosis not present

## 2023-04-18 NOTE — Progress Notes (Signed)
[name], D.O.  ABFM, ABOM Specializing in Clinical Bariatric Medicine  Office located at: [address] [place], [place]  [address]   Assessment and Plan:   FOR THE DISEASE OF OBESITY: Obesity, current BMI 25.78 Obesity (BMI 30.0-34.9) - starting BMI 33.2 Assessment & Plan: Since last office visit on 03/07/23 patient's muscle mass has increased by 0.6 lb. Fat mass has increased by 4.2 lb. Total body water has increased by 3.2 lb.  Counseling done on how various foods will affect these numbers and how to maximize success  Total lbs lost to date: 38 lbs  Total weight loss percentage to date: 22.35%    Recommended Dietary Goals Breianna is currently in the action stage of change. As such, her goal is to continue weight management plan.  She has agreed to: continue current plan   Behavioral Intervention We discussed the following today: staying on track while traveling and vacationing and celebration eating strategies  Additional resources provided today: Handout on holiday eating strategies  Evidence-based interventions for health behavior change were utilized today including the discussion of self monitoring techniques, problem-solving barriers and SMART goal setting techniques.   Regarding patient's less desirable eating habits and patterns, we employed the technique of small changes.   Pt will specifically work on: n/a   Recommended Physical Activity Goals Maylah has been advised to work up to 150 minutes of moderate intensity aerobic activity a week and strengthening exercises 2-3 times per week for cardiovascular health, weight loss maintenance and preservation of muscle mass.   She has agreed to : Continue current level of physical activity    Pharmacotherapy We both agreed to : continue current anti-obesity medication regimen   FOR ASSOCIATED CONDITIONS ADDRESSED TODAY:  Essential hypertension Assessment & Plan: Last 3 blood pressure readings in  our office are as follows: BP Readings from Last 3 Encounters:  04/18/23 129/79  03/07/23 (!) 144/69  02/08/23 (!) 144/65   Hypertension treated with Norvasc, Apresoline, and Toprol-XL. Blood pressure  stable today; no concerns in this regard. She will maintain with all medications per PCP. Continue with low sodium diet.   Hordeolum externum left upper eyelid Assessment & Plan: Pt has had a hordeolum in the left upper eyelid for about a week now. She denies fevers and chills.   I recommended pt to place a warm wash cloth over her eye several times a day. Also advised her to do a gentle massage from the lateral aspect of the the upper eye-lid to the medial aspect to enhance drainage.    Follow up:   Return 05/29/23. She was informed of the importance of frequent follow up visits to maximize her success with intensive lifestyle modifications for her multiple health conditions.  Subjective:   Chief complaint: Obesity Jerilynn is here to discuss her progress with her obesity treatment plan. She is on the the Category 2 Plan and states she is following her eating plan approximately 80% of the time. She states she is not exercising.  Interval History:  TAMIKE SEGGERMAN is here for a follow up office visit. Since last OV,  Joddie is up 5 lbs. She had two pleasant vacations to [place] and [place]. Food wise, she endorses doing excess eating while on vacation. Tried to be protein focused. Has an upcoming trip to [place].   Pharmacotherapy for weight loss: She is currently taking no anti-obesity medication.   Review of Systems:  Pertinent positives were addressed with patient today.  Reviewed  by clinician on day of visit: allergies, medications, problem list, medical history, surgical history, family history, social history, and previous encounter notes.  Weight Summary and Biometrics   Weight Lost Since Last Visit: 0lb  Weight Gained Since Last Visit: 5lb  Vitals Temp: 98.3 F (36.8  C) BP: 129/79 Pulse Rate: 76 SpO2: 96 %   Anthropometric Measurements Height: 5' (1.524 m) Weight: 132 lb (59.9 kg) BMI (Calculated): 25.78 Weight at Last Visit: 127lb Weight Lost Since Last Visit: 0lb Weight Gained Since Last Visit: 5lb Starting Weight: 170lb Total Weight Loss (lbs): 38 lb (17.2 kg) Peak Weight: 190lb   Body Composition  Body Fat %: 41 % Fat Mass (lbs): 54.2 lbs Muscle Mass (lbs): 73.8 lbs Total Body Water (lbs): 60.2 lbs Visceral Fat Rating : 12   Other Clinical Data Fasting: no Labs: no Today's Visit #: 17 Starting Date: 08/29/21   Objective:   PHYSICAL EXAM: Blood pressure 129/79, pulse 76, temperature 98.3 F (36.8 C), height 5' (1.524 m), weight 132 lb (59.9 kg), last menstrual period 05/14/1968, SpO2 96%. Body mass index is 25.78 kg/m.  General: she is overweight, cooperative and in no acute distress. PSYCH: Has normal mood, affect and thought process.   HEENT: EOMI, sclerae are anicteric. Lungs: Normal breathing effort, no conversational dyspnea. Extremities: Moves * 4 Neurologic: A and O * 3, good insight  DIAGNOSTIC DATA REVIEWED: BMET    Component Value Date/Time   NA 136 02/08/2023 0854   NA 130 (L) 08/29/2021 1127   K 3.9 02/08/2023 0854   CL 103 02/08/2023 0854   CO2 24 02/08/2023 0854   GLUCOSE 94 02/08/2023 0854   BUN 13 02/08/2023 0854   BUN 14 08/29/2021 1127   CREATININE 0.64 02/08/2023 0854   CREATININE 0.72 02/01/2020 0834   CALCIUM 9.6 02/08/2023 0854   GFRNONAA 82 (L) 02/17/2014 0545   GFRNONAA 84 11/17/2013 0915   GFRAA >90 02/17/2014 0545   GFRAA >89 11/17/2013 0915   Lab Results  Component Value Date   HGBA1C 5.6 02/08/2023   HGBA1C 6.8 (H) 06/23/2009   Lab Results  Component Value Date   INSULIN 10.0 08/29/2021   Lab Results  Component Value Date   TSH 3.530 08/29/2021   CBC    Component Value Date/Time   WBC 12.3 (H) 09/05/2021 0927   RBC 4.35 09/05/2021 0927   HGB 13.0 09/05/2021 0927    HGB 13.5 08/29/2021 1127   HCT 38.9 09/05/2021 0927   HCT 39.8 08/29/2021 1127   PLT 384.0 09/05/2021 0927   PLT 530 (H) 08/29/2021 1127   MCV 89.6 09/05/2021 0927   MCV 88 08/29/2021 1127   MCH 29.7 08/29/2021 1127   MCH 29.3 02/17/2014 0545   MCHC 33.4 09/05/2021 0927   RDW 13.8 09/05/2021 0927   RDW 13.2 08/29/2021 1127   Iron Studies No results found for: "IRON", "TIBC", "FERRITIN", "IRONPCTSAT" Lipid Panel     Component Value Date/Time   CHOL 240 (H) 08/08/2022 1012   CHOL 273 (H) 08/29/2021 1127   TRIG 111.0 08/08/2022 1012   HDL 68.20 08/08/2022 1012   HDL 75 08/29/2021 1127   CHOLHDL 4 08/08/2022 1012   VLDL 22.2 08/08/2022 1012   LDLCALC 150 (H) 08/08/2022 1012   LDLCALC 168 (H) 08/29/2021 1127   LDLCALC 170 (H) 02/01/2020 0834   LDLDIRECT 189.0 02/06/2021 0909   Hepatic Function Panel     Component Value Date/Time   PROT 6.6 02/08/2023 0854   PROT 7.3 08/29/2021  1127   ALBUMIN 4.4 02/08/2023 0854   ALBUMIN 5.0 (H) 08/29/2021 1127   AST 18 02/08/2023 0854   ALT 14 02/08/2023 0854   ALKPHOS 58 02/08/2023 0854   BILITOT 0.5 02/08/2023 0854   BILITOT 0.3 08/29/2021 1127   BILIDIR 0.1 11/17/2013 0915   IBILI 0.3 11/17/2013 0915      Component Value Date/Time   TSH 3.530 08/29/2021 1127   Nutritional Lab Results  Component Value Date   VD25OH 55.56 08/08/2022   VD25OH 90.14 04/09/2022   VD25OH 59.5 08/29/2021    Attestations:   I, Special Puri, acting as a Stage manager for Thomasene Lot, DO., have compiled all relevant documentation for today's office visit on behalf of Thomasene Lot, DO, while in the presence of [employer], DO.  I have reviewed the above documentation for accuracy and completeness, and I agree with the above. [name], D.O.  The 21st Century Cures Act was signed into law in 2016 which includes the topic of electronic health records.  This provides immediate access to information in MyChart.  This includes  consultation notes, operative notes, office notes, lab results and pathology reports.  If you have any questions about what you read please let us know at your next visit so we can discuss your concerns and take corrective action if need be.  We are right here with you.

## 2023-05-20 ENCOUNTER — Other Ambulatory Visit: Payer: Self-pay | Admitting: Family

## 2023-05-20 ENCOUNTER — Other Ambulatory Visit (HOSPITAL_BASED_OUTPATIENT_CLINIC_OR_DEPARTMENT_OTHER): Payer: Self-pay

## 2023-05-20 MED ORDER — HYDRALAZINE HCL 25 MG PO TABS
25.0000 mg | ORAL_TABLET | Freq: Three times a day (TID) | ORAL | 0 refills | Status: DC
  Filled 2023-05-20: qty 270, 90d supply, fill #0

## 2023-05-29 ENCOUNTER — Ambulatory Visit (INDEPENDENT_AMBULATORY_CARE_PROVIDER_SITE_OTHER): Payer: PPO | Admitting: Family Medicine

## 2023-06-24 ENCOUNTER — Encounter (INDEPENDENT_AMBULATORY_CARE_PROVIDER_SITE_OTHER): Payer: Self-pay | Admitting: Family Medicine

## 2023-06-24 ENCOUNTER — Ambulatory Visit (INDEPENDENT_AMBULATORY_CARE_PROVIDER_SITE_OTHER): Payer: PPO | Admitting: Family Medicine

## 2023-06-24 VITALS — BP 140/64 | HR 68 | Temp 98.0°F | Ht 62.0 in | Wt 130.0 lb

## 2023-06-24 DIAGNOSIS — I10 Essential (primary) hypertension: Secondary | ICD-10-CM

## 2023-06-24 DIAGNOSIS — E559 Vitamin D deficiency, unspecified: Secondary | ICD-10-CM

## 2023-06-24 DIAGNOSIS — R7303 Prediabetes: Secondary | ICD-10-CM | POA: Diagnosis not present

## 2023-06-24 DIAGNOSIS — E669 Obesity, unspecified: Secondary | ICD-10-CM | POA: Diagnosis not present

## 2023-06-24 DIAGNOSIS — Z6823 Body mass index (BMI) 23.0-23.9, adult: Secondary | ICD-10-CM | POA: Diagnosis not present

## 2023-06-24 DIAGNOSIS — Z6833 Body mass index (BMI) 33.0-33.9, adult: Secondary | ICD-10-CM

## 2023-06-24 DIAGNOSIS — E66811 Obesity, class 1: Secondary | ICD-10-CM

## 2023-06-24 NOTE — Progress Notes (Signed)
[name], D.O.  ABFM, ABOM Specializing in Clinical Bariatric Medicine  Office located at: [address] [place], [place]  [address]   Assessment and Plan:   FOR THE DISEASE OF OBESITY: Obesity, current BMI 23.77 Obesity (BMI 30.0-34.9) - starting BMI 33.2 Assessment & Plan: Since last office visit on 15/09/2023 patient's  Muscle mass has increased by 1.8 lb. Fat mass has decreased by 3.2 lb. Total body water has decreased by 0.6 lb.  Counseling done on how various foods will affect these numbers and how to maximize success  Total lbs lost to date: 40 lbs  Total weight loss percentage to date: 23.53%    Recommended Dietary Goals [name] is currently in the action stage of change. As such, her goal is to continue weight management plan.  She has agreed to: continue current plan   Behavioral Intervention We discussed the following today: continue to work on maintaining a reduced calorie state, getting the recommended amount of protein, incorporating whole foods, making healthy choices, staying well hydrated and practicing mindfulness when eating.  Additional resources provided today: Handout on healthy eating and balanced plate  Evidence-based interventions for health behavior change were utilized today including the discussion of self monitoring techniques, problem-solving barriers and SMART goal setting techniques.   Regarding patient's less desirable eating habits and patterns, we employed the technique of small changes.   Pt will specifically work on: n/a   Recommended Physical Activity Goals [name] has been advised to work up to 150 minutes of moderate intensity aerobic activity a week and strengthening exercises 2-3 times per week for cardiovascular health, weight loss maintenance and preservation of muscle mass.   She has agreed to : Think about enjoyable ways to increase daily physical activity.   Pharmacotherapy We both agreed to : continue with  nutritional and behavioral strategies   FOR ASSOCIATED CONDITIONS ADDRESSED TODAY:  Essential hypertension Assessment & Plan: Last 3 blood pressure readings in our office are as follows: BP Readings from Last 3 Encounters:  06/24/23 (!) 140/64  04/18/23 129/79  03/07/23 (!) 144/69   HTN treated with Toprol-XL 75 mg daily, Norvasc 10 mg daily, and Hydralazine 25 mg three times daily. BP is mildly elevated - pt is completely asymptomatic. Continue medication regimen and low sodium diet, advance exercise as tolerated.    Prediabetes Assessment & Plan: Pt is not on any prediabetic medications. Diet/exercise controlled. Her Hemoglobin A1c is well controlled at 5.6. About once a month, she has cravings and will overeat. She is able to immediately return to healthy eating the next day. Continue balanced diet focusing on protein, fruits, and vegetables while limiting simple carbohydrates.    Vitamin D deficiency Assessment & Plan: Pt is doing well on OTC Cholecaliferol 2,000 units daily.Continue regimen. She is up to date on her bone density exams which she gets every 2 years.  Recheck vitamin D levels periodically.    Follow up:   Return 07/24/2023. She was informed of the importance of frequent follow up visits to maximize her success with intensive lifestyle modifications for her multiple health conditions.  Subjective:   Chief complaint: Obesity [name] is here to discuss her progress with her obesity treatment plan. She is on the Category 2 Plan and states she is following her eating plan approximately 80% of the time. She states she is not exercising.  Interval History:  [name] is here for a follow up office visit. Since last OV on 04/17/2024, [name] is  down 2 lbs. She endorses not exercising because she has been busy taking care of others. Food wise, she is mostly sticking to her meal plan and focusing on her protein intake. She recently got an I-PAD and does brain  exercises like crossword puzzles.  Pharmacotherapy for weight loss: She is currently taking no anti-obesity medication.   Review of Systems:  Pertinent positives were addressed with patient today.  Reviewed by clinician on day of visit: allergies, medications, problem list, medical history, surgical history, family history, social history, and previous encounter notes.  Weight Summary and Biometrics   Weight Lost Since Last Visit: 132 lb  Weight Gained Since Last Visit: 0 lb  Vitals Temp: 98 F (36.7 C) BP: (!) 140/64 Pulse Rate: 68 SpO2: 97 %   Anthropometric Measurements Height: 5\' 2"  (1.575 m) Weight: 130 lb (59 kg) BMI (Calculated): 23.77 Weight at Last Visit: 2 lb Weight Lost Since Last Visit: 132 lb Weight Gained Since Last Visit: 0 lb Starting Weight: 170 lb Total Weight Loss (lbs): 40 lb (18.1 kg) Peak Weight: 190 lb   Body Composition  Body Fat %: 39.1 % Fat Mass (lbs): 51 lbs Muscle Mass (lbs): 75.6 lbs Total Body Water (lbs): 59.6 lbs Visceral Fat Rating : 11   Other Clinical Data Fasting: no Labs: no Today's Visit #: 18 Starting Date: 08/29/21   Objective:   PHYSICAL EXAM: Blood pressure (!) 140/64, pulse 68, temperature 98 F (36.7 C), height 5\' 2"  (1.575 m), weight 130 lb (59 kg), last menstrual period 05/14/1968, SpO2 97%. Body mass index is 23.78 kg/m.  General: she is overweight, cooperative and in no acute distress. PSYCH: Has normal mood, affect and thought process.   HEENT: EOMI, sclerae are anicteric. Lungs: Normal breathing effort, no conversational dyspnea. Extremities: Moves * 4 Neurologic: A and O * 3, good insight  DIAGNOSTIC DATA REVIEWED: BMET    Component Value Date/Time   NA 136 02/08/2023 0854   NA 130 (L) 08/29/2021 1127   K 3.9 02/08/2023 0854   CL 103 02/08/2023 0854   CO2 24 02/08/2023 0854   GLUCOSE 94 02/08/2023 0854   BUN 13 02/08/2023 0854   BUN 14 08/29/2021 1127   CREATININE 0.64 02/08/2023 0854    CREATININE 0.72 02/01/2020 0834   CALCIUM 9.6 02/08/2023 0854   GFRNONAA 82 (L) 02/17/2014 0545   GFRNONAA 84 11/17/2013 0915   GFRAA >90 02/17/2014 0545   GFRAA >89 11/17/2013 0915   Lab Results  Component Value Date   HGBA1C 5.6 02/08/2023   HGBA1C 6.8 (H) 06/23/2009   Lab Results  Component Value Date   INSULIN 10.0 08/29/2021   Lab Results  Component Value Date   TSH 3.530 08/29/2021   CBC    Component Value Date/Time   WBC 12.3 (H) 09/05/2021 0927   RBC 4.35 09/05/2021 0927   HGB 13.0 09/05/2021 0927   HGB 13.5 08/29/2021 1127   HCT 38.9 09/05/2021 0927   HCT 39.8 08/29/2021 1127   PLT 384.0 09/05/2021 0927   PLT 530 (H) 08/29/2021 1127   MCV 89.6 09/05/2021 0927   MCV 88 08/29/2021 1127   MCH 29.7 08/29/2021 1127   MCH 29.3 02/17/2014 0545   MCHC 33.4 09/05/2021 0927   RDW 13.8 09/05/2021 0927   RDW 13.2 08/29/2021 1127   Iron Studies No results found for: "IRON", "TIBC", "FERRITIN", "IRONPCTSAT" Lipid Panel     Component Value Date/Time   CHOL 240 (H) 08/08/2022 1012   CHOL 273 (H)  08/29/2021 1127   TRIG 111.0 08/08/2022 1012   HDL 68.20 08/08/2022 1012   HDL 75 08/29/2021 1127   CHOLHDL 4 08/08/2022 1012   VLDL 22.2 08/08/2022 1012   LDLCALC 150 (H) 08/08/2022 1012   LDLCALC 168 (H) 08/29/2021 1127   LDLCALC 170 (H) 02/01/2020 0834   LDLDIRECT 189.0 02/06/2021 0909   Hepatic Function Panel     Component Value Date/Time   PROT 6.6 02/08/2023 0854   PROT 7.3 08/29/2021 1127   ALBUMIN 4.4 02/08/2023 0854   ALBUMIN 5.0 (H) 08/29/2021 1127   AST 18 02/08/2023 0854   ALT 14 02/08/2023 0854   ALKPHOS 58 02/08/2023 0854   BILITOT 0.5 02/08/2023 0854   BILITOT 0.3 08/29/2021 1127   BILIDIR 0.1 11/17/2013 0915   IBILI 0.3 11/17/2013 0915      Component Value Date/Time   TSH 3.530 08/29/2021 1127   Nutritional Lab Results  Component Value Date   VD25OH 55.56 08/08/2022   VD25OH 90.14 04/09/2022   VD25OH 59.5 08/29/2021    Attestations:    I, Special Puri, acting as a Stage manager for Thomasene Lot, DO., have compiled all relevant documentation for today's office visit on behalf of Thomasene Lot, DO, while in the presence of [employer], DO.  I have reviewed the above documentation for accuracy and completeness, and I agree with the above. [name], D.O.  The 21st Century Cures Act was signed into law in 2016 which includes the topic of electronic health records.  This provides immediate access to information in MyChart.  This includes consultation notes, operative notes, office notes, lab results and pathology reports.  If you have any questions about what you read please let us know at your next visit so we can discuss your concerns and take corrective action if need be.  We are right here with you.

## 2023-06-27 ENCOUNTER — Other Ambulatory Visit: Payer: Self-pay | Admitting: Family

## 2023-06-28 ENCOUNTER — Other Ambulatory Visit (HOSPITAL_BASED_OUTPATIENT_CLINIC_OR_DEPARTMENT_OTHER): Payer: Self-pay

## 2023-06-28 MED ORDER — METOPROLOL SUCCINATE ER 50 MG PO TB24
75.0000 mg | ORAL_TABLET | Freq: Every day | ORAL | 1 refills | Status: DC
  Filled 2023-06-28 – 2023-07-03 (×2): qty 135, 90d supply, fill #0
  Filled 2023-09-27 (×2): qty 135, 90d supply, fill #1

## 2023-07-03 ENCOUNTER — Other Ambulatory Visit (HOSPITAL_BASED_OUTPATIENT_CLINIC_OR_DEPARTMENT_OTHER): Payer: Self-pay

## 2023-07-15 MED ORDER — DENOSUMAB 60 MG/ML ~~LOC~~ SOSY
60.0000 mg | PREFILLED_SYRINGE | SUBCUTANEOUS | Status: AC
Start: 2023-08-14 — End: 2023-08-16
  Administered 2023-08-16: 60 mg via SUBCUTANEOUS

## 2023-07-15 NOTE — Addendum Note (Signed)
 Addended by: Thelma Barge D on: 07/15/2023 08:02 AM   Modules accepted: Orders

## 2023-07-23 ENCOUNTER — Telehealth: Payer: Self-pay

## 2023-07-23 NOTE — Telephone Encounter (Signed)
 Prolia VOB initiated via [email or web address]  Next Prolia inj DUE: 08/13/23

## 2023-07-24 ENCOUNTER — Ambulatory Visit (INDEPENDENT_AMBULATORY_CARE_PROVIDER_SITE_OTHER): Payer: PPO | Admitting: Family Medicine

## 2023-07-24 ENCOUNTER — Telehealth: Payer: Self-pay | Admitting: Family

## 2023-07-24 ENCOUNTER — Encounter (INDEPENDENT_AMBULATORY_CARE_PROVIDER_SITE_OTHER): Payer: Self-pay | Admitting: Family Medicine

## 2023-07-24 ENCOUNTER — Other Ambulatory Visit (HOSPITAL_BASED_OUTPATIENT_CLINIC_OR_DEPARTMENT_OTHER): Payer: Self-pay

## 2023-07-24 VITALS — BP 146/65 | HR 64 | Temp 98.3°F | Ht 62.0 in | Wt 131.0 lb

## 2023-07-24 DIAGNOSIS — I10 Essential (primary) hypertension: Secondary | ICD-10-CM | POA: Diagnosis not present

## 2023-07-24 DIAGNOSIS — R7303 Prediabetes: Secondary | ICD-10-CM | POA: Diagnosis not present

## 2023-07-24 DIAGNOSIS — E559 Vitamin D deficiency, unspecified: Secondary | ICD-10-CM | POA: Diagnosis not present

## 2023-07-24 DIAGNOSIS — Z6823 Body mass index (BMI) 23.0-23.9, adult: Secondary | ICD-10-CM | POA: Diagnosis not present

## 2023-07-24 DIAGNOSIS — Z6833 Body mass index (BMI) 33.0-33.9, adult: Secondary | ICD-10-CM

## 2023-07-24 DIAGNOSIS — J302 Other seasonal allergic rhinitis: Secondary | ICD-10-CM | POA: Diagnosis not present

## 2023-07-24 DIAGNOSIS — E66811 Obesity, class 1: Secondary | ICD-10-CM | POA: Diagnosis not present

## 2023-07-24 MED ORDER — FLUTICASONE PROPIONATE 50 MCG/ACT NA SUSP
NASAL | 2 refills | Status: AC
Start: 2023-07-24 — End: ?
  Filled 2023-07-24: qty 16, 30d supply, fill #0
  Filled 2023-10-10 (×2): qty 16, 30d supply, fill #1
  Filled 2023-11-14: qty 16, 30d supply, fill #2

## 2023-07-24 NOTE — Progress Notes (Signed)
 [name], D.O.  ABFM, ABOM Specializing in Clinical Bariatric Medicine  Office located at: [address] [place], [place]  [address]   Assessment and Plan:   FOR THE DISEASE OF OBESITY: Obesity, current BMI 23.95 Obesity (BMI 30.0-34.9) - starting BMI 33.2 Assessment & Plan: Since last office visit on 06/24/2023, patient's muscle mass has not changed. Fat mass has increased by 0.8 lbs. Total body water has increased by 0.6 lbs.  Counseling done on how various foods will affect these numbers and how to maximize success  Total lbs lost to date: 39 lbs Total weight loss percentage to date: 22.94%    Recommended Dietary Goals [name] is currently in the action stage of change. As such, her goal is to continue weight management plan.  She has agreed to: continue current plan   Behavioral Intervention We discussed the following today: decreasing simple carbohydrates  and practice mindfulness eating and understand the difference between hunger signals and cravings  Additional resources provided today: None  Evidence-based interventions for health behavior change were utilized today including the discussion of self monitoring techniques, problem-solving barriers and SMART goal setting techniques.   Regarding patient's less desirable eating habits and patterns, we employed the technique of small changes.   Pt will specifically work on: n/a   Recommended Physical Activity Goals [name] has been advised to work up to 150 minutes of moderate intensity aerobic activity a week and strengthening exercises 2-3 times per week for cardiovascular health, weight loss maintenance and preservation of muscle mass.   She has agreed to :  Continue current level of physical activity    Pharmacotherapy We both agreed to : continue with nutritional and behavioral strategies   FOR ASSOCIATED CONDITIONS ADDRESSED TODAY:  Essential hypertension Assessment & Plan: BP Readings from  Last 3 Encounters:  07/24/23 (!) 146/65  06/24/23 (!) 140/64  04/18/23 129/79  Relevant medications: Toprol XL 75 mg daily, Norvasc 10 mg daily, and Hydralazine 25 mg three times daily Pt is tolerating all medication well, no adverse SE. She presented with a BP slightly elevated -- pt is asymptomatic. She reports monitoring BP occasionally at home with systolic averaging in the 130s. Ambulatory blood pressure monitoring encouraged. Reminded patient that if they ever feel poorly in any way, to check their blood pressure and pulse as well. Continue current medication regimen and following our low sodium prudent nutritional plan. Will continue to monitor alongside specialist.   Prediabetes Assessment & Plan: [name] is not currently on any medications for PreDM. Diet/exercise controlled. Hunger is well controlled, cravings are generally controlled but she admits to having moments where she gets hungry and will eat until satisfied. Somer endorses this only happens once every few month and lasts for about a day. Additionally, pt has been trying to stay away from sugar to prevent additional pain with arthritis. She snacks on crackers, chips, or nuts when wanting a snack. Continue to practice mindfulness, adhering to RCNP, and exercise to decrease chance of progression to T2DM. Will recheck levels 3 months from last obtained results to monitor condition.    Vitamin D deficiency Assessment & Plan: [name] is on Cholecalciferol 2000 units daily for this condition. She reports receiving Prolia shot since LOV for osteoporosis. Continue supplementation regimen. Will continue to monitor levels as it relates to her weight loss journey.    Seasonal allergic rhinitis, unspecified trigger Assessment & Plan: [name] endorses her allergy symptoms have been more prominent recently. She has symptoms of rhinorrhea,  rhinitis, and trouble breathing. Pt reports using nasal spray for relief. Recommended pt use [facility]  Sinus Rinse to drain sinuses (1/2 bottle per nostril), as well as ensuring face is clean daily after coming from outside before going to bed. Continue using nasal spray (1 spray per nostril after draining) in addition to sinus rinse. Will monitor condition closely.  Relevant Orders: -     Fluticasone Propionate; 1 spray each nostril following sinus rinses twice daily  Dispense: 16 g; Refill: 2  Follow up:   Return in about 6 weeks (around 09/04/2023). She was informed of the importance of frequent follow up visits to maximize her success with intensive lifestyle modifications for her multiple health conditions.  Subjective:   Chief complaint: Obesity [name] is here to discuss her progress with her obesity treatment plan. She is on the the Category 2 Plan and states she is following her eating plan approximately 80% of the time. She states she is doing the resistance chair 30 minutes 5 days.  Interval History:  [name] is here for a follow up office visit. Since last OV on 06/24/2023, Signora is up 1 lb. She recently was traveling, and admits to eating off plan during these times. Sherriann denies skipping meals and has been eating her leftovers more often. She recently cared for great-grandchildren which increased her physical activity.   Pharmacotherapy for weight loss: She is currently taking no anti-obesity medication.   Review of Systems:  Pertinent positives were addressed with patient today.  Reviewed by clinician on day of visit: allergies, medications, problem list, medical history, surgical history, family history, social history, and previous encounter notes.  Weight Summary and Biometrics   Weight Lost Since Last Visit: 0  Weight Gained Since Last Visit: 1 lb   Vitals Temp: 98.3 F (36.8 C) BP: (!) 146/65 Pulse Rate: 64 SpO2: 97 %   Anthropometric Measurements Height: 5\' 2"  (1.575 m) Weight: 131 lb (59.4 kg) BMI (Calculated): 23.95 Weight at Last Visit: 130  lb Weight Lost Since Last Visit: 0 Weight Gained Since Last Visit: 1 lb Starting Weight: 170 lb Total Weight Loss (lbs): 39 lb (17.7 kg) Peak Weight: 190 lb   Body Composition  Body Fat %: 39.4 % Fat Mass (lbs): 51.8 lbs Muscle Mass (lbs): 75.6 lbs Total Body Water (lbs): 60.2 lbs Visceral Fat Rating : 11   Other Clinical Data Fasting: no Labs: no Today's Visit #: 19 Starting Date: 08/29/21    Objective:   PHYSICAL EXAM: Blood pressure (!) 146/65, pulse 64, temperature 98.3 F (36.8 C), height 5\' 2"  (1.575 m), weight 131 lb (59.4 kg), last menstrual period 05/14/1968, SpO2 97%. Body mass index is 23.96 kg/m.  General: she is overweight, cooperative and in no acute distress. PSYCH: Has normal mood, affect and thought process.   HEENT: EOMI, sclerae are anicteric. Lungs: Normal breathing effort, no conversational dyspnea. Extremities: Moves * 4 Neurologic: A and O * 3, good insight  DIAGNOSTIC DATA REVIEWED: BMET    Component Value Date/Time   NA 136 02/08/2023 0854   NA 130 (L) 08/29/2021 1127   K 3.9 02/08/2023 0854   CL 103 02/08/2023 0854   CO2 24 02/08/2023 0854   GLUCOSE 94 02/08/2023 0854   BUN 13 02/08/2023 0854   BUN 14 08/29/2021 1127   CREATININE 0.64 02/08/2023 0854   CREATININE 0.72 02/01/2020 0834   CALCIUM 9.6 02/08/2023 0854   GFRNONAA 82 (L) 02/17/2014 0545   GFRNONAA 84 11/17/2013 0915  GFRAA >90 02/17/2014 0545   GFRAA >89 11/17/2013 0915   Lab Results  Component Value Date   HGBA1C 5.6 02/08/2023   HGBA1C 6.8 (H) 06/23/2009   Lab Results  Component Value Date   INSULIN 10.0 08/29/2021   Lab Results  Component Value Date   TSH 3.530 08/29/2021   CBC    Component Value Date/Time   WBC 12.3 (H) 09/05/2021 0927   RBC 4.35 09/05/2021 0927   HGB 13.0 09/05/2021 0927   HGB 13.5 08/29/2021 1127   HCT 38.9 09/05/2021 0927   HCT 39.8 08/29/2021 1127   PLT 384.0 09/05/2021 0927   PLT 530 (H) 08/29/2021 1127   MCV 89.6  09/05/2021 0927   MCV 88 08/29/2021 1127   MCH 29.7 08/29/2021 1127   MCH 29.3 02/17/2014 0545   MCHC 33.4 09/05/2021 0927   RDW 13.8 09/05/2021 0927   RDW 13.2 08/29/2021 1127   Iron Studies No results found for: "IRON", "TIBC", "FERRITIN", "IRONPCTSAT" Lipid Panel     Component Value Date/Time   CHOL 240 (H) 08/08/2022 1012   CHOL 273 (H) 08/29/2021 1127   TRIG 111.0 08/08/2022 1012   HDL 68.20 08/08/2022 1012   HDL 75 08/29/2021 1127   CHOLHDL 4 08/08/2022 1012   VLDL 22.2 08/08/2022 1012   LDLCALC 150 (H) 08/08/2022 1012   LDLCALC 168 (H) 08/29/2021 1127   LDLCALC 170 (H) 02/01/2020 0834   LDLDIRECT 189.0 02/06/2021 0909   Hepatic Function Panel     Component Value Date/Time   PROT 6.6 02/08/2023 0854   PROT 7.3 08/29/2021 1127   ALBUMIN 4.4 02/08/2023 0854   ALBUMIN 5.0 (H) 08/29/2021 1127   AST 18 02/08/2023 0854   ALT 14 02/08/2023 0854   ALKPHOS 58 02/08/2023 0854   BILITOT 0.5 02/08/2023 0854   BILITOT 0.3 08/29/2021 1127   BILIDIR 0.1 11/17/2013 0915   IBILI 0.3 11/17/2013 0915      Component Value Date/Time   TSH 3.530 08/29/2021 1127   Nutritional Lab Results  Component Value Date   VD25OH 55.56 08/08/2022   VD25OH 90.14 04/09/2022   VD25OH 59.5 08/29/2021    Attestations:   I, Camryn Mix, acting as a Stage manager for [employer], DO., have compiled all relevant documentation for today's office visit on behalf of Thomasene Lot, DO, while in the presence of [employer], DO.  Reviewed by clinician on day of visit: allergies, medications, problem list, medical history, surgical history, family history, social history, and previous encounter notes pertinent to patient's obesity diagnosis.  I have reviewed the above documentation for accuracy and completeness, and I agree with the above. [name], D.O.  The 21st Century Cures Act was signed into law in 2016 which includes the topic of electronic health records.  This provides  immediate access to information in MyChart.  This includes consultation notes, operative notes, office notes, lab results and pathology reports.  If you have any questions about what you read please let us know at your next visit so we can discuss your concerns and take corrective action if need be.  We are right here with you.

## 2023-07-24 NOTE — Telephone Encounter (Signed)
 Copied from CRM [id number]. Topic: Medicare AWV >> Jul 24, 2023  2:27 PM Payton Doughty wrote: Reason for CRM: Called LVM 07/24/2023 to schedule AWV. Please schedule Virtual or Telehealth visits ONLY.   Verlee Rossetti; Care Guide Ambulatory Clinical Support  l [facility] Direct Dial: [phone]

## 2023-07-30 ENCOUNTER — Other Ambulatory Visit: Payer: Self-pay

## 2023-07-31 ENCOUNTER — Other Ambulatory Visit: Payer: Self-pay

## 2023-07-31 ENCOUNTER — Other Ambulatory Visit (HOSPITAL_COMMUNITY): Payer: Self-pay

## 2023-07-31 NOTE — Telephone Encounter (Signed)
 Pt ready for scheduling for Prolia on or after : 08/14/23  Option# 1: Buy/Bill (Office supplied medication)  Out-of-pocket cost due at time of clinic visit: $356  Number of injection/visits approved: --  Primary: [employer] - Medicare Prolia co-insurance: 20% Admin fee co-insurance: 20%  Secondary: N/A Prolia co-insurance:  Admin fee co-insurance:   Medical Benefit Details: Date Benefits were checked: 07/31/23 Deductible: no/ Coinsurance: 20%/ Admin Fee: 20%  Prior Auth: not required PA# Expiration Date:   # of doses approved: ----------------------------------------------------------------------- Option# 2- Med Obtained from pharmacy:  Pharmacy benefit: Copay $250 (Paid to pharmacy) Admin Fee: 20% (Pay at clinic - approximately $25)  Prior Auth: not required PA# Expiration Date:   # of doses approved:   If patient wants fill through the pharmacy benefit please send prescription to:  [facility] , and include estimated need by date in rx notes. Pharmacy will ship medication directly to the office.  Patient not eligible for Prolia Copay Card. Copay Card can make patient's cost as little as $25. Link to apply: https://www.amgensupportplus.com/copay  ** This summary of benefits is an estimation of the patient's out-of-pocket cost. Exact cost may very based on individual plan coverage.

## 2023-08-02 ENCOUNTER — Telehealth: Payer: Self-pay | Admitting: Family

## 2023-08-02 ENCOUNTER — Other Ambulatory Visit (HOSPITAL_COMMUNITY): Payer: Self-pay

## 2023-08-02 NOTE — Telephone Encounter (Signed)
 Copied from CRM [id number]. Topic: Appointments - Scheduling Inquiry for Clinic >> Aug 02, 2023  3:12 PM Isabell A wrote: Reason for CRM: Patient calling to schedule her Prolia injection.   Callback number: [phone]

## 2023-08-06 ENCOUNTER — Other Ambulatory Visit: Payer: Self-pay | Admitting: Family

## 2023-08-06 ENCOUNTER — Other Ambulatory Visit (HOSPITAL_BASED_OUTPATIENT_CLINIC_OR_DEPARTMENT_OTHER): Payer: Self-pay

## 2023-08-06 MED ORDER — OMEPRAZOLE 40 MG PO CPDR
40.0000 mg | DELAYED_RELEASE_CAPSULE | Freq: Every day | ORAL | 1 refills | Status: DC
  Filled 2023-08-06: qty 90, 90d supply, fill #0
  Filled 2023-11-05: qty 90, 90d supply, fill #1

## 2023-08-07 ENCOUNTER — Other Ambulatory Visit: Payer: Self-pay

## 2023-08-07 ENCOUNTER — Other Ambulatory Visit: Payer: Self-pay | Admitting: *Deleted

## 2023-08-07 MED ORDER — DENOSUMAB 60 MG/ML ~~LOC~~ SOSY
60.0000 mg | PREFILLED_SYRINGE | SUBCUTANEOUS | 0 refills | Status: DC
  Filled 2023-08-07: qty 1, 180d supply, fill #0

## 2023-08-07 MED ORDER — DENOSUMAB 60 MG/ML ~~LOC~~ SOSY
60.0000 mg | PREFILLED_SYRINGE | SUBCUTANEOUS | 0 refills | Status: DC
  Filled 2023-08-07: qty 180, fill #0

## 2023-08-07 NOTE — Telephone Encounter (Signed)
 Spoke with pt about cost and rx sent over to [facility].

## 2023-08-07 NOTE — Progress Notes (Signed)
 Specialty Pharmacy Refill Coordination Note  SANTOS HARDWICK is a 83 y.o. female assessed today regarding refills of clinic administered specialty medication(s) Denosumab (PROLIA)   Clinic requested Courier to Provider Office   Delivery date: 08/12/23   Verified address: [facility]   Medication will be filled on 08/09/23.  Per [facility] patient aware of $250 copayment and cc on file.

## 2023-08-09 ENCOUNTER — Other Ambulatory Visit: Payer: Self-pay

## 2023-08-09 ENCOUNTER — Ambulatory Visit (INDEPENDENT_AMBULATORY_CARE_PROVIDER_SITE_OTHER): Payer: PPO | Admitting: Family

## 2023-08-09 VITALS — BP 138/62 | HR 65 | Temp 97.8°F | Resp 16 | Ht 62.0 in | Wt 136.0 lb

## 2023-08-09 DIAGNOSIS — I10 Essential (primary) hypertension: Secondary | ICD-10-CM | POA: Diagnosis not present

## 2023-08-09 DIAGNOSIS — M810 Age-related osteoporosis without current pathological fracture: Secondary | ICD-10-CM | POA: Diagnosis not present

## 2023-08-09 DIAGNOSIS — R7303 Prediabetes: Secondary | ICD-10-CM

## 2023-08-09 DIAGNOSIS — K2270 Barrett's esophagus without dysplasia: Secondary | ICD-10-CM

## 2023-08-09 DIAGNOSIS — E782 Mixed hyperlipidemia: Secondary | ICD-10-CM | POA: Diagnosis not present

## 2023-08-09 DIAGNOSIS — E559 Vitamin D deficiency, unspecified: Secondary | ICD-10-CM

## 2023-08-09 DIAGNOSIS — K219 Gastro-esophageal reflux disease without esophagitis: Secondary | ICD-10-CM | POA: Diagnosis not present

## 2023-08-09 LAB — LIPID PANEL
Cholesterol: 251 mg/dL — ABNORMAL HIGH (ref 0–200)
HDL: 80.1 mg/dL (ref >39.00)
LDL Cholesterol: 153 mg/dL — ABNORMAL HIGH (ref 0–99)
NonHDL: 170.68
Total CHOL/HDL Ratio: 3
Triglycerides: 89 mg/dL (ref 0.0–149.0)
VLDL: 17.8 mg/dL (ref 0.0–40.0)

## 2023-08-09 LAB — COMPREHENSIVE METABOLIC PANEL WITH GFR
ALT: 10 U/L (ref 0–35)
AST: 14 U/L (ref 0–37)
Albumin: 4.3 g/dL (ref 3.5–5.2)
Alkaline Phosphatase: 39 U/L (ref 39–117)
BUN: 16 mg/dL (ref 6–23)
CO2: 25 meq/L (ref 19–32)
Calcium: 9.4 mg/dL (ref 8.4–10.5)
Chloride: 105 meq/L (ref 96–112)
Creatinine, Ser: 0.79 mg/dL (ref 0.40–1.20)
GFR: 69.71 mL/min (ref >60.00)
Glucose, Bld: 98 mg/dL (ref 70–99)
Potassium: 4.7 meq/L (ref 3.5–5.1)
Sodium: 138 meq/L (ref 135–145)
Total Bilirubin: 0.4 mg/dL (ref 0.2–1.2)
Total Protein: 6.5 g/dL (ref 6.0–8.3)

## 2023-08-09 LAB — VITAMIN D 25 HYDROXY (VIT D DEFICIENCY, FRACTURES): VITD: 56.93 ng/mL (ref 30.00–100.00)

## 2023-08-09 LAB — MICROALBUMIN / CREATININE URINE RATIO
Creatinine,U: 54.5 mg/dL
Microalb Creat Ratio: 16.9 mg/g (ref 0.0–30.0)
Microalb, Ur: 0.9 mg/dL (ref 0.0–1.9)

## 2023-08-09 NOTE — Assessment & Plan Note (Addendum)
   Lab Results  Component Value Date   CHOL 240 (H) 08/08/2022   HDL 68.20 08/08/2022   LDLCALC 150 (H) 08/08/2022   LDLDIRECT 189.0 02/06/2021   TRIG 111.0 08/08/2022   CHOLHDL 4 08/08/2022   Update cholesterol, maintained on zetia.

## 2023-08-09 NOTE — Assessment & Plan Note (Signed)
Stable on current dose of omeprazole.

## 2023-08-09 NOTE — Assessment & Plan Note (Signed)
 Continues caltrate with D

## 2023-08-09 NOTE — Progress Notes (Signed)
 Subjective:     Patient ID: [name], female    DOB: [birth year], 83 y.o.   MRN: [id number]  Chief Complaint  Patient presents with   Hypertension    Here for follow up    Hypertension    Discussed the use of AI scribe software for clinical note transcription with the patient, who gave verbal consent to proceed.  History of Present Illness  [name] is an 83 year old female who presents for a follow-up visit.  She is managing hypertension with metoprolol XL 75 mg, hydralazine 25 mg three times a day, and amlodipine 10 mg. Home blood pressure readings are around 140-142 mmHg. Her A1c levels are normal, and she takes Zetia for cholesterol management.  Osteoporosis is managed with calcium and vitamin D supplements, along with Prolia injections.  No recent heartburn issues; the increase in omeprazole has resolved her symptoms. Weight loss and dietary changes have contributed to the improvement in her heartburn.  She has been attending many social functions recently, impacting her diet. She plans to attend her great grandson's birthday party and her son's retirement celebration this weekend.     Health Maintenance Due  Topic Date Due   COVID-19 Vaccine (7 - 2024-25 season) 01/13/2023   Diabetic kidney evaluation - Urine ACR  04/10/2023   HEMOGLOBIN A1C  08/08/2023   Medicare Annual Wellness (AWV)  09/19/2023    Past Medical History:  Diagnosis Date   Allergy    Arthritis    osteoarthritis   Back pain    Barrett's esophagus    Cataract    bilateral cateracts removed   Chronic kidney disease    kidney stones   Chronic obstructive bronchitis (HCC)    Dr Delford Field   Colitis    Colon polyps    GERD (gastroesophageal reflux disease)    History of chicken pox    History of nephrolithiasis    History of transfusion of packed red blood cells    Hypercholesteremia    Hyperlipidemia    Hypertension    IFG (impaired fasting glucose)    Osteoarthritis     Osteoporosis    pt cannot tolerate fosamax   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   Swelling of both lower extremities    Urinary incontinence     Past Surgical History:  Procedure Laterality Date   ABDOMINAL HYSTERECTOMY  1970   APPENDECTOMY     CHOLECYSTECTOMY     EYE SURGERY  2010   FRACTURE SURGERY  2007   JOINT REPLACEMENT  2007   LEG SURGERY  2008   right   TOTAL KNEE ARTHROPLASTY  04/2006   total left knee replacement    Family History  Problem Relation Age of Onset   Coronary artery disease Mother    Arthritis Mother    High blood pressure Mother    Heart disease Mother    High blood pressure Father    Heart disease Father    Asthma Maternal Grandmother    Tuberculosis Other    Colon cancer Neg Hx    Esophageal cancer Neg Hx    Rectal cancer Neg Hx    Stomach cancer Neg Hx     Social History   Socioeconomic History   Marital status: Widowed    Spouse name: Not on file   Number of children: Not on file   Years of education: Not on file   Highest  education level: 10th grade  Occupational History   Occupation: Retired  Tobacco Use   Smoking status: Former    Current packs/day: 0.00    Types: Cigarettes    Start date: 05/14/1952    Quit date: 05/15/1967    Years since quitting: 56.2   Smokeless tobacco: Never  Substance and Sexual Activity   Alcohol use: No   Drug use: No   Sexual activity: Never  Other Topics Concern   Not on file  Social History Narrative   Regular exercise: yes   Social Drivers of Health   Financial Resource Strain: Low Risk  (08/03/2023)   Overall Financial Resource Strain (CARDIA)    Difficulty of Paying Living Expenses: Not hard at all  Food Insecurity: No Food Insecurity (08/03/2023)   Hunger Vital Sign    Worried About Running Out of Food in the Last Year: Never true    Ran Out of Food in the Last Year: Never true  Transportation Needs: No Transportation Needs (08/03/2023)    PRAPARE - Administrator, Civil Service (Medical): No    Lack of Transportation (Non-Medical): No  Physical Activity: Insufficiently Active (08/03/2023)   Exercise Vital Sign    Days of Exercise per Week: 3 days    Minutes of Exercise per Session: 20 min  Stress: No Stress Concern Present (08/03/2023)   [employer] of Occupational Health - Occupational Stress Questionnaire    Feeling of Stress : Not at all  Social Connections: Moderately Integrated (08/03/2023)   Social Connection and Isolation Panel [NHANES]    Frequency of Communication with Friends and Family: More than three times a week    Frequency of Social Gatherings with Friends and Family: More than three times a week    Attends Religious Services: More than 4 times per year    Active Member of [employer] or Organizations: Yes    Attends Banker Meetings: More than 4 times per year    Marital Status: Widowed  Intimate Partner Violence: Not At Risk (09/14/2021)   Humiliation, Afraid, Rape, and Kick questionnaire    Fear of Current or Ex-Partner: No    Emotionally Abused: No    Physically Abused: No    Sexually Abused: No    Outpatient Medications Prior to Visit  Medication Sig Dispense Refill   amLODipine (NORVASC) 10 MG tablet Take 1 tablet (10 mg total) by mouth daily. 90 tablet 1   Ascorbic Acid (VITAMIN C) 1000 MG tablet Take 1,000 mg by mouth daily.     aspirin (ASPIRIN 81) 81 MG EC tablet Take 1 tablet (81 mg total) by mouth daily. Swallow whole. 30 tablet 12   Calcium Carb-Cholecalciferol (CALCIUM 600+D) 600-10 MG-MCG TABS Take 600 mg by mouth daily.     carboxymethylcellulose (REFRESH PLUS) 0.5 % SOLN Place 1 drop into both eyes 2 (two) times daily.     Cholecalciferol (VITAMIN D3) 50 MCG (2000 UT) capsule Take 1 capsule (2,000 Units total) by mouth daily.     Coenzyme Q10 200 MG capsule Take 200 mg by mouth daily.     denosumab (PROLIA) 60 MG/ML SOSY injection Inject 60 mg into the skin every 6  (six) months. Dx code: M81.0.  pt has appt 08/15/23. 1 mL 0   ezetimibe (ZETIA) 10 MG tablet Take 1 tablet (10 mg total) by mouth daily. 90 tablet 1   fluticasone (FLONASE) 50 MCG/ACT nasal spray 1 spray each nostril following sinus rinses twice daily 16 g 2  hydrALAZINE (APRESOLINE) 25 MG tablet Take 1 tablet (25 mg total) by mouth 3 (three) times daily. 270 tablet 0   ibuprofen (ADVIL,MOTRIN) 200 MG tablet Take 400 mg by mouth daily.     Magnesium 400 MG TABS Take 400 mg by mouth daily.     metoprolol succinate (TOPROL-XL) 50 MG 24 hr tablet Take 1.5 tablets (75 mg total) by mouth daily.Take with or immediately following a meal. 135 tablet 1   Multiple Vitamin (MULTIVITAMIN) tablet Take 1 tablet by mouth daily.     niacin 500 MG tablet Take 500 mg by mouth 2 (two) times daily with a meal.     Omega-3 Fatty Acids (FISH OIL) 1000 MG CAPS Take 1 capsule by mouth every morning. (Krill Oil)     omeprazole (PRILOSEC) 40 MG capsule Take 1 capsule (40 mg total) by mouth daily. 90 capsule 1   Facility-Administered Medications Prior to Visit  Medication Dose Route Frequency Provider Last Rate Last Admin   [START ON 08/14/2023] denosumab (PROLIA) injection 60 mg  60 mg Subcutaneous Q6 months Sandford Craze, NP        Allergies  Allergen Reactions   Atorvastatin Other (See Comments)    REACTION: MUSCLE PAINS   Diclofenac-Misoprostol Diarrhea and Nausea And Vomiting   Fenofibrate Other (See Comments)    REACTION: carpopedal spasms   Rosuvastatin Other (See Comments)    REACTION: MUSCLE PAINS   Statins Other (See Comments)    myalgias   Amoxicillin-Pot Clavulanate Other (See Comments)    REACTION: unspecified   Penicillins Hives   Sulfonamide Derivatives Hives   Tuberculin Ppd Other (See Comments)    Redness at sight    ROS    See HPI Objective:    Physical Exam Constitutional:      General: She is not in acute distress.    Appearance: Normal appearance. She is well-developed.   HENT:     Head: Normocephalic and atraumatic.     Right Ear: External ear normal.     Left Ear: External ear normal.  Eyes:     General: No scleral icterus. Neck:     Thyroid: No thyromegaly.  Cardiovascular:     Rate and Rhythm: Normal rate and regular rhythm.     Heart sounds: Normal heart sounds. No murmur heard. Pulmonary:     Effort: Pulmonary effort is normal. No respiratory distress.     Breath sounds: Normal breath sounds. No wheezing.  Musculoskeletal:     Cervical back: Neck supple.  Skin:    General: Skin is warm and dry.  Neurological:     Mental Status: She is alert and oriented to person, place, and time.  Psychiatric:        Mood and Affect: Mood normal.        Behavior: Behavior normal.        Thought Content: Thought content normal.        Judgment: Judgment normal.      BP 138/62   Pulse 65   Temp 97.8 F (36.6 C) (Oral)   Resp 16   Ht 5\' 2"  (1.575 m)   Wt 136 lb (61.7 kg)   LMP 05/14/1968   SpO2 99%   BMI 24.87 kg/m  Wt Readings from Last 3 Encounters:  08/09/23 136 lb (61.7 kg)  07/24/23 131 lb (59.4 kg)  06/24/23 130 lb (59 kg)       Assessment & Plan:   Problem List Items Addressed This Visit  Unprioritized   Vitamin D deficiency - Primary   Continues caltrate with D      Relevant Orders   Vitamin D (25 hydroxy)   Primary hypertension   BP Readings from Last 3 Encounters:  08/09/23 138/62  07/24/23 (!) 146/65  06/24/23 (!) 140/64   Maintained on amlodipine, hydralazine, and metoprolol.        Prediabetes   Lab Results  Component Value Date   HGBA1C 5.6 02/08/2023   HGBA1C 5.7 08/08/2022   HGBA1C 5.8 04/09/2022   Lab Results  Component Value Date   MICROALBUR 1.0 04/09/2022   LDLCALC 150 (H) 08/08/2022   CREATININE 0.64 02/08/2023   A1C have been stable, weight has also been stable.       Relevant Orders   HgB A1c   Urine Microalbumin w/creat. ratio   Osteoporosis   Maintained on Prolia, has upcoming  injection in 1 week.       Mixed hyperlipidemia     Lab Results  Component Value Date   CHOL 240 (H) 08/08/2022   HDL 68.20 08/08/2022   LDLCALC 150 (H) 08/08/2022   LDLDIRECT 189.0 02/06/2021   TRIG 111.0 08/08/2022   CHOLHDL 4 08/08/2022   Update cholesterol, maintained on zetia.       Relevant Orders   Lipid panel   Comp Met (CMET)   GERD   Stable on current dose of omeprazole.       BARRETTS ESOPHAGUS   Continue PPI's.        I am having [name] maintain her Coenzyme Q10, Fish Oil, multivitamin, vitamin C, carboxymethylcellulose, niacin, ibuprofen, aspirin EC, Magnesium, Calcium Carb-Cholecalciferol, Vitamin D3, ezetimibe, amLODipine, hydrALAZINE, metoprolol succinate, fluticasone, omeprazole, and denosumab. We will continue to administer denosumab.  No orders of the defined types were placed in this encounter.

## 2023-08-09 NOTE — Patient Instructions (Signed)
 VISIT SUMMARY:  Today, you came in for a follow-up visit to review your current health conditions and medications. We discussed your blood pressure, osteoporosis, cholesterol levels, and heartburn. You also mentioned your recent social activities and upcoming family events.  YOUR PLAN:  -HYPERTENSION: Hypertension means high blood pressure. Your blood pressure is well-controlled with your current medications, so you should continue taking metoprolol XL 75 mg, hydralazine 25 mg three times a day, and amlodipine 10 mg as prescribed.  -OSTEOPOROSIS: Osteoporosis is a condition where bones become weak and brittle. You are managing it with calcium and vitamin D supplements, along with Prolia injections. We will check your vitamin D level and ensure you get your next Prolia injection as scheduled.  -HYPERLIPIDEMIA: Hyperlipidemia means having high levels of fats (lipids) in your blood, which can increase the risk of heart disease. You are taking Zetia to manage your cholesterol. We will order a lipid panel to assess your cholesterol levels.  -GASTROESOPHAGEAL REFLUX DISEASE (GERD): GERD is a condition where stomach acid frequently flows back into the tube connecting your mouth and stomach, causing heartburn. Your symptoms have improved with omeprazole and weight loss, so you should continue your current omeprazole dosage.  -GENERAL HEALTH MAINTENANCE: We discussed routine health screenings and immunizations appropriate for your age. We will order an A1c test, a comprehensive metabolic panel (CMP), and a urine microalbumin test to monitor your overall health.  INSTRUCTIONS:  Please schedule a follow-up appointment in six months.

## 2023-08-09 NOTE — Progress Notes (Deleted)
 [name] is a 83 y.o. female presents to the office today for Prolia:*** *** injections, per physician's orders. Prolia 60 mg SQ , was administered *** arm today. Patient tolerated injection. Patient next injection due: 6 months, appt made: No- will schedule in 5 months after benefits are ran again Initial injection: no Patient supplied: YES Cam placed for next injection   DOD: ***   ***, CMA

## 2023-08-09 NOTE — Assessment & Plan Note (Signed)
 Lab Results  Component Value Date   HGBA1C 5.6 02/08/2023   HGBA1C 5.7 08/08/2022   HGBA1C 5.8 04/09/2022   Lab Results  Component Value Date   MICROALBUR 1.0 04/09/2022   LDLCALC 150 (H) 08/08/2022   CREATININE 0.64 02/08/2023   A1C have been stable, weight has also been stable.

## 2023-08-09 NOTE — Assessment & Plan Note (Addendum)
 BP Readings from Last 3 Encounters:  08/09/23 138/62  07/24/23 (!) 146/65  06/24/23 (!) 140/64   Maintained on amlodipine, hydralazine, and metoprolol.

## 2023-08-09 NOTE — Assessment & Plan Note (Signed)
Continue PPIs

## 2023-08-09 NOTE — Assessment & Plan Note (Signed)
 Maintained on Prolia, has upcoming injection in 1 week.

## 2023-08-09 NOTE — Assessment & Plan Note (Signed)
 Lab Results  Component Value Date   CHOL 240 (H) 08/08/2022   HDL 68.20 08/08/2022   LDLCALC 150 (H) 08/08/2022   LDLDIRECT 189.0 02/06/2021   TRIG 111.0 08/08/2022   CHOLHDL 4 08/08/2022   Update cholesterol, maintained on zetia.

## 2023-08-09 NOTE — Progress Notes (Signed)
 Established Patient Office Visit  Subjective   Patient ID: JATON EILERS, female    DOB: 1941-03-18  Age: 83 y.o. MRN: [id number]  Chief Complaint  Patient presents with   Hypertension    Here for follow up    Pleasant 83 yo patient here for routine follow-up. Verbalizes no complaints at this time. States she is feeling well overall. She reports adherence to her medication management. She states she exercises regularly using a resistance chair and walking. She states that her diet is typically very good and she is pleased with her weight loss. She states that she has been to multiple social events lately and feels that her weight has increased due to that. She remains committed to healthy eating and exercise.   Hypertension Pertinent negatives include no blurred vision, chest pain or palpitations.    Review of Systems  Constitutional:  Negative for chills and fever.  HENT:  Negative for nosebleeds and sore throat.        Wears hearing aids in each ear  Eyes: Negative.  Negative for blurred vision and double vision.       Wears glasses.  Respiratory:  Negative for cough and wheezing.   Cardiovascular:  Negative for chest pain and palpitations.  Gastrointestinal:  Negative for heartburn and nausea.  Genitourinary:  Negative for dysuria.  Musculoskeletal:  Negative for myalgias.  Skin:  Negative for rash.  Neurological:  Negative for dizziness and weakness.  Endo/Heme/Allergies:  Does not bruise/bleed easily.  Psychiatric/Behavioral:  Negative for depression.       Objective:     BP (!) 149/52 (BP Location: Right Arm, Patient Position: Sitting, Cuff Size: Small)   Pulse 65   Temp 97.8 F (36.6 C) (Oral)   Resp 16   Ht 5\' 2"  (1.575 m)   Wt 136 lb (61.7 kg)   LMP 05/14/1968   SpO2 99%   BMI 24.87 kg/m    Physical Exam Vitals reviewed.  Constitutional:      Appearance: Normal appearance.  HENT:     Head: Normocephalic.     Right Ear: External ear normal.      Left Ear: External ear normal.     Mouth/Throat:     Mouth: Mucous membranes are moist.     Pharynx: Oropharynx is clear.     Comments: Upper and lower dentures in place.  Eyes:     Pupils: Pupils are equal, round, and reactive to light.  Cardiovascular:     Rate and Rhythm: Normal rate and regular rhythm.     Pulses: Normal pulses.     Heart sounds: Normal heart sounds.  Pulmonary:     Effort: Pulmonary effort is normal.     Breath sounds: Normal breath sounds.  Abdominal:     General: Bowel sounds are normal.     Palpations: Abdomen is soft.  Lymphadenopathy:     Cervical: No cervical adenopathy.  Skin:    General: Skin is warm and dry.     Capillary Refill: Capillary refill takes less than 2 seconds.  Neurological:     Mental Status: She is alert and oriented to person, place, and time.  Psychiatric:        Mood and Affect: Mood normal.        Behavior: Behavior normal.      Assessment & Plan:   -Hypertension - stable on amlodipine, metoprolol, and hydralazine  -GERD - stable on omeprazole  -Hypercholesterolemia - repeat lipid panel today. Continue ezetimibe.  -  Osteoporosis - continue denosumab, calcium, and Vitamin D. Continue exercising.   -Prediabetes - CMP, urine microalbumin, and A1C today. Continue healthy diet and exercise.  Cristopher Peru, RN

## 2023-08-10 LAB — HEMOGLOBIN A1C: Hgb A1c MFr Bld: 5.5 % (ref 4.6–6.5)

## 2023-08-12 ENCOUNTER — Encounter: Payer: Self-pay | Admitting: Family

## 2023-08-14 ENCOUNTER — Other Ambulatory Visit: Payer: Self-pay

## 2023-08-14 ENCOUNTER — Other Ambulatory Visit (HOSPITAL_COMMUNITY): Payer: Self-pay

## 2023-08-14 ENCOUNTER — Other Ambulatory Visit: Payer: Self-pay | Admitting: Family

## 2023-08-15 ENCOUNTER — Ambulatory Visit

## 2023-08-15 DIAGNOSIS — M810 Age-related osteoporosis without current pathological fracture: Secondary | ICD-10-CM

## 2023-08-16 ENCOUNTER — Ambulatory Visit (INDEPENDENT_AMBULATORY_CARE_PROVIDER_SITE_OTHER)

## 2023-08-16 DIAGNOSIS — M810 Age-related osteoporosis without current pathological fracture: Secondary | ICD-10-CM | POA: Diagnosis not present

## 2023-08-16 MED ORDER — DENOSUMAB 60 MG/ML ~~LOC~~ SOSY
60.0000 mg | PREFILLED_SYRINGE | SUBCUTANEOUS | Status: DC
Start: 2023-08-16 — End: 2024-02-21

## 2023-08-16 NOTE — Addendum Note (Signed)
 Addended by: CREFT, Feliberto Harts on: 08/16/2023 03:51 PM   Modules accepted: Orders

## 2023-08-16 NOTE — Progress Notes (Signed)
 [name] is a 83 y.o. female presents to the office today for Prolia injections, per physician's orders. Prolia 60 mg SQ , was administered R arm today. Patient tolerated injection. Patient next injection due: 6 months, appt made: No- will schedule in 5 months after benifits are ran again Initial injection: no Patient supplied: YES Cam Placed for next injection.  Creft, Feliberto Harts

## 2023-08-20 ENCOUNTER — Other Ambulatory Visit: Payer: Self-pay | Admitting: Family

## 2023-08-20 ENCOUNTER — Other Ambulatory Visit (HOSPITAL_BASED_OUTPATIENT_CLINIC_OR_DEPARTMENT_OTHER): Payer: Self-pay

## 2023-08-20 MED ORDER — HYDRALAZINE HCL 25 MG PO TABS
25.0000 mg | ORAL_TABLET | Freq: Three times a day (TID) | ORAL | 1 refills | Status: DC
  Filled 2023-08-20: qty 270, 90d supply, fill #0
  Filled 2023-11-14: qty 270, 90d supply, fill #1

## 2023-08-21 ENCOUNTER — Other Ambulatory Visit (HOSPITAL_BASED_OUTPATIENT_CLINIC_OR_DEPARTMENT_OTHER): Payer: Self-pay

## 2023-09-04 ENCOUNTER — Ambulatory Visit (INDEPENDENT_AMBULATORY_CARE_PROVIDER_SITE_OTHER): Admitting: Family Medicine

## 2023-09-04 VITALS — BP 140/72 | HR 63 | Temp 97.7°F | Ht 62.0 in | Wt 132.0 lb

## 2023-09-04 DIAGNOSIS — R7303 Prediabetes: Secondary | ICD-10-CM | POA: Diagnosis not present

## 2023-09-04 DIAGNOSIS — Z6824 Body mass index (BMI) 24.0-24.9, adult: Secondary | ICD-10-CM | POA: Diagnosis not present

## 2023-09-04 DIAGNOSIS — E559 Vitamin D deficiency, unspecified: Secondary | ICD-10-CM | POA: Diagnosis not present

## 2023-09-04 DIAGNOSIS — E782 Mixed hyperlipidemia: Secondary | ICD-10-CM | POA: Diagnosis not present

## 2023-09-04 DIAGNOSIS — Z6833 Body mass index (BMI) 33.0-33.9, adult: Secondary | ICD-10-CM

## 2023-09-04 DIAGNOSIS — E669 Obesity, unspecified: Secondary | ICD-10-CM

## 2023-09-04 DIAGNOSIS — E871 Hypo-osmolality and hyponatremia: Secondary | ICD-10-CM

## 2023-09-04 DIAGNOSIS — E66811 Obesity, class 1: Secondary | ICD-10-CM

## 2023-09-04 NOTE — Progress Notes (Signed)
 [name], D.O.  ABFM, AB[facility] Specializing in Clinical Bariatric Medicine  Office located at: [address] [place], [place]  [address]   Assessment and Plan:  No orders of the defined types were placed in this encounter.  There are no discontinued medications.   No orders of the defined types were placed in this encounter.    FOR THE DISEASE OF OBESITY:  Obesity, current BMI 24.14 Obesity (BMI 30.0-34.9) - starting BMI 33.2 Assessment & Plan: Since last office visit on 07/24/2023, patient's muscle mass has increased by 0.8 lbs. Fat mass has decreased by 0.4 lbs. Total body water has increased by 1.2 lbs.  Counseling done on how various foods will affect these numbers and how to maximize success  Total lbs lost to date: 38 lbs Total weight loss percentage to date: 22.35%    Recommended Dietary Goals [name] is currently in the action stage of change. As such, her goal is to continue weight management plan.  She has agreed to: continue current plan   Behavioral Intervention We discussed the following today: continue to work on maintaining a reduced calorie state, getting the recommended amount of protein, incorporating whole foods, making healthy choices, staying well hydrated and practicing mindfulness when eating.  Additional resources provided today: Handout on benefits of exercise and Handout on workouts using resistance bands  Evidence-based interventions for health behavior change were utilized today including the discussion of self monitoring techniques, problem-solving barriers and SMART goal setting techniques.   Regarding patient's less desirable eating habits and patterns, we employed the technique of small changes.   Pt will specifically work on: Eat red meat only 2 days a week for next visit.    Recommended Physical Activity Goals [name] has been advised to work up to 300-450 minutes of moderate intensity aerobic activity a week and strengthening  exercises 2-3 times per week for cardiovascular health, weight loss maintenance and preservation of muscle mass.   She has agreed to: Add 2 sets of 10-12 reps of weight lifting 5 days a week   Pharmacotherapy We both agreed to : continue with nutritional and behavioral strategies   FOR ASSOCIATED CONDITIONS ADDRESSED TODAY:  Prediabetes Assessment & Plan: Lab Results  Component Value Date   HGBA1C 5.5 08/09/2023   HGBA1C 5.6 02/08/2023   HGBA1C 5.7 08/08/2022   INSULIN  10.0 08/29/2021  [name] is not on any medication for this condition. Reviewed labs obtained by PCP, A1c is still continuing to decrease and is currently 5.5. Condition is well controlled. Continue to prioritize protein in diet, decrease carbs, and exercise. Will continue to monitor alongside PCP.    Vitamin D  deficiency Assessment & Plan: Lab Results  Component Value Date   VD25OH 56.93 08/09/2023  [name] is taking Cholecalciferol 2000 units daily. Per PCP labs, her Vit D levels are WNL. Continue current supplementation regimen. Will recheck regularly.    Hyponatremia Assessment & Plan: Lab Results  Component Value Date   CREATININE 0.79 08/09/2023   BUN 16 08/09/2023   NA 138 08/09/2023   K 4.7 08/09/2023   CL 105 08/09/2023   CO2 25 08/09/2023      Component Value Date/Time   PROT 6.5 08/09/2023 0901   PROT 7.3 08/29/2021 1127   ALBUMIN 4.3 08/09/2023 0901   ALBUMIN 5.0 (H) 08/29/2021 1127   AST 14 08/09/2023 0901   ALT 10 08/09/2023 0901   ALKPHOS 39 08/09/2023 0901   BILITOT 0.4 08/09/2023 0901   BILITOT 0.3 08/29/2021  1127   BILIDIR 0.1 11/17/2013 0915   IBILI 0.3 11/17/2013 0915  Pt received labs per PCP 3 weeks ago and asked to review them. She suffered in the past from low sodium levels which caused confusion and weakness. Labs show that levels greatly increased from low levels 2 years ago at 127, and are now 138. Kidney and liver enzymes WNL. Continue to practice water intake restrictions.  Will continue to monitor.     Mixed hyperlipidemia Assessment & Plan: Lab Results  Component Value Date   CHOL 251 (H) 08/09/2023   HDL 80.10 08/09/2023   LDLCALC 153 (H) 08/09/2023   LDLDIRECT 189.0 02/06/2021   TRIG 89.0 08/09/2023   CHOLHDL 3 08/09/2023  [name] takes fish oil to manage this condition. Per most recent labs, her LDL has worsened slightly from 150 to 153. HDL increased from 68.20 to 80.10. Pt prefers not to be on medication at this time to further manage condition. Encouraged pt to cut out red meat and increase chicken, [place], and fish in diet. Continue taking supplement at current dose. Will monitor condition closely as it pertains to her weight loss journey.   Follow up:   Return in about 8 weeks (around 10/30/2023). She was informed of the importance of frequent follow up visits to maximize her success with intensive lifestyle modifications for her multiple health conditions.  Subjective:   Chief complaint: Obesity [name] is here to discuss her progress with her obesity treatment plan. She is on the the Category 2 Plan and states she is following her eating plan approximately 50% of the time. She states she is doing chair exercises or walking 30 minutes 5 days per week.  Interval History:  [name] is here for a follow up office visit. Since last OV on 07/24/2023, pt is up 1 lb. She has been traveling and endorses eating out a few times during this time. Otherwise, pt is focusing on adhering to MP:   Pharmacotherapy for weight loss: She is currently taking no anti-obesity medication.   Review of Systems:  Pertinent positives were addressed with patient today.  Reviewed by clinician on day of visit: allergies, medications, problem list, medical history, surgical history, family history, social history, and previous encounter notes.  Weight Summary and Biometrics   Weight Lost Since Last Visit: 0lb  Weight Gained Since Last Visit: 1lb   Vitals Temp:  97.7 F (36.5 C) BP: (!) 140/72 (manual) Pulse Rate: 63 SpO2: 98 %   Anthropometric Measurements Height: 5\' 2"  (1.575 m) Weight: 132 lb (59.9 kg) BMI (Calculated): 24.14 Weight at Last Visit: 131lb Weight Lost Since Last Visit: 0lb Weight Gained Since Last Visit: 1lb Starting Weight: 170lb Total Weight Loss (lbs): 38 lb (17.2 kg) Peak Weight: 190lb   Body Composition  Body Fat %: 39 % Fat Mass (lbs): 51.4 lbs Muscle Mass (lbs): 76.4 lbs Total Body Water (lbs): 61.4 lbs Visceral Fat Rating : 11   Other Clinical Data Fasting: No Labs: No Today's Visit #: 20 Starting Date: 08/29/21    Objective:   PHYSICAL EXAM: Blood pressure (!) 140/72, pulse 63, temperature 97.7 F (36.5 C), height 5\' 2"  (1.575 m), weight 132 lb (59.9 kg), last menstrual period 05/14/1968, SpO2 98%. Body mass index is 24.14 kg/m.  General: she is overweight, cooperative and in no acute distress. PSYCH: Has normal mood, affect and thought process.   HEENT: EOMI, sclerae are anicteric. Lungs: Normal breathing effort, no conversational dyspnea. Extremities: Moves * 4 Neurologic: A  and O * 3, good insight  DIAGNOSTIC DATA REVIEWED: BMET    Component Value Date/Time   NA 138 08/09/2023 0901   NA 130 (L) 08/29/2021 1127   K 4.7 08/09/2023 0901   CL 105 08/09/2023 0901   CO2 25 08/09/2023 0901   GLUCOSE 98 08/09/2023 0901   BUN 16 08/09/2023 0901   BUN 14 08/29/2021 1127   CREATININE 0.79 08/09/2023 0901   CREATININE 0.72 02/01/2020 0834   CALCIUM  9.4 08/09/2023 0901   GFRNONAA 82 (L) 02/17/2014 0545   GFRNONAA 84 11/17/2013 0915   GFRAA >90 02/17/2014 0545   GFRAA >89 11/17/2013 0915   Lab Results  Component Value Date   HGBA1C 5.5 08/09/2023   HGBA1C 6.8 (H) 06/23/2009   Lab Results  Component Value Date   INSULIN  10.0 08/29/2021   Lab Results  Component Value Date   TSH 3.530 08/29/2021   CBC    Component Value Date/Time   WBC 12.3 (H) 09/05/2021 0927   RBC 4.35  09/05/2021 0927   HGB 13.0 09/05/2021 0927   HGB 13.5 08/29/2021 1127   HCT 38.9 09/05/2021 0927   HCT 39.8 08/29/2021 1127   PLT 384.0 09/05/2021 0927   PLT 530 (H) 08/29/2021 1127   MCV 89.6 09/05/2021 0927   MCV 88 08/29/2021 1127   MCH 29.7 08/29/2021 1127   MCH 29.3 02/17/2014 0545   MCHC 33.4 09/05/2021 0927   RDW 13.8 09/05/2021 0927   RDW 13.2 08/29/2021 1127   Iron Studies No results found for: "IRON", "TIBC", "FERRITIN", "IRONPCTSAT" Lipid Panel     Component Value Date/Time   CHOL 251 (H) 08/09/2023 0901   CHOL 273 (H) 08/29/2021 1127   TRIG 89.0 08/09/2023 0901   HDL 80.10 08/09/2023 0901   HDL 75 08/29/2021 1127   CHOLHDL 3 08/09/2023 0901   VLDL 17.8 08/09/2023 0901   LDLCALC 153 (H) 08/09/2023 0901   LDLCALC 168 (H) 08/29/2021 1127   LDLCALC 170 (H) 02/01/2020 0834   LDLDIRECT 189.0 02/06/2021 0909   Hepatic Function Panel     Component Value Date/Time   PROT 6.5 08/09/2023 0901   PROT 7.3 08/29/2021 1127   ALBUMIN 4.3 08/09/2023 0901   ALBUMIN 5.0 (H) 08/29/2021 1127   AST 14 08/09/2023 0901   ALT 10 08/09/2023 0901   ALKPHOS 39 08/09/2023 0901   BILITOT 0.4 08/09/2023 0901   BILITOT 0.3 08/29/2021 1127   BILIDIR 0.1 11/17/2013 0915   IBILI 0.3 11/17/2013 0915      Component Value Date/Time   TSH 3.530 08/29/2021 1127   Nutritional Lab Results  Component Value Date   VD25OH 56.93 08/09/2023   VD25OH 55.56 08/08/2022   VD25OH 90.14 04/09/2022    Attestations:   I, Camryn Mix, acting as a Stage manager for [employer], DO., have compiled all relevant documentation for today's office visit on behalf of Marceil Sensor, DO, while in the presence of [employer], DO.  I have reviewed the above documentation for accuracy and completeness, and I agree with the above. [name], D.O.  The 21st Century Cures Act was signed into law in 2016 which includes the topic of electronic health records.  This provides immediate access to  information in MyChart.  This includes consultation notes, operative notes, office notes, lab results and pathology reports.  If you have any questions about what you read please let us  know at your next visit so we can discuss your concerns and take corrective action if  need be.  We are right here with you.

## 2023-09-11 ENCOUNTER — Other Ambulatory Visit (HOSPITAL_BASED_OUTPATIENT_CLINIC_OR_DEPARTMENT_OTHER): Payer: Self-pay

## 2023-09-11 ENCOUNTER — Other Ambulatory Visit: Payer: Self-pay | Admitting: Family

## 2023-09-11 MED ORDER — EZETIMIBE 10 MG PO TABS
10.0000 mg | ORAL_TABLET | Freq: Every day | ORAL | 1 refills | Status: DC
  Filled 2023-09-11: qty 90, 90d supply, fill #0
  Filled 2023-12-11: qty 90, 90d supply, fill #1

## 2023-09-12 ENCOUNTER — Other Ambulatory Visit: Payer: Self-pay

## 2023-09-12 ENCOUNTER — Other Ambulatory Visit (HOSPITAL_BASED_OUTPATIENT_CLINIC_OR_DEPARTMENT_OTHER): Payer: Self-pay

## 2023-09-12 DIAGNOSIS — H26493 Other secondary cataract, bilateral: Secondary | ICD-10-CM | POA: Diagnosis not present

## 2023-09-12 DIAGNOSIS — H524 Presbyopia: Secondary | ICD-10-CM | POA: Diagnosis not present

## 2023-09-12 DIAGNOSIS — H5202 Hypermetropia, left eye: Secondary | ICD-10-CM | POA: Diagnosis not present

## 2023-09-12 DIAGNOSIS — H18513 Endothelial corneal dystrophy, bilateral: Secondary | ICD-10-CM | POA: Diagnosis not present

## 2023-09-12 DIAGNOSIS — H35371 Puckering of macula, right eye: Secondary | ICD-10-CM | POA: Diagnosis not present

## 2023-09-12 MED ORDER — SODIUM CHLORIDE (HYPERTONIC) 5 % OP SOLN
1.0000 [drp] | Freq: Two times a day (BID) | OPHTHALMIC | 6 refills | Status: AC
  Filled 2023-09-12: qty 15, 30d supply, fill #0
  Filled 2023-10-10: qty 15, 30d supply, fill #1
  Filled 2023-11-14: qty 15, 30d supply, fill #2
  Filled 2024-04-29: qty 15, 30d supply, fill #3

## 2023-09-13 ENCOUNTER — Other Ambulatory Visit (HOSPITAL_BASED_OUTPATIENT_CLINIC_OR_DEPARTMENT_OTHER): Payer: Self-pay

## 2023-09-27 ENCOUNTER — Other Ambulatory Visit (HOSPITAL_BASED_OUTPATIENT_CLINIC_OR_DEPARTMENT_OTHER): Payer: Self-pay

## 2023-10-10 ENCOUNTER — Other Ambulatory Visit: Payer: Self-pay | Admitting: Family

## 2023-10-10 ENCOUNTER — Other Ambulatory Visit (HOSPITAL_BASED_OUTPATIENT_CLINIC_OR_DEPARTMENT_OTHER): Payer: Self-pay

## 2023-10-10 MED ORDER — AMLODIPINE BESYLATE 10 MG PO TABS
10.0000 mg | ORAL_TABLET | Freq: Every day | ORAL | 1 refills | Status: DC
  Filled 2023-10-10: qty 90, 90d supply, fill #0
  Filled 2023-12-10 – 2024-01-07 (×2): qty 90, 90d supply, fill #1

## 2023-10-15 ENCOUNTER — Ambulatory Visit (INDEPENDENT_AMBULATORY_CARE_PROVIDER_SITE_OTHER)

## 2023-10-15 VITALS — Ht 62.0 in | Wt 132.0 lb

## 2023-10-15 DIAGNOSIS — Z0000 Encounter for general adult medical examination without abnormal findings: Secondary | ICD-10-CM | POA: Diagnosis not present

## 2023-10-15 NOTE — Progress Notes (Signed)
 Subjective:   [name] is a 83 y.o. who presents for a Medicare Wellness preventive visit.  As a reminder, Annual Wellness Visits don't include a physical exam, and some assessments may be limited, especially if this visit is performed virtually. We may recommend an in-person follow-up visit with your provider if needed.  Visit Complete: Virtual I connected with  Presleigh Feldstein Phung on 10/15/23 by a audio enabled telemedicine application and verified that I am speaking with the correct person using two identifiers.  Patient Location: Home  Provider Location: Home Office  I discussed the limitations of evaluation and management by telemedicine. The patient expressed understanding and agreed to proceed.  Vital Signs: Because this visit was a virtual/telehealth visit, some criteria may be missing or patient reported. Any vitals not documented were not able to be obtained and vitals that have been documented are patient reported.  VideoDeclined- This patient declined Librarian, academic. Therefore the visit was completed with audio only.  Persons Participating in Visit: Patient.  AWV Questionnaire: Yes: Patient Medicare AWV questionnaire was completed by the patient on 10/14/23; I have confirmed that all information answered by patient is correct and no changes since this date.  Cardiac Risk Factors include: advanced age (>86men, >83 women);dyslipidemia;hypertension     Objective:     Today's Vitals   10/15/23 1306  Weight: 132 lb (59.9 kg)  Height: 5\' 2"  (1.575 m)   Body mass index is 24.14 kg/m.     10/15/2023    1:09 PM 09/19/2022    3:41 PM 09/14/2021   10:27 AM 09/01/2020    8:15 AM 08/27/2019    2:24 PM 09/24/2016   10:20 AM 12/14/2014    9:36 AM  Advanced Directives  Does Patient Have a Medical Advance Directive? No Yes Yes Yes No Yes Yes  Type of Special educational needs teacher of [place];Living will Healthcare Power of [place];Out of  facility DNR (pink MOST or yellow form);Living will Healthcare Power of [place];Living will  Healthcare Power of [place];Living will Healthcare Power of Attorney  Does patient want to make changes to medical advance directive?      No - Patient declined No - Patient declined  Copy of Healthcare Power of Attorney in Chart?  No - copy requested No - copy requested No - copy requested  No - copy requested No - copy requested  Would patient like information on creating a medical advance directive? Yes (MAU/Ambulatory/Procedural Areas - Information given)    No - Patient declined      Current Medications (verified) Outpatient Encounter Medications as of 10/15/2023  Medication Sig   amLODipine  (NORVASC ) 10 MG tablet Take 1 tablet (10 mg total) by mouth daily.   Ascorbic Acid (VITAMIN C) 1000 MG tablet Take 1,000 mg by mouth daily.   aspirin  (ASPIRIN  81) 81 MG EC tablet Take 1 tablet (81 mg total) by mouth daily. Swallow whole.   Calcium  Carb-Cholecalciferol (CALCIUM  600+D) 600-10 MG-MCG TABS Take 600 mg by mouth daily.   carboxymethylcellulose (REFRESH PLUS) 0.5 % SOLN Place 1 drop into both eyes 2 (two) times daily.   Cholecalciferol (VITAMIN D3) 50 MCG (2000 UT) capsule Take 1 capsule (2,000 Units total) by mouth daily.   Coenzyme Q10 200 MG capsule Take 200 mg by mouth daily.   denosumab  (PROLIA ) 60 MG/ML SOSY injection Inject 60 mg into the skin every 6 (six) months. Dx code: M81.0.  pt has appt 08/15/23.   ezetimibe  (ZETIA ) 10 MG  tablet Take 1 tablet (10 mg total) by mouth daily.   fluticasone  (FLONASE ) 50 MCG/ACT nasal spray 1 spray each nostril following sinus rinses twice daily   hydrALAZINE  (APRESOLINE ) 25 MG tablet Take 1 tablet (25 mg total) by mouth 3 (three) times daily.   ibuprofen (ADVIL,MOTRIN) 200 MG tablet Take 400 mg by mouth daily.   Magnesium 400 MG TABS Take 400 mg by mouth daily.   metoprolol  succinate (TOPROL -XL) 50 MG 24 hr tablet Take 1.5 tablets (75 mg total) by mouth  daily.Take with or immediately following a meal.   Multiple Vitamin (MULTIVITAMIN) tablet Take 1 tablet by mouth daily.   niacin  500 MG tablet Take 500 mg by mouth 2 (two) times daily with a meal.   Omega-3 Fatty Acids (FISH OIL) 1000 MG CAPS Take 1 capsule by mouth every morning. (Krill Oil)   omeprazole  (PRILOSEC) 40 MG capsule Take 1 capsule (40 mg total) by mouth daily.   sodium chloride  (MURO 128) 5 % ophthalmic solution Place 1 drop into both eyes 2 (two) to 3 (three) times daily.   Facility-Administered Encounter Medications as of 10/15/2023  Medication   denosumab  (PROLIA ) injection 60 mg    Allergies (verified) Atorvastatin, Diclofenac-misoprostol, Fenofibrate, Rosuvastatin, Statins, Amoxicillin-pot clavulanate, Penicillins, Sulfonamide derivatives, and Tuberculin ppd   History: Past Medical History:  Diagnosis Date   Allergy    Arthritis    osteoarthritis   Back pain    Barrett's esophagus    Cataract    bilateral cateracts removed   Chronic kidney disease    kidney stones   Chronic obstructive bronchitis (HCC)    Dr Brent Cambric   Colitis    Colon polyps    GERD (gastroesophageal reflux disease)    History of chicken pox    History of nephrolithiasis    History of transfusion of packed red blood cells    Hypercholesteremia    Hyperlipidemia    Hypertension    IFG (impaired fasting glucose)    Osteoarthritis    Osteoporosis    pt cannot tolerate fosamax   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   Swelling of both lower extremities    Urinary incontinence    Past Surgical History:  Procedure Laterality Date   ABDOMINAL HYSTERECTOMY  1970   APPENDECTOMY     CHOLECYSTECTOMY     EYE SURGERY  2010   FRACTURE SURGERY  2007   JOINT REPLACEMENT  2007   LEG SURGERY  2008   right   TOTAL KNEE ARTHROPLASTY  04/2006   total left knee replacement   Family History  Problem Relation Age of Onset   Coronary artery disease  Mother    Arthritis Mother    High blood pressure Mother    Heart disease Mother    High blood pressure Father    Heart disease Father    Asthma Maternal Grandmother    Tuberculosis Other    Colon cancer Neg Hx    Esophageal cancer Neg Hx    Rectal cancer Neg Hx    Stomach cancer Neg Hx    Social History   Socioeconomic History   Marital status: Widowed    Spouse name: Not on file   Number of children: Not on file   Years of education: Not on file   Highest education level: 10th grade  Occupational History   Occupation: Retired  Tobacco Use   Smoking status: Former    Current packs/day:  0.00    Types: Cigarettes    Start date: 05/14/1952    Quit date: 05/15/1967    Years since quitting: 56.4   Smokeless tobacco: Never  Substance and Sexual Activity   Alcohol use: No   Drug use: No   Sexual activity: Never  Other Topics Concern   Not on file  Social History Narrative   Regular exercise: yes   Social Drivers of Health   Financial Resource Strain: Low Risk  (10/15/2023)   Overall Financial Resource Strain (CARDIA)    Difficulty of Paying Living Expenses: Not hard at all  Food Insecurity: No Food Insecurity (10/15/2023)   Hunger Vital Sign    Worried About Running Out of Food in the Last Year: Never true    Ran Out of Food in the Last Year: Never true  Transportation Needs: No Transportation Needs (10/15/2023)   PRAPARE - Administrator, Civil Service (Medical): No    Lack of Transportation (Non-Medical): No  Physical Activity: Insufficiently Active (10/15/2023)   Exercise Vital Sign    Days of Exercise per Week: 3 days    Minutes of Exercise per Session: 30 min  Stress: No Stress Concern Present (10/15/2023)   [employer] of Occupational Health - Occupational Stress Questionnaire    Feeling of Stress : Not at all  Social Connections: Moderately Integrated (10/15/2023)   Social Connection and Isolation Panel [NHANES]    Frequency of Communication with  Friends and Family: More than three times a week    Frequency of Social Gatherings with Friends and Family: More than three times a week    Attends Religious Services: More than 4 times per year    Active Member of [employer] or Organizations: Yes    Attends Banker Meetings: More than 4 times per year    Marital Status: Widowed    Tobacco Counseling Counseling given: Not Answered    Clinical Intake:  Pre-visit preparation completed: Yes  Pain : No/denies pain  Diabetes: No  Lab Results  Component Value Date   HGBA1C 5.5 08/09/2023   HGBA1C 5.6 02/08/2023   HGBA1C 5.7 08/08/2022     How often do you need to have someone help you when you read instructions, pamphlets, or other written materials from your doctor or pharmacy?: 1 - Never  Interpreter Needed?: No  Information entered by :: Seabron Cypress LPN   Activities of Daily Living     10/14/2023   11:31 AM  In your present state of health, do you have any difficulty performing the following activities:  Hearing? 1  Vision? 0  Difficulty concentrating or making decisions? 0  Walking or climbing stairs? 0  Dressing or bathing? 0  Doing errands, shopping? 0  Preparing Food and eating ? N  Using the Toilet? N  In the past six months, have you accidently leaked urine? Y  Do you have problems with loss of bowel control? N  Managing your Medications? N  Managing your Finances? N  Housekeeping or managing your Housekeeping? N    Patient Care Team: Dorrene Gaucher, NP as PCP - General Pietro Bridegroom, MD (Inactive) as Consulting Physician (Gastroenterology) Adelaide Adjutant, MD as Consulting Physician (Physical Medicine and Rehabilitation) Cecilie Coffee, RPH-CPP (Pharmacist) [facility], [place]  I have updated your Care Teams any recent Medical Services you may have received from other providers in the past year.     Assessment:    This is a routine wellness examination  for  [employer]  Hearing/Vision screen Hearing Screening - Comments:: Denies hearing difficulties   Vision Screening - Comments:: Wears rx glasses - up to date with routine eye exams with Dr. Brooks Cao     Goals Addressed             This Visit's Progress    Increase physical activity   On track    Patient is exercising regularly at least 5 days per week for 20 minutes.        Depression Screen     10/15/2023    1:08 PM 02/08/2023    8:30 AM 09/19/2022    3:43 PM 08/08/2022    9:54 AM 09/14/2021   10:33 AM 08/29/2021    9:53 AM 02/06/2021    9:25 AM  PHQ 2/9 Scores  PHQ - 2 Score 0 0 0 0 0 3 0  PHQ- 9 Score  0  0 0 12     Fall Risk     10/14/2023   11:31 AM 02/08/2023    8:29 AM 09/12/2022    2:02 PM 08/08/2022    9:55 AM 09/14/2021   10:31 AM  Fall Risk   Falls in the past year? 1 1 0 0 1  Number falls in past yr: 1 1 0 0 0  Injury with Fall? 0 1 0 0 0  Risk for fall due to : History of fall(s) No Fall Risks No Fall Risks No Fall Risks History of fall(s)  Follow up Falls evaluation completed;Education provided;Falls prevention discussed Falls evaluation completed Falls evaluation completed Falls evaluation completed Falls evaluation completed    MEDICARE RISK AT HOME:  Medicare Risk at Home Any stairs in or around the home?: (Patient-Rptd) No If so, are there any without handrails?: (Patient-Rptd) No Home free of loose throw rugs in walkways, pet beds, electrical cords, etc?: (Patient-Rptd) Yes Adequate lighting in your home to reduce risk of falls?: (Patient-Rptd) Yes Life alert?: (Patient-Rptd) Yes Use of a cane, walker or w/c?: (Patient-Rptd) No Grab bars in the bathroom?: (Patient-Rptd) Yes Shower chair or bench in shower?: (Patient-Rptd) Yes Elevated toilet seat or a handicapped toilet?: (Patient-Rptd) Yes  TIMED UP AND GO:  Was the test performed?  No  Cognitive Function: 6CIT completed    09/24/2016   10:22 AM  MMSE - Mini Mental State Exam  Orientation to time 5   Orientation to Place 5  Registration 3  Attention/ Calculation 5  Recall 2  Language- name 2 objects 2  Language- repeat 1  Language- follow 3 step command 3  Language- read & follow direction 1  Write a sentence 1  Copy design 1  Total score 29        10/15/2023    1:09 PM 09/19/2022    3:52 PM 09/14/2021   10:39 AM 09/01/2020    8:24 AM  6CIT Screen  What Year? 0 points 0 points 0 points 0 points  What month? 0 points 0 points 0 points 0 points  What time? 0 points 0 points 0 points 0 points  Count back from 20 0 points 0 points 0 points 0 points  Months in reverse 0 points 0 points 2 points 0 points  Repeat phrase 0 points 0 points 0 points 0 points  Total Score 0 points 0 points 2 points 0 points    Immunizations Immunization History  Administered Date(s) Administered   Fluad Quad(high Dose 65+) 02/25/2019, 02/01/2020, 02/06/2021, 03/07/2022   Fluad Trivalent(High Dose 65+) 02/08/2023  Influenza Split 04/18/2011, 02/08/2012   Influenza Whole 04/01/2007, 02/02/2008, 01/31/2009, 02/20/2010   Influenza, High Dose Seasonal PF 04/01/2018   Influenza,inj,Quad PF,6+ Mos 02/11/2014, 01/28/2015, 03/27/2016   Influenza-Unspecified 03/14/2013, 03/05/2017   PFIZER(Purple Top)SARS-COV-2 Vaccination 06/03/2019, 06/24/2019, 03/03/2020, 09/29/2020   Pfizer Covid-19 Vaccine Bivalent Booster 64yrs & up 02/13/2021   Pfizer(Comirnaty )Fall Seasonal Vaccine 12 years and older 03/07/2022   Pneumococcal Conjugate-13 03/23/2015   Pneumococcal Polysaccharide-23 07/24/2006, 02/25/2019   Respiratory Syncytial Virus Vaccine ,Recomb Aduvanted(Arexvy ) 04/09/2022   Td 03/19/2002, 05/14/2006   Tdap 07/12/2020   Zoster Recombinant(Shingrix) 04/02/2017, 06/02/2017    Screening Tests Health Maintenance  Topic Date Due   COVID-19 Vaccine (7 - 2024-25 season) 01/13/2023   INFLUENZA VACCINE  12/13/2023   HEMOGLOBIN A1C  02/09/2024   Diabetic kidney evaluation - eGFR measurement  08/08/2024    Diabetic kidney evaluation - Urine ACR  08/08/2024   Medicare Annual Wellness (AWV)  10/14/2024   DTaP/Tdap/Td (4 - Td or Tdap) 07/13/2030   Pneumonia Vaccine 43+ Years old  Completed   DEXA SCAN  Completed   Zoster Vaccines- Shingrix  Completed   HPV VACCINES  Aged Out   Meningococcal B Vaccine  Aged Out   FOOT EXAM  Discontinued   OPHTHALMOLOGY EXAM  Discontinued    Health Maintenance  Health Maintenance Due  Topic Date Due   COVID-19 Vaccine (7 - 2024-25 season) 01/13/2023    Additional Screening:  Vision Screening: Recommended annual ophthalmology exams for early detection of glaucoma and other disorders of the eye. Would you like a referral to an eye doctor? No    Dental Screening: Recommended annual dental exams for proper oral hygiene  Community Resource Referral / Chronic Care Management: CRR required this visit?  No   CCM required this visit?  No   Plan:    I have personally reviewed and noted the following in the patient's chart:   Medical and social history Use of alcohol, tobacco or illicit drugs  Current medications and supplements including opioid prescriptions. Patient is not currently taking opioid prescriptions. Functional ability and status Nutritional status Physical activity Advanced directives List of other physicians Hospitalizations, surgeries, and ER visits in previous 12 months Vitals Screenings to include cognitive, depression, and falls Referrals and appointments  In addition, I have reviewed and discussed with patient certain preventive protocols, quality metrics, and best practice recommendations. A written personalized care plan for preventive services as well as general preventive health recommendations were provided to patient.   Seabron Cypress [place], [place]   05/19/1094   After Visit Summary: (MyChart) Due to this being a telephonic visit, the after visit summary with patients personalized plan was offered to patient via MyChart    Notes: Nothing significant to report at this time.

## 2023-10-15 NOTE — Patient Instructions (Signed)
 [name] , Thank you for taking time out of your busy schedule to complete your Annual Wellness Visit with me. I enjoyed our conversation and look forward to speaking with you again next year. I, as well as your care team,  appreciate your ongoing commitment to your health goals. Please review the following plan we discussed and let me know if I can assist you in the future. Your Game plan/ To Do List    Follow up Visits: Next Medicare AWV with our clinical staff: In 1 year   Have you seen your provider in the last 6 months (3 months if uncontrolled diabetes)? No Next Office Visit with your provider: 02/11/24 @ 10:00  Clinician Recommendations:  Aim for 30 minutes of exercise or brisk walking, 6-8 glasses of water, and 5 servings of fruits and vegetables each day.       This is a list of the screening recommended for you and due dates:  Health Maintenance  Topic Date Due   COVID-19 Vaccine (7 - 2024-25 season) 01/13/2023   Flu Shot  12/13/2023   Hemoglobin A1C  02/09/2024   Yearly kidney function blood test for diabetes  08/08/2024   Yearly kidney health urinalysis for diabetes  08/08/2024   Medicare Annual Wellness Visit  10/14/2024   DTaP/Tdap/Td vaccine (4 - Td or Tdap) 07/13/2030   Pneumonia Vaccine  Completed   DEXA scan (bone density measurement)  Completed   Zoster (Shingles) Vaccine  Completed   HPV Vaccine  Aged Out   Meningitis B Vaccine  Aged Out   Complete foot exam   Discontinued   Eye exam for diabetics  Discontinued    Advanced directives: (ACP Link)Information on Advanced Care Planning can be found at [place]  Secretary of [facility] Advance Health Care Directives Advance Health Care Directives. [email or web address]   Advance Care Planning is important because it:  [x]  Makes sure you receive the medical care that is consistent with your values, goals, and preferences  [x]  It provides guidance to your family and loved ones and reduces their decisional burden about whether  or not they are making the right decisions based on your wishes.  Follow the link provided in your after visit summary or read over the paperwork we have mailed to you to help you started getting your Advance Directives in place. If you need assistance in completing these, please reach out to us  so that we can help you!  See attachments for Preventive Care and Fall Prevention Tips.

## 2023-10-30 ENCOUNTER — Encounter (INDEPENDENT_AMBULATORY_CARE_PROVIDER_SITE_OTHER): Payer: Self-pay | Admitting: Family Medicine

## 2023-10-30 ENCOUNTER — Ambulatory Visit (INDEPENDENT_AMBULATORY_CARE_PROVIDER_SITE_OTHER): Admitting: Family Medicine

## 2023-10-30 VITALS — BP 146/65 | HR 69 | Temp 97.9°F | Ht 62.0 in | Wt 133.0 lb

## 2023-10-30 DIAGNOSIS — I10 Essential (primary) hypertension: Secondary | ICD-10-CM

## 2023-10-30 DIAGNOSIS — Z6824 Body mass index (BMI) 24.0-24.9, adult: Secondary | ICD-10-CM | POA: Diagnosis not present

## 2023-10-30 DIAGNOSIS — E782 Mixed hyperlipidemia: Secondary | ICD-10-CM

## 2023-10-30 DIAGNOSIS — E669 Obesity, unspecified: Secondary | ICD-10-CM | POA: Diagnosis not present

## 2023-10-30 DIAGNOSIS — E66811 Obesity, class 1: Secondary | ICD-10-CM

## 2023-10-30 NOTE — Progress Notes (Signed)
 [name], D.O.  ABFM, ABOM Specializing in Clinical Bariatric Medicine  Office located at: [address] [place], [place]  [address]   Assessment and Plan:  No orders of the defined types were placed in this encounter.   There are no discontinued medications.   No orders of the defined types were placed in this encounter.     FOR THE DISEASE OF OBESITY: BMI 24.0-24.9, adult -- Current BMI 24.32 Obesity (BMI 30.0-34.9) - starting BMI 33.2 Assessment & Plan: Since last office visit on 09/04/23 patient's muscle mass has decreased by 2.4 lbs. Fat mass has increased by 4 lbs. Total body water has decreased by 3.6 lbs. Counseling done on how various foods will affect these numbers and how to maximize success  Total lbs lost to date: *** Total weight loss percentage to date: ***    Recommended Dietary Goals [name] is currently in the action stage of change. As such, her goal is to continue weight management plan.  She has agreed to: {EMWTLOSSPLAN:29297::continue current plan}   Behavioral Intervention We discussed the following today: {dowtlossstrategies:31654}  Additional resources provided today: {DOhandouts:31655::None}  Evidence-based interventions for health behavior change were utilized today including the discussion of self monitoring techniques, problem-solving barriers and SMART goal setting techniques.   Regarding patient's less desirable eating habits and patterns, we employed the technique of small changes.   Pt will specifically work on: *** for next visit.    Recommended Physical Activity Goals [name] has been advised to work up to 300-450 minutes of moderate intensity aerobic activity a week and strengthening exercises 2-3 times per week for cardiovascular health, weight loss maintenance and preservation of muscle mass.   She has agreed to :  {EMEXERCISE:28847::Think about enjoyable ways to increase daily physical activity and overcoming  barriers to exercise,Increase physical activity in their day and reduce sedentary time (increase NEAT).}   Pharmacotherapy We both agreed to : {EMagreedrx:29170}   ASSOCIATED CONDITIONS ADDRESSED TODAY:  There are no diagnoses linked to this encounter.    Follow up:   No follow-ups on file.*** She was informed of the importance of frequent follow up visits to maximize her success with intensive lifestyle modifications for her multiple health conditions.  Subjective:   Chief complaint: Obesity [name] is here to discuss her progress with her obesity treatment plan. She is on the Category 2 Plan and states she is following her eating plan approximately 50% of the time. She states she is doing chair exercises (Lazy Fit) for 30 minutes 4 days per week.  Interval History:  [name] is here for a follow up office visit. Since last OV on 4/23 she gained 1 lb.    She reports going on vacation recently and was not eating well or exercising at the time.       Pharmacotherapy for weight loss: She is currently taking {EMPharmaco:28845}.   Review of Systems:  Pertinent positives were addressed with patient today.  Reviewed by clinician on day of visit: allergies, medications, problem list, medical history, surgical history, family history, social history, and previous encounter notes.  Weight Summary and Biometrics   Weight Lost Since Last Visit: 0lb  Weight Gained Since Last Visit: 1lb  ***  Vitals Temp: 97.9 F (36.6 C) BP: (!) 146/65 Pulse Rate: 69 SpO2: 97 %   Anthropometric Measurements Height: 5' 2 (1.575 m) Weight: 133 lb (60.3 kg) BMI (Calculated): 24.32 Weight at Last Visit: 132lb Weight Lost Since Last Visit: 0lb Weight  Gained Since Last Visit: 1lb Starting Weight: 170lb Total Weight Loss (lbs): 37 lb (16.8 kg) Peak Weight: 190lb   Body Composition  Body Fat %: 41.5 % Fat Mass (lbs): 55.4 lbs Muscle Mass (lbs): 74 lbs Total Body Water  (lbs): 57.8 lbs Visceral Fat Rating : 12   Other Clinical Data Fasting: No Labs: no Today's Visit #: 21 Starting Date: 08/29/21    Objective:   PHYSICAL EXAM: Blood pressure (!) 146/65, pulse 69, temperature 97.9 F (36.6 C), height 5' 2 (1.575 m), weight 133 lb (60.3 kg), last menstrual period 05/14/1968, SpO2 97%. Body mass index is 24.33 kg/m.  General: she is overweight, cooperative and in no acute distress. PSYCH: Has normal mood, affect and thought process.   HEENT: EOMI, sclerae are anicteric. Lungs: Normal breathing effort, no conversational dyspnea. Extremities: Moves * 4 Neurologic: A and O * 3, good insight  DIAGNOSTIC DATA REVIEWED: BMET    Component Value Date/Time   NA 138 08/09/2023 0901   NA 130 (L) 08/29/2021 1127   K 4.7 08/09/2023 0901   CL 105 08/09/2023 0901   CO2 25 08/09/2023 0901   GLUCOSE 98 08/09/2023 0901   BUN 16 08/09/2023 0901   BUN 14 08/29/2021 1127   CREATININE 0.79 08/09/2023 0901   CREATININE 0.72 02/01/2020 0834   CALCIUM  9.4 08/09/2023 0901   GFRNONAA 82 (L) 02/17/2014 0545   GFRNONAA 84 11/17/2013 0915   GFRAA >90 02/17/2014 0545   GFRAA >89 11/17/2013 0915   Lab Results  Component Value Date   HGBA1C 5.5 08/09/2023   HGBA1C 6.8 (H) 06/23/2009   Lab Results  Component Value Date   INSULIN  10.0 08/29/2021   Lab Results  Component Value Date   TSH 3.530 08/29/2021   CBC    Component Value Date/Time   WBC 12.3 (H) 09/05/2021 0927   RBC 4.35 09/05/2021 0927   HGB 13.0 09/05/2021 0927   HGB 13.5 08/29/2021 1127   HCT 38.9 09/05/2021 0927   HCT 39.8 08/29/2021 1127   PLT 384.0 09/05/2021 0927   PLT 530 (H) 08/29/2021 1127   MCV 89.6 09/05/2021 0927   MCV 88 08/29/2021 1127   MCH 29.7 08/29/2021 1127   MCH 29.3 02/17/2014 0545   MCHC 33.4 09/05/2021 0927   RDW 13.8 09/05/2021 0927   RDW 13.2 08/29/2021 1127   Iron Studies No results found for: IRON, TIBC, FERRITIN, IRONPCTSAT Lipid Panel      Component Value Date/Time   CHOL 251 (H) 08/09/2023 0901   CHOL 273 (H) 08/29/2021 1127   TRIG 89.0 08/09/2023 0901   HDL 80.10 08/09/2023 0901   HDL 75 08/29/2021 1127   CHOLHDL 3 08/09/2023 0901   VLDL 17.8 08/09/2023 0901   LDLCALC 153 (H) 08/09/2023 0901   LDLCALC 168 (H) 08/29/2021 1127   LDLCALC 170 (H) 02/01/2020 0834   LDLDIRECT 189.0 02/06/2021 0909   Hepatic Function Panel     Component Value Date/Time   PROT 6.5 08/09/2023 0901   PROT 7.3 08/29/2021 1127   ALBUMIN 4.3 08/09/2023 0901   ALBUMIN 5.0 (H) 08/29/2021 1127   AST 14 08/09/2023 0901   ALT 10 08/09/2023 0901   ALKPHOS 39 08/09/2023 0901   BILITOT 0.4 08/09/2023 0901   BILITOT 0.3 08/29/2021 1127   BILIDIR 0.1 11/17/2013 0915   IBILI 0.3 11/17/2013 0915      Component Value Date/Time   TSH 3.530 08/29/2021 1127   Nutritional Lab Results  Component Value Date   VD25OH  56.93 08/09/2023   VD25OH 55.56 08/08/2022   VD25OH 90.14 04/09/2022    Attestations:   I, Vernell Forest, acting as a medical scribe for Miranda Jenkins, DO., have compiled all relevant documentation for today's office visit on behalf of Miranda Jenkins, DO, while in the presence of [employer], DO.  Reviewed by clinician on day of visit: allergies, medications, problem list, medical history, surgical history, family history, social history, and previous encounter notes pertinent to patient's obesity diagnosis.  I have reviewed the above documentation for accuracy and completeness, and I agree with the above. Miranda JINNY [name], D.O.  The 21st Century Cures Act was signed into law in 2016 which includes the topic of electronic health records.  This provides immediate access to information in MyChart.  This includes consultation notes, operative notes, office notes, lab results and pathology reports.  If you have any questions about what you read please let us  know at your next visit so we can discuss your concerns and take corrective  action if need be.  We are right here with you.

## 2023-11-14 ENCOUNTER — Other Ambulatory Visit (HOSPITAL_BASED_OUTPATIENT_CLINIC_OR_DEPARTMENT_OTHER): Payer: Self-pay

## 2023-11-14 ENCOUNTER — Other Ambulatory Visit: Payer: Self-pay

## 2023-12-10 ENCOUNTER — Other Ambulatory Visit (HOSPITAL_BASED_OUTPATIENT_CLINIC_OR_DEPARTMENT_OTHER): Payer: Self-pay

## 2023-12-27 ENCOUNTER — Other Ambulatory Visit: Payer: Self-pay | Admitting: Family

## 2023-12-30 ENCOUNTER — Other Ambulatory Visit (HOSPITAL_BASED_OUTPATIENT_CLINIC_OR_DEPARTMENT_OTHER): Payer: Self-pay

## 2023-12-30 MED ORDER — METOPROLOL SUCCINATE ER 50 MG PO TB24
75.0000 mg | ORAL_TABLET | Freq: Every day | ORAL | 1 refills | Status: DC
  Filled 2023-12-30: qty 135, 90d supply, fill #0
  Filled 2024-03-23: qty 135, 90d supply, fill #1

## 2024-01-15 ENCOUNTER — Ambulatory Visit (INDEPENDENT_AMBULATORY_CARE_PROVIDER_SITE_OTHER): Admitting: Family Medicine

## 2024-01-16 ENCOUNTER — Telehealth: Payer: Self-pay

## 2024-01-16 ENCOUNTER — Other Ambulatory Visit (HOSPITAL_COMMUNITY): Payer: Self-pay

## 2024-01-16 NOTE — Telephone Encounter (Signed)
 Prolia  VOB initiated via [email or web address]  Next Prolia  inj DUE: 02/15/24

## 2024-01-21 ENCOUNTER — Other Ambulatory Visit (HOSPITAL_COMMUNITY): Payer: Self-pay

## 2024-01-21 NOTE — Telephone Encounter (Signed)
 Prolia  is no longer preferred for pharmacy benefit. Jubbonti is now preferred. Ran test claim, patient's copay will be $250 for Jubbonti. We are waiting on insurance formularies to update for buy & bill.

## 2024-01-22 ENCOUNTER — Ambulatory Visit (INDEPENDENT_AMBULATORY_CARE_PROVIDER_SITE_OTHER): Admitting: Family Medicine

## 2024-01-27 ENCOUNTER — Ambulatory Visit (INDEPENDENT_AMBULATORY_CARE_PROVIDER_SITE_OTHER): Admitting: Family Medicine

## 2024-01-27 ENCOUNTER — Encounter (INDEPENDENT_AMBULATORY_CARE_PROVIDER_SITE_OTHER): Payer: Self-pay | Admitting: Family Medicine

## 2024-01-27 VITALS — HR 64 | Temp 98.3°F | Ht 62.0 in | Wt 132.0 lb

## 2024-01-27 DIAGNOSIS — E66811 Obesity, class 1: Secondary | ICD-10-CM

## 2024-01-27 DIAGNOSIS — E782 Mixed hyperlipidemia: Secondary | ICD-10-CM

## 2024-01-27 DIAGNOSIS — Z6824 Body mass index (BMI) 24.0-24.9, adult: Secondary | ICD-10-CM

## 2024-01-27 DIAGNOSIS — I10 Essential (primary) hypertension: Secondary | ICD-10-CM

## 2024-01-27 DIAGNOSIS — E669 Obesity, unspecified: Secondary | ICD-10-CM | POA: Diagnosis not present

## 2024-01-27 DIAGNOSIS — R7303 Prediabetes: Secondary | ICD-10-CM | POA: Diagnosis not present

## 2024-01-27 NOTE — Progress Notes (Signed)
 [name], D.O.  ABFM, ABOM Specializing in Clinical Bariatric Medicine  Office located at: [address] [place], [place]  [address]   Assessment and Plan:   FOR THE DISEASE OF OBESITY:  BMI 24.0-24.9, adult -- Current BMI 24.14 Obesity (BMI 30.0-34.9) - starting BMI 33.2 Assessment & Plan: Since last office visit on 10/30/2023 patient's muscle mass has increased by 2.2 lbs. Fat mass has decreased by 3.4 lbs. Total body water has increased by 1.6 lbs.  Body fat % has decreased by 2.3  %. Counseling done on how various foods will affect these numbers and how to maximize success  Total lbs lost to date: -38 lbs Total weight loss percentage to date: - 22.35 %   Recommended Dietary Goals [name] is currently in the action stage of change. As such, her goal is to continue weight management plan.  She has agreed to: continue current plan   Behavioral Intervention We discussed the following today: eating whole foods, healthy high protein snack ideas, increasing lean protein intake to established goals and increasing vegetables  Additional resources provided today: none  Evidence-based interventions for health behavior change were utilized today including the discussion of self monitoring techniques, problem-solving barriers and SMART goal setting techniques.   Regarding patient's less desirable eating habits and patterns, we employed the technique of small changes.   Goal(s) for next OV:  n/a   Recommended Physical Activity Goals [name] has been advised to work up to 300-450 minutes of moderate intensity aerobic activity a week and strengthening exercises 2-3 times per week for cardiovascular health, weight loss maintenance and preservation of muscle mass.   She was encouraged to walk 10,000 steps daily (currently averages 5,000 per her apple watch) and work on improving her balance and flexibility.   Pharmacotherapy Continue with current nutritional and behavioral  strategies   ASSOCIATED CONDITIONS ADDRESSED TODAY:   Essential hypertension Assessment & Plan: Her BP is at goal today: 130/55. On Norvasc  10 mg daily, Hydralazine  25 mg TID, and Toprol -XL 75 mg daily with reported good compliance and tolerance. Pt endorses sometimes experiencing dizziness. Discussed that Toprol  can contribute to this symptom as well as weight gain. Cont all medications; recommend she discuss with her primary care team about potentially adjusting her Toprol  dose when she meets with them at the end of the month. Cont low sodium heart healthy meal plan.     Prediabetes Assessment & Plan: Lab Results  Component Value Date   HGBA1C 5.5 08/09/2023   HGBA1C 5.6 02/08/2023   HGBA1C 5.7 08/08/2022   INSULIN  10.0 08/29/2021    Pre-DM managed with dietary and life style interventions. Reviewed labs with pt. Hemoglobin A1c at goal. Pt's hunger and carb cravings are well controlled; she has been intentional about decreasing artifical sugars in her diet.Continue working on nutrition plan to decrease simple carbohydrates and increase lean proteins. Advance exercise as able (see exercise goal above)    Mixed hyperlipidemia Assessment & Plan: Lab Results  Component Value Date   CHOL 251 (H) 08/09/2023   HDL 80.10 08/09/2023   LDLCALC 153 (H) 08/09/2023   LDLDIRECT 189.0 02/06/2021   TRIG 89.0 08/09/2023   CHOLHDL 3 08/09/2023   Doing well on Fish oil and Zetia  10 mg daily. LDL is not at goal. Elevated LDL may be secondary to nutrition, genetics and spillover effect from excess adiposity. Recommended LDL goal is <100. Continue medications and diet low in saturated fats and trans fats. Advised pt to discuss  with PCP about potentially getting a coronary CT scan.    Follow up:   Return 03/30/2024 at 12:20 PM.  She was informed of the importance of frequent follow up visits to maximize her success with intensive lifestyle modifications for her multiple health  conditions.   Subjective:    Chief complaint: Obesity [name] is here to discuss her progress with her obesity treatment plan. She is on the Category 2 Plan and states she is following her eating plan approximately 50% of the time. Pt is walking and doing resistance chair exercises 15-30 minutes 7 days per week   Interval History:  [name] is here for a follow up office visit. Pt has experienced a weight loss of 1 lb since last OV on 10/30/2023. She recently returned from a pleasant trip to [facility] with her family. During the trip, she did eat some off-plan foods but increased NEAT and hydrated well.    Her overall lifestyle/dietary habits:  - Tracking Calories/Macros: yes  - Eating More Whole Foods: yes  - Adequate Protein Intake: yes  - Adequate Water Intake: yes  - Skipping Meals: sometime skips meals when busy  - Sleeping 7-9 Hours/ Night: no    Pharmacotherapy that aid with weight loss: none   Review of Systems:  Pertinent positives were addressed with patient today.  Reviewed by clinician on day of visit: allergies, medications, problem list, medical history, surgical history, family history, social history, and previous encounter notes.  Weight Summary and Biometrics   Weight Lost Since Last Visit: 1lb  Weight Gained Since Last Visit: 0   Vitals Temp: 98.3 F (36.8 C) Pulse Rate: 64 SpO2: 96 %   Anthropometric Measurements Height: 5' 2 (1.575 m) Weight: 132 lb (59.9 kg) BMI (Calculated): 24.14 Weight at Last Visit: 133lb Weight Lost Since Last Visit: 1lb Weight Gained Since Last Visit: 0 Starting Weight: 170lb Total Weight Loss (lbs): 38 lb (17.2 kg)   Body Composition  Body Fat %: 39.2 % Fat Mass (lbs): 52 lbs Muscle Mass (lbs): 76.2 lbs Total Body Water (lbs): 59.4 lbs Visceral Fat Rating : 11   Other Clinical Data Fasting: no Labs: no Today's Visit #: 22 Starting Date: 08/26/21    Objective:   PHYSICAL EXAM: Pulse  64, temperature 98.3 F (36.8 C), height 5' 2 (1.575 m), weight 132 lb (59.9 kg), last menstrual period 05/14/1968, SpO2 96%. Body mass index is 24.14 kg/m.  General: she is overweight, cooperative and in no acute distress. PSYCH: Has normal mood, affect and thought process.   HEENT: EOMI, sclerae are anicteric. Lungs: Normal breathing effort, no conversational dyspnea. Extremities: Moves * 4 Neurologic: A and O * 3, good insight  DIAGNOSTIC DATA REVIEWED: BMET    Component Value Date/Time   NA 138 08/09/2023 0901   NA 130 (L) 08/29/2021 1127   K 4.7 08/09/2023 0901   CL 105 08/09/2023 0901   CO2 25 08/09/2023 0901   GLUCOSE 98 08/09/2023 0901   BUN 16 08/09/2023 0901   BUN 14 08/29/2021 1127   CREATININE 0.79 08/09/2023 0901   CREATININE 0.72 02/01/2020 0834   CALCIUM  9.4 08/09/2023 0901   GFRNONAA 82 (L) 02/17/2014 0545   GFRNONAA 84 11/17/2013 0915   GFRAA >90 02/17/2014 0545   GFRAA >89 11/17/2013 0915   Lab Results  Component Value Date   HGBA1C 5.5 08/09/2023   HGBA1C 6.8 (H) 06/23/2009   Lab Results  Component Value Date   INSULIN  10.0 08/29/2021  Lab Results  Component Value Date   TSH 3.530 08/29/2021   CBC    Component Value Date/Time   WBC 12.3 (H) 09/05/2021 0927   RBC 4.35 09/05/2021 0927   HGB 13.0 09/05/2021 0927   HGB 13.5 08/29/2021 1127   HCT 38.9 09/05/2021 0927   HCT 39.8 08/29/2021 1127   PLT 384.0 09/05/2021 0927   PLT 530 (H) 08/29/2021 1127   MCV 89.6 09/05/2021 0927   MCV 88 08/29/2021 1127   MCH 29.7 08/29/2021 1127   MCH 29.3 02/17/2014 0545   MCHC 33.4 09/05/2021 0927   RDW 13.8 09/05/2021 0927   RDW 13.2 08/29/2021 1127   Iron Studies No results found for: IRON, TIBC, FERRITIN, IRONPCTSAT Lipid Panel     Component Value Date/Time   CHOL 251 (H) 08/09/2023 0901   CHOL 273 (H) 08/29/2021 1127   TRIG 89.0 08/09/2023 0901   HDL 80.10 08/09/2023 0901   HDL 75 08/29/2021 1127   CHOLHDL 3 08/09/2023 0901   VLDL  17.8 08/09/2023 0901   LDLCALC 153 (H) 08/09/2023 0901   LDLCALC 168 (H) 08/29/2021 1127   LDLCALC 170 (H) 02/01/2020 0834   LDLDIRECT 189.0 02/06/2021 0909   Hepatic Function Panel     Component Value Date/Time   PROT 6.5 08/09/2023 0901   PROT 7.3 08/29/2021 1127   ALBUMIN 4.3 08/09/2023 0901   ALBUMIN 5.0 (H) 08/29/2021 1127   AST 14 08/09/2023 0901   ALT 10 08/09/2023 0901   ALKPHOS 39 08/09/2023 0901   BILITOT 0.4 08/09/2023 0901   BILITOT 0.3 08/29/2021 1127   BILIDIR 0.1 11/17/2013 0915   IBILI 0.3 11/17/2013 0915      Component Value Date/Time   TSH 3.530 08/29/2021 1127   Nutritional Lab Results  Component Value Date   VD25OH 56.93 08/09/2023   VD25OH 55.56 08/08/2022   VD25OH 90.14 04/09/2022    Attestations:   I, Special Puri, acting as a Stage manager for [employer], DO., have compiled all relevant documentation for today's office visit on behalf of Barnie Jenkins, DO, while in the presence of [employer], DO.  I have reviewed the above documentation for accuracy and completeness, and I agree with the above. Barnie JINNY Jenkins, D.O.  The 21st Century Cures Act was signed into law in 2016 which includes the topic of electronic health records.  This provides immediate access to information in MyChart.  This includes consultation notes, operative notes, office notes, lab results and pathology reports.  If you have any questions about what you read please let us  know at your next visit so we can discuss your concerns and take corrective action if need be.  We are right here with you.

## 2024-01-28 ENCOUNTER — Other Ambulatory Visit: Payer: Self-pay | Admitting: Family

## 2024-01-28 ENCOUNTER — Other Ambulatory Visit (HOSPITAL_BASED_OUTPATIENT_CLINIC_OR_DEPARTMENT_OTHER): Payer: Self-pay

## 2024-01-28 MED ORDER — OMEPRAZOLE 40 MG PO CPDR
40.0000 mg | DELAYED_RELEASE_CAPSULE | Freq: Every day | ORAL | 0 refills | Status: DC
  Filled 2024-01-28: qty 90, 90d supply, fill #0

## 2024-02-03 ENCOUNTER — Other Ambulatory Visit (HOSPITAL_COMMUNITY): Payer: Self-pay

## 2024-02-03 ENCOUNTER — Other Ambulatory Visit: Payer: Self-pay

## 2024-02-03 ENCOUNTER — Other Ambulatory Visit: Payer: Self-pay | Admitting: Family

## 2024-02-03 MED ORDER — PROLIA 60 MG/ML ~~LOC~~ SOSY
60.0000 mg | PREFILLED_SYRINGE | SUBCUTANEOUS | 0 refills | Status: DC
  Filled 2024-02-03 – 2024-02-11 (×2): qty 1, 180d supply, fill #0

## 2024-02-03 NOTE — Progress Notes (Signed)
 Specialty Pharmacy Refill Coordination Note  [name] is a 83 y.o. female contacted today regarding refills of specialty medication(s) Denosumab  (PROLIA )   Patient requested Courier to Provider Office   Delivery date: 02/06/24   Verified address: [facility]   Medication will be filled on 02/05/24, pending refill approval. May be changing to Jubbonti. Injection potentially 9/30.

## 2024-02-04 ENCOUNTER — Other Ambulatory Visit: Payer: Self-pay | Admitting: Pharmacy Technician

## 2024-02-04 ENCOUNTER — Other Ambulatory Visit: Payer: Self-pay

## 2024-02-05 ENCOUNTER — Other Ambulatory Visit: Payer: Self-pay

## 2024-02-05 NOTE — Progress Notes (Signed)
 Office is still not able to document properly for Jubbonti. They are getting it built into their system. May be able to in October. Will follow up then.

## 2024-02-11 ENCOUNTER — Other Ambulatory Visit (HOSPITAL_COMMUNITY): Payer: Self-pay

## 2024-02-11 ENCOUNTER — Other Ambulatory Visit: Payer: Self-pay

## 2024-02-11 ENCOUNTER — Ambulatory Visit: Admitting: Family

## 2024-02-11 VITALS — BP 139/56 | HR 63 | Temp 99.2°F | Resp 16 | Ht 62.0 in | Wt 138.4 lb

## 2024-02-11 DIAGNOSIS — I10 Essential (primary) hypertension: Secondary | ICD-10-CM

## 2024-02-11 DIAGNOSIS — M810 Age-related osteoporosis without current pathological fracture: Secondary | ICD-10-CM | POA: Diagnosis not present

## 2024-02-11 DIAGNOSIS — R739 Hyperglycemia, unspecified: Secondary | ICD-10-CM | POA: Diagnosis not present

## 2024-02-11 DIAGNOSIS — K219 Gastro-esophageal reflux disease without esophagitis: Secondary | ICD-10-CM | POA: Diagnosis not present

## 2024-02-11 DIAGNOSIS — R7303 Prediabetes: Secondary | ICD-10-CM

## 2024-02-11 DIAGNOSIS — Z23 Encounter for immunization: Secondary | ICD-10-CM

## 2024-02-11 DIAGNOSIS — E782 Mixed hyperlipidemia: Secondary | ICD-10-CM

## 2024-02-11 DIAGNOSIS — K2270 Barrett's esophagus without dysplasia: Secondary | ICD-10-CM

## 2024-02-11 NOTE — Assessment & Plan Note (Signed)
Stable on omeprazole 40mg .

## 2024-02-11 NOTE — Assessment & Plan Note (Signed)
 Lab Results  Component Value Date   HGBA1C 5.5 08/09/2023   HGBA1C 5.6 02/08/2023   HGBA1C 5.7 08/08/2022   Lab Results  Component Value Date   MICROALBUR 0.9 08/09/2023   LDLCALC 153 (H) 08/09/2023   CREATININE 0.79 08/09/2023   Last A1C was normal.

## 2024-02-11 NOTE — Assessment & Plan Note (Signed)
 BP stable on metoprolol , amlodipine  and hydralazine . Continue same.

## 2024-02-11 NOTE — Assessment & Plan Note (Signed)
 Lab Results  Component Value Date   CHOL 251 (H) 08/09/2023   HDL 80.10 08/09/2023   LDLCALC 153 (H) 08/09/2023   LDLDIRECT 189.0 02/06/2021   TRIG 89.0 08/09/2023   CHOLHDL 3 08/09/2023   Statin intolerant, continues zetia .

## 2024-02-11 NOTE — Progress Notes (Signed)
 Subjective:     Patient ID: [name], female    DOB: 09-13-1940, 83 y.o.   MRN: [id number]  Chief Complaint  Patient presents with   Hypertension    Here for follow up    Hypertension    Discussed the use of AI scribe software for clinical note transcription with the patient, who gave verbal consent to proceed.  History of Present Illness  Discussed the use of AI scribe software for clinical note transcription with the patient, who gave verbal consent to proceed.  History of Present Illness   [name] is an 83 year old female who presents for a Prolia  injection refill and follow-up on multiple chronic conditions.  She is experiencing delays in receiving her Prolia  injection due to system documentation issues and is coordinating with the pharmacy and office to resolve this. Her blood pressure is well-controlled with amlodipine , hydralazine , and metoprolol , with a recent reading of 139/56 mmHg. Her diabetes test results are normal, and she is aware that it reflects a 90-day average despite recent sugar intake concerns. She cannot tolerate statins and is taking Zetia  for cholesterol management, noting her cholesterol was previously very high. She has Barrett's esophagus, managed with omeprazole  40 mg, and her reflux symptoms have improved. She has declined further colonoscopy due to age-related risks but is open to endoscopy if necessary. She is considering the updated COVID booster but has not yet decided.    Health Maintenance Due  Topic Date Due   COVID-19 Vaccine (7 - 2025-26 season) 01/13/2024   HEMOGLOBIN A1C  02/09/2024    Past Medical History:  Diagnosis Date   Allergy    Arthritis    osteoarthritis   Back pain    Barrett's esophagus    Cataract    bilateral cateracts removed   Chronic kidney disease    kidney stones   Chronic obstructive bronchitis (HCC)    Dr Brien   Colitis    Colon polyps    GERD (gastroesophageal reflux disease)     History of chicken pox    History of nephrolithiasis    History of transfusion of packed red blood cells    Hypercholesteremia    Hyperlipidemia    Hypertension    IFG (impaired fasting glucose)    Osteoarthritis    Osteoporosis    pt cannot tolerate fosamax   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   Swelling of both lower extremities    Urinary incontinence     Past Surgical History:  Procedure Laterality Date   ABDOMINAL HYSTERECTOMY  1970   APPENDECTOMY     CHOLECYSTECTOMY     EYE SURGERY  2010   FRACTURE SURGERY  2007   JOINT REPLACEMENT  2007   LEG SURGERY  2008   right   TOTAL KNEE ARTHROPLASTY  04/2006   total left knee replacement    Family History  Problem Relation Age of Onset   Coronary artery disease Mother    Arthritis Mother    High blood pressure Mother    Heart disease Mother    High blood pressure Father    Heart disease Father    Asthma Maternal Grandmother    Tuberculosis Other    Colon cancer Neg Hx    Esophageal cancer Neg Hx    Rectal cancer Neg Hx    Stomach cancer Neg Hx     Social History   Socioeconomic History  Marital status: Widowed    Spouse name: Not on file   Number of children: Not on file   Years of education: Not on file   Highest education level: Never attended school  Occupational History   Occupation: Retired  Tobacco Use   Smoking status: Former    Current packs/day: 0.00    Types: Cigarettes    Start date: 05/14/1952    Quit date: 05/15/1967    Years since quitting: 56.7   Smokeless tobacco: Never  Substance and Sexual Activity   Alcohol use: No   Drug use: No   Sexual activity: Never  Other Topics Concern   Not on file  Social History Narrative   Regular exercise: yes   Social Drivers of Health   Financial Resource Strain: Low Risk  (02/04/2024)   Overall Financial Resource Strain (CARDIA)    Difficulty of Paying Living Expenses: Not hard at all  Food  Insecurity: No Food Insecurity (02/04/2024)   Hunger Vital Sign    Worried About Running Out of Food in the Last Year: Never true    Ran Out of Food in the Last Year: Never true  Transportation Needs: No Transportation Needs (02/04/2024)   PRAPARE - Administrator, Civil Service (Medical): No    Lack of Transportation (Non-Medical): No  Physical Activity: Sufficiently Active (02/04/2024)   Exercise Vital Sign    Days of Exercise per Week: 6 days    Minutes of Exercise per Session: 30 min  Stress: No Stress Concern Present (02/04/2024)   [employer] of Occupational Health - Occupational Stress Questionnaire    Feeling of Stress: Not at all  Social Connections: Moderately Integrated (02/04/2024)   Social Connection and Isolation Panel    Frequency of Communication with Friends and Family: More than three times a week    Frequency of Social Gatherings with Friends and Family: Once a week    Attends Religious Services: More than 4 times per year    Active Member of [employer] or Organizations: Yes    Attends Banker Meetings: More than 4 times per year    Marital Status: Widowed  Intimate Partner Violence: Not At Risk (10/15/2023)   Humiliation, Afraid, Rape, and Kick questionnaire    Fear of Current or Ex-Partner: No    Emotionally Abused: No    Physically Abused: No    Sexually Abused: No    Outpatient Medications Prior to Visit  Medication Sig Dispense Refill   amLODipine  (NORVASC ) 10 MG tablet Take 1 tablet (10 mg total) by mouth daily. 90 tablet 1   Ascorbic Acid (VITAMIN C) 1000 MG tablet Take 1,000 mg by mouth daily.     aspirin  (ASPIRIN  81) 81 MG EC tablet Take 1 tablet (81 mg total) by mouth daily. Swallow whole. 30 tablet 12   Calcium  Carb-Cholecalciferol (CALCIUM  600+D) 600-10 MG-MCG TABS Take 600 mg by mouth daily.     carboxymethylcellulose (REFRESH PLUS) 0.5 % SOLN Place 1 drop into both eyes 2 (two) times daily.     Cholecalciferol (VITAMIN D3) 50  MCG (2000 UT) capsule Take 1 capsule (2,000 Units total) by mouth daily.     Coenzyme Q10 200 MG capsule Take 200 mg by mouth daily.     denosumab  (PROLIA ) 60 MG/ML SOSY injection Inject 60 mg into the skin every 6 (six) months. Dx code: M81.0.  pt has appt 08/15/23. 1 mL 0   ezetimibe  (ZETIA ) 10 MG tablet Take 1 tablet (10 mg total)  by mouth daily. 90 tablet 1   fluticasone  (FLONASE ) 50 MCG/ACT nasal spray 1 spray each nostril following sinus rinses twice daily 16 g 2   hydrALAZINE  (APRESOLINE ) 25 MG tablet Take 1 tablet (25 mg total) by mouth 3 (three) times daily. 270 tablet 1   ibuprofen (ADVIL,MOTRIN) 200 MG tablet Take 400 mg by mouth daily.     Magnesium 400 MG TABS Take 400 mg by mouth daily.     metoprolol  succinate (TOPROL -XL) 50 MG 24 hr tablet Take 1.5 tablets (75 mg total) by mouth daily.Take with or immediately following a meal. 135 tablet 1   Multiple Vitamin (MULTIVITAMIN) tablet Take 1 tablet by mouth daily.     niacin  500 MG tablet Take 500 mg by mouth 2 (two) times daily with a meal.     Omega-3 Fatty Acids (FISH OIL) 1000 MG CAPS Take 1 capsule by mouth every morning. (Krill Oil)     omeprazole  (PRILOSEC) 40 MG capsule Take 1 capsule (40 mg total) by mouth daily. 90 capsule 0   sodium chloride  (MURO 128) 5 % ophthalmic solution Place 1 drop into both eyes 2 (two) to 3 (three) times daily. 15 mL 6   Facility-Administered Medications Prior to Visit  Medication Dose Route Frequency Provider Last Rate Last Admin   denosumab  (PROLIA ) injection 60 mg  60 mg Subcutaneous Q6 months O'Sullivan, Bettina Warn, NP        Allergies  Allergen Reactions   Atorvastatin Other (See Comments)    REACTION: MUSCLE PAINS   Diclofenac-Misoprostol Diarrhea and Nausea And Vomiting   Fenofibrate Other (See Comments)    REACTION: carpopedal spasms   Rosuvastatin Other (See Comments)    REACTION: MUSCLE PAINS   Statins Other (See Comments)    myalgias   Amoxicillin-Pot Clavulanate Other (See  Comments)    REACTION: unspecified   Penicillins Hives   Sulfonamide Derivatives Hives   Tuberculin Ppd Other (See Comments)    Redness at sight    ROS     Objective:    Physical Exam Constitutional:      General: She is not in acute distress.    Appearance: Normal appearance. She is well-developed.  HENT:     Head: Normocephalic and atraumatic.     Right Ear: External ear normal.     Left Ear: External ear normal.  Eyes:     General: No scleral icterus. Neck:     Thyroid : No thyromegaly.  Cardiovascular:     Rate and Rhythm: Normal rate and regular rhythm.     Heart sounds: Normal heart sounds. No murmur heard. Pulmonary:     Effort: Pulmonary effort is normal. No respiratory distress.     Breath sounds: Normal breath sounds. No wheezing.  Musculoskeletal:     Cervical back: Neck supple.  Skin:    General: Skin is warm and dry.  Neurological:     Mental Status: She is alert and oriented to person, place, and time.  Psychiatric:        Mood and Affect: Mood normal.        Behavior: Behavior normal.        Thought Content: Thought content normal.        Judgment: Judgment normal.      BP (!) 139/56 (BP Location: Right Arm, Patient Position: Sitting, Cuff Size: Small)   Pulse 63   Temp 99.2 F (37.3 C) (Oral)   Resp 16   Ht 5' 2 (1.575 m)   Wt 138  lb 6.4 oz (62.8 kg)   LMP 05/14/1968   SpO2 98%   BMI 25.31 kg/m  Wt Readings from Last 3 Encounters:  02/11/24 138 lb 6.4 oz (62.8 kg)  01/27/24 132 lb (59.9 kg)  10/30/23 133 lb (60.3 kg)       Assessment & Plan:   Problem List Items Addressed This Visit       Unprioritized   Primary hypertension   BP stable on metoprolol , amlodipine  and hydralazine . Continue same.       Prediabetes   Lab Results  Component Value Date   HGBA1C 5.5 08/09/2023   HGBA1C 5.6 02/08/2023   HGBA1C 5.7 08/08/2022   Lab Results  Component Value Date   MICROALBUR 0.9 08/09/2023   LDLCALC 153 (H) 08/09/2023    CREATININE 0.79 08/09/2023   Last A1C was normal.        Osteoporosis   Working on getting Jubonti order- see pharm tech note.       Mixed hyperlipidemia   Lab Results  Component Value Date   CHOL 251 (H) 08/09/2023   HDL 80.10 08/09/2023   LDLCALC 153 (H) 08/09/2023   LDLDIRECT 189.0 02/06/2021   TRIG 89.0 08/09/2023   CHOLHDL 3 08/09/2023   Statin intolerant, continues zetia .       Relevant Orders   Comp Met (CMET)   GERD   Stable on omeprazole  40mg .       BARRETTS ESOPHAGUS   Continues PPI.       Relevant Orders   Ambulatory referral to Gastroenterology   Other Visit Diagnoses       Needs flu shot    -  Primary   Relevant Orders   Flu vaccine HIGH DOSE PF(Fluzone Trivalent) (Completed)     Hyperglycemia       Relevant Orders   HgB A1c   Urine Microalbumin w/creat. ratio       I am having [name] maintain her Coenzyme Q10, Fish Oil, multivitamin, vitamin C, carboxymethylcellulose, niacin , ibuprofen, aspirin  EC, Magnesium, Calcium  Carb-Cholecalciferol, Vitamin D3, fluticasone , hydrALAZINE , ezetimibe , sodium chloride , amLODipine , metoprolol  succinate, omeprazole , and Prolia . We will continue to administer denosumab .  No orders of the defined types were placed in this encounter.

## 2024-02-11 NOTE — Patient Instructions (Signed)
 VISIT SUMMARY:  Today, we addressed your Prolia  injection refill and followed up on your chronic conditions, including blood pressure, diabetes, cholesterol, Barrett's esophagus, and GERD. We also discussed your general health maintenance, including vaccinations.  YOUR PLAN:  BARRETT'S ESOPHAGUS: You have Barrett's esophagus, which can increase the risk of esophageal cancer. -We will refer you to a gastrointestinal specialist to discuss the possibility of a surveillance endoscopy.  GASTROESOPHAGEAL REFLUX DISEASE (GERD): Your GERD is well-controlled with your current medication. -Continue taking omeprazole  40 mg as prescribed.  OSTEOPOROSIS: You are experiencing delays in receiving your Prolia  injection due to system issues. -We will send a reminder to follow up on the Prolia  refill process in a couple of weeks.  HYPERTENSION: Your blood pressure is well-controlled with your current medications. -Continue your current medication regimen.  HYPERLIPIDEMIA: Your cholesterol is being managed with Zetia  due to your intolerance to statins. -Continue taking Zetia  as prescribed.  TYPE 2 DIABETES MELLITUS, CONTROLLED: Your diabetes is well-controlled with recent normal test results. -We will order lab tests to monitor your diabetes.  GENERAL HEALTH MAINTENANCE: You received a flu vaccination and are considering the updated COVID booster. -Consider getting the updated COVID booster.  FOLLOW-UP: You need to schedule a lab visit for blood work. -Schedule a lab visit before 8 AM for a blood draw with your preferred phlebotomist.

## 2024-02-11 NOTE — Assessment & Plan Note (Addendum)
 Continues PPI. Will arrange follow up with GI to see if they recommend surveillance endoscopy.

## 2024-02-11 NOTE — Assessment & Plan Note (Signed)
 Working on getting Jubonti order- see pharm tech note.

## 2024-02-12 ENCOUNTER — Other Ambulatory Visit (INDEPENDENT_AMBULATORY_CARE_PROVIDER_SITE_OTHER)

## 2024-02-12 DIAGNOSIS — R739 Hyperglycemia, unspecified: Secondary | ICD-10-CM | POA: Diagnosis not present

## 2024-02-12 DIAGNOSIS — E782 Mixed hyperlipidemia: Secondary | ICD-10-CM

## 2024-02-12 LAB — COMPREHENSIVE METABOLIC PANEL WITH GFR
ALT: 11 U/L (ref 0–35)
AST: 16 U/L (ref 0–37)
Albumin: 4.2 g/dL (ref 3.5–5.2)
Alkaline Phosphatase: 35 U/L — ABNORMAL LOW (ref 39–117)
BUN: 11 mg/dL (ref 6–23)
CO2: 24 meq/L (ref 19–32)
Calcium: 9.6 mg/dL (ref 8.4–10.5)
Chloride: 103 meq/L (ref 96–112)
Creatinine, Ser: 0.71 mg/dL (ref 0.40–1.20)
GFR: 78.95 mL/min (ref >60.00)
Glucose, Bld: 97 mg/dL (ref 70–99)
Potassium: 4 meq/L (ref 3.5–5.1)
Sodium: 135 meq/L (ref 135–145)
Total Bilirubin: 0.5 mg/dL (ref 0.2–1.2)
Total Protein: 6.2 g/dL (ref 6.0–8.3)

## 2024-02-12 LAB — HEMOGLOBIN A1C: Hgb A1c MFr Bld: 5.7 % (ref 4.6–6.5)

## 2024-02-13 ENCOUNTER — Ambulatory Visit: Payer: Self-pay | Admitting: Family

## 2024-02-13 LAB — MICROALBUMIN / CREATININE URINE RATIO
Creatinine,U: 62.4 mg/dL
Microalb Creat Ratio: 20.7 mg/g (ref 0.0–30.0)
Microalb, Ur: 1.3 mg/dL (ref 0.0–1.9)

## 2024-02-21 ENCOUNTER — Other Ambulatory Visit: Payer: Self-pay | Admitting: *Deleted

## 2024-02-21 DIAGNOSIS — M810 Age-related osteoporosis without current pathological fracture: Secondary | ICD-10-CM

## 2024-02-21 MED ORDER — DENOSUMAB-BBDZ 60 MG/ML ~~LOC~~ SOSY
60.0000 mg | PREFILLED_SYRINGE | Freq: Once | SUBCUTANEOUS | Status: AC
Start: 2024-03-02 — End: 2024-03-20
  Administered 2024-03-20: 60 mg via SUBCUTANEOUS

## 2024-02-23 ENCOUNTER — Other Ambulatory Visit: Payer: Self-pay | Admitting: Family

## 2024-02-24 ENCOUNTER — Other Ambulatory Visit (HOSPITAL_BASED_OUTPATIENT_CLINIC_OR_DEPARTMENT_OTHER): Payer: Self-pay

## 2024-02-24 MED ORDER — HYDRALAZINE HCL 25 MG PO TABS
25.0000 mg | ORAL_TABLET | Freq: Three times a day (TID) | ORAL | 1 refills | Status: AC
  Filled 2024-02-24: qty 270, 90d supply, fill #0
  Filled 2024-05-21: qty 270, 90d supply, fill #1

## 2024-03-02 ENCOUNTER — Telehealth: Payer: Self-pay | Admitting: Family

## 2024-03-02 NOTE — Telephone Encounter (Deleted)
-----   Message from Eleanor GORMAN Ponto sent at 02/11/2024 10:16 AM EDT ----- tamala

## 2024-03-02 NOTE — Telephone Encounter (Signed)
 Can we please re-initiate Jubonti approval?

## 2024-03-04 ENCOUNTER — Telehealth: Payer: Self-pay

## 2024-03-04 ENCOUNTER — Other Ambulatory Visit (HOSPITAL_COMMUNITY): Payer: Self-pay

## 2024-03-04 DIAGNOSIS — M810 Age-related osteoporosis without current pathological fracture: Secondary | ICD-10-CM

## 2024-03-04 NOTE — Telephone Encounter (Signed)
 Pt ready for scheduling for PROLIA  on or after : 03/04/24  Option# 1: Buy/Bill (Office supplied medication)  Out-of-pocket cost due at time of clinic visit: $332  Number of injection/visits approved: ---  Primary: [employer] Prolia  co-insurance: 20% Admin fee co-insurance: 0%  Secondary: --- Prolia  co-insurance:  Admin fee co-insurance:   Medical Benefit Details: Date Benefits were checked: 03/04/24 Deductible: NO/ Coinsurance: 20%/ Admin Fee: 0%  Prior Auth: N/A PA# Expiration Date:   # of doses approved: ----------------------------------------------------------------------- Option# 2- Med Obtained from pharmacy: Prolia  is no longer preferred for pharmacy benefit. Jubbonti is now preferred. PRICING IS FOR JUBBONTI  Pharmacy benefit: Copay $250 (Paid to pharmacy) Admin Fee: 0% (Pay at clinic)  Prior Auth: N/A PA# Expiration Date:   # of doses approved:   If patient wants fill through the pharmacy benefit please send prescription to: [facility], and include estimated need by date in rx notes. Pharmacy will ship medication directly to the office.  Patient NOT eligible for Prolia  Copay Card. Copay Card can make patient's cost as little as $25. Link to apply: https://www.amgensupportplus.com/copay  ** This summary of benefits is an estimation of the patient's out-of-pocket cost. Exact cost may very based on individual plan coverage.

## 2024-03-06 ENCOUNTER — Other Ambulatory Visit: Payer: Self-pay

## 2024-03-06 MED ORDER — DENOSUMAB-BBDZ 60 MG/ML ~~LOC~~ SOSY
60.0000 mg | PREFILLED_SYRINGE | SUBCUTANEOUS | 0 refills | Status: AC
Start: 2024-03-06 — End: ?
  Filled 2024-03-06 – 2024-03-18 (×2): qty 1, 180d supply, fill #0

## 2024-03-06 NOTE — Telephone Encounter (Signed)
 Pt scheduled for 03/12/24.

## 2024-03-06 NOTE — Telephone Encounter (Signed)
 Patient reports she received a call from the office.  Was this in reference to prolia  or jubbonti?

## 2024-03-06 NOTE — Addendum Note (Signed)
 Addended by: ESTELLE GILLIS D on: 03/06/2024 03:35 PM   Modules accepted: Orders

## 2024-03-07 ENCOUNTER — Other Ambulatory Visit: Payer: Self-pay | Admitting: Family

## 2024-03-08 MED ORDER — EZETIMIBE 10 MG PO TABS
10.0000 mg | ORAL_TABLET | Freq: Every day | ORAL | 1 refills | Status: AC
  Filled 2024-03-08: qty 90, 90d supply, fill #0
  Filled 2024-06-04: qty 90, 90d supply, fill #1

## 2024-03-09 ENCOUNTER — Other Ambulatory Visit (HOSPITAL_BASED_OUTPATIENT_CLINIC_OR_DEPARTMENT_OTHER): Payer: Self-pay

## 2024-03-12 ENCOUNTER — Ambulatory Visit

## 2024-03-18 ENCOUNTER — Other Ambulatory Visit: Payer: Self-pay

## 2024-03-18 NOTE — Progress Notes (Signed)
 Specialty Pharmacy Initial Fill Coordination Note  [name] is a 83 y.o. female contacted today regarding initial fill of specialty medication(s) Denosumab -bbdz BARTON)   Patient requested Courier to Provider Office   Delivery date: 03/19/24   Verified address: [facility] -[address]   Medication will be filled on: 03/18/24   Patient is aware of $250 copayment.

## 2024-03-20 ENCOUNTER — Ambulatory Visit (INDEPENDENT_AMBULATORY_CARE_PROVIDER_SITE_OTHER): Admitting: *Deleted

## 2024-03-20 DIAGNOSIS — M810 Age-related osteoporosis without current pathological fracture: Secondary | ICD-10-CM

## 2024-03-20 MED ORDER — DENOSUMAB-BBDZ 60 MG/ML ~~LOC~~ SOSY
60.0000 mg | PREFILLED_SYRINGE | Freq: Once | SUBCUTANEOUS | Status: AC
Start: 2024-09-10 — End: ?

## 2024-03-20 NOTE — Progress Notes (Signed)
 Patient here for first Jubbonti injection per physicians orders  Jubbonti 60 mg SQ , was administered left arm today. Patient tolerated injection.  Patient next injection due: 6 months, appt made:  No- will schedule in 5 months after benefits are ran again  Initial injection of Jubbonti: yes  Did Jubbonti come from pharmacy (if yes please select patient supplied): yes  Cam placed for next injection: yes

## 2024-03-30 ENCOUNTER — Ambulatory Visit (INDEPENDENT_AMBULATORY_CARE_PROVIDER_SITE_OTHER): Payer: Self-pay | Admitting: Family Medicine

## 2024-03-30 ENCOUNTER — Encounter (INDEPENDENT_AMBULATORY_CARE_PROVIDER_SITE_OTHER): Payer: Self-pay | Admitting: Family Medicine

## 2024-03-30 VITALS — BP 132/57 | HR 61 | Temp 97.8°F | Ht 59.0 in | Wt 130.0 lb

## 2024-03-30 DIAGNOSIS — I10 Essential (primary) hypertension: Secondary | ICD-10-CM | POA: Diagnosis not present

## 2024-03-30 DIAGNOSIS — Z6824 Body mass index (BMI) 24.0-24.9, adult: Secondary | ICD-10-CM

## 2024-03-30 DIAGNOSIS — E669 Obesity, unspecified: Secondary | ICD-10-CM | POA: Diagnosis not present

## 2024-03-30 DIAGNOSIS — F509 Eating disorder, unspecified: Secondary | ICD-10-CM

## 2024-03-30 DIAGNOSIS — E66811 Obesity, class 1: Secondary | ICD-10-CM

## 2024-03-30 DIAGNOSIS — R7303 Prediabetes: Secondary | ICD-10-CM | POA: Diagnosis not present

## 2024-03-30 DIAGNOSIS — Z6826 Body mass index (BMI) 26.0-26.9, adult: Secondary | ICD-10-CM

## 2024-03-30 DIAGNOSIS — E782 Mixed hyperlipidemia: Secondary | ICD-10-CM | POA: Diagnosis not present

## 2024-03-30 NOTE — Progress Notes (Signed)
 [name], D.O.  ABFM, ABOM Specializing in Clinical Bariatric Medicine  Office located at: [address] [place], [place]  [address]      A) FOR THE CHRONIC DISEASE OF OBESITY:  Chief complaint: Obesity [name] is here to discuss her progress with her obesity treatment plan.   Obesity (BMI 30.0-34.9) - starting BMI 33.2 BMI 24.0-24.9, adult -- Current BMI 26.24   History of present illness / Interval history:  [name] is here today for her follow-up office visit.  Since last OV on 01/27/2024, pt is down 2 lbs.     01/27/24 11:00 03/30/24   Body Fat % 39.2 % 42 %  Muscle Mass (lbs) 76.2 lbs 71.8 lbs  Fat Mass (lbs) 52 lbs 54.6 lbs  Total Body Water (lbs) 59.4 lbs 54.8 lbs  Visceral Fat Rating  11 12  Counseling done on how various foods will affect these numbers and how to maximize success   Total lbs lost to date: -40 lbs Total Fat Mass in lbs lost to date: -26.8 Total weight loss percentage to date: -23.53 %   Nutrition Therapy She is on the Category 2 Plan and states she is following her eating plan approximately 50 % of the time.   - Tracking Calories/Macros: no  - Eating More Whole Foods: yes  - Adequate Protein Intake: yes  - Adequate Water Intake: yes  - Skipping Meals: yes; sometimes skips meals   - Sleeping 7-9 Hours/ Night: no   [name] is currently in the action stage of change. As such, her goal is to continue weight management plan.  She has agreed to: continue current plan   Physical Activity Pt is doing resistance chair workouts 30  minutes 5 days per week   [name] has been advised to work up to 300-450 minutes of moderate intensity aerobic activity a week and strengthening exercises 2-3 times per week for cardiovascular health, weight loss maintenance and preservation of muscle mass.  She has agreed to : Think about enjoyable ways to increase daily physical activity and overcoming barriers to exercise, Increase  physical activity in their day and reduce sedentary time (increase NEAT)., Increase volume of physical activity to a goal of 240 minutes a week, and Combine aerobic and strengthening exercises for efficiency and improved cardiometabolic health.   Behavioral Modifications Evidence-based interventions for health behavior change were utilized today including the discussion of  1) self monitoring techniques:  mindful eating 2) problem-solving barriers:  increased stress 3) self care:  exercise, mindful eating, stress management strategies 4) SMART goals for next OV:  none Regarding patient's less desirable eating habits and patterns, we employed the technique of small changes.   We discussed the following today: increasing lean protein intake to established goals, avoiding skipping meals, increasing water intake , practice mindfulness eating and understand the difference between hunger signals and cravings, and using GPT or another AI platform for recipe ideas- searching low calorie, low carb, high protein chicken recipes etc, crust less, egg-white quiche recipe. Additional resources provided today: Handout on traveling and holiday eating strategies   Medical Interventions/ Pharmacotherapy Previous Bariatric surgery: none Pharmacotherapy for weight loss: She is not currently taking medications  for medical weight loss.    We discussed various medication options to help [name] with her weight loss efforts and we both agreed to : Continue with current nutritional and behavioral strategies   B) OBESITY RELATED CONDITIONS ADDRESSED TODAY:  Prediabetes Assessment & Plan Lab Results  Component Value Date   HGBA1C 5.7 02/12/2024   HGBA1C 5.5 08/09/2023   HGBA1C 5.6 02/08/2023   INSULIN  10.0 08/29/2021  Managed with dietary and lifestyle interventions. Reports hunger and cravings have been overall well controlled, but sometimes when she is stressed she has increased cravings. A1c increased from  5.5 on 08/09/2023 to 5.7 on 02/12/2024. No acute concerns today. Cont prudent nutritional meal plan. Increase water intake and increase exercise as able.     Essential hypertension Assessment & Plan BP Readings from Last 3 Encounters:  03/30/24 (!) 132/57  02/11/24 (!) 139/56  10/30/23 (!) 146/65   The ASCVD Risk score (Arnett DK, et al., 2019) failed to calculate for the following reasons:   The 2019 ASCVD risk score is only valid for ages 58 to 51  Lab Results  Component Value Date   CREATININE 0.71 02/12/2024  On Norvasc  10 mg daily, Hydralazine  25 mg TID, and Toprol -XL 75 mg daily with reported good compliance and tolerance. BP is slightly elevated at 132/57. Pt asx. No acute concerns today. Cont medication. F/up with PCP as needed. Cont low-sodium meal plan. Increase water intake and exercise as able.     Mixed hyperlipidemia Assessment & Plan Lab Results  Component Value Date   CHOL 251 (H) 08/09/2023   HDL 80.10 08/09/2023   LDLCALC 153 (H) 08/09/2023   LDLDIRECT 189.0 02/06/2021   TRIG 89.0 08/09/2023   CHOLHDL 3 08/09/2023  Currently on Fish Oil and Zetia  10 mg daily. LDL is not at goal at 153; optimal <100. No acute concerns today. Cont medication and decreasing saturated and trans fats. Increase exercise as able.     Eating disorder- EE Assessment & Plan Reports she has increased hunger and cravings sometimes when she is stressed. Encouraged to practice mindful eating and implement stress management strategies. Will continue to monitor.    Follow up:   Return 06/29/2024 12:00 PM.  She was informed of the importance of frequent follow up visits to maximize her success with intensive lifestyle modifications for her multiple health conditions.   Weight Summary and Biometrics   Weight Lost Since Last Visit: 2 lb  Weight Gained Since Last Visit: 0    Vitals Temp: 97.8 F (36.6 C) BP: (!) 132/57 Pulse Rate: 61 SpO2: 95 %   Anthropometric  Measurements Height: 4' 11 (1.499 m) Weight: 130 lb (59 kg) BMI (Calculated): 26.24 Weight at Last Visit: 132 lb Weight Lost Since Last Visit: 2 lb Weight Gained Since Last Visit: 0 Starting Weight: 170 lb Total Weight Loss (lbs): 40 lb (18.1 kg)   Body Composition  Body Fat %: 42 % Fat Mass (lbs): 54.6 lbs Muscle Mass (lbs): 71.8 lbs Total Body Water (lbs): 54.8 lbs Visceral Fat Rating : 12   Other Clinical Data Fasting: yes Labs: no Today's Visit #: 23 Starting Date: 08/29/21 Comments: re'ck height today-4'11    Objective:   PHYSICAL EXAM: Blood pressure (!) 132/57, pulse 61, temperature 97.8 F (36.6 C), height 4' 11 (1.499 m), weight 130 lb (59 kg), last menstrual period 05/14/1968, SpO2 95%. Body mass index is 26.26 kg/m.  General: she is overweight, cooperative and in no acute distress. PSYCH: Has normal mood, affect and thought process.   HEENT: EOMI, sclerae are anicteric. Lungs: Normal breathing effort, no conversational dyspnea. Extremities: Moves * 4 Neurologic: A and O * 3, good insight  DIAGNOSTIC DATA REVIEWED: BMET    Component Value Date/Time   NA 135 02/12/2024 0730   NA  130 (L) 08/29/2021 1127   K 4.0 02/12/2024 0730   CL 103 02/12/2024 0730   CO2 24 02/12/2024 0730   GLUCOSE 97 02/12/2024 0730   BUN 11 02/12/2024 0730   BUN 14 08/29/2021 1127   CREATININE 0.71 02/12/2024 0730   CREATININE 0.72 02/01/2020 0834   CALCIUM  9.6 02/12/2024 0730   GFRNONAA 82 (L) 02/17/2014 0545   GFRNONAA 84 11/17/2013 0915   GFRAA >90 02/17/2014 0545   GFRAA >89 11/17/2013 0915   Lab Results  Component Value Date   HGBA1C 5.7 02/12/2024   HGBA1C 6.8 (H) 06/23/2009   Lab Results  Component Value Date   INSULIN  10.0 08/29/2021   Lab Results  Component Value Date   TSH 3.530 08/29/2021   CBC    Component Value Date/Time   WBC 12.3 (H) 09/05/2021 0927   RBC 4.35 09/05/2021 0927   HGB 13.0 09/05/2021 0927   HGB 13.5 08/29/2021 1127   HCT  38.9 09/05/2021 0927   HCT 39.8 08/29/2021 1127   PLT 384.0 09/05/2021 0927   PLT 530 (H) 08/29/2021 1127   MCV 89.6 09/05/2021 0927   MCV 88 08/29/2021 1127   MCH 29.7 08/29/2021 1127   MCH 29.3 02/17/2014 0545   MCHC 33.4 09/05/2021 0927   RDW 13.8 09/05/2021 0927   RDW 13.2 08/29/2021 1127   Iron Studies No results found for: IRON, TIBC, FERRITIN, IRONPCTSAT Lipid Panel     Component Value Date/Time   CHOL 251 (H) 08/09/2023 0901   CHOL 273 (H) 08/29/2021 1127   TRIG 89.0 08/09/2023 0901   HDL 80.10 08/09/2023 0901   HDL 75 08/29/2021 1127   CHOLHDL 3 08/09/2023 0901   VLDL 17.8 08/09/2023 0901   LDLCALC 153 (H) 08/09/2023 0901   LDLCALC 168 (H) 08/29/2021 1127   LDLCALC 170 (H) 02/01/2020 0834   LDLDIRECT 189.0 02/06/2021 0909   Hepatic Function Panel     Component Value Date/Time   PROT 6.2 02/12/2024 0730   PROT 7.3 08/29/2021 1127   ALBUMIN 4.2 02/12/2024 0730   ALBUMIN 5.0 (H) 08/29/2021 1127   AST 16 02/12/2024 0730   ALT 11 02/12/2024 0730   ALKPHOS 35 (L) 02/12/2024 0730   BILITOT 0.5 02/12/2024 0730   BILITOT 0.3 08/29/2021 1127   BILIDIR 0.1 11/17/2013 0915   IBILI 0.3 11/17/2013 0915      Component Value Date/Time   TSH 3.530 08/29/2021 1127   Nutritional Lab Results  Component Value Date   VD25OH 56.93 08/09/2023   VD25OH 55.56 08/08/2022   VD25OH 90.14 04/09/2022    Attestations:   [name], acting as a stage manager for Miranda Jenkins, DO., have compiled all relevant documentation for today's office visit on behalf of Miranda Jenkins, DO, while in the presence of Miranda & Mclennan, DO.   I have reviewed the above documentation for accuracy and completeness, and I agree with the above. Miranda [name], D.O.  The 21st Century Cures Act was signed into law in 2016 which includes the topic of electronic health records.  This provides immediate access to information in MyChart.  This includes consultation notes, operative  notes, office notes, lab results and pathology reports.  If you have any questions about what you read please let us  know at your next visit so we can discuss your concerns and take corrective action if need be.  We are right here with you.

## 2024-04-06 ENCOUNTER — Other Ambulatory Visit: Payer: Self-pay | Admitting: Family

## 2024-04-07 ENCOUNTER — Other Ambulatory Visit (HOSPITAL_BASED_OUTPATIENT_CLINIC_OR_DEPARTMENT_OTHER): Payer: Self-pay

## 2024-04-07 MED ORDER — AMLODIPINE BESYLATE 10 MG PO TABS
10.0000 mg | ORAL_TABLET | Freq: Every day | ORAL | 1 refills | Status: AC
  Filled 2024-04-07: qty 90, 90d supply, fill #0

## 2024-04-29 ENCOUNTER — Other Ambulatory Visit: Payer: Self-pay | Admitting: Family

## 2024-04-29 ENCOUNTER — Other Ambulatory Visit (HOSPITAL_BASED_OUTPATIENT_CLINIC_OR_DEPARTMENT_OTHER): Payer: Self-pay

## 2024-04-29 MED ORDER — OMEPRAZOLE 40 MG PO CPDR
40.0000 mg | DELAYED_RELEASE_CAPSULE | Freq: Every day | ORAL | 1 refills | Status: AC
  Filled 2024-04-29: qty 90, 90d supply, fill #0

## 2024-04-30 ENCOUNTER — Other Ambulatory Visit (HOSPITAL_BASED_OUTPATIENT_CLINIC_OR_DEPARTMENT_OTHER): Payer: Self-pay

## 2024-05-12 ENCOUNTER — Ambulatory Visit: Admitting: Family Medicine

## 2024-05-12 ENCOUNTER — Ambulatory Visit: Payer: Self-pay

## 2024-05-12 NOTE — Telephone Encounter (Signed)
 FYI Only or Action Required?: FYI only for provider: appointment scheduled on 12/31.  Patient was last seen in primary care on 03/30/2024 by Midge Sober, DO.  Called Nurse Triage reporting Dysuria.  Symptoms began 2 days ago.  Interventions attempted: OTC medications: Ibuprofen.  Symptoms are: gradually worsening.  Triage Disposition: See Physician Within 24 Hours  Patient/caregiver understands and will follow disposition?: Yes   Copied from CRM #[id number]. Topic: Clinical - Red Word Triage >> May 12, 2024  9:27 AM Franky GRADE wrote: Red Word that prompted transfer to Nurse Triage: Patient is experiencing uncontrolled urination, pain while urinating , and pressure in the abdomen. Reason for Disposition  Age > 50 years  Answer Assessment - Initial Assessment Questions 1. SEVERITY: How bad is the pain?  (e.g., Scale 1-10; mild, moderate, or severe)     4/10 2. FREQUENCY: How many times have you had painful urination today?      SEVERAL 3. PATTERN: Is pain present every time you urinate or just sometimes?      Every urination 4. ONSET: When did the painful urination start?      Sunday 5. FEVER: Do you have a fever? If Yes, ask: What is your temperature, how was it measured, and when did it start?     no 6. PAST UTI: Have you had a urine infection before? If Yes, ask: When was the last time? and What happened that time?      YES 7. CAUSE: What do you think is causing the painful urination?  (e.g., UTI, scratch, Herpes sore)     UTI 8. OTHER SYMPTOMS: Do you have any other symptoms? (e.g., blood in urine, flank pain, genital sores, urgency, vaginal discharge)     Abd pain  Protocols used: Urination Pain - Female-A-AH

## 2024-05-12 NOTE — Telephone Encounter (Signed)
"  Appt scheduled  "

## 2024-05-28 ENCOUNTER — Ambulatory Visit: Admitting: Gastroenterology

## 2024-05-28 ENCOUNTER — Encounter: Payer: Self-pay | Admitting: Gastroenterology

## 2024-05-28 VITALS — BP 122/68 | HR 63 | Ht 59.0 in | Wt 141.0 lb

## 2024-05-28 DIAGNOSIS — K2270 Barrett's esophagus without dysplasia: Secondary | ICD-10-CM | POA: Diagnosis not present

## 2024-05-28 DIAGNOSIS — R1310 Dysphagia, unspecified: Secondary | ICD-10-CM

## 2024-05-28 DIAGNOSIS — R1319 Other dysphagia: Secondary | ICD-10-CM

## 2024-05-28 NOTE — Progress Notes (Signed)
 "  Chief Complaint:Barrett's esophagus without dysplasia  Primary GI Doctor: Dr. San  HPI:  Patient is a  84  year old female patient with past medical history of Barretts esophagus, hyperlipidemia, and osteoarthritis, who was referred to me by Daryl Setter, NP on 02/11/24 for a evaluation of Barrett's esophagus without dysplasia.    Interval History Patient presents for evaluation of history with Barrett's esophagus.  Patient has history of GERD complicated with Barrett's esophagus. She reports her omeprazole  was increased from 20 mg to 40 mg about 6 months ago for reports of increasing pyrosis with intermittent eso dysphagia. The pyrosis has improved.  Patient denies nausea or vomiting.  She has been working on losing weight and seeing weight management for dietary recommendations and compliance.   Patient denies altered bowel habits, abdominal pain, or rectal bleeding.   Nonsmoker. No alcohol use.  Patient taking baby ASA 81mg  po daily.  No recent hospitalizations or surgeries.   Patient's family history includes: no colon CA, no esophageal CA, mother who required esophageal dilatation for strictures   Wt Readings from Last 3 Encounters:  05/28/24 141 lb (64 kg)  03/30/24 130 lb (59 kg)  02/11/24 138 lb 6.4 oz (62.8 kg)    Past Medical History:  Diagnosis Date   Allergy    Arthritis    osteoarthritis   Back pain    Barrett's esophagus    Cataract    bilateral cateracts removed   Chronic kidney disease    kidney stones   Chronic obstructive bronchitis (HCC)    Dr Brien   Colitis    Colon polyps    GERD (gastroesophageal reflux disease)    History of chicken pox    History of nephrolithiasis    History of transfusion of packed red blood cells    Hypercholesteremia    Hyperlipidemia    Hypertension    IFG (impaired fasting glucose)    Osteoarthritis    Osteoporosis    pt cannot tolerate fosamax   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  Positive PPD only, negative CXR.   Swelling of both lower extremities    Urinary incontinence     Past Surgical History:  Procedure Laterality Date   ABDOMINAL HYSTERECTOMY  1970   APPENDECTOMY     CHOLECYSTECTOMY     EYE SURGERY  2010   FRACTURE SURGERY  2007   JOINT REPLACEMENT  2007   LEG SURGERY  2008   right   TOTAL KNEE ARTHROPLASTY  04/2006   total left knee replacement    Current Outpatient Medications  Medication Sig Dispense Refill   amLODipine  (NORVASC ) 10 MG tablet Take 1 tablet (10 mg total) by mouth daily. 90 tablet 1   Ascorbic Acid (VITAMIN C) 1000 MG tablet Take 1,000 mg by mouth daily.     aspirin  (ASPIRIN  81) 81 MG EC tablet Take 1 tablet (81 mg total) by mouth daily. Swallow whole. 30 tablet 12   Calcium  Carb-Cholecalciferol (CALCIUM  600+D) 600-10 MG-MCG TABS Take 600 mg by mouth daily.     carboxymethylcellulose (REFRESH PLUS) 0.5 % SOLN Place 1 drop into both eyes 2 (two) times daily.     Cholecalciferol (VITAMIN D3) 50 MCG (2000 UT) capsule Take 1 capsule (2,000 Units total) by mouth daily.     Coenzyme Q10 200 MG capsule Take 200 mg by mouth daily.     denosumab -bbdz (JUBBONTI ) 60 MG/ML SOSY injection Inject 60 mg into the skin every 6 (six)  months. Dx code: M81.0.  pt scheduled for 03/12/24. 1 mL 0   ezetimibe  (ZETIA ) 10 MG tablet Take 1 tablet (10 mg total) by mouth daily. 90 tablet 1   fluticasone  (FLONASE ) 50 MCG/ACT nasal spray 1 spray each nostril following sinus rinses twice daily 16 g 2   hydrALAZINE  (APRESOLINE ) 25 MG tablet Take 1 tablet (25 mg total) by mouth 3 (three) times daily. 270 tablet 1   ibuprofen (ADVIL,MOTRIN) 200 MG tablet Take 400 mg by mouth daily.     Magnesium 400 MG TABS Take 400 mg by mouth daily.     metoprolol  succinate (TOPROL -XL) 50 MG 24 hr tablet Take 1.5 tablets (75 mg total) by mouth daily.Take with or immediately following a meal. 135 tablet 1   Multiple Vitamin (MULTIVITAMIN) tablet Take 1 tablet by mouth daily.      niacin  500 MG tablet Take 500 mg by mouth 2 (two) times daily with a meal.     Omega-3 Fatty Acids (FISH OIL) 1000 MG CAPS Take 1 capsule by mouth every morning. (Krill Oil)     omeprazole  (PRILOSEC) 40 MG capsule Take 1 capsule (40 mg total) by mouth daily. 90 capsule 1   sodium chloride  (MURO 128) 5 % ophthalmic solution Place 1 drop into both eyes 2 (two) to 3 (three) times daily. 15 mL 6   Current Facility-Administered Medications  Medication Dose Route Frequency Provider Last Rate Last Admin   [START ON 09/10/2024] denosumab -bbdz (JUBBONTI ) injection 60 mg  60 mg Subcutaneous Once O'Sullivan, [name], NP        Allergies as of 05/28/2024 - Review Complete 05/28/2024  Allergen Reaction Noted   Atorvastatin Other (See Comments) 11/21/2006   Diclofenac-misoprostol Diarrhea and Nausea And Vomiting 11/21/2006   Fenofibrate Other (See Comments) 12/14/2008   Rosuvastatin Other (See Comments) 11/21/2006   Statins Other (See Comments) 04/01/2008   Amoxicillin-pot clavulanate Other (See Comments) 07/04/2006   Penicillins Hives 07/05/2009   Sulfonamide derivatives Hives 11/21/2006   Tuberculin ppd Other (See Comments) 11/21/2006    Family History  Problem Relation Age of Onset   Coronary artery disease Mother    Arthritis Mother    High blood pressure Mother    Heart disease Mother    High blood pressure Father    Heart disease Father    Asthma Maternal Grandmother    Tuberculosis Other    Colon cancer Neg Hx    Esophageal cancer Neg Hx    Rectal cancer Neg Hx    Stomach cancer Neg Hx     Review of Systems:    Constitutional: No weight loss, fever, chills, weakness or fatigue HEENT: Eyes: No change in vision               Ears, Nose, Throat:  No change in hearing or congestion Skin: No rash or itching Cardiovascular: No chest pain, chest pressure or palpitations   Respiratory: No SOB or cough Gastrointestinal: See HPI and otherwise negative Genitourinary: No dysuria or change  in urinary frequency Neurological: No headache, dizziness or syncope Musculoskeletal: No new muscle or joint pain Hematologic: No bleeding or bruising Psychiatric: No history of depression or anxiety    Physical Exam:  Vital signs: BP 122/68   Pulse 63   Ht 4' 11 (1.499 m)   Wt 141 lb (64 kg)   LMP 05/14/1968   BMI 28.48 kg/m   Constitutional:   Pleasant  female appears to be in NAD, Well developed, Well nourished, alert and cooperative  Eyes:   PEERL, EOMI. No icterus. Conjunctiva pink. Neck:  Supple Throat: Oral cavity and pharynx without inflammation, swelling or lesion.  Respiratory: Respirations even and unlabored. Lungs clear to auscultation bilaterally.   No wheezes, crackles, or rhonchi.  Cardiovascular: Normal S1, S2. Regular rate and rhythm. No peripheral edema, cyanosis or pallor.  Gastrointestinal:  Soft, nondistended, nontender. No rebound or guarding. Normal bowel sounds. No appreciable masses or hepatomegaly. Rectal:  Not performed.  Msk:  Symmetrical without gross deformities. Without edema, no deformity or joint abnormality.  Neurologic:  Alert and  oriented x4;  grossly normal neurologically.  Skin:   Dry and intact without significant lesions or rashes.  RELEVANT LABS AND IMAGING: CBC    Latest Ref Rng & Units 09/05/2021    9:27 AM 08/29/2021   11:27 AM 11/18/2015   11:24 AM  CBC  WBC 4.0 - 10.5 K/uL 12.3  17.1  9.8   Hemoglobin 12.0 - 15.0 g/dL 86.9  86.4  85.6   Hematocrit 36.0 - 46.0 % 38.9  39.8  42.9   Platelets 150.0 - 400.0 K/uL 384.0  530  370.0      CMP     Latest Ref Rng & Units 02/12/2024    7:30 AM 08/09/2023    9:01 AM 02/08/2023    8:54 AM  CMP  Glucose 70 - 99 mg/dL 97  98  94   BUN 6 - 23 mg/dL 11  16  13    Creatinine 0.40 - 1.20 mg/dL 9.28  9.20  9.35   Sodium 135 - 145 mEq/L 135  138  136   Potassium 3.5 - 5.1 mEq/L 4.0  4.7  3.9   Chloride 96 - 112 mEq/L 103  105  103   CO2 19 - 32 mEq/L 24  25  24    Calcium  8.4 - 10.5 mg/dL 9.6   9.4  9.6   Total Protein 6.0 - 8.3 g/dL 6.2  6.5  6.6   Total Bilirubin 0.2 - 1.2 mg/dL 0.5  0.4  0.5   Alkaline Phos 39 - 117 U/L 35  39  58   AST 0 - 37 U/L 16  14  18    ALT 0 - 35 U/L 11  10  14       Lab Results  Component Value Date   TSH 3.530 08/29/2021  Echo 9/22: Left ventricular ejection fraction, by estimation, is 60 to 65%.   GI procedures 04/2014 EGD with Dr. Obie Nonobstructing esophageal stricture 3 cm nonreducible hiatal hernia History of Barrett's esophagus Path: Diagnosis Surgical Surgical [P], ge junction, junction, biopsy - INTESTINAL INTESTINAL METAPLASIA METAPLASIA (GOBLET (GOBLET CELL METAPLASIA) METAPLASIA) CONSISTENT CONSISTENT WITH BARRETT'S BARRETT'S ESOPHAGUS. - THERE IS NO EVIDENCE EVIDENCE OF DYSPLASIA DYSPLASIA OR MALIGNANCY  09/2007 EGD Normal examination Comments: Minimal atrial gastritis, no hiatal hernia or reflux esophagitis  03/2007 colonoscopy, no repeat due to age No recurrent polyps, mild diverticulosis   Assessment: Encounter Diagnoses  Name Primary?   Barrett's esophagus without dysplasia Yes   Esophageal dysphagia    84 year old female patient with history of GERD complicated with Barrett's esophagus who presents to schedule surveillance screening. Last EGD 12/20215 Barrett's esophagus with no dysplasia. She recently had increase in symptoms with intermittent eso dysphagia requiring increase in omeprazole  from 20 mg to 40 mg po daily which has helped. Will go ahead and schedule upper GI endoscopy with possible dilatation in [facility] with Dr. San.   Plan: -continue Omeprazole  40 mg po  daily -continue GERD diet, no late meals  -dysphagia precautions given  -schedule EGD with possible dilatation in [facility] with Dr. San. The risks and benefits of EGD with possible biopsies and esophageal dilation were discussed with the patient who agrees to proceed.   Thank you for the courtesy of this consult. Please call me with any questions  or concerns.   Kiyon Fidalgo, FNP-C [facility] 05/28/2024, 1:46 PM  Cc: Daryl Setter, NP  "

## 2024-05-28 NOTE — Patient Instructions (Addendum)
 GERD continue Omeprazole  40mg  po daily continue GERD diet, no late meals   Dysphagia Precautions on handout   You have been scheduled for an endoscopy. Please follow written instructions given to you at your visit today.  If you use inhalers (even only as needed), please bring them with you on the day of your procedure.  If you take any of the following medications, they will need to be adjusted prior to your procedure:   DO NOT TAKE 7 DAYS PRIOR TO TEST- Trulicity (dulaglutide) Ozempic, Wegovy (semaglutide) Mounjaro, Zepbound (tirzepatide) Bydureon Bcise (exanatide extended release)  DO NOT TAKE 1 DAY PRIOR TO YOUR TEST Rybelsus (semaglutide) Adlyxin (lixisenatide) Victoza (liraglutide) Byetta (exanatide) ___________________________________________________________________________  Due to recent changes in healthcare laws, you may see the results of your imaging and laboratory studies on MyChart before your provider has had a chance to review them.  We understand that in some cases there may be results that are confusing or concerning to you. Not all laboratory results come back in the same time frame and the provider may be waiting for multiple results in order to interpret others.  Please give us  48 hours in order for your provider to thoroughly review all the results before contacting the office for clarification of your results.   _______________________________________________________  If your blood pressure at your visit was 140/90 or greater, please contact your primary care physician to follow up on this.  _______________________________________________________  If you are age 84 or older, your body mass index should be between 23-30. Your Body mass index is 28.48 kg/m. If this is out of the aforementioned range listed, please consider follow up with your Primary Care Provider.  If you are age 84 or younger, your body mass index should be between 19-25. Your Body mass  index is 28.48 kg/m. If this is out of the aformentioned range listed, please consider follow up with your Primary Care Provider.   ________________________________________________________  The [facility] providers would like to encourage you to use MYCHART to communicate with providers for non-urgent requests or questions.  Due to long hold times on the telephone, sending your provider a message by [facility] may be a faster and more efficient way to get a response.  Please allow 48 business hours for a response.  Please remember that this is for non-urgent requests.  _______________________________________________________  [facility] is using a team-based approach to care.  Your team is made up of your doctor and two to three APPS. Our APPS (Nurse Practitioners and Physician Assistants) work with your physician to ensure care continuity for you. They are fully qualified to address your health concerns and develop a treatment plan. They communicate directly with your gastroenterologist to care for you. Seeing the Advanced Practice Practitioners on your physician's team can help you by facilitating care more promptly, often allowing for earlier appointments, access to diagnostic testing, procedures, and other specialty referrals.   Thank you for trusting me with your gastrointestinal care. Deanna May, FNP-C

## 2024-05-28 NOTE — Progress Notes (Signed)
 Agree with the assessment and plan as outlined by [facility], FNP-C.  Miranda Bottino, DO, [facility]

## 2024-06-09 ENCOUNTER — Encounter: Admitting: Gastroenterology

## 2024-06-17 ENCOUNTER — Other Ambulatory Visit (HOSPITAL_BASED_OUTPATIENT_CLINIC_OR_DEPARTMENT_OTHER): Payer: Self-pay

## 2024-06-17 ENCOUNTER — Other Ambulatory Visit: Payer: Self-pay | Admitting: Family

## 2024-06-17 ENCOUNTER — Other Ambulatory Visit: Payer: Self-pay

## 2024-06-17 MED ORDER — METOPROLOL SUCCINATE ER 50 MG PO TB24
75.0000 mg | ORAL_TABLET | Freq: Every day | ORAL | 1 refills | Status: AC
  Filled 2024-06-17: qty 135, 90d supply, fill #0

## 2024-06-29 ENCOUNTER — Ambulatory Visit (INDEPENDENT_AMBULATORY_CARE_PROVIDER_SITE_OTHER): Admitting: Family Medicine

## 2024-08-11 ENCOUNTER — Ambulatory Visit: Admitting: Family

## 2024-10-20 ENCOUNTER — Ambulatory Visit
